# Patient Record
Sex: Female | Born: 1945 | Race: White | Hispanic: No | Marital: Married | Smoking: Never smoker
Health system: Southern US, Community
[De-identification: ages and names within clinical notes are randomized; demographics above are authoritative.]

## PROBLEM LIST (undated history)

## (undated) ENCOUNTER — Emergency Department (HOSPITAL_BASED_OUTPATIENT_CLINIC_OR_DEPARTMENT_OTHER): Source: Home / Self Care

## (undated) DIAGNOSIS — T7840XA Allergy, unspecified, initial encounter: Secondary | ICD-10-CM

## (undated) DIAGNOSIS — G4700 Insomnia, unspecified: Secondary | ICD-10-CM

## (undated) DIAGNOSIS — Z8719 Personal history of other diseases of the digestive system: Secondary | ICD-10-CM

## (undated) DIAGNOSIS — I10 Essential (primary) hypertension: Secondary | ICD-10-CM

## (undated) DIAGNOSIS — J45909 Unspecified asthma, uncomplicated: Secondary | ICD-10-CM

## (undated) DIAGNOSIS — E039 Hypothyroidism, unspecified: Secondary | ICD-10-CM

## (undated) DIAGNOSIS — I499 Cardiac arrhythmia, unspecified: Secondary | ICD-10-CM

## (undated) DIAGNOSIS — K589 Irritable bowel syndrome without diarrhea: Secondary | ICD-10-CM

## (undated) DIAGNOSIS — Z9889 Other specified postprocedural states: Secondary | ICD-10-CM

## (undated) DIAGNOSIS — M436 Torticollis: Secondary | ICD-10-CM

## (undated) DIAGNOSIS — E785 Hyperlipidemia, unspecified: Secondary | ICD-10-CM

## (undated) DIAGNOSIS — K219 Gastro-esophageal reflux disease without esophagitis: Secondary | ICD-10-CM

## (undated) DIAGNOSIS — D649 Anemia, unspecified: Secondary | ICD-10-CM

## (undated) HISTORY — DX: Gastro-esophageal reflux disease without esophagitis: K21.9

## (undated) HISTORY — PX: CLEFT PALATE REPAIR: SUR1165

## (undated) HISTORY — DX: Unspecified asthma, uncomplicated: J45.909

## (undated) HISTORY — DX: Hypothyroidism, unspecified: E03.9

## (undated) HISTORY — DX: Cardiac arrhythmia, unspecified: I49.9

## (undated) HISTORY — DX: Insomnia, unspecified: G47.00

## (undated) HISTORY — DX: Essential (primary) hypertension: I10

## (undated) HISTORY — DX: Anemia, unspecified: D64.9

## (undated) HISTORY — DX: Other specified postprocedural states: Z98.890

## (undated) HISTORY — DX: Personal history of other diseases of the digestive system: Z87.19

## (undated) HISTORY — DX: Allergy, unspecified, initial encounter: T78.40XA

## (undated) HISTORY — PX: ABDOMINAL HYSTERECTOMY: SHX81

## (undated) HISTORY — DX: Hyperlipidemia, unspecified: E78.5

## (undated) HISTORY — PX: COLONOSCOPY: SHX174

## (undated) HISTORY — PX: CATARACT EXTRACTION: SUR2

## (undated) HISTORY — DX: Torticollis: M43.6

## (undated) HISTORY — DX: Irritable bowel syndrome, unspecified: K58.9

---

## 1983-07-20 HISTORY — PX: TUBAL LIGATION: SHX77

## 1997-12-30 ENCOUNTER — Ambulatory Visit (HOSPITAL_COMMUNITY): Admission: RE | Admit: 1997-12-30 | Discharge: 1997-12-30 | Payer: Self-pay | Admitting: Pulmonary Disease

## 1999-02-09 ENCOUNTER — Encounter: Payer: Self-pay | Admitting: Pulmonary Disease

## 1999-02-09 ENCOUNTER — Ambulatory Visit (HOSPITAL_COMMUNITY): Admission: RE | Admit: 1999-02-09 | Discharge: 1999-02-09 | Payer: Self-pay | Admitting: Pulmonary Disease

## 2000-02-15 ENCOUNTER — Encounter: Payer: Self-pay | Admitting: Pulmonary Disease

## 2000-02-15 ENCOUNTER — Ambulatory Visit (HOSPITAL_COMMUNITY): Admission: RE | Admit: 2000-02-15 | Discharge: 2000-02-15 | Payer: Self-pay | Admitting: Pulmonary Disease

## 2001-02-20 ENCOUNTER — Ambulatory Visit (HOSPITAL_COMMUNITY): Admission: RE | Admit: 2001-02-20 | Discharge: 2001-02-20 | Payer: Self-pay | Admitting: Internal Medicine

## 2001-02-20 ENCOUNTER — Encounter: Payer: Self-pay | Admitting: Internal Medicine

## 2003-02-25 ENCOUNTER — Encounter: Payer: Self-pay | Admitting: Gastroenterology

## 2003-02-25 ENCOUNTER — Ambulatory Visit (HOSPITAL_COMMUNITY): Admission: RE | Admit: 2003-02-25 | Discharge: 2003-02-25 | Payer: Self-pay | Admitting: Gastroenterology

## 2004-05-20 ENCOUNTER — Ambulatory Visit: Payer: Self-pay | Admitting: Gastroenterology

## 2004-07-19 DIAGNOSIS — M436 Torticollis: Secondary | ICD-10-CM

## 2004-07-19 HISTORY — DX: Torticollis: M43.6

## 2004-07-30 ENCOUNTER — Ambulatory Visit: Payer: Self-pay | Admitting: Gastroenterology

## 2004-08-12 ENCOUNTER — Ambulatory Visit: Payer: Self-pay | Admitting: Gastroenterology

## 2004-08-13 ENCOUNTER — Ambulatory Visit (HOSPITAL_COMMUNITY): Admission: RE | Admit: 2004-08-13 | Discharge: 2004-08-13 | Payer: Self-pay | Admitting: Internal Medicine

## 2005-04-21 ENCOUNTER — Ambulatory Visit: Payer: Self-pay | Admitting: Internal Medicine

## 2005-04-29 ENCOUNTER — Ambulatory Visit: Payer: Self-pay | Admitting: Internal Medicine

## 2005-05-14 ENCOUNTER — Ambulatory Visit: Payer: Self-pay | Admitting: Family Medicine

## 2005-07-15 ENCOUNTER — Ambulatory Visit: Payer: Self-pay | Admitting: Internal Medicine

## 2005-08-25 ENCOUNTER — Ambulatory Visit: Payer: Self-pay | Admitting: Internal Medicine

## 2005-09-21 ENCOUNTER — Ambulatory Visit: Payer: Self-pay | Admitting: Gastroenterology

## 2005-10-29 ENCOUNTER — Ambulatory Visit: Payer: Self-pay | Admitting: Gastroenterology

## 2005-11-22 ENCOUNTER — Ambulatory Visit: Payer: Self-pay | Admitting: Internal Medicine

## 2006-05-17 ENCOUNTER — Ambulatory Visit: Payer: Self-pay | Admitting: Internal Medicine

## 2006-05-17 LAB — CONVERTED CEMR LAB
ALT: 18 units/L (ref 0–40)
AST: 21 units/L (ref 0–37)
Albumin: 3.8 g/dL (ref 3.5–5.2)
Alkaline Phosphatase: 96 units/L (ref 39–117)
BUN: 18 mg/dL (ref 6–23)
Bacteria, U Microscopic: NEGATIVE /hpf
Basophils Absolute: 0.1 10*3/uL (ref 0.0–0.1)
Basophils Relative: 0.8 % (ref 0.0–1.0)
Bilirubin Urine: NEGATIVE
CO2: 29 meq/L (ref 19–32)
Calcium: 9.5 mg/dL (ref 8.4–10.5)
Chloride: 104 meq/L (ref 96–112)
Chol/HDL Ratio, serum: 3.5
Cholesterol: 180 mg/dL (ref 0–200)
Creatinine, Ser: 0.8 mg/dL (ref 0.4–1.2)
Crystals: NEGATIVE
Eosinophil percent: 4.3 % (ref 0.0–5.0)
GFR calc non Af Amer: 78 mL/min
Glomerular Filtration Rate, Af Am: 94 mL/min/{1.73_m2}
Glucose, Bld: 86 mg/dL (ref 70–99)
HCT: 39 % (ref 36.0–46.0)
HDL: 50.9 mg/dL (ref >39.0)
Hemoglobin, Urine: NEGATIVE
Hemoglobin: 13.2 g/dL (ref 12.0–15.0)
Ketones, ur: NEGATIVE mg/dL
LDL Cholesterol: 115 mg/dL — ABNORMAL HIGH (ref 0–99)
Lymphocytes Relative: 29.9 % (ref 12.0–46.0)
MCHC: 33.8 g/dL (ref 30.0–36.0)
MCV: 91.6 fL (ref 78.0–100.0)
Monocytes Absolute: 0.8 10*3/uL — ABNORMAL HIGH (ref 0.2–0.7)
Monocytes Relative: 9.2 % (ref 3.0–11.0)
Mucus, UA: NEGATIVE
Neutro Abs: 4.7 10*3/uL (ref 1.4–7.7)
Neutrophils Relative %: 55.8 % (ref 43.0–77.0)
Nitrite: NEGATIVE
Platelets: 355 10*3/uL (ref 150–400)
Potassium: 4.3 meq/L (ref 3.5–5.1)
RBC / HPF: NONE SEEN
RBC: 4.26 M/uL (ref 3.87–5.11)
RDW: 12.9 % (ref 11.5–14.6)
Sodium: 140 meq/L (ref 135–145)
Specific Gravity, Urine: 1.015 (ref 1.000–1.03)
TSH: 3.3 microintl units/mL (ref 0.35–5.50)
Total Bilirubin: 0.6 mg/dL (ref 0.3–1.2)
Total Protein, Urine: NEGATIVE mg/dL
Total Protein: 6.9 g/dL (ref 6.0–8.3)
Triglyceride fasting, serum: 72 mg/dL (ref 0–149)
Urine Glucose: NEGATIVE mg/dL
Urobilinogen, UA: 0.2 (ref 0.0–1.0)
VLDL: 14 mg/dL (ref 0–40)
WBC: 8.5 10*3/uL (ref 4.5–10.5)
pH: 6.5 (ref 5.0–8.0)

## 2006-05-25 ENCOUNTER — Ambulatory Visit: Payer: Self-pay | Admitting: Internal Medicine

## 2006-10-25 ENCOUNTER — Ambulatory Visit: Payer: Self-pay | Admitting: Gastroenterology

## 2007-07-17 ENCOUNTER — Encounter: Payer: Self-pay | Admitting: Internal Medicine

## 2007-07-17 ENCOUNTER — Ambulatory Visit: Payer: Self-pay | Admitting: Internal Medicine

## 2007-07-20 DIAGNOSIS — J45909 Unspecified asthma, uncomplicated: Secondary | ICD-10-CM

## 2007-07-20 HISTORY — DX: Unspecified asthma, uncomplicated: J45.909

## 2007-08-01 ENCOUNTER — Encounter: Payer: Self-pay | Admitting: Internal Medicine

## 2007-08-18 ENCOUNTER — Encounter: Payer: Self-pay | Admitting: Internal Medicine

## 2008-02-08 ENCOUNTER — Telehealth: Payer: Self-pay | Admitting: Internal Medicine

## 2008-03-08 ENCOUNTER — Ambulatory Visit: Payer: Self-pay | Admitting: Internal Medicine

## 2008-03-08 LAB — CONVERTED CEMR LAB
ALT: 14 units/L (ref 0–35)
AST: 22 units/L (ref 0–37)
Albumin: 3.7 g/dL (ref 3.5–5.2)
Alkaline Phosphatase: 94 units/L (ref 39–117)
BUN: 19 mg/dL (ref 6–23)
Basophils Absolute: 0.1 10*3/uL (ref 0.0–0.1)
Basophils Relative: 1.4 % (ref 0.0–3.0)
Bilirubin Urine: NEGATIVE
Bilirubin, Direct: 0.1 mg/dL (ref 0.0–0.3)
CO2: 28 meq/L (ref 19–32)
Calcium: 9.1 mg/dL (ref 8.4–10.5)
Chloride: 106 meq/L (ref 96–112)
Cholesterol: 178 mg/dL (ref 0–200)
Creatinine, Ser: 0.7 mg/dL (ref 0.4–1.2)
Eosinophils Absolute: 0.2 10*3/uL (ref 0.0–0.7)
Eosinophils Relative: 2.3 % (ref 0.0–5.0)
GFR calc Af Amer: 109 mL/min
GFR calc non Af Amer: 90 mL/min
Glucose, Bld: 92 mg/dL (ref 70–99)
HCT: 37.8 % (ref 36.0–46.0)
HDL: 47.1 mg/dL (ref >39.0)
Hemoglobin, Urine: NEGATIVE
Hemoglobin: 12.9 g/dL (ref 12.0–15.0)
Ketones, ur: NEGATIVE mg/dL
LDL Cholesterol: 116 mg/dL — ABNORMAL HIGH (ref 0–99)
Leukocytes, UA: NEGATIVE
Lymphocytes Relative: 34.7 % (ref 12.0–46.0)
MCHC: 34.2 g/dL (ref 30.0–36.0)
MCV: 92.4 fL (ref 78.0–100.0)
Monocytes Absolute: 0.7 10*3/uL (ref 0.1–1.0)
Monocytes Relative: 9.2 % (ref 3.0–12.0)
Neutro Abs: 3.8 10*3/uL (ref 1.4–7.7)
Neutrophils Relative %: 52.4 % (ref 43.0–77.0)
Nitrite: NEGATIVE
Platelets: 310 10*3/uL (ref 150–400)
Potassium: 3.9 meq/L (ref 3.5–5.1)
RBC: 4.1 M/uL (ref 3.87–5.11)
RDW: 12.5 % (ref 11.5–14.6)
Sodium: 141 meq/L (ref 135–145)
Specific Gravity, Urine: 1.015 (ref 1.000–1.03)
TSH: 0.92 microintl units/mL (ref 0.35–5.50)
Total Bilirubin: 0.7 mg/dL (ref 0.3–1.2)
Total CHOL/HDL Ratio: 3.8
Total Protein, Urine: NEGATIVE mg/dL
Total Protein: 6.6 g/dL (ref 6.0–8.3)
Triglycerides: 73 mg/dL (ref 0–149)
Urine Glucose: NEGATIVE mg/dL
Urobilinogen, UA: 0.2 (ref 0.0–1.0)
VLDL: 15 mg/dL (ref 0–40)
WBC: 7.4 10*3/uL (ref 4.5–10.5)
pH: 6 (ref 5.0–8.0)

## 2008-03-15 ENCOUNTER — Ambulatory Visit: Payer: Self-pay | Admitting: Internal Medicine

## 2008-03-15 DIAGNOSIS — E039 Hypothyroidism, unspecified: Secondary | ICD-10-CM | POA: Insufficient documentation

## 2008-03-15 DIAGNOSIS — J45909 Unspecified asthma, uncomplicated: Secondary | ICD-10-CM | POA: Insufficient documentation

## 2008-03-15 DIAGNOSIS — J309 Allergic rhinitis, unspecified: Secondary | ICD-10-CM | POA: Insufficient documentation

## 2008-03-15 DIAGNOSIS — G4700 Insomnia, unspecified: Secondary | ICD-10-CM | POA: Insufficient documentation

## 2008-03-15 DIAGNOSIS — E785 Hyperlipidemia, unspecified: Secondary | ICD-10-CM | POA: Insufficient documentation

## 2008-03-15 DIAGNOSIS — K219 Gastro-esophageal reflux disease without esophagitis: Secondary | ICD-10-CM | POA: Insufficient documentation

## 2008-04-02 ENCOUNTER — Telehealth: Payer: Self-pay | Admitting: Internal Medicine

## 2008-05-31 ENCOUNTER — Emergency Department (HOSPITAL_COMMUNITY): Admission: EM | Admit: 2008-05-31 | Discharge: 2008-05-31 | Payer: Self-pay | Admitting: Emergency Medicine

## 2008-07-17 ENCOUNTER — Encounter: Payer: Self-pay | Admitting: Internal Medicine

## 2008-09-13 ENCOUNTER — Ambulatory Visit: Payer: Self-pay | Admitting: Internal Medicine

## 2008-09-13 LAB — CONVERTED CEMR LAB
ALT: 16 units/L (ref 0–35)
AST: 23 units/L (ref 0–37)
Cholesterol: 243 mg/dL (ref 0–200)
Direct LDL: 158.6 mg/dL
HDL: 54.2 mg/dL (ref >39.0)
TSH: 3.31 microintl units/mL (ref 0.35–5.50)
Total CHOL/HDL Ratio: 4.5
Triglycerides: 108 mg/dL (ref 0–149)
VLDL: 22 mg/dL (ref 0–40)

## 2009-01-07 ENCOUNTER — Encounter: Payer: Self-pay | Admitting: Internal Medicine

## 2009-03-17 ENCOUNTER — Ambulatory Visit: Payer: Self-pay | Admitting: Internal Medicine

## 2009-03-17 LAB — CONVERTED CEMR LAB
ALT: 17 units/L (ref 0–35)
AST: 23 units/L (ref 0–37)
Albumin: 4.2 g/dL (ref 3.5–5.2)
Alkaline Phosphatase: 112 units/L (ref 39–117)
BUN: 24 mg/dL — ABNORMAL HIGH (ref 6–23)
Basophils Absolute: 0 10*3/uL (ref 0.0–0.1)
Basophils Relative: 0.1 % (ref 0.0–3.0)
Bilirubin Urine: NEGATIVE
Bilirubin, Direct: 0 mg/dL (ref 0.0–0.3)
CO2: 32 meq/L (ref 19–32)
Calcium: 9.6 mg/dL (ref 8.4–10.5)
Chloride: 103 meq/L (ref 96–112)
Cholesterol: 218 mg/dL — ABNORMAL HIGH (ref 0–200)
Creatinine, Ser: 0.8 mg/dL (ref 0.4–1.2)
Direct LDL: 137.5 mg/dL
Eosinophils Absolute: 0.1 10*3/uL (ref 0.0–0.7)
Eosinophils Relative: 2 % (ref 0.0–5.0)
GFR calc non Af Amer: 76.83 mL/min (ref >60)
Glucose, Bld: 89 mg/dL (ref 70–99)
HCT: 40.5 % (ref 36.0–46.0)
HDL: 49.3 mg/dL (ref >39.00)
Hemoglobin, Urine: NEGATIVE
Hemoglobin: 13.6 g/dL (ref 12.0–15.0)
Ketones, ur: NEGATIVE mg/dL
Leukocytes, UA: NEGATIVE
Lymphocytes Relative: 29.6 % (ref 12.0–46.0)
Lymphs Abs: 2 10*3/uL (ref 0.7–4.0)
MCHC: 33.5 g/dL (ref 30.0–36.0)
MCV: 92.8 fL (ref 78.0–100.0)
Monocytes Absolute: 0.5 10*3/uL (ref 0.1–1.0)
Monocytes Relative: 7.7 % (ref 3.0–12.0)
Neutro Abs: 4.3 10*3/uL (ref 1.4–7.7)
Neutrophils Relative %: 60.6 % (ref 43.0–77.0)
Nitrite: NEGATIVE
Platelets: 266 10*3/uL (ref 150.0–400.0)
Potassium: 4.9 meq/L (ref 3.5–5.1)
RBC: 4.37 M/uL (ref 3.87–5.11)
RDW: 12.3 % (ref 11.5–14.6)
Sodium: 140 meq/L (ref 135–145)
Specific Gravity, Urine: <=1.005 (ref 1.000–1.030)
TSH: 2.97 microintl units/mL (ref 0.35–5.50)
Total Bilirubin: 0.9 mg/dL (ref 0.3–1.2)
Total CHOL/HDL Ratio: 4
Total Protein, Urine: NEGATIVE mg/dL
Total Protein: 8.2 g/dL (ref 6.0–8.3)
Triglycerides: 91 mg/dL (ref 0.0–149.0)
Urine Glucose: NEGATIVE mg/dL
Urobilinogen, UA: 0.2 (ref 0.0–1.0)
VLDL: 18.2 mg/dL (ref 0.0–40.0)
WBC: 6.9 10*3/uL (ref 4.5–10.5)
pH: 6.5 (ref 5.0–8.0)

## 2009-03-18 ENCOUNTER — Ambulatory Visit: Payer: Self-pay | Admitting: Internal Medicine

## 2009-03-26 ENCOUNTER — Telehealth: Payer: Self-pay | Admitting: Internal Medicine

## 2009-04-30 ENCOUNTER — Emergency Department (HOSPITAL_COMMUNITY): Admission: EM | Admit: 2009-04-30 | Discharge: 2009-04-30 | Payer: Self-pay | Admitting: Family Medicine

## 2009-09-01 ENCOUNTER — Telehealth: Payer: Self-pay | Admitting: Internal Medicine

## 2009-09-15 ENCOUNTER — Ambulatory Visit: Payer: Self-pay | Admitting: Internal Medicine

## 2009-10-20 ENCOUNTER — Ambulatory Visit: Payer: Self-pay | Admitting: Internal Medicine

## 2009-10-20 DIAGNOSIS — M2550 Pain in unspecified joint: Secondary | ICD-10-CM | POA: Insufficient documentation

## 2009-10-20 DIAGNOSIS — J069 Acute upper respiratory infection, unspecified: Secondary | ICD-10-CM | POA: Insufficient documentation

## 2009-10-20 DIAGNOSIS — R531 Weakness: Secondary | ICD-10-CM | POA: Insufficient documentation

## 2009-10-23 LAB — CONVERTED CEMR LAB
ALT: 25 units/L (ref 0–35)
AST: 25 units/L (ref 0–37)
Albumin: 3.8 g/dL (ref 3.5–5.2)
Alkaline Phosphatase: 115 units/L (ref 39–117)
BUN: 18 mg/dL (ref 6–23)
Basophils Absolute: 0 10*3/uL (ref 0.0–0.1)
Basophils Relative: 0.5 % (ref 0.0–3.0)
Bilirubin Urine: NEGATIVE
Bilirubin, Direct: 0 mg/dL (ref 0.0–0.3)
CO2: 29 meq/L (ref 19–32)
Calcium: 9 mg/dL (ref 8.4–10.5)
Chloride: 103 meq/L (ref 96–112)
Creatinine, Ser: 0.8 mg/dL (ref 0.4–1.2)
Eosinophils Absolute: 0.2 10*3/uL (ref 0.0–0.7)
Eosinophils Relative: 1.7 % (ref 0.0–5.0)
GFR calc non Af Amer: 76.68 mL/min (ref >60)
Glucose, Bld: 97 mg/dL (ref 70–99)
HCT: 35.3 % — ABNORMAL LOW (ref 36.0–46.0)
Hemoglobin: 12.2 g/dL (ref 12.0–15.0)
Ketones, ur: NEGATIVE mg/dL
Lymphocytes Relative: 21.2 % (ref 12.0–46.0)
Lymphs Abs: 2 10*3/uL (ref 0.7–4.0)
MCHC: 34.7 g/dL (ref 30.0–36.0)
MCV: 91.1 fL (ref 78.0–100.0)
Monocytes Absolute: 1 10*3/uL (ref 0.1–1.0)
Monocytes Relative: 10.7 % (ref 3.0–12.0)
Neutro Abs: 6.3 10*3/uL (ref 1.4–7.7)
Neutrophils Relative %: 65.9 % (ref 43.0–77.0)
Nitrite: NEGATIVE
Platelets: 272 10*3/uL (ref 150.0–400.0)
Potassium: 4.4 meq/L (ref 3.5–5.1)
RBC: 3.87 M/uL (ref 3.87–5.11)
RDW: 13 % (ref 11.5–14.6)
Sed Rate: 45 mm/hr — ABNORMAL HIGH (ref 0–22)
Sodium: 141 meq/L (ref 135–145)
Specific Gravity, Urine: >=1.03 (ref 1.000–1.030)
TSH: 1.81 microintl units/mL (ref 0.35–5.50)
Total Bilirubin: 0.4 mg/dL (ref 0.3–1.2)
Total CK: 68 units/L (ref 7–177)
Total Protein, Urine: NEGATIVE mg/dL
Total Protein: 7.2 g/dL (ref 6.0–8.3)
Urine Glucose: NEGATIVE mg/dL
Urobilinogen, UA: 1 (ref 0.0–1.0)
Vitamin B-12: 837 pg/mL (ref 211–911)
WBC: 9.5 10*3/uL (ref 4.5–10.5)
pH: 5.5 (ref 5.0–8.0)

## 2010-01-07 ENCOUNTER — Encounter: Payer: Self-pay | Admitting: Internal Medicine

## 2010-03-17 ENCOUNTER — Ambulatory Visit: Payer: Self-pay | Admitting: Internal Medicine

## 2010-03-17 ENCOUNTER — Ambulatory Visit: Payer: Self-pay | Admitting: Family Medicine

## 2010-03-24 ENCOUNTER — Encounter: Payer: Self-pay | Admitting: Internal Medicine

## 2010-03-24 ENCOUNTER — Ambulatory Visit: Payer: Self-pay | Admitting: Internal Medicine

## 2010-03-24 DIAGNOSIS — M25559 Pain in unspecified hip: Secondary | ICD-10-CM | POA: Insufficient documentation

## 2010-03-30 ENCOUNTER — Telehealth: Payer: Self-pay | Admitting: Internal Medicine

## 2010-03-31 ENCOUNTER — Encounter: Payer: Self-pay | Admitting: Gastroenterology

## 2010-04-03 ENCOUNTER — Telehealth: Payer: Self-pay | Admitting: Gastroenterology

## 2010-08-16 LAB — CONVERTED CEMR LAB
AST: 21 units/L (ref 0–37)
Albumin: 3.9 g/dL (ref 3.5–5.2)
Alkaline Phosphatase: 105 units/L (ref 39–117)
Basophils Absolute: 0.1 10*3/uL (ref 0.0–0.1)
Calcium: 9.4 mg/dL (ref 8.4–10.5)
Eosinophils Relative: 2.7 % (ref 0.0–5.0)
GFR calc non Af Amer: 85.12 mL/min (ref >60)
Glucose, Bld: 80 mg/dL (ref 70–99)
HDL: 50.1 mg/dL (ref >39.00)
Hemoglobin: 12.4 g/dL (ref 12.0–15.0)
Ketones, ur: NEGATIVE mg/dL
LDL Cholesterol: 127 mg/dL — ABNORMAL HIGH (ref 0–99)
Lymphocytes Relative: 30.1 % (ref 12.0–46.0)
Monocytes Relative: 9.3 % (ref 3.0–12.0)
Neutro Abs: 4.5 10*3/uL (ref 1.4–7.7)
RBC: 3.91 M/uL (ref 3.87–5.11)
RDW: 14 % (ref 11.5–14.6)
Sodium: 141 meq/L (ref 135–145)
Specific Gravity, Urine: 1.015 (ref 1.000–1.030)
Total Bilirubin: 0.5 mg/dL (ref 0.3–1.2)
Total CHOL/HDL Ratio: 4
Urine Glucose: NEGATIVE mg/dL
VLDL: 16 mg/dL (ref 0.0–40.0)
WBC: 7.9 10*3/uL (ref 4.5–10.5)
pH: 7 (ref 5.0–8.0)

## 2010-08-20 NOTE — Miscellaneous (Signed)
Summary: BONE DENSITY  Clinical Lists Changes  Orders: Added new Test order of T-Bone Densitometry (77080) - Signed Added new Test order of T-Lumbar Vertebral Assessment (77082) - Signed 

## 2010-08-20 NOTE — Letter (Signed)
Summary: Pre Visit Letter Revised  [facility]  [address] [place], [place] [address]   Phone: [phone]  Fax: [phone]        03/31/2010 MRN: [id number] Cindy Simpson [address], [place]  [address]             Procedure Date:  Oct 31 at 9:30am   Welcome to the Gastroenterology Division at [facility].    You are scheduled to see a nurse for your pre-procedure visit on May 01, 2010 at 10:30am on the 3rd floor at [facility], [address]  We ask that you try to arrive at our office 15 minutes prior to your appointment time to allow for check-in.  Please take a minute to review the attached form.  If you answer "Yes" to one or more of the questions on the first page, we ask that you call the person listed at your earliest opportunity.  If you answer "No" to all of the questions, please complete the rest of the form and bring it to your appointment.    Your nurse visit will consist of discussing your medical and surgical history, your immediate family medical history, and your medications.   If you are unable to list all of your medications on the form, please bring the medication bottles to your appointment and we will list them.  We will need to be aware of both prescribed and over the counter drugs.  We will need to know exact dosage information as well.    Please be prepared to read and sign documents such as consent forms, a financial agreement, and acknowledgement forms.  If necessary, and with your consent, a friend or relative is welcome to sit-in on the nurse visit with you.  Please bring your insurance card so that we may make a copy of it.  If your insurance requires a referral to see a specialist, please bring your referral form from your primary care physician.  No co-pay is required for this nurse visit.     If you cannot keep your appointment, please call [phone] to cancel or reschedule prior to your appointment  date.  This allows [place] the opportunity to schedule an appointment for another patient in need of care.    Thank you for choosing [facility] for your medical needs.  We appreciate the opportunity to care for you.  Please visit [place] at our website  to learn more about our practice.  Sincerely, The Gastroenterology Division

## 2010-08-20 NOTE — Therapy (Signed)
Summary: [facility]   [facility]   Imported By: Sherian Rein 01/26/2010 11:51:30  _____________________________________________________________________  External Attachment:    Type:   Image     Comment:   External Document

## 2010-08-20 NOTE — Assessment & Plan Note (Signed)
Summary: CONGESTION/HEAD  STC   Vital Signs:  Patient profile:   65 year old female Height:      63 inches Weight:      161.75 pounds BMI:     28.76 O2 Sat:      96 % on Room air Temp:     97.2 degrees F oral Pulse rate:   78 / minute BP sitting:   104 / 60  (left arm) Cuff size:   large  Vitals Entered By: Lucious Groves (October 20, 2009 2:47 PM)  O2 Flow:  Room air CC: C/O congestion/head cold x 4 day swith sore throat./kb Is Patient Diabetic? No Pain Assessment Patient in pain? no      Comments Patient denies mucous production and states that OTC [facility] cold med has not helped./kb   CC:  C/O congestion/head cold x 4 day swith sore throat./kb.  History of Present Illness: The patient presents with complaints of sore throat, fever,Muscle aches are present.  The mucus is colored. C/o fatigue, aches, R shoulder pain x 3-4 d   Current Medications (verified): 1)  Lovastatin 40 Mg  Tabs (Lovastatin) .... Once Daily 2)  Cartia Xt 240 Mg  Cp24 (Diltiazem Hcl Coated Beads) .... Once Daily 3)  Unithroid 75 Mcg  Tabs (Levothyroxine Sodium) .... Once Daily 4)  Vitamin D3 1000 Unit  Tabs (Cholecalciferol) .Marland Kitchen.. 1 By Mouth Daily 5)  Alprazolam 0.25 Mg Tabs (Alprazolam) .Marland Kitchen.. 1 By Mouth At [facility] Prn 6)  Aspirin 325 Mg Tabs (Aspirin) .... Once Daily 7)  Calcium 600 600 Mg Tabs (Calcium Carbonate) .... Once Daily 8)  Omnaris 50 Mcg/act Susp (Ciclesonide) .Marland Kitchen.. 1 Inh Once Daily Each Nostr 9)  Omeprazole 40 Mg Cpdr (Omeprazole) .Marland Kitchen.. 1 By Mouth Bid  Allergies (verified): 1)  ! Reglan (Metoclopramide Hcl)  Past History:  Past Medical History: Last updated: 03/15/2008 GERD Hyperlipidemia Hypothyroidism Allergic rhinitis IBS Asthmatic bronchitis 2009 Insomnia  Social History: Last updated: 10/20/2009 Married Never Smoked Retired  Social History: Married Never Smoked Retired  Review of Systems       The patient complains of dyspnea on exertion.  The patient denies fever, chest  pain, and syncope.         Fatigue  Physical Exam  General:  Well-developed,well-nourished,in no acute distress; alert,appropriate and cooperative throughout examination Ears:  External ear exam shows no significant lesions or deformities.  Otoscopic examination reveals clear canals, tympanic membranes are intact bilaterally without bulging, retraction, inflammation or discharge. Hearing is grossly normal bilaterally. Mouth:  Erythematous throat mucosa and intranasal erythema.  Neck:  No deformities, masses, or tenderness noted. Lungs:  Normal respiratory effort, chest expands symmetrically. Lungs are clear to auscultation, no crackles or wheezes. Heart:  Normal rate and regular rhythm. S1 and S2 normal without gallop, murmur, click, rub or other extra sounds. Abdomen:  Bowel sounds positive,abdomen soft and non-tender without masses, organomegaly or hernias noted. Msk:  R subacrom area is tender Neurologic:  No cranial nerve deficits noted. Station and gait are normal. Plantar reflexes are down-going bilaterally. DTRs are symmetrical throughout. Sensory, motor and coordinative functions appear intact. Skin:  Intact without suspicious lesions or rashes Psych:  Cognition and judgment appear intact. Alert and cooperative with normal attention span and concentration. No apparent delusions, illusions, hallucinations   Impression & Recommendations:  Problem # 1:  SHOULDER PAIN (ICD-719.41) R - subacr bursitis Assessment New Will inject if not better Her updated medication list for this problem includes:  Aspirin 325 Mg Tabs (Aspirin) ..... Once daily  Problem # 2:  ARTHRALGIA (ICD-719.40) Assessment: New See "Patient Instructions".  Orders: TLB-B12, Serum-Total ONLY (51884-Z66) TLB-BMP (Basic Metabolic Panel-BMET) (80048-METABOL) TLB-Hepatic/Liver Function Pnl (80076-HEPATIC) TLB-TSH (Thyroid Stimulating Hormone) (84443-TSH) TLB-Udip ONLY (81003-UDIP) T-CK Total  (06301-60109) TLB-CBC Platelet - w/Differential (85025-CBCD) TLB-Sedimentation Rate (ESR) (85652-ESR)  Problem # 3:  FATIGUE (ICD-780.79) Assessment: Deteriorated  Orders: TLB-B12, Serum-Total ONLY (32355-D32) TLB-BMP (Basic Metabolic Panel-BMET) (80048-METABOL) TLB-Hepatic/Liver Function Pnl (80076-HEPATIC) TLB-TSH (Thyroid Stimulating Hormone) (84443-TSH) TLB-Udip ONLY (81003-UDIP) T-CK Total (20254-27062) TLB-CBC Platelet - w/Differential (85025-CBCD) TLB-Sedimentation Rate (ESR) (85652-ESR)  Problem # 4:  UPPER RESPIRATORY INFECTION, ACUTE (ICD-465.9) Assessment: New  Her updated medication list for this problem includes:    Aspirin 325 Mg Tabs (Aspirin) ..... Once daily    Loratadine 10 Mg Tabs (Loratadine) .Marland Kitchen... 1 by mouth once daily as needed allergies  Complete Medication List: 1)  Lovastatin 40 Mg Tabs (Lovastatin) .... Once daily 2)  Cartia Xt 240 Mg Cp24 (Diltiazem hcl coated beads) .... Once daily 3)  Unithroid 75 Mcg Tabs (Levothyroxine sodium) .... Once daily 4)  Vitamin D3 1000 Unit Tabs (Cholecalciferol) .Marland Kitchen.. 1 by mouth daily 5)  Alprazolam 0.25 Mg Tabs (Alprazolam) .Marland Kitchen.. 1 by mouth at hs prn 6)  Aspirin 325 Mg Tabs (Aspirin) .... Once daily 7)  Calcium 600 600 Mg Tabs (Calcium carbonate) .... Once daily 8)  Omnaris 50 Mcg/act Susp (Ciclesonide) .Marland Kitchen.. 1 inh once daily each nostr 9)  Omeprazole 40 Mg Cpdr (Omeprazole) .Marland Kitchen.. 1 by mouth bid 10)  Loratadine 10 Mg Tabs (Loratadine) .Marland Kitchen.. 1 by mouth once daily as needed allergies 11)  Zithromax Z-pak 250 Mg Tabs (Azithromycin) .... As dirrected  Patient Instructions: 1)  Hold Lovastatin x 1 -2 mo   - it can make you ache 2)  Please schedule a follow-up appointment in 2 months. Prescriptions: ZITHROMAX Z-PAK 250 MG TABS (AZITHROMYCIN) as dirrected  #1 x 0   Entered and Authorized by:   Tresa Garter MD   Signed by:   Tresa Garter MD on 10/20/2009   Method used:   [facility]        [phone][facility] (retail)       [address]       [place], [place]  [address]       Ph: [phone] or [phone]       Fax: [phone]   RxID:   [id number] LORATADINE 10 MG TABS (LORATADINE) 1 by mouth once daily as needed allergies  #30 x 6   Entered and Authorized by:   Tresa Garter MD   Signed by:   Tresa Garter MD on 10/20/2009   Method used:   [facility] to        [phone][facility] (retail)       [address]       [place], [place]  [address]       Ph: [phone] or [phone]       Fax: [phone]   RxID:   [id number]   Appended Document: Orders Update    Clinical Lists Changes  Orders: Added new Test order of TLB-CK Total Only(Creatine Kinase/CPK) (82550-CK) - Signed

## 2010-08-20 NOTE — Assessment & Plan Note (Signed)
Summary: PER PT TETNUS -D/T--AVP--STC  Nurse Visit   Allergies: 1)  ! Reglan (Metoclopramide Hcl)  Immunizations Administered:  Tetanus Vaccine:    Vaccine Type: Tdap    Site: left deltoid    Mfr: GlaxoSmithKline    Dose: 0.5 ml    Route: IM    Given by: Lamar Sprinkles, CMA    Exp. Date: 10/11/2011    Lot #: [id number]    VIS given: 06/06/07 version given September 15, 2009.  Orders Added: 1)  Tdap => 80yrs IM [90715] 2)  Admin 1st Vaccine [65784]

## 2010-08-20 NOTE — Progress Notes (Signed)
Summary: Colonoscopy  Phone Note Outgoing Call   Call placed by: Lanier Prude, [facility]),  March 30, 2010 11:41 AM Summary of Call: pt advised last colon was 02/2001 and repeat was due 2007. I informed her to sched appt with Dr. Jarold Motto or we could do it for her. She states she will contact Dr. Norval Gable office. Initial call taken by: Lanier Prude, [facility]),  March 30, 2010 11:43 AM

## 2010-08-20 NOTE — Progress Notes (Signed)
Summary: COL ?'s  Phone Note Call from Patient Call back at [facility] Phone [phone]   Caller: Patient Call For: Dr. Jarold Motto Reason for Call: Talk to Nurse Summary of Call: pt has questions regarding the questionaire for previsit... asked to speak to [facility] Initial call taken by: Vallarie Mare,  April 03, 2010 2:03 PM  Follow-up for Phone Call        patient states that she called bc she checked Yes to the question of have you had a colonoscopy she has had one with Dr. Jarold Motto but i do not see a recall in the IDX so I have requested her chart and I will call her when we have an answer to if she is due for a recall.  Follow-up by: Harlow Mares CMA [facility]),  April 03, 2010 2:31 PM  Additional Follow-up for Phone Call Additional follow up Details #1::        i reviewed the chart and per Dr. Maris Berger recall sheet the patient is not due for a colonoscopy, I have notified the patient and cxed her procedure and previsit.  Additional Follow-up by: Harlow Mares CMA [facility]),  April 03, 2010 4:35 PM

## 2010-08-20 NOTE — Progress Notes (Signed)
Summary: Alprazolam  Phone Note Refill Request Message from:  Fax from Pharmacy on September 01, 2009 1:39 PM  Refills Requested: Medication #1:  ALPRAZOLAM 0.25 MG TABS 1 by mouth at hs prn   Notes[facility] [phone] Next Appointment Scheduled: 09-15-2009 Initial call taken by: Lucious Groves,  September 01, 2009 1:39 PM  Follow-up for Phone Call        OK x6 Follow-up by: Tresa Garter MD,  September 01, 2009 3:45 PM  Additional Follow-up for Phone Call Additional follow up Details #1::        rx faxed to mail order company Additional Follow-up by: Rock Nephew CMA,  September 02, 2009 2:04 PM    Prescriptions: ALPRAZOLAM 0.25 MG TABS (ALPRAZOLAM) 1 by mouth at hs prn  #90 x 1   Entered by:   Lamar Sprinkles, CMA   Authorized by:   Tresa Garter MD   Signed by:   Lamar Sprinkles, CMA on 09/01/2009   Method used:   Print then Give to Patient   RxID:   [id number]

## 2010-08-20 NOTE — Assessment & Plan Note (Signed)
Summary: CPX-LB   Vital Signs:  Patient profile:   65 year old female Height:      63 inches Weight:      158 pounds BMI:     28.09 O2 Sat:      95 % on Room air Temp:     98.6 degrees F oral Pulse rate:   73 / minute Pulse rhythm:   regular Resp:     16 per minute BP sitting:   118 / 70  (left arm) Cuff size:   regular  Vitals Entered By: Lanier Prude, CMA(AAMA) (March 24, 2010 8:32 AM)  O2 Flow:  Room air CC: CPX Is Patient Diabetic? No   CC:  CPX.  History of Present Illness: The patient presents for a preventive health examination  C/o L hip pain after a fall on Sun - tripped  Current Medications (verified): 1)  Lovastatin 40 Mg  Tabs (Lovastatin) .... Once Daily 2)  Cartia Xt 240 Mg  Cp24 (Diltiazem Hcl Coated Beads) .... Once Daily 3)  Unithroid 75 Mcg  Tabs (Levothyroxine Sodium) .... Once Daily 4)  Vitamin D3 1000 Unit  Tabs (Cholecalciferol) .Marland Kitchen.. 1 By Mouth Daily 5)  Alprazolam 0.25 Mg Tabs (Alprazolam) .Marland Kitchen.. 1 By Mouth At [facility] Prn 6)  Aspirin 325 Mg Tabs (Aspirin) .... Once Daily 7)  Calcium 600 600 Mg Tabs (Calcium Carbonate) .... Once Daily 8)  Omnaris 50 Mcg/act Susp (Ciclesonide) .Marland Kitchen.. 1 Inh Once Daily Each Nostr 9)  Omeprazole 40 Mg Cpdr (Omeprazole) .Marland Kitchen.. 1 By Mouth Bid 10)  Loratadine 10 Mg Tabs (Loratadine) .Marland Kitchen.. 1 By Mouth Once Daily As Needed Allergies  Allergies (verified): 1)  ! Reglan (Metoclopramide Hcl)  Past History:  Past Surgical History: Last updated: 03/18/2009 Hysterectomy complete  Family History: Last updated: 03/15/2008 Family History Hypertension  Social History: Last updated: 10/20/2009 Married Never Smoked Retired  Review of Systems  The patient denies anorexia, fever, weight loss, weight gain, vision loss, decreased hearing, hoarseness, chest pain, syncope, dyspnea on exertion, peripheral edema, prolonged cough, headaches, hemoptysis, abdominal pain, melena, hematochezia, severe indigestion/heartburn, hematuria,  incontinence, genital sores, muscle weakness, suspicious skin lesions, transient blindness, difficulty walking, depression, unusual weight change, abnormal bleeding, enlarged lymph nodes, angioedema, and breast masses.    Physical Exam  General:  Well-developed,well-nourished,in no acute distress; alert,appropriate and cooperative throughout examination Head:  Normocephalic and atraumatic without obvious abnormalities. No apparent alopecia or balding. Eyes:  No corneal or conjunctival inflammation noted. EOMI. Perrla. Ears:  External ear exam shows no significant lesions or deformities.  Otoscopic examination reveals clear canals, tympanic membranes are intact bilaterally without bulging, retraction, inflammation or discharge. Hearing is grossly normal bilaterally. Nose:  swollen Mouth:  Erythematous throat mucosa and intranasal erythema.  Neck:  No deformities, masses, or tenderness noted. Lungs:  Normal respiratory effort, chest expands symmetrically. Lungs are clear to auscultation, no crackles or wheezes. Heart:  Normal rate and regular rhythm. S1 and S2 normal without gallop, murmur, click, rub or other extra sounds. Abdomen:  Bowel sounds positive,abdomen soft and non-tender without masses, organomegaly or hernias noted. Msk:  R subacrom area is tender Pulses:  R and L carotid,radial,femoral,dorsalis pedis and posterior tibial pulses are full and equal bilaterally Extremities:  No clubbing, cyanosis, edema, or deformity noted with normal full range of motion of all joints.   Neurologic:  No cranial nerve deficits noted. Station and gait are normal. Plantar reflexes are down-going bilaterally. DTRs are symmetrical throughout. Sensory, motor and coordinative functions appear  intact. Skin:  Intact without suspicious lesions or rashes Cervical Nodes:  No lymphadenopathy noted Inguinal Nodes:  No significant adenopathy Psych:  Cognition and judgment appear intact. Alert and cooperative with  normal attention span and concentration. No apparent delusions, illusions, hallucinations   Impression & Recommendations:  Problem # 1:  WELL ADULT EXAM (ICD-V70.0) Assessment New The labs were reviewed with the patient.  She gets mammo and breast exam q 12 months at [facility]. Health and age related issues were discussed. Available screening tests and vaccinations were discussed as well. Healthy life style including good diet and exercise was discussed.  She had a BDS 2 wks ago Orders: EKG w/ Interpretation (93000) nl  Problem # 2:  HIP PAIN (ICD-719.45) L - contusion Assessment: New  Tylenol as needed -  Call if you are not better in a reasonable amount of time or if worse.!   Problem # 3:  HYPOTHYROIDISM (ICD-244.9) Assessment: Unchanged  Her updated medication list for this problem includes:    Unithroid 75 Mcg Tabs (Levothyroxine sodium) ..... Once daily  Problem # 4:  HYPERLIPIDEMIA (ICD-272.4) Assessment: Unchanged  Her updated medication list for this problem includes:    Lovastatin 40 Mg Tabs (Lovastatin) ..... Once daily  Complete Medication List: 1)  Lovastatin 40 Mg Tabs (Lovastatin) .... Once daily 2)  Cartia Xt 240 Mg Cp24 (Diltiazem hcl coated beads) .... Once daily 3)  Unithroid 75 Mcg Tabs (Levothyroxine sodium) .... Once daily 4)  Vitamin D3 1000 Unit Tabs (Cholecalciferol) .Marland Kitchen.. 1 by mouth daily 5)  Alprazolam 0.25 Mg Tabs (Alprazolam) .Marland Kitchen.. 1 by mouth at hs prn 6)  Aspirin 325 Mg Tabs (Aspirin) .... Once daily 7)  Calcium 600 600 Mg Tabs (Calcium carbonate) .... Once daily 8)  Omnaris 50 Mcg/act Susp (Ciclesonide) .Marland Kitchen.. 1 inh once daily each nostr 9)  Omeprazole 40 Mg Cpdr (Omeprazole) .Marland Kitchen.. 1 by mouth bid 10)  Loratadine 10 Mg Tabs (Loratadine) .Marland Kitchen.. 1 by mouth once daily as needed allergies  Other Orders: Admin 1st Vaccine (14782) Flu Vaccine 68yrs + (95621)  Patient Instructions: 1)  Please schedule a follow-up appointment in 6 months. 2)  BMP prior to  visit, ICD-9: 3)  Lipid Panel prior to visit, ICD-9: 4)  TSH prior to visit, ICD-9:272.0 244.9  Flu Vaccine Consent Questions     Do you have a history of severe allergic reactions to this vaccine? no    Any prior history of allergic reactions to egg and/or gelatin? no    Do you have a sensitivity to the preservative Thimersol? no    Do you have a past history of Guillan-Barre Syndrome? no    Do you currently have an acute febrile illness? no    Have you ever had a severe reaction to latex? no    Vaccine information given and explained to patient? yes    Are you currently pregnant? no    Lot Number:[id number]   Exp Date:01/16/2011   Site Given  Left Deltoid IM  Lanier Prude, [facility])  March 24, 2010 9:32 AM   Appended Document: CPX-LB    Clinical Lists Changes  Observations: Added new observation of COLONNXTDUE: 03/2006 (03/30/2010 11:39) Added new observation of COLONOSCOPY: Diverticulosis (03/10/2001 11:41)       Preventive Care Screening  Colonoscopy:    Date:  03/10/2001    Next Due:  03/2006    Results:  Diverticulosis

## 2010-09-16 ENCOUNTER — Other Ambulatory Visit: Payer: Self-pay

## 2010-09-16 ENCOUNTER — Encounter: Payer: Self-pay | Admitting: Internal Medicine

## 2010-09-16 LAB — HM MAMMOGRAPHY: HM Mammogram: NORMAL

## 2010-09-23 ENCOUNTER — Encounter: Payer: Self-pay | Admitting: Internal Medicine

## 2010-09-23 ENCOUNTER — Ambulatory Visit: Payer: Self-pay | Admitting: Internal Medicine

## 2010-09-29 ENCOUNTER — Ambulatory Visit: Payer: Medicare Other | Admitting: Internal Medicine

## 2010-09-29 NOTE — Miscellaneous (Signed)
Summary: mammogram 2012  Clinical Lists Changes  Observations: Added new observation of MAMMOGRAM: normal (09/16/2010 9:52)      Preventive Care Screening  Mammogram:    Date:  09/16/2010    Results:  normal

## 2010-09-30 ENCOUNTER — Telehealth: Payer: Self-pay | Admitting: Internal Medicine

## 2010-10-06 NOTE — Progress Notes (Signed)
  Phone Note Refill Request Message from:  Fax from Pharmacy on September 30, 2010 4:23 PM  Refills Requested: Medication #1:  CARTIA XT 240 MG  CP24 once daily Initial call taken by: Ami Bullins CMA,  September 30, 2010 4:23 PM    Prescriptions: CARTIA XT 240 MG  CP24 (DILTIAZEM HCL COATED BEADS) once daily  #90 x 3   Entered by:   Ami Bullins CMA   Authorized by:   Tresa Garter MD   Signed by:   Bill Salinas CMA on 09/30/2010   Method used:   Faxed to ...       [facility]order)             , [place]         Ph:        Fax: [phone]   RxID:   [id number]

## 2010-10-09 ENCOUNTER — Telehealth: Payer: Self-pay | Admitting: *Deleted

## 2010-10-09 NOTE — Telephone Encounter (Signed)
Pt informed rx was faxed on 09-30-10 per [facility] EMR.

## 2010-10-09 NOTE — Telephone Encounter (Signed)
Pt left vm, wants to know if cartia was sent into pharm, per records rx was sent to [facility] on 09/30/10. Need to call pt to inform

## 2010-10-12 ENCOUNTER — Telehealth: Payer: Self-pay | Admitting: *Deleted

## 2010-10-12 NOTE — Telephone Encounter (Signed)
Patient requesting a call back regarding prescriptions she needs.

## 2010-10-13 MED ORDER — DILTIAZEM HCL ER COATED BEADS 240 MG PO CP24: 240.0000 mg | ORAL_CAPSULE | Freq: Every day | ORAL | Status: DC

## 2010-10-13 MED ORDER — CHOLECALCIFEROL 25 MCG (1000 UT) PO TABS: 1000.0000 [IU] | ORAL_TABLET | Freq: Every day | ORAL | Status: DC

## 2010-10-13 MED ORDER — LEVOTHYROXINE SODIUM 75 MCG PO TABS: 75.0000 ug | ORAL_TABLET | Freq: Every day | ORAL | Status: DC

## 2010-10-13 MED ORDER — OMEPRAZOLE 40 MG PO CPDR: 40.0000 mg | DELAYED_RELEASE_CAPSULE | Freq: Two times a day (BID) | ORAL | Status: DC

## 2010-10-13 MED ORDER — LOVASTATIN 40 MG PO TABS: 40.0000 mg | ORAL_TABLET | Freq: Every day | ORAL | Status: DC

## 2010-10-13 NOTE — Telephone Encounter (Signed)
Returned call to patient and [facility] scripts [facility] as requested

## 2010-10-16 ENCOUNTER — Encounter: Payer: Self-pay | Admitting: Internal Medicine

## 2010-10-16 ENCOUNTER — Other Ambulatory Visit (INDEPENDENT_AMBULATORY_CARE_PROVIDER_SITE_OTHER): Payer: Medicare Other

## 2010-10-16 ENCOUNTER — Ambulatory Visit (INDEPENDENT_AMBULATORY_CARE_PROVIDER_SITE_OTHER): Payer: Medicare Other | Admitting: Internal Medicine

## 2010-10-16 ENCOUNTER — Other Ambulatory Visit (INDEPENDENT_AMBULATORY_CARE_PROVIDER_SITE_OTHER): Payer: Medicare Other | Admitting: Internal Medicine

## 2010-10-16 DIAGNOSIS — Z0000 Encounter for general adult medical examination without abnormal findings: Secondary | ICD-10-CM

## 2010-10-16 DIAGNOSIS — E039 Hypothyroidism, unspecified: Secondary | ICD-10-CM

## 2010-10-16 DIAGNOSIS — M545 Low back pain, unspecified: Secondary | ICD-10-CM

## 2010-10-16 DIAGNOSIS — I10 Essential (primary) hypertension: Secondary | ICD-10-CM

## 2010-10-16 DIAGNOSIS — E785 Hyperlipidemia, unspecified: Secondary | ICD-10-CM

## 2010-10-16 DIAGNOSIS — J45909 Unspecified asthma, uncomplicated: Secondary | ICD-10-CM

## 2010-10-16 DIAGNOSIS — K219 Gastro-esophageal reflux disease without esophagitis: Secondary | ICD-10-CM

## 2010-10-16 DIAGNOSIS — G4700 Insomnia, unspecified: Secondary | ICD-10-CM

## 2010-10-16 LAB — URINALYSIS, ROUTINE W REFLEX MICROSCOPIC
Nitrite: NEGATIVE
Total Protein, Urine: NEGATIVE
pH: 5.5 (ref 5.0–8.0)

## 2010-10-16 LAB — BASIC METABOLIC PANEL
Chloride: 104 mEq/L (ref 96–112)
Potassium: 4.1 mEq/L (ref 3.5–5.1)

## 2010-10-16 MED ORDER — MELOXICAM 15 MG PO TABS: 15.0000 mg | ORAL_TABLET | Freq: Every day | ORAL | Status: DC | PRN

## 2010-10-16 NOTE — Assessment & Plan Note (Signed)
MSK strain after cutting grass. We will use NSAIDs. Stretching exerises given.

## 2010-10-16 NOTE — Progress Notes (Signed)
Subjective:    Patient ID: Cindy Simpson, female    DOB: [birth year], 65 y.o.   MRN: [id number]  HPI  The patient presents for a follow-up of  chronic hypertension, low thyroid, chronic dyslipidemia, GERD controlled with medicines C/o LBP after mowing her lawn x 1 wk    Review of Systems  Constitutional: Negative for appetite change.  HENT: Negative for mouth sores and neck stiffness.   Eyes: Negative for pain.  Respiratory: Negative for cough and wheezing.   Cardiovascular: Negative for chest pain.  Gastrointestinal: Negative for abdominal distention.  Genitourinary: Negative for dysuria and decreased urine volume.  Musculoskeletal: Positive for back pain. Negative for myalgias and arthralgias.  Skin: Negative for pallor.  Neurological: Negative for numbness.       Objective:   Physical Exam  Constitutional: She is oriented to person, place, and time. She appears well-developed. No distress.  HENT:  Head: Normocephalic.  Eyes: Pupils are equal, round, and reactive to light.  Neck: Normal range of motion.  Cardiovascular: Normal heart sounds.   Pulmonary/Chest: No respiratory distress. She exhibits no tenderness.  Abdominal: She exhibits no distension. There is no tenderness.  Musculoskeletal: She exhibits tenderness (LS is tender with ROM). She exhibits no edema.  Neurological: She is oriented to person, place, and time. She displays normal reflexes. She exhibits normal muscle tone.  Skin: No rash noted.  Psychiatric: Her behavior is normal. Judgment normal.          Assessment & Plan:  Low back pain MSK strain after cutting grass. We will use NSAIDs. Stretching exerises given.  ASTHMA Doing well  HYPOTHYROIDISM Cont Rx. Check TSH  HYPERLIPIDEMIA Cont Rx  Hypertension Cont Rx. Check labs

## 2010-10-16 NOTE — Assessment & Plan Note (Signed)
Doing well 

## 2010-10-16 NOTE — Assessment & Plan Note (Signed)
Cont Rx. Check TSH

## 2010-10-16 NOTE — Assessment & Plan Note (Signed)
Cont Rx 

## 2010-10-16 NOTE — Assessment & Plan Note (Signed)
Cont Rx Check labs 

## 2010-10-19 ENCOUNTER — Telehealth: Payer: Self-pay | Admitting: Internal Medicine

## 2010-10-19 LAB — TSH: TSH: 1.72 u[IU]/mL (ref 0.35–5.50)

## 2010-10-19 MED ORDER — CIPROFLOXACIN HCL 250 MG PO TABS: 250.0000 mg | ORAL_TABLET | Freq: Two times a day (BID) | ORAL | Status: DC

## 2010-10-19 NOTE — Telephone Encounter (Signed)
Cindy Simpson , please, inform the patient: UTI - take antibiotic  Please, keep  next office visit appointment.   Thank you !

## 2010-10-20 MED ORDER — CIPROFLOXACIN HCL 250 MG PO TABS: 250.0000 mg | ORAL_TABLET | Freq: Two times a day (BID) | ORAL | Status: DC

## 2010-10-20 NOTE — Telephone Encounter (Signed)
Left mess to call office back.  [facility]

## 2010-10-21 MED ORDER — CIPROFLOXACIN HCL 250 MG PO TABS: 250.0000 mg | ORAL_TABLET | Freq: Two times a day (BID) | ORAL | Status: DC

## 2010-10-21 NOTE — Telephone Encounter (Signed)
Patient informed. 

## 2010-10-22 LAB — POCT URINALYSIS DIP (DEVICE)
Protein, ur: NEGATIVE mg/dL
Specific Gravity, Urine: 1.015 (ref 1.005–1.030)
Urobilinogen, UA: 0.2 mg/dL (ref 0.0–1.0)

## 2010-10-26 ENCOUNTER — Telehealth: Payer: Self-pay | Admitting: *Deleted

## 2010-10-26 NOTE — Telephone Encounter (Signed)
Pharm called - there is an interaction between diltiazem and lovatatin. FDA recommends max dose of 10mg  of lovastatin. Please advise, pharm can not fill w/o Md response.

## 2010-10-27 MED ORDER — LOVASTATIN 10 MG PO TABS: 10.0000 mg | ORAL_TABLET | Freq: Every day | ORAL | Status: DC

## 2010-10-27 NOTE — Telephone Encounter (Signed)
Ok Lovastatin 10 mg - pls change Thx

## 2010-10-27 NOTE — Telephone Encounter (Signed)
Electronic rx was sent in and rejected. Called into pharm

## 2010-12-04 NOTE — Assessment & Plan Note (Signed)
[facility]                             PRIMARY CARE OFFICE NOTE   RETHEL, SEBEK                      MRN:          [id number]  DATE:05/25/2006                            DOB:          [birth year]    The patient is a 65 year old female who presents for wellness examination.   ALLERGIES:  None.   Past medical history, family history, social history as per April 23, 2004  note.   CURRENT MEDICATIONS:  Are reviewed.   REVIEW OF SYSTEMS:  She continues to work full-time. Enjoys playing golf.  The rest is negative.   PHYSICAL EXAMINATION:  Blood pressure: 114/69. Pulse: 73. Temperature: 98.1.  Weight: 258 pounds.  HEENT: With moist mucosa. Neck is supple. No thyromegaly or bruit.  LUNGS:  Clear. No wheezing or rales.  HEART: S1, S2. No murmur. No gallop.  ABDOMEN: Soft and nontender. No organomegaly or mass felt.  EXTREMITIES: Lower extremities without edema.  She is alert and cooperative. Denies being depressed.  SKIN: Clear.   LABORATORY DATA:  Labs on May 17, 2006: CBC normal; CMET normal;  cholesterol 180, LDL 115. TSH normal. Urinalysis normal. EKG today with  normal sinus rhythm.   ASSESSMENT/PLAN:  Normal wellness examination. Age/health related issues  discussed. Healthy lifestyle discussed. Regular gynecology care with her  gynecologist. Obtain chest x-ray today. She has had a flu shot. Colonoscopy  will do in 2007 or 2008, by Dr. Jarold Motto.  Mammogram yearly. I will see her  back in 12 months. Zostavax information provided.    ______________________________  [name], MD    AVP/MedQ  DD: 05/26/2006  DT: 05/26/2006  Job #: [id number]   cc:   Vania Rea. Jarold Motto, MD, Caleen Essex, FAGA  Ilda Mori, M.D.

## 2011-04-16 ENCOUNTER — Ambulatory Visit (INDEPENDENT_AMBULATORY_CARE_PROVIDER_SITE_OTHER): Payer: Medicare Other | Admitting: Internal Medicine

## 2011-04-16 ENCOUNTER — Encounter: Payer: Self-pay | Admitting: Internal Medicine

## 2011-04-16 ENCOUNTER — Other Ambulatory Visit (INDEPENDENT_AMBULATORY_CARE_PROVIDER_SITE_OTHER): Payer: Medicare Other

## 2011-04-16 VITALS — BP 120/80 | HR 76 | Temp 98.0°F | Resp 16 | Wt 163.0 lb

## 2011-04-16 DIAGNOSIS — M545 Low back pain, unspecified: Secondary | ICD-10-CM

## 2011-04-16 DIAGNOSIS — I10 Essential (primary) hypertension: Secondary | ICD-10-CM

## 2011-04-16 DIAGNOSIS — Z23 Encounter for immunization: Secondary | ICD-10-CM

## 2011-04-16 DIAGNOSIS — E785 Hyperlipidemia, unspecified: Secondary | ICD-10-CM

## 2011-04-16 DIAGNOSIS — E039 Hypothyroidism, unspecified: Secondary | ICD-10-CM

## 2011-04-16 LAB — COMPREHENSIVE METABOLIC PANEL
Albumin: 4.1 g/dL (ref 3.5–5.2)
Calcium: 9.4 mg/dL (ref 8.4–10.5)
Creatinine, Ser: 0.8 mg/dL (ref 0.4–1.2)
GFR: 75.24 mL/min (ref >60.00)
Glucose, Bld: 90 mg/dL (ref 70–99)
Sodium: 140 mEq/L (ref 135–145)

## 2011-04-16 LAB — TSH: TSH: 3.32 u[IU]/mL (ref 0.35–5.50)

## 2011-04-16 NOTE — Assessment & Plan Note (Signed)
Continue with current prescription therapy as reflected on the Med list.  

## 2011-04-16 NOTE — Progress Notes (Signed)
Subjective:    Patient ID: Cindy Simpson, female    DOB: 07/07/1946, 65 y.o.   MRN: [id number]  HPI  The patient presents for a follow-up of  chronic hypertension, chronic dyslipidemia, hypothyroidism controlled with medicines   Review of Systems  Constitutional: Negative for chills, activity change, appetite change, fatigue and unexpected weight change.  HENT: Negative for congestion, mouth sores and sinus pressure.   Eyes: Negative for visual disturbance.  Respiratory: Negative for cough and chest tightness.   Gastrointestinal: Negative for nausea and abdominal pain.  Genitourinary: Negative for frequency, difficulty urinating and vaginal pain.  Musculoskeletal: Negative for back pain and gait problem.  Skin: Negative for pallor and rash.  Neurological: Negative for dizziness, tremors, weakness, numbness and headaches.  Psychiatric/Behavioral: Negative for confusion and sleep disturbance. The patient is not nervous/anxious.        Objective:   Physical Exam  Constitutional: She appears well-developed. No distress.       Obese   HENT:  Head: Normocephalic.  Right Ear: External ear normal.  Left Ear: External ear normal.  Nose: Nose normal.  Mouth/Throat: Oropharynx is clear and moist.  Eyes: Conjunctivae are normal. Pupils are equal, round, and reactive to light. Right eye exhibits no discharge. Left eye exhibits no discharge.  Neck: Normal range of motion. Neck supple. No JVD present. No tracheal deviation present. No thyromegaly present.  Cardiovascular: Normal rate, regular rhythm and normal heart sounds.   Pulmonary/Chest: No stridor. No respiratory distress. She has no wheezes.  Abdominal: Soft. Bowel sounds are normal. She exhibits no distension and no mass. There is no tenderness. There is no rebound and no guarding.  Musculoskeletal: She exhibits no edema and no tenderness.  Lymphadenopathy:    She has no cervical adenopathy.  Neurological: She displays normal  reflexes. No cranial nerve deficit. She exhibits normal muscle tone. Coordination normal.  Skin: No rash noted. No erythema.  Psychiatric: She has a normal mood and affect. Her behavior is normal. Judgment and thought content normal.          Assessment & Plan:

## 2011-04-16 NOTE — Assessment & Plan Note (Signed)
resolved 

## 2011-04-20 LAB — POCT URINALYSIS DIP (DEVICE)
Glucose, UA: NEGATIVE mg/dL
Nitrite: NEGATIVE
Urobilinogen, UA: 1 mg/dL (ref 0.0–1.0)
pH: 7.5 (ref 5.0–8.0)

## 2011-10-13 ENCOUNTER — Ambulatory Visit (INDEPENDENT_AMBULATORY_CARE_PROVIDER_SITE_OTHER): Payer: Medicare Other | Admitting: Internal Medicine

## 2011-10-13 ENCOUNTER — Encounter: Payer: Self-pay | Admitting: Internal Medicine

## 2011-10-13 ENCOUNTER — Other Ambulatory Visit (INDEPENDENT_AMBULATORY_CARE_PROVIDER_SITE_OTHER): Payer: Medicare Other

## 2011-10-13 VITALS — BP 130/90 | HR 80 | Temp 97.7°F | Resp 16 | Wt 165.0 lb

## 2011-10-13 DIAGNOSIS — K219 Gastro-esophageal reflux disease without esophagitis: Secondary | ICD-10-CM

## 2011-10-13 DIAGNOSIS — I10 Essential (primary) hypertension: Secondary | ICD-10-CM

## 2011-10-13 DIAGNOSIS — G4700 Insomnia, unspecified: Secondary | ICD-10-CM

## 2011-10-13 DIAGNOSIS — E785 Hyperlipidemia, unspecified: Secondary | ICD-10-CM

## 2011-10-13 DIAGNOSIS — J45909 Unspecified asthma, uncomplicated: Secondary | ICD-10-CM

## 2011-10-13 DIAGNOSIS — E039 Hypothyroidism, unspecified: Secondary | ICD-10-CM

## 2011-10-13 LAB — BASIC METABOLIC PANEL
GFR: 87.47 mL/min (ref >60.00)
Potassium: 4.4 mEq/L (ref 3.5–5.1)
Sodium: 136 mEq/L (ref 135–145)

## 2011-10-13 LAB — LIPID PANEL
Cholesterol: 199 mg/dL (ref 0–200)
HDL: 59.7 mg/dL (ref >39.00)
VLDL: 14.8 mg/dL (ref 0.0–40.0)

## 2011-10-13 LAB — HEPATIC FUNCTION PANEL
ALT: 19 U/L (ref 0–35)
Total Bilirubin: 0.3 mg/dL (ref 0.3–1.2)
Total Protein: 7.7 g/dL (ref 6.0–8.3)

## 2011-10-13 MED ORDER — DILTIAZEM HCL ER COATED BEADS 240 MG PO CP24: 240.0000 mg | ORAL_CAPSULE | Freq: Every day | ORAL | Status: DC

## 2011-10-13 MED ORDER — LEVOTHYROXINE SODIUM 75 MCG PO TABS: 75.0000 ug | ORAL_TABLET | Freq: Every day | ORAL | Status: DC

## 2011-10-13 MED ORDER — LOVASTATIN 10 MG PO TABS: 10.0000 mg | ORAL_TABLET | Freq: Every day | ORAL | Status: DC

## 2011-10-13 MED ORDER — OMEPRAZOLE 40 MG PO CPDR: 40.0000 mg | DELAYED_RELEASE_CAPSULE | Freq: Two times a day (BID) | ORAL | Status: DC

## 2011-10-13 NOTE — Assessment & Plan Note (Signed)
Continue with current prescription therapy as reflected on the Med list.  

## 2011-10-13 NOTE — Assessment & Plan Note (Signed)
Chronic  Using OTC meds prn 

## 2011-10-13 NOTE — Progress Notes (Signed)
Patient ID: Cindy Simpson, female   DOB: [birth year], 66 y.o.   MRN: [id number]  Subjective:    Patient ID: Cindy Simpson, female    DOB: 1946-05-16, 66 y.o.   MRN: [id number]  HPI  The patient presents for a follow-up of  chronic hypertension, chronic dyslipidemia, hypothyroidism controlled with medicines  Wt Readings from Last 3 Encounters:  10/13/11 165 lb (74.844 kg)  04/16/11 163 lb (73.936 kg)  10/16/10 160 lb (72.576 kg)   BP Readings from Last 3 Encounters:  10/13/11 130/90  04/16/11 120/80  10/16/10 128/90      Review of Systems  Constitutional: Negative for chills, activity change, appetite change, fatigue and unexpected weight change.  HENT: Negative for congestion, mouth sores and sinus pressure.   Eyes: Negative for visual disturbance.  Respiratory: Negative for cough and chest tightness.   Gastrointestinal: Negative for nausea and abdominal pain.  Genitourinary: Negative for frequency, difficulty urinating and vaginal pain.  Musculoskeletal: Negative for back pain and gait problem.  Skin: Negative for pallor and rash.  Neurological: Negative for dizziness, tremors, weakness, numbness and headaches.  Psychiatric/Behavioral: Negative for confusion and sleep disturbance. The patient is not nervous/anxious.        Objective:   Physical Exam  Constitutional: She appears well-developed. No distress.       Obese   HENT:  Head: Normocephalic.  Right Ear: External ear normal.  Left Ear: External ear normal.  Nose: Nose normal.  Mouth/Throat: Oropharynx is clear and moist.  Eyes: Conjunctivae are normal. Pupils are equal, round, and reactive to light. Right eye exhibits no discharge. Left eye exhibits no discharge.  Neck: Normal range of motion. Neck supple. No JVD present. No tracheal deviation present. No thyromegaly present.  Cardiovascular: Normal rate, regular rhythm and normal heart sounds.   Pulmonary/Chest: No stridor. No respiratory distress. She has  no wheezes.  Abdominal: Soft. Bowel sounds are normal. She exhibits no distension and no mass. There is no tenderness. There is no rebound and no guarding.  Musculoskeletal: She exhibits no edema and no tenderness.  Lymphadenopathy:    She has no cervical adenopathy.  Neurological: She displays normal reflexes. No cranial nerve deficit. She exhibits normal muscle tone. Coordination normal.  Skin: No rash noted. No erythema.  Psychiatric: She has a normal mood and affect. Her behavior is normal. Judgment and thought content normal.   Lab Results  Component Value Date   WBC 7.9 03/17/2010   HGB 12.4 03/17/2010   HCT 36.1 03/17/2010   PLT 293.0 03/17/2010   GLUCOSE 90 04/16/2011   CHOL 193 03/17/2010   TRIG 80.0 03/17/2010   HDL 50.10 03/17/2010   LDLDIRECT 137.5 03/17/2009   LDLCALC 127* 03/17/2010   ALT 17 04/16/2011   AST 22 04/16/2011   NA 140 04/16/2011   K 4.2 04/16/2011   CL 104 04/16/2011   CREATININE 0.8 04/16/2011   BUN 16 04/16/2011   CO2 28 04/16/2011   TSH 3.32 04/16/2011          Assessment & Plan:

## 2011-10-13 NOTE — Assessment & Plan Note (Signed)
Symbicort sample given

## 2012-04-17 ENCOUNTER — Ambulatory Visit (INDEPENDENT_AMBULATORY_CARE_PROVIDER_SITE_OTHER): Payer: Medicare Other | Admitting: Internal Medicine

## 2012-04-17 ENCOUNTER — Encounter: Payer: Self-pay | Admitting: Internal Medicine

## 2012-04-17 VITALS — BP 120/80 | HR 80 | Temp 97.0°F | Resp 16 | Wt 158.8 lb

## 2012-04-17 DIAGNOSIS — M25511 Pain in right shoulder: Secondary | ICD-10-CM | POA: Insufficient documentation

## 2012-04-17 DIAGNOSIS — I10 Essential (primary) hypertension: Secondary | ICD-10-CM

## 2012-04-17 DIAGNOSIS — E039 Hypothyroidism, unspecified: Secondary | ICD-10-CM

## 2012-04-17 DIAGNOSIS — Z0000 Encounter for general adult medical examination without abnormal findings: Secondary | ICD-10-CM | POA: Insufficient documentation

## 2012-04-17 DIAGNOSIS — E785 Hyperlipidemia, unspecified: Secondary | ICD-10-CM

## 2012-04-17 DIAGNOSIS — Z136 Encounter for screening for cardiovascular disorders: Secondary | ICD-10-CM

## 2012-04-17 DIAGNOSIS — J45909 Unspecified asthma, uncomplicated: Secondary | ICD-10-CM

## 2012-04-17 DIAGNOSIS — G4700 Insomnia, unspecified: Secondary | ICD-10-CM

## 2012-04-17 DIAGNOSIS — R5381 Other malaise: Secondary | ICD-10-CM

## 2012-04-17 DIAGNOSIS — K219 Gastro-esophageal reflux disease without esophagitis: Secondary | ICD-10-CM

## 2012-04-17 DIAGNOSIS — M25519 Pain in unspecified shoulder: Secondary | ICD-10-CM

## 2012-04-17 MED ORDER — IBUPROFEN 600 MG PO TABS: ORAL_TABLET | ORAL | Status: DC

## 2012-04-17 NOTE — Assessment & Plan Note (Signed)
Continue with current prescription therapy as reflected on the Med list.  

## 2012-04-17 NOTE — Assessment & Plan Note (Signed)
Chronic She is using cinnamon caps 

## 2012-04-17 NOTE — Assessment & Plan Note (Signed)
Declined sleep test    Declined sleep test

## 2012-04-17 NOTE — Progress Notes (Signed)
Subjective:    Patient ID: Cindy Simpson, female    DOB: 10-Dec-1945, 66 y.o.   MRN: [id number]  HPI The patient is here for a wellness exam. The patient has been doing well overall without major physical or psychological issues going on lately, except for R shoulder pain - months... The patient presents for a follow-up of  chronic hypertension, chronic dyslipidemia, hypothyroidism controlled with medicines  Wt Readings from Last 3 Encounters:  04/17/12 158 lb 12 oz (72.009 kg)  10/13/11 165 lb (74.844 kg)  04/16/11 163 lb (73.936 kg)   BP Readings from Last 3 Encounters:  04/17/12 120/80  10/13/11 130/90  04/16/11 120/80      Review of Systems  Constitutional: Negative for chills, activity change, appetite change, fatigue and unexpected weight change.  HENT: Negative for congestion, mouth sores and sinus pressure.   Eyes: Negative for visual disturbance.  Respiratory: Negative for cough and chest tightness.   Gastrointestinal: Negative for nausea and abdominal pain.  Genitourinary: Negative for frequency, difficulty urinating and vaginal pain.  Musculoskeletal: Negative for back pain and gait problem.  Skin: Negative for pallor and rash.  Neurological: Negative for dizziness, tremors, weakness, numbness and headaches.  Psychiatric/Behavioral: Negative for confusion and disturbed wake/sleep cycle. The patient is not nervous/anxious.        Objective:   Physical Exam  Constitutional: She appears well-developed. No distress.       Obese   HENT:  Head: Normocephalic.  Right Ear: External ear normal.  Left Ear: External ear normal.  Nose: Nose normal.  Mouth/Throat: Oropharynx is clear and moist.  Eyes: Conjunctivae normal are normal. Pupils are equal, round, and reactive to light. Right eye exhibits no discharge. Left eye exhibits no discharge.  Neck: Normal range of motion. Neck supple. No JVD present. No tracheal deviation present. No thyromegaly present.    Cardiovascular: Normal rate, regular rhythm and normal heart sounds.   Pulmonary/Chest: No stridor. No respiratory distress. She has no wheezes.  Abdominal: Soft. Bowel sounds are normal. She exhibits no distension and no mass. There is no tenderness. There is no rebound and no guarding.  Musculoskeletal: She exhibits no edema. Tenderness: R subacr space is very tender.  Lymphadenopathy:    She has no cervical adenopathy.  Neurological: She displays normal reflexes. No cranial nerve deficit. She exhibits normal muscle tone. Coordination normal.  Skin: No rash noted. No erythema.  Psychiatric: She has a normal mood and affect. Her behavior is normal. Judgment and thought content normal.   Lab Results  Component Value Date   WBC 7.9 03/17/2010   HGB 12.4 03/17/2010   HCT 36.1 03/17/2010   PLT 293.0 03/17/2010   GLUCOSE 94 10/13/2011   CHOL 199 10/13/2011   TRIG 74.0 10/13/2011   HDL 59.70 10/13/2011   LDLDIRECT 137.5 03/17/2009   LDLCALC 125* 10/13/2011   ALT 19 10/13/2011   AST 20 10/13/2011   NA 136 10/13/2011   K 4.4 10/13/2011   CL 101 10/13/2011   CREATININE 0.7 10/13/2011   BUN 17 10/13/2011   CO2 27 10/13/2011   TSH 3.32 04/16/2011      Procedure :Joint Injection, R  shoulder   Indication:  Subacromial bursitis with refractory  chronic pain.   Risks including unsuccessful procedure , bleeding, infection, bruising, skin atrophy and others were explained to the patient in detail as well as the benefits. Informed consent was obtained and signed.   Tthe patient was placed in a comfortable position. Lateral  approach was used. Skin was prepped with Betadine and alcohol  and anesthetized with 2 cc of 2% lidocaine and epinephrine, using a 25-gauge 1-1/2 inch needle. Then, a 5 cc syringe with a 2 inch long 24-gauge needle was used for a joint injection.. The needle was advanced  Into the subacromial space.The bursa was injected with 3 mL of 2% lidocaine and 40 mg of Depo-Medrol .  Band-Aid was  applied.   Tolerated well. Complications: None. Good pain relief following the procedure.   Postprocedure instructions :    A Band-Aid should be left on for 12 hours. Injection therapy is not a cure itself. It is used in conjunction with other modalities. You can use nonsteroidal anti-inflammatories like ibuprofen , hot and cold compresses. Rest is recommended in the next 24 hours. You need to report immediately  if fever, chills or any signs of infection develop.       Assessment & Plan:

## 2012-04-17 NOTE — Patient Instructions (Signed)
Postprocedure instructions :    A Band-Aid should be left on for 12 hours. Injection therapy is not a cure itself. It is used in conjunction with other modalities. You can use nonsteroidal anti-inflammatories like ibuprofen , hot and cold compresses. Rest is recommended in the next 24 hours. You need to report immediately  if fever, chills or any signs of infection develop. 

## 2012-04-17 NOTE — Assessment & Plan Note (Signed)
Chronic  Using OTC meds prn

## 2012-04-17 NOTE — Assessment & Plan Note (Signed)

## 2012-04-17 NOTE — Assessment & Plan Note (Signed)
9/13 MSK (she is L handed) - rot cuff tendonitis and subacromial bursitis Option to treat discussed She asked me to inject PT offered

## 2012-06-21 ENCOUNTER — Emergency Department (HOSPITAL_COMMUNITY): Payer: Medicare Other

## 2012-06-21 ENCOUNTER — Encounter (HOSPITAL_COMMUNITY): Payer: Self-pay | Admitting: Emergency Medicine

## 2012-06-21 ENCOUNTER — Observation Stay (HOSPITAL_COMMUNITY)
Admission: EM | Admit: 2012-06-21 | Discharge: 2012-06-22 | Disposition: A | Discharge status: Home / Self Care | Payer: Medicare Other | Attending: Internal Medicine | Admitting: Internal Medicine

## 2012-06-21 DIAGNOSIS — R059 Cough, unspecified: Secondary | ICD-10-CM | POA: Insufficient documentation

## 2012-06-21 DIAGNOSIS — E876 Hypokalemia: Secondary | ICD-10-CM | POA: Insufficient documentation

## 2012-06-21 DIAGNOSIS — A088 Other specified intestinal infections: Principal | ICD-10-CM | POA: Insufficient documentation

## 2012-06-21 DIAGNOSIS — R112 Nausea with vomiting, unspecified: Secondary | ICD-10-CM | POA: Insufficient documentation

## 2012-06-21 DIAGNOSIS — K449 Diaphragmatic hernia without obstruction or gangrene: Secondary | ICD-10-CM | POA: Insufficient documentation

## 2012-06-21 DIAGNOSIS — D72829 Elevated white blood cell count, unspecified: Secondary | ICD-10-CM | POA: Insufficient documentation

## 2012-06-21 DIAGNOSIS — R1115 Cyclical vomiting syndrome unrelated to migraine: Secondary | ICD-10-CM

## 2012-06-21 DIAGNOSIS — E039 Hypothyroidism, unspecified: Secondary | ICD-10-CM | POA: Diagnosis present

## 2012-06-21 DIAGNOSIS — E785 Hyperlipidemia, unspecified: Secondary | ICD-10-CM | POA: Diagnosis present

## 2012-06-21 DIAGNOSIS — I10 Essential (primary) hypertension: Secondary | ICD-10-CM

## 2012-06-21 DIAGNOSIS — Z79899 Other long term (current) drug therapy: Secondary | ICD-10-CM | POA: Insufficient documentation

## 2012-06-21 DIAGNOSIS — K219 Gastro-esophageal reflux disease without esophagitis: Secondary | ICD-10-CM

## 2012-06-21 DIAGNOSIS — R05 Cough: Secondary | ICD-10-CM | POA: Insufficient documentation

## 2012-06-21 DIAGNOSIS — J069 Acute upper respiratory infection, unspecified: Secondary | ICD-10-CM

## 2012-06-21 DIAGNOSIS — A084 Viral intestinal infection, unspecified: Secondary | ICD-10-CM | POA: Diagnosis present

## 2012-06-21 LAB — CBC WITH DIFFERENTIAL/PLATELET
Basophils Absolute: 0 10*3/uL (ref 0.0–0.1)
Basophils Relative: 0 % (ref 0–1)
Eosinophils Relative: 0 % (ref 0–5)
Lymphocytes Relative: 3 % — ABNORMAL LOW (ref 12–46)
MCV: 91.2 fL (ref 78.0–100.0)
Neutro Abs: 14 10*3/uL — ABNORMAL HIGH (ref 1.7–7.7)
Platelets: 310 10*3/uL (ref 150–400)
RDW: 13.7 % (ref 11.5–15.5)
WBC: 15.1 10*3/uL — ABNORMAL HIGH (ref 4.0–10.5)

## 2012-06-21 LAB — INFLUENZA PANEL BY PCR (TYPE A & B)
H1N1 flu by pcr: NOT DETECTED
Influenza A By PCR: NEGATIVE
Influenza B By PCR: NEGATIVE

## 2012-06-21 LAB — URINALYSIS, ROUTINE W REFLEX MICROSCOPIC
Glucose, UA: 100 mg/dL — AB
Specific Gravity, Urine: 1.017 (ref 1.005–1.030)
pH: 6.5 (ref 5.0–8.0)

## 2012-06-21 LAB — URINE MICROSCOPIC-ADD ON

## 2012-06-21 LAB — COMPREHENSIVE METABOLIC PANEL
ALT: 18 U/L (ref 0–35)
AST: 22 U/L (ref 0–37)
Albumin: 3.7 g/dL (ref 3.5–5.2)
CO2: 23 mEq/L (ref 19–32)
Calcium: 9 mg/dL (ref 8.4–10.5)
GFR calc non Af Amer: >90 mL/min (ref >90)
Sodium: 139 mEq/L (ref 135–145)

## 2012-06-21 LAB — TROPONIN I
Troponin I: <0.3 ng/mL (ref <0.30)
Troponin I: <0.3 ng/mL (ref <0.30)

## 2012-06-21 MED ORDER — POTASSIUM CHLORIDE 10 MEQ/100ML IV SOLN
10.0000 meq | Freq: Once | INTRAVENOUS | Status: AC
  Administered 2012-06-21: 10 meq via INTRAVENOUS
  Filled 2012-06-21: qty 100

## 2012-06-21 MED ORDER — ACETAMINOPHEN 650 MG RE SUPP: 650.0000 mg | Freq: Four times a day (QID) | RECTAL | Status: DC | PRN

## 2012-06-21 MED ORDER — FENTANYL CITRATE 0.05 MG/ML IJ SOLN
50.0000 ug | Freq: Once | INTRAMUSCULAR | Status: AC
  Administered 2012-06-21: 50 ug via INTRAVENOUS
  Filled 2012-06-21: qty 2

## 2012-06-21 MED ORDER — ONDANSETRON HCL 4 MG/2ML IJ SOLN
4.0000 mg | Freq: Once | INTRAMUSCULAR | Status: AC
  Administered 2012-06-21: 4 mg via INTRAVENOUS
  Filled 2012-06-21: qty 2

## 2012-06-21 MED ORDER — ONDANSETRON HCL 4 MG/2ML IJ SOLN: 4.0000 mg | Freq: Four times a day (QID) | INTRAMUSCULAR | Status: DC | PRN

## 2012-06-21 MED ORDER — PANTOPRAZOLE SODIUM 40 MG PO TBEC
80.0000 mg | DELAYED_RELEASE_TABLET | Freq: Every day | ORAL | Status: DC
  Administered 2012-06-21 – 2012-06-22 (×2): 80 mg via ORAL
  Filled 2012-06-21 (×2): qty 2

## 2012-06-21 MED ORDER — SODIUM CHLORIDE 0.9 % IV SOLN
Freq: Once | INTRAVENOUS | Status: AC
  Administered 2012-06-21: 04:00:00 via INTRAVENOUS

## 2012-06-21 MED ORDER — ONDANSETRON HCL 4 MG PO TABS: 4.0000 mg | ORAL_TABLET | Freq: Four times a day (QID) | ORAL | Status: DC | PRN

## 2012-06-21 MED ORDER — ENOXAPARIN SODIUM 40 MG/0.4ML ~~LOC~~ SOLN
40.0000 mg | SUBCUTANEOUS | Status: DC
  Administered 2012-06-21 – 2012-06-22 (×2): 40 mg via SUBCUTANEOUS
  Filled 2012-06-21 (×2): qty 0.4

## 2012-06-21 MED ORDER — LEVOTHYROXINE SODIUM 75 MCG PO TABS
75.0000 ug | ORAL_TABLET | Freq: Every day | ORAL | Status: DC
  Administered 2012-06-22: 75 ug via ORAL
  Filled 2012-06-21 (×2): qty 1

## 2012-06-21 MED ORDER — HYDROCODONE-ACETAMINOPHEN 5-325 MG PO TABS: 1.0000 | ORAL_TABLET | ORAL | Status: DC | PRN

## 2012-06-21 MED ORDER — ACETAMINOPHEN 325 MG PO TABS
650.0000 mg | ORAL_TABLET | Freq: Four times a day (QID) | ORAL | Status: DC | PRN
  Administered 2012-06-21 – 2012-06-22 (×2): 650 mg via ORAL
  Filled 2012-06-21 (×2): qty 2

## 2012-06-21 MED ORDER — POTASSIUM CHLORIDE IN NACL 20-0.9 MEQ/L-% IV SOLN
INTRAVENOUS | Status: DC
  Administered 2012-06-21 – 2012-06-22 (×3): via INTRAVENOUS
  Filled 2012-06-21 (×4): qty 1000

## 2012-06-21 MED ORDER — ASPIRIN 325 MG PO TABS
325.0000 mg | ORAL_TABLET | Freq: Every day | ORAL | Status: DC
  Administered 2012-06-21 – 2012-06-22 (×2): 325 mg via ORAL
  Filled 2012-06-21 (×2): qty 1

## 2012-06-21 MED ORDER — SODIUM CHLORIDE 0.9 % IV SOLN: INTRAVENOUS | Status: DC

## 2012-06-21 NOTE — ED Notes (Signed)
Pt given ginger ale--- pt taking sips and tolerating well; however, she states she still feels a little nauseous.

## 2012-06-21 NOTE — H&P (Addendum)
[facility] History and Physical  Cindy Simpson WJX:[id number] DOB: 07-03-1946 DOA: 06/21/2012   PCP: [name], MD   Chief Complaint: intractible vomiting  HPI:  66 year old female history of hypertension, hypothyroidism, and gastroesophageal reflux disease, presents with intractable vomiting that started around 8 PM last night. The patient states that she had about 10-12 episodes of vomiting. She denies any fevers, chills, chest pain, shortness of breath. There is no hematemesis, diarrhea, hematochezia, or melena. She does have some mild abdominal pain, but it has improved since her stay here in the emergency department. She states that her daughter-in-law has developed a similar illness within the last 24 hours. In addition, the husband who is also present in the room feels nauseous and states that he  may be developing a similar illness. The patient spent time with her daughter-in-law on Monday and ate at a [employer]. In addition, the patient and her husband ate at [facility] yesterday. There is no other exotic foods or undercooked foods. There's been no travels. The patient has not had any fevers. There are no rashes or synovitis. The patient denies any dysuria, hematuria, or new medications. However, the patient states that she takes ibuprofen 600 mg twice a day for the past month. She uses this for pain in her right rotator cuff. She has had a hysterectomy. In emergency department, the patient is starting to feel better, but she still feels nauseous. Acute abdominal series was nonacute. Patient did have some dizziness but denies any presyncope or syncope. No visual loss, dysarthria, or focal extremity weakness. Assessment/Plan: Intractable vomiting -Likely due to viral illness/gastroenteritis -Patient has no fevers, but family has had similar illness whom she has had contact with -Gradually improving with conservative care -Continue IV fluids, antiemetics -Conservative care  for now. If no improvement, will obtain CT abdomen pelvis -Avoid NSAIDs -Hepatic panel and lipase was negative. -Check orthostatic vitals -Clear liquid diet for now, advance as tolerated -low suspicion, but will check influenza Leukocytosis -Likely reactive due to the patient's vomiting -Patient has no fever or systemic toxicity at this time -Recheck in the morning Hypokalemia -Likely due to the patient's vomiting -Will add potassium to the patient's maintenance fluids -Check EKG Hypertension -I will hold the patient's Cardizem for now given her vomiting and dehydration -She has remained normotensive in emergency department -Certainly, this can be restarted if the patient is able to tolerate by mouth medicines and becomes hypertensive Hypothyroidism -Continue oral Synthroid if patient is able to tolerate        Past Medical History  Diagnosis Date  . GERD (gastroesophageal reflux disease)   . Hyperlipidemia   . Hypothyroidism   . Allergic rhinitis   . IBS (irritable bowel syndrome)   . Asthmatic bronchitis 2009  . Insomnia    Past Surgical History  Procedure Date  . Abdominal hysterectomy    Social History:  reports that she has never smoked. She does not have any smokeless tobacco history on file. She reports that she does not drink alcohol or use illicit drugs.   Family History  Problem Relation Age of Onset  . Hypertension Other   . Hypertension Mother      Allergies  Allergen Reactions  . Metoclopramide Hcl       Prior to Admission medications   Medication Sig Start Date End Date Taking? Authorizing Provider  aspirin 325 MG tablet Take 325 mg by mouth daily.     Yes Historical Provider, MD  Cholecalciferol 1000 UNITS tablet  Take 2,000 Units by mouth daily. 10/13/10  Yes [name], MD  CINNAMON PO Take by mouth daily.   Yes Historical Provider, MD  diltiazem (CARDIZEM CD) 240 MG 24 hr capsule Take 1 capsule (240 mg total) by mouth daily.  10/13/11  Yes [name], MD  fish oil-omega-3 fatty acids 1000 MG capsule Take 1 g by mouth daily.   Yes Historical Provider, MD  ibuprofen (ADVIL,MOTRIN) 600 MG tablet Bid pc prn pain 04/17/12  Yes [name], MD  levothyroxine (SYNTHROID, LEVOTHROID) 75 MCG tablet Take 1 tablet (75 mcg total) by mouth daily. 10/13/11  Yes [name], MD  lovastatin (MEVACOR) 10 MG tablet Take 1 tablet (10 mg total) by mouth daily. REF # [id number] 10/13/11 10/12/12 Yes [name], MD  Multiple Vitamin (MULTI-VITAMIN PO) Take 1 tablet by mouth daily.   Yes Historical Provider, MD  omeprazole (PRILOSEC) 40 MG capsule Take 1 capsule (40 mg total) by mouth 2 (two) times daily. 10/13/11  Yes Tresa Garter, MD    Review of Systems:  Constitutional:  No weight loss, night sweats, Fevers, chills, fatigue.  Head&Eyes: No headache.  No vision loss.  No eye pain or scotoma ENT:  No Difficulty swallowing,Tooth/dental problems,Sore throat,   Cardio-vascular:  No chest pain,swelling in lower extremities,  dizziness, palpitations  GI:  No  abdominal pain, nausea, vomiting, diarrhea, loss of appetite, hematochezia, melena, heartburn, indigestion, Resp:  No shortness of breath with exertion or at rest. No cough. No coughing up of blood  Skin:  no rash or lesions.  GU:  no dysuria, change in color of urine, no urgency or frequency. No flank pain.  Musculoskeletal:  No joint pain or swelling. No decreased range of motion. No back pain.  Psych:  No change in mood or affect. No depression or anxiety. Neurologic: No headache, no dysesthesia, no focal weakness, no vision loss. No syncope  Physical Exam: Filed Vitals:   06/21/12 0322  BP: 126/70  Pulse: 97  Temp: 98.3 F (36.8 C)  TempSrc: Oral  Resp: 20  SpO2: 98%   General:  A&O x 3, NAD, nontoxic, pleasant/cooperative Head/Eye: No conjunctival hemorrhage, no icterus, [place]/AT, No nystagmus ENT:  No icterus,  No thrush, no  pharyngeal exudate Neck:  No masses, no lymphadenpathy, no bruits CV:  RRR, no rub, no gallop, no S3 Lung:  CTAB, good air movement, no wheeze, no rhonchi Abdomen: soft/NT, +BS, nondistended, no peritoneal signs Ext: No cyanosis, No rashes, No petechiae, No lymphangitis, No edema   Labs on Admission:  Basic Metabolic Panel:  Lab 06/21/12 0102  NA 139  K 3.4*  CL 102  CO2 23  GLUCOSE 143*  BUN 21  CREATININE 0.63  CALCIUM 9.0  MG --  PHOS --   Liver Function Tests:  Lab 06/21/12 0355  AST 22  ALT 18  ALKPHOS 110  BILITOT 0.3  PROT 7.0  ALBUMIN 3.7    Lab 06/21/12 0355  LIPASE 32  AMYLASE --   No results found for this basename: AMMONIA:5 in the last 168 hours CBC:  Lab 06/21/12 0355  WBC 15.1*  NEUTROABS 14.0*  HGB 12.3  HCT 36.2  MCV 91.2  PLT 310   Cardiac Enzymes:  Lab 06/21/12 0355  CKTOTAL --  CKMB --  CKMBINDEX --  TROPONINI <0.30   BNP: No components found with this basename: POCBNP:5 CBG: No results found for this basename: GLUCAP:5 in the last 168 hours  Radiological Exams on Admission: Dg Abd Acute W/chest  06/21/2012  *RADIOLOGY REPORT*  Clinical Data: Cough, nausea and vomiting.  ACUTE ABDOMEN SERIES (ABDOMEN 2 VIEW & CHEST 1 VIEW)  Comparison: None.  Findings: The lungs are well-aerated and clear.  There is no evidence of focal opacification, pleural effusion or pneumothorax. The cardiomediastinal silhouette is borderline normal in size.  An apparent large hiatal hernia is seen.  The visualized bowel gas pattern is unremarkable.  Scattered stool and air are seen within the colon; there is no evidence of small bowel dilatation to suggest obstruction.  No free intra-abdominal air is identified on the provided upright view.  No acute osseous abnormalities are seen; the sacroiliac joints are unremarkable in appearance.  IMPRESSION:  1.  Unremarkable bowel gas pattern; no free intra-abdominal air seen. 2.  No acute cardiopulmonary process  identified. 3.  Large hiatal hernia noted.   Original Report Authenticated By: Tonia Ghent, M.D.     EKG: Independently reviewed. Not performed in ED    Time spent:60 minutes Code Status:   Full Family Communication:   Husband at bedside   Kassadee Carawan, DO  [facility] Pager [phone]  If 7PM-7AM, please contact night-coverage [email or web address] Password [email or web address] 06/21/2012, 7:55 AM

## 2012-06-21 NOTE — ED Notes (Signed)
Pt unable to catch urine sample , will try again

## 2012-06-21 NOTE — ED Notes (Signed)
Pt presents with c/o vomiting  Pt states it started about 8pm  Pt states she has mild abd pain but has a lot of gas and is belching a lot

## 2012-06-21 NOTE — ED Notes (Signed)
Report to Selena Batten, RN-transfer to 3rd floor

## 2012-06-21 NOTE — ED Provider Notes (Signed)
History     CSN: [id number]  Arrival date & time 06/21/12  0308   First MD Initiated Contact with Patient 06/21/12 0405      Chief Complaint  Patient presents with  . Emesis    (Consider location/radiation/quality/duration/timing/severity/associated sxs/prior treatment) HPI History provided by patient and her husband bedside. Since 8 PM last night having persistent nausea and vomiting. No bloody or bilious emesis. Has had some mild intermittent abdominal cramping but denies any significant pain. Normal bowel movement today. No diarrhea. No blood in stools. No fevers or chills. No back pain. No known sick contacts. No known bad food exposures. Has history of IBS but no history of similar symptoms. No new medications. Past Medical History  Diagnosis Date  . GERD (gastroesophageal reflux disease)   . Hyperlipidemia   . Hypothyroidism   . Allergic rhinitis   . IBS (irritable bowel syndrome)   . Asthmatic bronchitis 2009  . Insomnia     Past Surgical History  Procedure Date  . Abdominal hysterectomy     Family History  Problem Relation Age of Onset  . Hypertension Other   . Hypertension Mother     History  Substance Use Topics  . Smoking status: Never Smoker   . Smokeless tobacco: Not on file  . Alcohol Use: No    OB History    Grav Para Term Preterm Abortions TAB SAB Ect Mult Living                  Review of Systems  Constitutional: Negative for fever and chills.  HENT: Negative for neck pain and neck stiffness.   Eyes: Negative for pain.  Respiratory: Negative for shortness of breath.   Cardiovascular: Negative for chest pain.  Gastrointestinal: Positive for nausea and vomiting. Negative for abdominal distention.  Genitourinary: Negative for dysuria.  Musculoskeletal: Negative for back pain.  Skin: Negative for rash.  Neurological: Negative for headaches.  All other systems reviewed and are negative.    Allergies  Metoclopramide hcl  Home  Medications   Current Outpatient Rx  Name  Route  Sig  Dispense  Refill  . ASPIRIN 325 MG PO TABS   Oral   Take 325 mg by mouth daily.           . CHOLECALCIFEROL 1000 UNITS PO TABS   Oral   Take 2,000 Units by mouth daily.         Marland Kitchen CINNAMON PO   Oral   Take by mouth daily.         Marland Kitchen DILTIAZEM HCL ER COATED BEADS 240 MG PO CP24   Oral   Take 1 capsule (240 mg total) by mouth daily.   90 capsule   3   . OMEGA-3 FATTY ACIDS 1000 MG PO CAPS   Oral   Take 1 g by mouth daily.         . IBUPROFEN 600 MG PO TABS      Bid pc prn pain   60 tablet   2   . LEVOTHYROXINE SODIUM 75 MCG PO TABS   Oral   Take 1 tablet (75 mcg total) by mouth daily.   90 tablet   3   . LOVASTATIN 10 MG PO TABS   Oral   Take 1 tablet (10 mg total) by mouth daily. REF # [id number]   90 tablet   3   . MULTI-VITAMIN PO   Oral   Take 1 tablet by mouth daily.         Marland Kitchen  OMEPRAZOLE 40 MG PO CPDR   Oral   Take 1 capsule (40 mg total) by mouth 2 (two) times daily.   180 capsule   3     BP 126/70  Pulse 97  Temp 98.3 F (36.8 C) (Oral)  Resp 20  SpO2 98%  Physical Exam  Constitutional: She is oriented to person, place, and time. She appears well-developed and well-nourished.  HENT:  Head: Normocephalic and atraumatic.       Mildly dry mucous membranes  Eyes: Conjunctivae normal and EOM are normal. Pupils are equal, round, and reactive to light.  Neck: Trachea normal. Neck supple. No thyromegaly present.  Cardiovascular: Normal rate, regular rhythm, S1 normal, S2 normal and normal pulses.     No systolic murmur is present   No diastolic murmur is present  Pulses:      Radial pulses are 2+ on the right side, and 2+ on the left side.  Pulmonary/Chest: Effort normal and breath sounds normal. She has no wheezes. She has no rhonchi. She has no rales. She exhibits no tenderness.  Abdominal: Soft. Normal appearance and bowel sounds are normal. She exhibits no distension. There is  no tenderness. There is no rebound, no guarding, no CVA tenderness and negative Murphy's sign.       No acute abdomen  Musculoskeletal: She exhibits no edema and no tenderness.  Neurological: She is alert and oriented to person, place, and time. She has normal strength. No cranial nerve deficit or sensory deficit. GCS eye subscore is 4. GCS verbal subscore is 5. GCS motor subscore is 6.  Skin: Skin is warm and dry. No rash noted. She is not diaphoretic.  Psychiatric: Her speech is normal.       Cooperative and appropriate    ED Course  Procedures (including critical care time)  Labs Reviewed  CBC WITH DIFFERENTIAL - Abnormal; Notable for the following:    WBC 15.1 (*)     Neutrophils Relative 93 (*)     Neutro Abs 14.0 (*)     Lymphocytes Relative 3 (*)     Lymphs Abs 0.4 (*)     All other components within normal limits  COMPREHENSIVE METABOLIC PANEL - Abnormal; Notable for the following:    Potassium 3.4 (*)     Glucose, Bld 143 (*)     All other components within normal limits  LIPASE, BLOOD  TROPONIN I  URINALYSIS, ROUTINE W REFLEX MICROSCOPIC   Dg Abd Acute W/chest  06/21/2012  *RADIOLOGY REPORT*  Clinical Data: Cough, nausea and vomiting.  ACUTE ABDOMEN SERIES (ABDOMEN 2 VIEW & CHEST 1 VIEW)  Comparison: None.  Findings: The lungs are well-aerated and clear.  There is no evidence of focal opacification, pleural effusion or pneumothorax. The cardiomediastinal silhouette is borderline normal in size.  An apparent large hiatal hernia is seen.  The visualized bowel gas pattern is unremarkable.  Scattered stool and air are seen within the colon; there is no evidence of small bowel dilatation to suggest obstruction.  No free intra-abdominal air is identified on the provided upright view.  No acute osseous abnormalities are seen; the sacroiliac joints are unremarkable in appearance.  IMPRESSION:  1.  Unremarkable bowel gas pattern; no free intra-abdominal air seen. 2.  No acute  cardiopulmonary process identified. 3.  Large hiatal hernia noted.   Original Report Authenticated By: Tonia Ghent, M.D.     IV fluids. IV Zofran.  iv potassium for mild hypokalemia.  0630 By mouth fluid trial  failed. Patient remains symptomatic. Minimal urine output, able to provide urine sample with UA pending. Continued IV fluids and medicine consult for admit.  7:30 AM d/w [facility] hospitalist on call, will eval for admit.  MDM   Persistent vomiting. Evaluated with imaging and labs as above. Treated with IV fluids IV Zofran. For persistent symptoms medicine consult.        Sunnie Nielsen, MD 06/21/12 [phone]

## 2012-06-22 DIAGNOSIS — D72829 Elevated white blood cell count, unspecified: Secondary | ICD-10-CM | POA: Diagnosis present

## 2012-06-22 DIAGNOSIS — E876 Hypokalemia: Secondary | ICD-10-CM | POA: Diagnosis present

## 2012-06-22 DIAGNOSIS — A084 Viral intestinal infection, unspecified: Secondary | ICD-10-CM | POA: Diagnosis present

## 2012-06-22 DIAGNOSIS — R1115 Cyclical vomiting syndrome unrelated to migraine: Secondary | ICD-10-CM

## 2012-06-22 DIAGNOSIS — R112 Nausea with vomiting, unspecified: Secondary | ICD-10-CM | POA: Diagnosis present

## 2012-06-22 LAB — URINE CULTURE

## 2012-06-22 LAB — COMPREHENSIVE METABOLIC PANEL
AST: 24 U/L (ref 0–37)
Albumin: 2.9 g/dL — ABNORMAL LOW (ref 3.5–5.2)
BUN: 6 mg/dL (ref 6–23)
Calcium: 8.4 mg/dL (ref 8.4–10.5)
Creatinine, Ser: 0.7 mg/dL (ref 0.50–1.10)

## 2012-06-22 LAB — CBC
Hemoglobin: 10.9 g/dL — ABNORMAL LOW (ref 12.0–15.0)
MCH: 30.8 pg (ref 26.0–34.0)
MCV: 92.9 fL (ref 78.0–100.0)
Platelets: 270 10*3/uL (ref 150–400)
RBC: 3.54 MIL/uL — ABNORMAL LOW (ref 3.87–5.11)
WBC: 8.6 10*3/uL (ref 4.0–10.5)

## 2012-06-22 MED ORDER — ONDANSETRON 4 MG PO TBDP: 4.0000 mg | ORAL_TABLET | Freq: Three times a day (TID) | ORAL | Status: DC | PRN

## 2012-06-22 NOTE — Discharge Summary (Signed)
Physician Discharge Summary  Cindy Simpson WJX:[id number] DOB: 07-18-1946 DOA: 06/21/2012  PCP: Cindy Primes, MD  Admit date: 06/21/2012 Discharge date: 06/22/2012  Recommendations for Outpatient Follow-up:  1. No specific recommendations.  Discharge Diagnoses:   Principal Problem:  *Viral gastroenteritis with intractable nausea and vomiting  Active Problems:   HYPOTHYROIDISM   HYPERLIPIDEMIA   GERD   Hypertension   Hypokalemia   Leukocytosis   Discharge Condition: Improved.  Diet recommendation: Low-sodium, heart healthy.  History of present illness:  Cindy Simpson is a 66 year old female with a past medical history of hypertension, hypothyroidism, gastroesophageal reflux disease and hyperlipidemia who was admitted on 06/21/2012 with intractable nausea and vomiting. She also had electrolyte disturbances including hypokalemia.  Hospital Course by problem:  Principal Problem:  *Viral gastroenteritis with intractable nausea and vomiting  Patient was admitted and given supportive care including anti-emetics and IV fluids.  She had no further vomiting overnight and her diet was advanced to regular, which she was able to tolerate prior to discharge. Active Problems:  HYPOTHYROIDISM  Maintained on usual home dose of Synthroid.  HYPERLIPIDEMIA  Resume statin at discharge.  GERD  Continue proton pump inhibitor therapy.  Hypertension  Resume usual home antihypertensives.  Hypokalemia  Corrected.  Leukocytosis  White blood cell count trending down.   Procedures:  None.  Consultations:  None.  Discharge Exam: Filed Vitals:   06/22/12 1318  BP: 122/68  Pulse: 66  Temp: 97.9 F (36.6 C)  Resp: 18   Filed Vitals:   06/21/12 1340 06/21/12 2140 06/22/12 0535 06/22/12 1318  BP: 128/58 122/60 121/62 122/68  Pulse: 74 88 78 66  Temp:  98.8 F (37.1 C) 98.4 F (36.9 C) 97.9 F (36.6 C)  TempSrc:  Oral Oral Oral  Resp:  18 18 18   Height:       Weight:      SpO2:  93% 97% 96%    Gen:  NAD Cardiovascular:  RRR, No M/R/G Respiratory: Lungs CTAB Gastrointestinal: Abdomen soft, NT/ND with normal active bowel sounds. Extremities: No C/E/C   Discharge Instructions      Discharge Orders    Future Appointments: Provider: Department: Dept Phone: Center:   07/17/2012 9:00 AM Cindy Garter, MD [facility] [phone] [facility]       Medication List     As of 06/22/2012  2:37 PM    ASK your doctor about these medications         aspirin 325 MG tablet   Take 325 mg by mouth daily.      Cholecalciferol 1000 UNITS tablet   Take 2,000 Units by mouth daily.      CINNAMON PO   Take by mouth daily.      diltiazem 240 MG 24 hr capsule   Commonly known as: CARDIZEM CD   Take 1 capsule (240 mg total) by mouth daily.      fish oil-omega-3 fatty acids 1000 MG capsule   Take 1 g by mouth daily.      ibuprofen 600 MG tablet   Commonly known as: ADVIL,MOTRIN   Bid pc prn pain      levothyroxine 75 MCG tablet   Commonly known as: SYNTHROID, LEVOTHROID   Take 1 tablet (75 mcg total) by mouth daily.      lovastatin 10 MG tablet   Commonly known as: MEVACOR   Take 1 tablet (10 mg total) by mouth daily. REF # [id number]      MULTI-VITAMIN  PO   Take 1 tablet by mouth daily.      omeprazole 40 MG capsule   Commonly known as: PRILOSEC   Take 1 capsule (40 mg total) by mouth 2 (two) times daily.            The results of significant diagnostics from this hospitalization (including imaging, microbiology, ancillary and laboratory) are listed below for reference.    Significant Diagnostic Studies: Dg Abd Acute W/chest  06/21/2012  *RADIOLOGY REPORT*  Clinical Data: Cough, nausea and vomiting.  ACUTE ABDOMEN SERIES (ABDOMEN 2 VIEW & CHEST 1 VIEW)  Comparison: None.  Findings: The lungs are well-aerated and clear.  There is no evidence of focal opacification, pleural effusion or pneumothorax.  The cardiomediastinal silhouette is borderline normal in size.  An apparent large hiatal hernia is seen.  The visualized bowel gas pattern is unremarkable.  Scattered stool and air are seen within the colon; there is no evidence of small bowel dilatation to suggest obstruction.  No free intra-abdominal air is identified on the provided upright view.  No acute osseous abnormalities are seen; the sacroiliac joints are unremarkable in appearance.  IMPRESSION:  1.  Unremarkable bowel gas pattern; no free intra-abdominal air seen. 2.  No acute cardiopulmonary process identified. 3.  Large hiatal hernia noted.   Original Report Authenticated By: Tonia Ghent, M.D.     Microbiology: Recent Results (from the past 240 hour(s))  URINE CULTURE     Status: Normal   Collection Time   06/21/12  6:28 AM      Component Value Range Status Comment   Specimen Description URINE, RANDOM   Final    Special Requests NONE   Final    Culture  Setup Time 06/21/2012 08:41   Final    Colony Count 75,000 COLONIES/ML   Final    Culture     Final    Value: Multiple bacterial morphotypes present, none predominant. Suggest appropriate recollection if clinically indicated.   Report Status 06/22/2012 FINAL   Final      Labs: Basic Metabolic Panel:  Lab 06/22/12 1324 06/21/12 0355  NA 140 139  K 4.1 3.4*  CL 108 102  CO2 27 23  GLUCOSE 100* 143*  BUN 6 21  CREATININE 0.70 0.63  CALCIUM 8.4 9.0  MG -- --  PHOS -- --   Liver Function Tests:  Lab 06/22/12 0403 06/21/12 0355  AST 24 22  ALT 16 18  ALKPHOS 91 110  BILITOT 0.2* 0.3  PROT 5.6* 7.0  ALBUMIN 2.9* 3.7    Lab 06/21/12 0355  LIPASE 32  AMYLASE --   CBC:  Lab 06/22/12 0403 06/21/12 0355  WBC 8.6 15.1*  NEUTROABS -- 14.0*  HGB 10.9* 12.3  HCT 32.9* 36.2  MCV 92.9 91.2  PLT 270 310   Cardiac Enzymes:  Lab 06/21/12 1553 06/21/12 1037 06/21/12 0355  CKTOTAL -- -- --  CKMB -- -- --  CKMBINDEX -- -- --  TROPONINI <0.30 <0.30 <0.30     Time coordinating discharge: 25 minutes.  Signed:  RAMA,CHRISTINA  Pager [phone] [facility] 06/22/2012, 2:37 PM

## 2012-07-17 ENCOUNTER — Ambulatory Visit (INDEPENDENT_AMBULATORY_CARE_PROVIDER_SITE_OTHER): Payer: Medicare Other | Admitting: Internal Medicine

## 2012-07-17 ENCOUNTER — Encounter: Payer: Self-pay | Admitting: Internal Medicine

## 2012-07-17 VITALS — BP 110/70 | HR 72 | Temp 98.1°F | Resp 16 | Wt 156.0 lb

## 2012-07-17 DIAGNOSIS — M25519 Pain in unspecified shoulder: Secondary | ICD-10-CM

## 2012-07-17 DIAGNOSIS — M25511 Pain in right shoulder: Secondary | ICD-10-CM

## 2012-07-17 NOTE — Assessment & Plan Note (Signed)
Continue with current prescription therapy as reflected on the Med list. [facility]/PT consults and another injection were offered and declined

## 2012-07-17 NOTE — Progress Notes (Signed)
Subjective:    Patient ID: Cindy Simpson, female    DOB: 01/03/1946, 66 y.o.   MRN: [id number]  HPI C/o R shoulder pain - months... It is better The patient presents for a follow-up of  chronic hypertension, chronic dyslipidemia, hypothyroidism controlled with medicines  Wt Readings from Last 3 Encounters:  07/17/12 156 lb (70.761 kg)  06/21/12 161 lb 14.4 oz (73.437 kg)  04/17/12 158 lb 12 oz (72.009 kg)   BP Readings from Last 3 Encounters:  07/17/12 110/70  06/22/12 122/68  04/17/12 120/80      Review of Systems  Constitutional: Negative for chills, activity change, appetite change, fatigue and unexpected weight change.  HENT: Negative for congestion, mouth sores and sinus pressure.   Eyes: Negative for visual disturbance.  Respiratory: Negative for cough and chest tightness.   Gastrointestinal: Negative for nausea and abdominal pain.  Genitourinary: Negative for frequency, difficulty urinating and vaginal pain.  Musculoskeletal: Negative for back pain and gait problem.  Skin: Negative for pallor and rash.  Neurological: Negative for dizziness, tremors, weakness, numbness and headaches.  Psychiatric/Behavioral: Negative for confusion and sleep disturbance. The patient is not nervous/anxious.        Objective:   Physical Exam  Constitutional: She appears well-developed. No distress.       Obese   HENT:  Head: Normocephalic.  Right Ear: External ear normal.  Left Ear: External ear normal.  Nose: Nose normal.  Mouth/Throat: Oropharynx is clear and moist.  Eyes: Conjunctivae normal are normal. Pupils are equal, round, and reactive to light. Right eye exhibits no discharge. Left eye exhibits no discharge.  Neck: Normal range of motion. Neck supple. No JVD present. No tracheal deviation present. No thyromegaly present.  Cardiovascular: Normal rate, regular rhythm and normal heart sounds.   Pulmonary/Chest: No stridor. No respiratory distress. She has no wheezes.    Abdominal: Soft. Bowel sounds are normal. She exhibits no distension and no mass. There is no tenderness. There is no rebound and no guarding.  Musculoskeletal: She exhibits no edema. Tenderness: R subacr space is tender.  Lymphadenopathy:    She has no cervical adenopathy.  Neurological: She displays normal reflexes. No cranial nerve deficit. She exhibits normal muscle tone. Coordination normal.  Skin: No rash noted. No erythema.  Psychiatric: She has a normal mood and affect. Her behavior is normal. Judgment and thought content normal.   Lab Results  Component Value Date   WBC 8.6 06/22/2012   HGB 10.9* 06/22/2012   HCT 32.9* 06/22/2012   PLT 270 06/22/2012   GLUCOSE 100* 06/22/2012   CHOL 199 10/13/2011   TRIG 74.0 10/13/2011   HDL 59.70 10/13/2011   LDLDIRECT 137.5 03/17/2009   LDLCALC 125* 10/13/2011   ALT 16 06/22/2012   AST 24 06/22/2012   NA 140 06/22/2012   K 4.1 06/22/2012   CL 108 06/22/2012   CREATININE 0.70 06/22/2012   BUN 6 06/22/2012   CO2 27 06/22/2012   TSH 3.32 04/16/2011           Assessment & Plan:

## 2012-07-19 HISTORY — PX: CATARACT EXTRACTION: SUR2

## 2012-08-07 ENCOUNTER — Other Ambulatory Visit: Payer: Self-pay | Admitting: Internal Medicine

## 2012-10-03 ENCOUNTER — Other Ambulatory Visit: Payer: Self-pay | Admitting: Internal Medicine

## 2012-10-04 ENCOUNTER — Telehealth: Payer: Self-pay | Admitting: *Deleted

## 2012-10-04 NOTE — Telephone Encounter (Signed)
Pt is requesting alternative mediation to Diltiazem since price is increasing.

## 2012-10-05 NOTE — Telephone Encounter (Signed)
What are  alternatives that are less $$ per her pharmacy? Thx

## 2012-10-06 NOTE — Telephone Encounter (Signed)
Alternatives are Verapamil HCL and Diltiazem HCL.

## 2012-10-08 NOTE — Telephone Encounter (Signed)
Ok Verapamil ER Thx

## 2012-10-09 MED ORDER — VERAPAMIL HCL 240 MG (CO) PO TB24: 240.0000 mg | ORAL_TABLET | Freq: Every day | ORAL | Status: DC

## 2012-10-09 NOTE — Telephone Encounter (Signed)
Pt informed of new rx for Verapamil, rx sent to [facility].

## 2012-10-10 ENCOUNTER — Encounter: Payer: Self-pay | Admitting: Internal Medicine

## 2012-10-16 ENCOUNTER — Ambulatory Visit (INDEPENDENT_AMBULATORY_CARE_PROVIDER_SITE_OTHER): Payer: Medicare Other | Admitting: Internal Medicine

## 2012-10-16 ENCOUNTER — Encounter: Payer: Self-pay | Admitting: Internal Medicine

## 2012-10-16 VITALS — BP 130/80 | HR 76 | Temp 98.4°F | Resp 16 | Wt 160.0 lb

## 2012-10-16 DIAGNOSIS — K130 Diseases of lips: Secondary | ICD-10-CM | POA: Insufficient documentation

## 2012-10-16 MED ORDER — METHYLPREDNISOLONE ACETATE 80 MG/ML IJ SUSP
80.0000 mg | Freq: Once | INTRAMUSCULAR | Status: AC
  Administered 2012-10-16: 80 mg via INTRAMUSCULAR

## 2012-10-16 MED ORDER — TRIAMCINOLONE ACETONIDE 0.5 % EX OINT: TOPICAL_OINTMENT | Freq: Two times a day (BID) | CUTANEOUS | Status: DC

## 2012-10-16 NOTE — Assessment & Plan Note (Signed)
Triamc oint bid-tid Depomedrol 80 mg im Stop using mouthwash.  See what foods can irritate the lips

## 2012-10-16 NOTE — Progress Notes (Signed)
Subjective:    Patient ID: Cindy Simpson, female    DOB: 1946/03/21, 67 y.o.   MRN: [id number]  HPI  C/o lip swelling and cracking x 1.5 months... She went to UC and was given Bactroban and Claritin. No new foods. She has a new mouthwash.  The patient presents for a follow-up of  chronic hypertension, chronic dyslipidemia, hypothyroidism controlled with medicines  Wt Readings from Last 3 Encounters:  10/16/12 160 lb (72.576 kg)  07/17/12 156 lb (70.761 kg)  06/21/12 161 lb 14.4 oz (73.437 kg)   BP Readings from Last 3 Encounters:  10/16/12 130/80  07/17/12 110/70  06/22/12 122/68      Review of Systems  Constitutional: Negative for chills, activity change, appetite change, fatigue and unexpected weight change.  HENT: Negative for congestion, mouth sores and sinus pressure.   Eyes: Negative for visual disturbance.  Respiratory: Negative for cough and chest tightness.   Gastrointestinal: Negative for nausea and abdominal pain.  Genitourinary: Negative for frequency, difficulty urinating and vaginal pain.  Musculoskeletal: Negative for back pain and gait problem.  Skin: Negative for pallor and rash.  Neurological: Negative for dizziness, tremors, weakness, numbness and headaches.  Psychiatric/Behavioral: Negative for confusion and sleep disturbance. The patient is not nervous/anxious.        Objective:   Physical Exam  Constitutional: She appears well-developed. No distress.  Obese   HENT:  Head: Normocephalic.  Right Ear: External ear normal.  Left Ear: External ear normal.  Nose: Nose normal.  Mouth/Throat: Oropharynx is clear and moist.  Eyes: Conjunctivae are normal. Pupils are equal, round, and reactive to light. Right eye exhibits no discharge. Left eye exhibits no discharge.  Neck: Normal range of motion. Neck supple. No JVD present. No tracheal deviation present. No thyromegaly present.  Cardiovascular: Normal rate, regular rhythm and normal heart sounds.    Pulmonary/Chest: No stridor. No respiratory distress. She has no wheezes.  Abdominal: Soft. Bowel sounds are normal. She exhibits no distension and no mass. There is no tenderness. There is no rebound and no guarding.  Musculoskeletal: She exhibits no edema. Tenderness: R subacr space is very tender.  Lymphadenopathy:    She has no cervical adenopathy.  Neurological: She displays normal reflexes. No cranial nerve deficit. She exhibits normal muscle tone. Coordination normal.  Skin: No rash noted. No erythema.  Psychiatric: She has a normal mood and affect. Her behavior is normal. Judgment and thought content normal.  B lips are erythematous and swollen  Lab Results  Component Value Date   WBC 8.6 06/22/2012   HGB 10.9* 06/22/2012   HCT 32.9* 06/22/2012   PLT 270 06/22/2012   GLUCOSE 100* 06/22/2012   CHOL 199 10/13/2011   TRIG 74.0 10/13/2011   HDL 59.70 10/13/2011   LDLDIRECT 137.5 03/17/2009   LDLCALC 125* 10/13/2011   ALT 16 06/22/2012   AST 24 06/22/2012   NA 140 06/22/2012   K 4.1 06/22/2012   CL 108 06/22/2012   CREATININE 0.70 06/22/2012   BUN 6 06/22/2012   CO2 27 06/22/2012   TSH 3.32 04/16/2011        Assessment & Plan:

## 2012-10-16 NOTE — Patient Instructions (Signed)
Stop using mouthwash.  See what foods can irritate the lips

## 2012-10-17 ENCOUNTER — Telehealth: Payer: Self-pay | Admitting: Internal Medicine

## 2012-10-17 ENCOUNTER — Encounter: Payer: Self-pay | Admitting: Internal Medicine

## 2012-10-17 MED ORDER — VERAPAMIL HCL 240 MG (CO) PO TB24: 240.0000 mg | ORAL_TABLET | Freq: Every day | ORAL | Status: DC

## 2012-10-17 NOTE — Telephone Encounter (Signed)
Patient calls because was contacted by Blake Divine [phone]).  On 10/04/12 there is a telephone encounter that notes patients request for a cheaper medication than Diltiazem.   It was ok'ed to be changed to Verapamil,240 mg with new prescription sent to [facility].  They are calling the patient to request the office call and clarify which one is to be dispensed and which is to be discontinued.    Also the patient has only 5 more pills from her Diltiazem-she knows it will take at least 1-2 weeks to get her mail order medicine.  She is requesting samples of the new medications or for a short term prescription to cover the lag time be called in to [facility] on [address] at [phone].  PLEASE FOLLOW UP WITH PATIENT REGARDING THE SAMPLES OR SHORT TERM PRESCRIPTION AND TO CONTACT [facility] TO CLARIFY WHICH MEDICATION NEEDS TO BE FILLED.

## 2012-10-17 NOTE — Telephone Encounter (Signed)
Called [facility] and spoke to a pharmacist advised him to fill Verapamil. He states he will d/c Diltiazem.  14 day supply sent to [facility] [address]. Left detailed mess informing pt.

## 2012-11-10 ENCOUNTER — Encounter: Payer: Self-pay | Admitting: Internal Medicine

## 2012-11-10 ENCOUNTER — Ambulatory Visit (INDEPENDENT_AMBULATORY_CARE_PROVIDER_SITE_OTHER): Payer: Medicare Other | Admitting: Internal Medicine

## 2012-11-10 VITALS — BP 128/80 | HR 80 | Temp 98.0°F | Resp 16 | Wt 161.0 lb

## 2012-11-10 DIAGNOSIS — M2550 Pain in unspecified joint: Secondary | ICD-10-CM

## 2012-11-10 DIAGNOSIS — E785 Hyperlipidemia, unspecified: Secondary | ICD-10-CM

## 2012-11-10 DIAGNOSIS — K130 Diseases of lips: Secondary | ICD-10-CM

## 2012-11-10 DIAGNOSIS — E039 Hypothyroidism, unspecified: Secondary | ICD-10-CM

## 2012-11-10 DIAGNOSIS — K219 Gastro-esophageal reflux disease without esophagitis: Secondary | ICD-10-CM

## 2012-11-10 DIAGNOSIS — J309 Allergic rhinitis, unspecified: Secondary | ICD-10-CM

## 2012-11-10 MED ORDER — TRIAMCINOLONE ACETONIDE 0.1 % MT PSTE: PASTE | Freq: Three times a day (TID) | OROMUCOSAL | Status: DC

## 2012-11-10 NOTE — Assessment & Plan Note (Signed)
Continue with current prescription therapy as reflected on the Med list.  

## 2012-11-10 NOTE — Assessment & Plan Note (Signed)
Hold cinnamon

## 2012-11-10 NOTE — Assessment & Plan Note (Addendum)
4/14 external resolved, internal stayed D/c MVI, cinnamon, Ibuprofen Triamc in dental paste tid  Consider ENT consult

## 2012-11-10 NOTE — Assessment & Plan Note (Signed)
Better  

## 2012-11-10 NOTE — Progress Notes (Signed)
   Subjective:    HPI  C/o lip swelling and cracking x months - much better...   The patient presents for a follow-up of  chronic hypertension, chronic dyslipidemia, hypothyroidism controlled with medicines  Wt Readings from Last 3 Encounters:  11/10/12 161 lb (73.029 kg)  10/16/12 160 lb (72.576 kg)  07/17/12 156 lb (70.761 kg)   BP Readings from Last 3 Encounters:  11/10/12 128/80  10/16/12 130/80  07/17/12 110/70      Review of Systems  Constitutional: Negative for chills, activity change, appetite change, fatigue and unexpected weight change.  HENT: Negative for congestion, mouth sores and sinus pressure.   Eyes: Negative for visual disturbance.  Respiratory: Negative for cough and chest tightness.   Gastrointestinal: Negative for nausea and abdominal pain.  Genitourinary: Negative for frequency, difficulty urinating and vaginal pain.  Musculoskeletal: Negative for back pain and gait problem.  Skin: Negative for pallor and rash.  Neurological: Negative for dizziness, tremors, weakness, numbness and headaches.  Psychiatric/Behavioral: Negative for confusion and sleep disturbance. The patient is not nervous/anxious.        Objective:   Physical Exam  Constitutional: She appears well-developed. No distress.  Obese   HENT:  Head: Normocephalic.  Right Ear: External ear normal.  Left Ear: External ear normal.  Nose: Nose normal.  Mouth/Throat: Oropharynx is clear and moist.  Eyes: Conjunctivae are normal. Pupils are equal, round, and reactive to light. Right eye exhibits no discharge. Left eye exhibits no discharge.  Neck: Normal range of motion. Neck supple. No JVD present. No tracheal deviation present. No thyromegaly present.  Cardiovascular: Normal rate, regular rhythm and normal heart sounds.   Pulmonary/Chest: No stridor. No respiratory distress. She has no wheezes.  Abdominal: Soft. Bowel sounds are normal. She exhibits no distension and no mass. There is no  tenderness. There is no rebound and no guarding.  Musculoskeletal: She exhibits no edema. Tenderness: R subacr space is very tender.  Lymphadenopathy:    She has no cervical adenopathy.  Neurological: She displays normal reflexes. No cranial nerve deficit. She exhibits normal muscle tone. Coordination normal.  Skin: No rash noted. No erythema.  Psychiatric: She has a normal mood and affect. Her behavior is normal. Judgment and thought content normal.  B lips are not erythematous and not swollen - nl Inner lips mucosa w/a 6 mm rim of erosion  Lab Results  Component Value Date   WBC 8.6 06/22/2012   HGB 10.9* 06/22/2012   HCT 32.9* 06/22/2012   PLT 270 06/22/2012   GLUCOSE 100* 06/22/2012   CHOL 199 10/13/2011   TRIG 74.0 10/13/2011   HDL 59.70 10/13/2011   LDLDIRECT 137.5 03/17/2009   LDLCALC 125* 10/13/2011   ALT 16 06/22/2012   AST 24 06/22/2012   NA 140 06/22/2012   K 4.1 06/22/2012   CL 108 06/22/2012   CREATININE 0.70 06/22/2012   BUN 6 06/22/2012   CO2 27 06/22/2012   TSH 3.32 04/16/2011        Assessment & Plan:

## 2012-11-14 ENCOUNTER — Ambulatory Visit: Payer: Medicare Other | Admitting: Internal Medicine

## 2013-02-09 ENCOUNTER — Ambulatory Visit (INDEPENDENT_AMBULATORY_CARE_PROVIDER_SITE_OTHER): Payer: Medicare Other | Admitting: Internal Medicine

## 2013-02-09 ENCOUNTER — Encounter: Payer: Self-pay | Admitting: Internal Medicine

## 2013-02-09 VITALS — BP 120/80 | HR 76 | Temp 98.0°F | Resp 16 | Wt 160.0 lb

## 2013-02-09 DIAGNOSIS — Z0000 Encounter for general adult medical examination without abnormal findings: Secondary | ICD-10-CM

## 2013-02-09 DIAGNOSIS — E785 Hyperlipidemia, unspecified: Secondary | ICD-10-CM

## 2013-02-09 DIAGNOSIS — E876 Hypokalemia: Secondary | ICD-10-CM

## 2013-02-09 DIAGNOSIS — I10 Essential (primary) hypertension: Secondary | ICD-10-CM

## 2013-02-09 NOTE — Assessment & Plan Note (Signed)
Continue with current prescription therapy as reflected on the Med list.  

## 2013-02-09 NOTE — Progress Notes (Signed)
   Subjective:    HPI   The patient presents for a follow-up of  chronic hypertension, chronic dyslipidemia, hypothyroidism controlled with medicines  Wt Readings from Last 3 Encounters:  02/09/13 160 lb (72.576 kg)  11/10/12 161 lb (73.029 kg)  10/16/12 160 lb (72.576 kg)   BP Readings from Last 3 Encounters:  02/09/13 120/80  11/10/12 128/80  10/16/12 130/80      Review of Systems  Constitutional: Negative for chills, activity change, appetite change, fatigue and unexpected weight change.  HENT: Negative for congestion, mouth sores and sinus pressure.   Eyes: Negative for visual disturbance.  Respiratory: Negative for cough and chest tightness.   Gastrointestinal: Negative for nausea and abdominal pain.  Genitourinary: Negative for frequency, difficulty urinating and vaginal pain.  Musculoskeletal: Negative for back pain and gait problem.  Skin: Negative for pallor and rash.  Neurological: Negative for dizziness, tremors, weakness, numbness and headaches.  Psychiatric/Behavioral: Negative for confusion and sleep disturbance. The patient is not nervous/anxious.        Objective:   Physical Exam  Constitutional: She appears well-developed. No distress.  Obese   HENT:  Head: Normocephalic.  Right Ear: External ear normal.  Left Ear: External ear normal.  Nose: Nose normal.  Mouth/Throat: Oropharynx is clear and moist.  Eyes: Conjunctivae are normal. Pupils are equal, round, and reactive to light. Right eye exhibits no discharge. Left eye exhibits no discharge.  Neck: Normal range of motion. Neck supple. No JVD present. No tracheal deviation present. No thyromegaly present.  Cardiovascular: Normal rate, regular rhythm and normal heart sounds.   Pulmonary/Chest: No stridor. No respiratory distress. She has no wheezes.  Abdominal: Soft. Bowel sounds are normal. She exhibits no distension and no mass. There is no tenderness. There is no rebound and no guarding.   Musculoskeletal: She exhibits no edema.  Lymphadenopathy:    She has no cervical adenopathy.  Neurological: She displays normal reflexes. No cranial nerve deficit. She exhibits normal muscle tone. Coordination normal.  Skin: No rash noted. No erythema.  Psychiatric: She has a normal mood and affect. Her behavior is normal. Judgment and thought content normal.  B lips are nl   Lab Results  Component Value Date   WBC 8.6 06/22/2012   HGB 10.9* 06/22/2012   HCT 32.9* 06/22/2012   PLT 270 06/22/2012   GLUCOSE 100* 06/22/2012   CHOL 199 10/13/2011   TRIG 74.0 10/13/2011   HDL 59.70 10/13/2011   LDLDIRECT 137.5 03/17/2009   LDLCALC 125* 10/13/2011   ALT 16 06/22/2012   AST 24 06/22/2012   NA 140 06/22/2012   K 4.1 06/22/2012   CL 108 06/22/2012   CREATININE 0.70 06/22/2012   BUN 6 06/22/2012   CO2 27 06/22/2012   TSH 3.32 04/16/2011        Assessment & Plan:

## 2013-02-09 NOTE — Assessment & Plan Note (Signed)
labs

## 2013-02-09 NOTE — Assessment & Plan Note (Signed)
Chronic She is using cinnamon caps

## 2013-04-24 ENCOUNTER — Telehealth: Payer: Self-pay | Admitting: Internal Medicine

## 2013-04-24 NOTE — Telephone Encounter (Signed)
Recd records from [facility] Forwarding 3 pages to Dr.[name]  °

## 2013-04-30 ENCOUNTER — Other Ambulatory Visit: Payer: Self-pay

## 2013-04-30 MED ORDER — LEVOTHYROXINE SODIUM 75 MCG PO TABS: ORAL_TABLET | ORAL | Status: DC

## 2013-07-09 ENCOUNTER — Other Ambulatory Visit: Payer: Self-pay | Admitting: Internal Medicine

## 2013-08-14 ENCOUNTER — Encounter: Payer: Self-pay | Admitting: Internal Medicine

## 2013-08-14 ENCOUNTER — Ambulatory Visit (INDEPENDENT_AMBULATORY_CARE_PROVIDER_SITE_OTHER): Payer: Medicare Other | Admitting: Internal Medicine

## 2013-08-14 VITALS — BP 120/78 | HR 76 | Temp 97.8°F | Resp 16 | Wt 162.0 lb

## 2013-08-14 DIAGNOSIS — M25511 Pain in right shoulder: Secondary | ICD-10-CM

## 2013-08-14 DIAGNOSIS — R42 Dizziness and giddiness: Secondary | ICD-10-CM

## 2013-08-14 DIAGNOSIS — E039 Hypothyroidism, unspecified: Secondary | ICD-10-CM

## 2013-08-14 DIAGNOSIS — J45909 Unspecified asthma, uncomplicated: Secondary | ICD-10-CM

## 2013-08-14 DIAGNOSIS — M25519 Pain in unspecified shoulder: Secondary | ICD-10-CM

## 2013-08-14 DIAGNOSIS — Z23 Encounter for immunization: Secondary | ICD-10-CM

## 2013-08-14 DIAGNOSIS — I10 Essential (primary) hypertension: Secondary | ICD-10-CM

## 2013-08-14 MED ORDER — MECLIZINE HCL 12.5 MG PO TABS: 12.5000 mg | ORAL_TABLET | Freq: Three times a day (TID) | ORAL | Status: DC | PRN

## 2013-08-14 NOTE — Assessment & Plan Note (Signed)
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 Start Laruth Bouchard - Daroff exercise several times a day as dirrected.

## 2013-08-14 NOTE — Assessment & Plan Note (Signed)
Doing well 

## 2013-08-14 NOTE — Patient Instructions (Signed)
Benign Positional Vertigo symptoms Start Meclizine. Start Brandt - Daroff exercise several times a day as dirrected.  

## 2013-08-14 NOTE — Progress Notes (Signed)
   Subjective:    HPI   The patient presents for a follow-up of  chronic hypertension, chronic dyslipidemia, hypothyroidism controlled with medicines C/o R shoulder pain  Wt Readings from Last 3 Encounters:  08/14/13 162 lb (73.483 kg)  02/09/13 160 lb (72.576 kg)  11/10/12 161 lb (73.029 kg)   BP Readings from Last 3 Encounters:  08/14/13 120/78  02/09/13 120/80  11/10/12 128/80      Review of Systems  Constitutional: Negative for chills, activity change, appetite change, fatigue and unexpected weight change.  HENT: Negative for congestion, mouth sores and sinus pressure.   Eyes: Negative for visual disturbance.  Respiratory: Negative for cough and chest tightness.   Gastrointestinal: Negative for nausea and abdominal pain.  Genitourinary: Negative for frequency, difficulty urinating and vaginal pain.  Musculoskeletal: Negative for back pain and gait problem.  Skin: Negative for pallor and rash.  Neurological: Negative for dizziness, tremors, weakness, numbness and headaches.  Psychiatric/Behavioral: Negative for confusion and sleep disturbance. The patient is not nervous/anxious.        Objective:   Physical Exam  Constitutional: She appears well-developed. No distress.  Obese   HENT:  Head: Normocephalic.  Right Ear: External ear normal.  Left Ear: External ear normal.  Nose: Nose normal.  Mouth/Throat: Oropharynx is clear and moist.  Eyes: Conjunctivae are normal. Pupils are equal, round, and reactive to light. Right eye exhibits no discharge. Left eye exhibits no discharge.  Neck: Normal range of motion. Neck supple. No JVD present. No tracheal deviation present. No thyromegaly present.  Cardiovascular: Normal rate, regular rhythm and normal heart sounds.   Pulmonary/Chest: No stridor. No respiratory distress. She has no wheezes.  Abdominal: Soft. Bowel sounds are normal. She exhibits no distension and no mass. There is no tenderness. There is no rebound and  no guarding.  Musculoskeletal: She exhibits no edema.  Lymphadenopathy:    She has no cervical adenopathy.  Neurological: She displays normal reflexes. No cranial nerve deficit. She exhibits normal muscle tone. Coordination normal.  Skin: No rash noted. No erythema.  Psychiatric: She has a normal mood and affect. Her behavior is normal. Judgment and thought content normal.  B lips are nl R shoulder is sensitive   Lab Results  Component Value Date   WBC 8.6 06/22/2012   HGB 10.9* 06/22/2012   HCT 32.9* 06/22/2012   PLT 270 06/22/2012   GLUCOSE 100* 06/22/2012   CHOL 199 10/13/2011   TRIG 74.0 10/13/2011   HDL 59.70 10/13/2011   LDLDIRECT 137.5 03/17/2009   LDLCALC 125* 10/13/2011   ALT 16 06/22/2012   AST 24 06/22/2012   NA 140 06/22/2012   K 4.1 06/22/2012   CL 108 06/22/2012   CREATININE 0.70 06/22/2012   BUN 6 06/22/2012   CO2 27 06/22/2012   TSH 3.32 04/16/2011        Assessment & Plan:

## 2013-08-14 NOTE — Assessment & Plan Note (Signed)
Continue with current prescription therapy as reflected on the Med list.  

## 2013-08-14 NOTE — Assessment & Plan Note (Signed)
We can inject again

## 2013-10-06 ENCOUNTER — Other Ambulatory Visit: Payer: Self-pay | Admitting: Internal Medicine

## 2013-10-09 ENCOUNTER — Encounter: Payer: Self-pay | Admitting: Internal Medicine

## 2014-01-07 ENCOUNTER — Other Ambulatory Visit: Payer: Self-pay | Admitting: Internal Medicine

## 2014-02-11 ENCOUNTER — Ambulatory Visit: Payer: Medicare Other | Admitting: Internal Medicine

## 2014-02-18 ENCOUNTER — Other Ambulatory Visit (INDEPENDENT_AMBULATORY_CARE_PROVIDER_SITE_OTHER): Payer: Medicare Other

## 2014-02-18 ENCOUNTER — Encounter: Payer: Self-pay | Admitting: Internal Medicine

## 2014-02-18 ENCOUNTER — Other Ambulatory Visit: Payer: Self-pay | Admitting: Internal Medicine

## 2014-02-18 ENCOUNTER — Ambulatory Visit (INDEPENDENT_AMBULATORY_CARE_PROVIDER_SITE_OTHER): Payer: Medicare Other | Admitting: Internal Medicine

## 2014-02-18 VITALS — BP 92/68 | HR 72 | Temp 98.1°F | Resp 16 | Wt 159.0 lb

## 2014-02-18 DIAGNOSIS — E039 Hypothyroidism, unspecified: Secondary | ICD-10-CM

## 2014-02-18 DIAGNOSIS — Z0000 Encounter for general adult medical examination without abnormal findings: Secondary | ICD-10-CM

## 2014-02-18 DIAGNOSIS — E785 Hyperlipidemia, unspecified: Secondary | ICD-10-CM

## 2014-02-18 DIAGNOSIS — L84 Corns and callosities: Secondary | ICD-10-CM | POA: Insufficient documentation

## 2014-02-18 DIAGNOSIS — B351 Tinea unguium: Secondary | ICD-10-CM | POA: Insufficient documentation

## 2014-02-18 DIAGNOSIS — I10 Essential (primary) hypertension: Secondary | ICD-10-CM

## 2014-02-18 DIAGNOSIS — J309 Allergic rhinitis, unspecified: Secondary | ICD-10-CM

## 2014-02-18 LAB — CBC WITH DIFFERENTIAL/PLATELET
BASOS ABS: 0 10*3/uL (ref 0.0–0.1)
Basophils Relative: 0.5 % (ref 0.0–3.0)
EOS ABS: 0.2 10*3/uL (ref 0.0–0.7)
Eosinophils Relative: 3 % (ref 0.0–5.0)
HCT: 39.7 % (ref 36.0–46.0)
HEMOGLOBIN: 13.1 g/dL (ref 12.0–15.0)
LYMPHS ABS: 2.2 10*3/uL (ref 0.7–4.0)
LYMPHS PCT: 28.9 % (ref 12.0–46.0)
MCHC: 33 g/dL (ref 30.0–36.0)
MCV: 91.4 fl (ref 78.0–100.0)
Monocytes Absolute: 0.6 10*3/uL (ref 0.1–1.0)
Monocytes Relative: 8.1 % (ref 3.0–12.0)
NEUTROS ABS: 4.6 10*3/uL (ref 1.4–7.7)
Neutrophils Relative %: 59.5 % (ref 43.0–77.0)
PLATELETS: 337 10*3/uL (ref 150.0–400.0)
RBC: 4.35 Mil/uL (ref 3.87–5.11)
RDW: 13.8 % (ref 11.5–15.5)
WBC: 7.7 10*3/uL (ref 4.0–10.5)

## 2014-02-18 LAB — BASIC METABOLIC PANEL
BUN: 21 mg/dL (ref 6–23)
CALCIUM: 9.4 mg/dL (ref 8.4–10.5)
CO2: 23 meq/L (ref 19–32)
Chloride: 99 mEq/L (ref 96–112)
Creatinine, Ser: 0.8 mg/dL (ref 0.4–1.2)
GFR: 73.55 mL/min (ref >60.00)
GLUCOSE: 84 mg/dL (ref 70–99)
Potassium: 4.3 mEq/L (ref 3.5–5.1)
SODIUM: 135 meq/L (ref 135–145)

## 2014-02-18 LAB — URINALYSIS, ROUTINE W REFLEX MICROSCOPIC
BILIRUBIN URINE: NEGATIVE
Hgb urine dipstick: NEGATIVE
KETONES UR: NEGATIVE
Nitrite: NEGATIVE
PH: 6.5 (ref 5.0–8.0)
RBC / HPF: NONE SEEN (ref 0–?)
Specific Gravity, Urine: 1.01 (ref 1.000–1.030)
TOTAL PROTEIN, URINE-UPE24: NEGATIVE
Urine Glucose: NEGATIVE
Urobilinogen, UA: 0.2 (ref 0.0–1.0)

## 2014-02-18 LAB — HEPATIC FUNCTION PANEL
ALBUMIN: 4.2 g/dL (ref 3.5–5.2)
ALK PHOS: 107 U/L (ref 39–117)
ALT: 17 U/L (ref 0–35)
AST: 24 U/L (ref 0–37)
BILIRUBIN TOTAL: 0.6 mg/dL (ref 0.2–1.2)
Bilirubin, Direct: 0.1 mg/dL (ref 0.0–0.3)
Total Protein: 7.7 g/dL (ref 6.0–8.3)

## 2014-02-18 LAB — LIPID PANEL
CHOLESTEROL: 219 mg/dL — AB (ref 0–200)
HDL: 58.5 mg/dL (ref >39.00)
LDL Cholesterol: 139 mg/dL — ABNORMAL HIGH (ref 0–99)
NonHDL: 160.5
Total CHOL/HDL Ratio: 4
Triglycerides: 110 mg/dL (ref 0.0–149.0)
VLDL: 22 mg/dL (ref 0.0–40.0)

## 2014-02-18 LAB — TSH: TSH: 4.28 u[IU]/mL (ref 0.35–4.50)

## 2014-02-18 MED ORDER — CICLOPIROX 8 % EX SOLN: Freq: Every day | CUTANEOUS | Status: DC

## 2014-02-18 NOTE — Assessment & Plan Note (Addendum)
The patient is here for annual Medicare wellness examination and management of other chronic and acute problems.   The risk factors are reflected in the social history.  The roster of all physicians providing medical care to patient - is listed in the Snapshot section of the chart.  Activities of daily living:  The patient is 100% inedpendent in all ADLs: dressing, toileting, feeding as well as independent mobility  Home safety : The patient has smoke detectors in the home. They wear seatbelts. There is no violence in the home.   There is no risks for hepatitis, STDs or HIV. There is no   history of blood transfusion. They have no travel history to infectious disease endemic areas of the world.  The patient has  seen their dentist in the last 12 month. They have  seen their eye doctor in the last year. They deny  Any major hearing difficulty and have not had audiologic testing in the last year.  They do not  have excessive sun exposure. Discussed the need for sun protection: hats, long sleeves and use of sunscreen if there is significant sun exposure.   Diet: the importance of a healthy diet is discussed. They do have a reasonably healthy  diet.  The patient has a fairly regular exercise program of a mixed nature: walking, yard work, etc.The benefits of regular aerobic exercise were discussed.  Depression screen: there are no signs or vegative symptoms of depression- irritability, change in appetite, anhedonia, sadness/tearfullness.  Cognitive assessment: the patient manages all their financial and personal affairs and is actively engaged. They could relate day,date,year and events; recalled 3/3 objects at 3 minutes  The following portions of the patient's history were reviewed and updated as appropriate: allergies, current medications, past family history, past medical history,  past surgical history, past social history  and problem list.  Vision, hearing, body mass index were assessed and  reviewed.   During the course of the visit the patient was educated and counseled about appropriate screening and preventive services including : fall prevention , diabetes screening, nutrition counseling, colorectal cancer screening, and recommended immunizations. Zostavax is suggested.

## 2014-02-18 NOTE — Assessment & Plan Note (Signed)
Continue with current prescription therapy as reflected on the Med list.  

## 2014-02-18 NOTE — Patient Instructions (Signed)
Preventive Care for Adults A healthy lifestyle and preventive care can promote health and wellness. Preventive health guidelines for women include the following key practices.  A routine yearly physical is a good way to check with your health care provider about your health and preventive screening. It is a chance to share any concerns and updates on your health and to receive a thorough exam.  Visit your dentist for a routine exam and preventive care every 6 months. Brush your teeth twice a day and floss once a day. Good oral hygiene prevents tooth decay and gum disease.  The frequency of eye exams is based on your age, health, family medical history, use of contact lenses, and other factors. Follow your health care provider's recommendations for frequency of eye exams.  Eat a healthy diet. Foods like vegetables, fruits, whole grains, low-fat dairy products, and lean protein foods contain the nutrients you need without too many calories. Decrease your intake of foods high in solid fats, added sugars, and salt. Eat the right amount of calories for you.Get information about a proper diet from your health care provider, if necessary.  Regular physical exercise is one of the most important things you can do for your health. Most adults should get at least 150 minutes of moderate-intensity exercise (any activity that increases your heart rate and causes you to sweat) each week. In addition, most adults need muscle-strengthening exercises on 2 or more days a week.  Maintain a healthy weight. The body mass index (BMI) is a screening tool to identify possible weight problems. It provides an estimate of body fat based on height and weight. Your health care provider can find your BMI and can help you achieve or maintain a healthy weight.For adults 20 years and older:  A BMI below 18.5 is considered underweight.  A BMI of 18.5 to 24.9 is normal.  A BMI of 25 to 29.9 is considered overweight.  A BMI of  30 and above is considered obese.  Maintain normal blood lipids and cholesterol levels by exercising and minimizing your intake of saturated fat. Eat a balanced diet with plenty of fruit and vegetables. Blood tests for lipids and cholesterol should begin at age 76 and be repeated every 5 years. If your lipid or cholesterol levels are high, you are over 50, or you are at high risk for heart disease, you may need your cholesterol levels checked more frequently.Ongoing high lipid and cholesterol levels should be treated with medicines if diet and exercise are not working.  If you smoke, find out from your health care provider how to quit. If you do not use tobacco, do not start.  Lung cancer screening is recommended for adults aged 22-80 years who are at high risk for developing lung cancer because of a history of smoking. A yearly low-dose CT scan of the lungs is recommended for people who have at least a 30-pack-year history of smoking and are a current smoker or have quit within the past 15 years. A pack year of smoking is smoking an average of 1 pack of cigarettes a day for 1 year (for example: 1 pack a day for 30 years or 2 packs a day for 15 years). Yearly screening should continue until the smoker has stopped smoking for at least 15 years. Yearly screening should be stopped for people who develop a health problem that would prevent them from having lung cancer treatment.  If you are pregnant, do not drink alcohol. If you are breastfeeding,  be very cautious about drinking alcohol. If you are not pregnant and choose to drink alcohol, do not have more than 1 drink per day. One drink is considered to be 12 ounces (355 mL) of beer, 5 ounces (148 mL) of wine, or 1.5 ounces (44 mL) of liquor.  Avoid use of street drugs. Do not share needles with anyone. Ask for help if you need support or instructions about stopping the use of drugs.  High blood pressure causes heart disease and increases the risk of  stroke. Your blood pressure should be checked at least every 1 to 2 years. Ongoing high blood pressure should be treated with medicines if weight loss and exercise do not work.  If you are 3-86 years old, ask your health care provider if you should take aspirin to prevent strokes.  Diabetes screening involves taking a blood sample to check your fasting blood sugar level. This should be done once every 3 years, after age 67, if you are within normal weight and without risk factors for diabetes. Testing should be considered at a younger age or be carried out more frequently if you are overweight and have at least 1 risk factor for diabetes.  Breast cancer screening is essential preventive care for women. You should practice "breast self-awareness." This means understanding the normal appearance and feel of your breasts and may include breast self-examination. Any changes detected, no matter how small, should be reported to a health care provider. Women in their 8s and 30s should have a clinical breast exam (CBE) by a health care provider as part of a regular health exam every 1 to 3 years. After age 70, women should have a CBE every year. Starting at age 25, women should consider having a mammogram (breast X-ray test) every year. Women who have a family history of breast cancer should talk to their health care provider about genetic screening. Women at a high risk of breast cancer should talk to their health care providers about having an MRI and a mammogram every year.  Breast cancer gene (BRCA)-related cancer risk assessment is recommended for women who have family members with BRCA-related cancers. BRCA-related cancers include breast, ovarian, tubal, and peritoneal cancers. Having family members with these cancers may be associated with an increased risk for harmful changes (mutations) in the breast cancer genes BRCA1 and BRCA2. Results of the assessment will determine the need for genetic counseling and  BRCA1 and BRCA2 testing.  Routine pelvic exams to screen for cancer are no longer recommended for nonpregnant women who are considered low risk for cancer of the pelvic organs (ovaries, uterus, and vagina) and who do not have symptoms. Ask your health care provider if a screening pelvic exam is right for you.  If you have had past treatment for cervical cancer or a condition that could lead to cancer, you need Pap tests and screening for cancer for at least 20 years after your treatment. If Pap tests have been discontinued, your risk factors (such as having a new sexual partner) need to be reassessed to determine if screening should be resumed. Some women have medical problems that increase the chance of getting cervical cancer. In these cases, your health care provider may recommend more frequent screening and Pap tests.  The HPV test is an additional test that may be used for cervical cancer screening. The HPV test looks for the virus that can cause the cell changes on the cervix. The cells collected during the Pap test can be  tested for HPV. The HPV test could be used to screen women aged 30 years and older, and should be used in women of any age who have unclear Pap test results. After the age of 30, women should have HPV testing at the same frequency as a Pap test.  Colorectal cancer can be detected and often prevented. Most routine colorectal cancer screening begins at the age of 50 years and continues through age 75 years. However, your health care provider may recommend screening at an earlier age if you have risk factors for colon cancer. On a yearly basis, your health care provider may provide home test kits to check for hidden blood in the stool. Use of a small camera at the end of a tube, to directly examine the colon (sigmoidoscopy or colonoscopy), can detect the earliest forms of colorectal cancer. Talk to your health care provider about this at age 50, when routine screening begins. Direct  exam of the colon should be repeated every 5-10 years through age 75 years, unless early forms of pre-cancerous polyps or small growths are found.  People who are at an increased risk for hepatitis B should be screened for this virus. You are considered at high risk for hepatitis B if:  You were born in a country where hepatitis B occurs often. Talk with your health care provider about which countries are considered high risk.  Your parents were born in a high-risk country and you have not received a shot to protect against hepatitis B (hepatitis B vaccine).  You have HIV or AIDS.  You use needles to inject street drugs.  You live with, or have sex with, someone who has hepatitis B.  You get hemodialysis treatment.  You take certain medicines for conditions like cancer, organ transplantation, and autoimmune conditions.  Hepatitis C blood testing is recommended for all people born from 1945 through 1965 and any individual with known risks for hepatitis C.  Practice safe sex. Use condoms and avoid high-risk sexual practices to reduce the spread of sexually transmitted infections (STIs). STIs include gonorrhea, chlamydia, syphilis, trichomonas, herpes, HPV, and human immunodeficiency virus (HIV). Herpes, HIV, and HPV are viral illnesses that have no cure. They can result in disability, cancer, and death.  You should be screened for sexually transmitted illnesses (STIs) including gonorrhea and chlamydia if:  You are sexually active and are younger than 24 years.  You are older than 24 years and your health care provider tells you that you are at risk for this type of infection.  Your sexual activity has changed since you were last screened and you are at an increased risk for chlamydia or gonorrhea. Ask your health care provider if you are at risk.  If you are at risk of being infected with HIV, it is recommended that you take a prescription medicine daily to prevent HIV infection. This is  called preexposure prophylaxis (PrEP). You are considered at risk if:  You are a heterosexual woman, are sexually active, and are at increased risk for HIV infection.  You take drugs by injection.  You are sexually active with a partner who has HIV.  Talk with your health care provider about whether you are at high risk of being infected with HIV. If you choose to begin PrEP, you should first be tested for HIV. You should then be tested every 3 months for as long as you are taking PrEP.  Osteoporosis is a disease in which the bones lose minerals and strength   with aging. This can result in serious bone fractures or breaks. The risk of osteoporosis can be identified using a bone density scan. Women ages 65 years and over and women at risk for fractures or osteoporosis should discuss screening with their health care providers. Ask your health care provider whether you should take a calcium supplement or vitamin D to reduce the rate of osteoporosis.  Menopause can be associated with physical symptoms and risks. Hormone replacement therapy is available to decrease symptoms and risks. You should talk to your health care provider about whether hormone replacement therapy is right for you.  Use sunscreen. Apply sunscreen liberally and repeatedly throughout the day. You should seek shade when your shadow is shorter than you. Protect yourself by wearing long sleeves, pants, a wide-brimmed hat, and sunglasses year round, whenever you are outdoors.  Once a month, do a whole body skin exam, using a mirror to look at the skin on your back. Tell your health care provider of new moles, moles that have irregular borders, moles that are larger than a pencil eraser, or moles that have changed in shape or color.  Stay current with required vaccines (immunizations).  Influenza vaccine. All adults should be immunized every year.  Tetanus, diphtheria, and acellular pertussis (Td, Tdap) vaccine. Pregnant women should  receive 1 dose of Tdap vaccine during each pregnancy. The dose should be obtained regardless of the length of time since the last dose. Immunization is preferred during the 27th-36th week of gestation. An adult who has not previously received Tdap or who does not know her vaccine status should receive 1 dose of Tdap. This initial dose should be followed by tetanus and diphtheria toxoids (Td) booster doses every 10 years. Adults with an unknown or incomplete history of completing a 3-dose immunization series with Td-containing vaccines should begin or complete a primary immunization series including a Tdap dose. Adults should receive a Td booster every 10 years.  Varicella vaccine. An adult without evidence of immunity to varicella should receive 2 doses or a second dose if she has previously received 1 dose. Pregnant females who do not have evidence of immunity should receive the first dose after pregnancy. This first dose should be obtained before leaving the health care facility. The second dose should be obtained 4-8 weeks after the first dose.  Human papillomavirus (HPV) vaccine. Females aged 13-26 years who have not received the vaccine previously should obtain the 3-dose series. The vaccine is not recommended for use in pregnant females. However, pregnancy testing is not needed before receiving a dose. If a female is found to be pregnant after receiving a dose, no treatment is needed. In that case, the remaining doses should be delayed until after the pregnancy. Immunization is recommended for any person with an immunocompromised condition through the age of 26 years if she did not get any or all doses earlier. During the 3-dose series, the second dose should be obtained 4-8 weeks after the first dose. The third dose should be obtained 24 weeks after the first dose and 16 weeks after the second dose.  Zoster vaccine. One dose is recommended for adults aged 60 years or older unless certain conditions are  present.  Measles, mumps, and rubella (MMR) vaccine. Adults born before 1957 generally are considered immune to measles and mumps. Adults born in 1957 or later should have 1 or more doses of MMR vaccine unless there is a contraindication to the vaccine or there is laboratory evidence of immunity to   each of the three diseases. A routine second dose of MMR vaccine should be obtained at least 28 days after the first dose for students attending postsecondary schools, health care workers, or international travelers. People who received inactivated measles vaccine or an unknown type of measles vaccine during 1963-1967 should receive 2 doses of MMR vaccine. People who received inactivated mumps vaccine or an unknown type of mumps vaccine before 1979 and are at high risk for mumps infection should consider immunization with 2 doses of MMR vaccine. For females of childbearing age, rubella immunity should be determined. If there is no evidence of immunity, females who are not pregnant should be vaccinated. If there is no evidence of immunity, females who are pregnant should delay immunization until after pregnancy. Unvaccinated health care workers born before 1957 who lack laboratory evidence of measles, mumps, or rubella immunity or laboratory confirmation of disease should consider measles and mumps immunization with 2 doses of MMR vaccine or rubella immunization with 1 dose of MMR vaccine.  Pneumococcal 13-valent conjugate (PCV13) vaccine. When indicated, a person who is uncertain of her immunization history and has no record of immunization should receive the PCV13 vaccine. An adult aged 19 years or older who has certain medical conditions and has not been previously immunized should receive 1 dose of PCV13 vaccine. This PCV13 should be followed with a dose of pneumococcal polysaccharide (PPSV23) vaccine. The PPSV23 vaccine dose should be obtained at least 8 weeks after the dose of PCV13 vaccine. An adult aged 19  years or older who has certain medical conditions and previously received 1 or more doses of PPSV23 vaccine should receive 1 dose of PCV13. The PCV13 vaccine dose should be obtained 1 or more years after the last PPSV23 vaccine dose.  Pneumococcal polysaccharide (PPSV23) vaccine. When PCV13 is also indicated, PCV13 should be obtained first. All adults aged 65 years and older should be immunized. An adult younger than age 65 years who has certain medical conditions should be immunized. Any person who resides in a nursing home or long-term care facility should be immunized. An adult smoker should be immunized. People with an immunocompromised condition and certain other conditions should receive both PCV13 and PPSV23 vaccines. People with human immunodeficiency virus (HIV) infection should be immunized as soon as possible after diagnosis. Immunization during chemotherapy or radiation therapy should be avoided. Routine use of PPSV23 vaccine is not recommended for American Indians, [place] Natives, or people younger than 65 years unless there are medical conditions that require PPSV23 vaccine. When indicated, people who have unknown immunization and have no record of immunization should receive PPSV23 vaccine. One-time revaccination 5 years after the first dose of PPSV23 is recommended for people aged 19-64 years who have chronic kidney failure, nephrotic syndrome, asplenia, or immunocompromised conditions. People who received 1-2 doses of PPSV23 before age 65 years should receive another dose of PPSV23 vaccine at age 65 years or later if at least 5 years have passed since the previous dose. Doses of PPSV23 are not needed for people immunized with PPSV23 at or after age 65 years.  Meningococcal vaccine. Adults with asplenia or persistent complement component deficiencies should receive 2 doses of quadrivalent meningococcal conjugate (MenACWY-D) vaccine. The doses should be obtained at least 2 months apart.  Microbiologists working with certain meningococcal bacteria, military recruits, people at risk during an outbreak, and people who travel to or live in countries with a high rate of meningitis should be immunized. A first-year college student up through age   21 years who is living in a residence hall should receive a dose if she did not receive a dose on or after her 16th birthday. Adults who have certain high-risk conditions should receive one or more doses of vaccine.  Hepatitis A vaccine. Adults who wish to be protected from this disease, have certain high-risk conditions, work with hepatitis A-infected animals, work in hepatitis A research labs, or travel to or work in countries with a high rate of hepatitis A should be immunized. Adults who were previously unvaccinated and who anticipate close contact with an international adoptee during the first 60 days after arrival in the [place] from a country with a high rate of hepatitis A should be immunized.  Hepatitis B vaccine. Adults who wish to be protected from this disease, have certain high-risk conditions, may be exposed to blood or other infectious body fluids, are household contacts or sex partners of hepatitis B positive people, are clients or workers in certain care facilities, or travel to or work in countries with a high rate of hepatitis B should be immunized.  Haemophilus influenzae type b (Hib) vaccine. A previously unvaccinated person with asplenia or sickle cell disease or having a scheduled splenectomy should receive 1 dose of Hib vaccine. Regardless of previous immunization, a recipient of a hematopoietic stem cell transplant should receive a 3-dose series 6-12 months after her successful transplant. Hib vaccine is not recommended for adults with HIV infection. Preventive Services / Frequency Ages 64 to 68 years  Blood pressure check.** / Every 1 to 2 years.  Lipid and cholesterol check.** / Every 5 years beginning at age  22.  Clinical breast exam.** / Every 3 years for women in their 88s and 53s.  BRCA-related cancer risk assessment.** / For women who have family members with a BRCA-related cancer (breast, ovarian, tubal, or peritoneal cancers).  Pap test.** / Every 2 years from ages 90 through 51. Every 3 years starting at age 21 through age 56 or 3 with a history of 3 consecutive normal Pap tests.  HPV screening.** / Every 3 years from ages 24 through ages 1 to 46 with a history of 3 consecutive normal Pap tests.  Hepatitis C blood test.** / For any individual with known risks for hepatitis C.  Skin self-exam. / Monthly.  Influenza vaccine. / Every year.  Tetanus, diphtheria, and acellular pertussis (Tdap, Td) vaccine.** / Consult your health care provider. Pregnant women should receive 1 dose of Tdap vaccine during each pregnancy. 1 dose of Td every 10 years.  Varicella vaccine.** / Consult your health care provider. Pregnant females who do not have evidence of immunity should receive the first dose after pregnancy.  HPV vaccine. / 3 doses over 6 months, if 72 and younger. The vaccine is not recommended for use in pregnant females. However, pregnancy testing is not needed before receiving a dose.  Measles, mumps, rubella (MMR) vaccine.** / You need at least 1 dose of MMR if you were born in 1957 or later. You may also need a 2nd dose. For females of childbearing age, rubella immunity should be determined. If there is no evidence of immunity, females who are not pregnant should be vaccinated. If there is no evidence of immunity, females who are pregnant should delay immunization until after pregnancy.  Pneumococcal 13-valent conjugate (PCV13) vaccine.** / Consult your health care provider.  Pneumococcal polysaccharide (PPSV23) vaccine.** / 1 to 2 doses if you smoke cigarettes or if you have certain conditions.  Meningococcal vaccine.** /  1 dose if you are age 19 to 21 years and a first-year college  student living in a residence hall, or have one of several medical conditions, you need to get vaccinated against meningococcal disease. You may also need additional booster doses.  Hepatitis A vaccine.** / Consult your health care provider.  Hepatitis B vaccine.** / Consult your health care provider.  Haemophilus influenzae type b (Hib) vaccine.** / Consult your health care provider. Ages 40 to 64 years  Blood pressure check.** / Every 1 to 2 years.  Lipid and cholesterol check.** / Every 5 years beginning at age 20 years.  Lung cancer screening. / Every year if you are aged 55-80 years and have a 30-pack-year history of smoking and currently smoke or have quit within the past 15 years. Yearly screening is stopped once you have quit smoking for at least 15 years or develop a health problem that would prevent you from having lung cancer treatment.  Clinical breast exam.** / Every year after age 40 years.  BRCA-related cancer risk assessment.** / For women who have family members with a BRCA-related cancer (breast, ovarian, tubal, or peritoneal cancers).  Mammogram.** / Every year beginning at age 40 years and continuing for as long as you are in good health. Consult with your health care provider.  Pap test.** / Every 3 years starting at age 30 years through age 65 or 70 years with a history of 3 consecutive normal Pap tests.  HPV screening.** / Every 3 years from ages 30 years through ages 65 to 70 years with a history of 3 consecutive normal Pap tests.  Fecal occult blood test (FOBT) of stool. / Every year beginning at age 50 years and continuing until age 75 years. You may not need to do this test if you get a colonoscopy every 10 years.  Flexible sigmoidoscopy or colonoscopy.** / Every 5 years for a flexible sigmoidoscopy or every 10 years for a colonoscopy beginning at age 50 years and continuing until age 75 years.  Hepatitis C blood test.** / For all people born from 1945 through  1965 and any individual with known risks for hepatitis C.  Skin self-exam. / Monthly.  Influenza vaccine. / Every year.  Tetanus, diphtheria, and acellular pertussis (Tdap/Td) vaccine.** / Consult your health care provider. Pregnant women should receive 1 dose of Tdap vaccine during each pregnancy. 1 dose of Td every 10 years.  Varicella vaccine.** / Consult your health care provider. Pregnant females who do not have evidence of immunity should receive the first dose after pregnancy.  Zoster vaccine.** / 1 dose for adults aged 60 years or older.  Measles, mumps, rubella (MMR) vaccine.** / You need at least 1 dose of MMR if you were born in 1957 or later. You may also need a 2nd dose. For females of childbearing age, rubella immunity should be determined. If there is no evidence of immunity, females who are not pregnant should be vaccinated. If there is no evidence of immunity, females who are pregnant should delay immunization until after pregnancy.  Pneumococcal 13-valent conjugate (PCV13) vaccine.** / Consult your health care provider.  Pneumococcal polysaccharide (PPSV23) vaccine.** / 1 to 2 doses if you smoke cigarettes or if you have certain conditions.  Meningococcal vaccine.** / Consult your health care provider.  Hepatitis A vaccine.** / Consult your health care provider.  Hepatitis B vaccine.** / Consult your health care provider.  Haemophilus influenzae type b (Hib) vaccine.** / Consult your health care provider. Ages 65   years and over  Blood pressure check.** / Every 1 to 2 years.  Lipid and cholesterol check.** / Every 5 years beginning at age 22 years.  Lung cancer screening. / Every year if you are aged 73-80 years and have a 30-pack-year history of smoking and currently smoke or have quit within the past 15 years. Yearly screening is stopped once you have quit smoking for at least 15 years or develop a health problem that would prevent you from having lung cancer  treatment.  Clinical breast exam.** / Every year after age 4 years.  BRCA-related cancer risk assessment.** / For women who have family members with a BRCA-related cancer (breast, ovarian, tubal, or peritoneal cancers).  Mammogram.** / Every year beginning at age 40 years and continuing for as long as you are in good health. Consult with your health care provider.  Pap test.** / Every 3 years starting at age 9 years through age 34 or 91 years with 3 consecutive normal Pap tests. Testing can be stopped between 65 and 70 years with 3 consecutive normal Pap tests and no abnormal Pap or HPV tests in the past 10 years.  HPV screening.** / Every 3 years from ages 57 years through ages 64 or 45 years with a history of 3 consecutive normal Pap tests. Testing can be stopped between 65 and 70 years with 3 consecutive normal Pap tests and no abnormal Pap or HPV tests in the past 10 years.  Fecal occult blood test (FOBT) of stool. / Every year beginning at age 15 years and continuing until age 17 years. You may not need to do this test if you get a colonoscopy every 10 years.  Flexible sigmoidoscopy or colonoscopy.** / Every 5 years for a flexible sigmoidoscopy or every 10 years for a colonoscopy beginning at age 86 years and continuing until age 71 years.  Hepatitis C blood test.** / For all people born from [birth year] through 1966 and any individual with known risks for hepatitis C.  Osteoporosis screening.** / A one-time screening for women ages 83 years and over and women at risk for fractures or osteoporosis.  Skin self-exam. / Monthly.  Influenza vaccine. / Every year.  Tetanus, diphtheria, and acellular pertussis (Tdap/Td) vaccine.** / 1 dose of Td every 10 years.  Varicella vaccine.** / Consult your health care provider.  Zoster vaccine.** / 1 dose for adults aged 61 years or older.  Pneumococcal 13-valent conjugate (PCV13) vaccine.** / Consult your health care provider.  Pneumococcal  polysaccharide (PPSV23) vaccine.** / 1 dose for all adults aged 28 years and older.  Meningococcal vaccine.** / Consult your health care provider.  Hepatitis A vaccine.** / Consult your health care provider.  Hepatitis B vaccine.** / Consult your health care provider.  Haemophilus influenzae type b (Hib) vaccine.** / Consult your health care provider. ** Family history and personal history of risk and conditions may change your health care provider's recommendations. Document Released: 08/31/2001 Document Revised: 11/19/2013 Document Reviewed: 11/30/2010 [facility] Patient Information 2015 [place], [place]. This information is not intended to replace advice given to you by your health care provider. Make sure you discuss any questions you have with your health care provider.

## 2014-02-18 NOTE — Progress Notes (Signed)
   Subjective:    HPI  The patient is here for a wellness exam. The patient has been doing well overall without major physical or psychological issues going on lately.  The patient presents for a follow-up of  chronic hypertension, chronic dyslipidemia, hypothyroidism controlled with medicines  Wt Readings from Last 3 Encounters:  02/18/14 159 lb (72.122 kg)  08/14/13 162 lb (73.483 kg)  02/09/13 160 lb (72.576 kg)   BP Readings from Last 3 Encounters:  02/18/14 92/68  08/14/13 120/78  02/09/13 120/80      Review of Systems  Constitutional: Negative for chills, activity change, appetite change, fatigue and unexpected weight change.  HENT: Negative for congestion, mouth sores and sinus pressure.   Eyes: Negative for visual disturbance.  Respiratory: Negative for cough and chest tightness.   Gastrointestinal: Negative for nausea and abdominal pain.  Genitourinary: Negative for frequency, difficulty urinating and vaginal pain.  Musculoskeletal: Negative for back pain and gait problem.  Skin: Negative for pallor and rash.  Neurological: Negative for dizziness, tremors, weakness, numbness and headaches.  Psychiatric/Behavioral: Negative for confusion and sleep disturbance. The patient is not nervous/anxious.        Objective:   Physical Exam  Constitutional: She appears well-developed. No distress.  Obese   HENT:  Head: Normocephalic.  Right Ear: External ear normal.  Left Ear: External ear normal.  Nose: Nose normal.  Mouth/Throat: Oropharynx is clear and moist.  Eyes: Conjunctivae are normal. Pupils are equal, round, and reactive to light. Right eye exhibits no discharge. Left eye exhibits no discharge.  Neck: Normal range of motion. Neck supple. No JVD present. No tracheal deviation present. No thyromegaly present.  Cardiovascular: Normal rate, regular rhythm and normal heart sounds.   Pulmonary/Chest: No stridor. No respiratory distress. She has no wheezes.   Abdominal: Soft. Bowel sounds are normal. She exhibits no distension and no mass. There is no tenderness. There is no rebound and no guarding.  Musculoskeletal: She exhibits no edema.  Lymphadenopathy:    She has no cervical adenopathy.  Neurological: She displays normal reflexes. No cranial nerve deficit. She exhibits normal muscle tone. Coordination normal.  Skin: No rash noted. No erythema.  Psychiatric: She has a normal mood and affect. Her behavior is normal. Judgment and thought content normal.  toenails w/onycho B calluses    Lab Results  Component Value Date   WBC 8.6 06/22/2012   HGB 10.9* 06/22/2012   HCT 32.9* 06/22/2012   PLT 270 06/22/2012   GLUCOSE 100* 06/22/2012   CHOL 199 10/13/2011   TRIG 74.0 10/13/2011   HDL 59.70 10/13/2011   LDLDIRECT 137.5 03/17/2009   LDLCALC 125* 10/13/2011   ALT 16 06/22/2012   AST 24 06/22/2012   NA 140 06/22/2012   K 4.1 06/22/2012   CL 108 06/22/2012   CREATININE 0.70 06/22/2012   BUN 6 06/22/2012   CO2 27 06/22/2012   TSH 3.32 04/16/2011        Assessment & Plan:

## 2014-02-18 NOTE — Assessment & Plan Note (Signed)
Offered removal/trim

## 2014-02-18 NOTE — Progress Notes (Signed)
Pre visit review using our clinic review tool, if applicable. No additional management support is needed unless otherwise documented below in the visit note. 

## 2014-02-18 NOTE — Assessment & Plan Note (Signed)
8/15 toenails

## 2014-03-29 ENCOUNTER — Other Ambulatory Visit: Payer: Self-pay | Admitting: Internal Medicine

## 2014-05-20 ENCOUNTER — Ambulatory Visit (INDEPENDENT_AMBULATORY_CARE_PROVIDER_SITE_OTHER): Payer: Medicare Other | Admitting: Emergency Medicine

## 2014-05-20 ENCOUNTER — Ambulatory Visit (INDEPENDENT_AMBULATORY_CARE_PROVIDER_SITE_OTHER): Payer: Medicare Other

## 2014-05-20 VITALS — BP 138/84 | HR 78 | Temp 98.1°F | Resp 16 | Ht 62.0 in | Wt 163.0 lb

## 2014-05-20 DIAGNOSIS — M79605 Pain in left leg: Secondary | ICD-10-CM

## 2014-05-20 DIAGNOSIS — M79602 Pain in left arm: Secondary | ICD-10-CM

## 2014-05-20 DIAGNOSIS — T148XXA Other injury of unspecified body region, initial encounter: Secondary | ICD-10-CM

## 2014-05-20 DIAGNOSIS — S81812A Laceration without foreign body, left lower leg, initial encounter: Secondary | ICD-10-CM

## 2014-05-20 DIAGNOSIS — T148 Other injury of unspecified body region: Secondary | ICD-10-CM

## 2014-05-20 NOTE — Progress Notes (Signed)
[facility] [address] [place] [address] [phone]  Date:  05/20/2014   Name:  Cindy Simpson   DOB:  12-14-1945   MRN:  [id number]  PCP:  [name], MD    Chief Complaint: Leg Injury   History of Present Illness:  Cindy Simpson is a 68 y.o. very pleasant female patient who presents with the following:  A month ago fell against a block in the yard.  Has pain and swelling in mid anterolateral lower leg.  Has avulsion that has scabbed Current on tetanus.   Says she tripped and fell, denies syncope or vertigo.   Concerned as pain is persistent Denies other complaint or health concern today.   Patient Active Problem List   Diagnosis Date Noted  . Onychomycosis 02/18/2014  . Callus of foot 02/18/2014  . Dizziness 08/14/2013  . Cheilitis 10/16/2012  . Hypokalemia 06/22/2012  . Leukocytosis 06/22/2012  . Viral gastroenteritis with intractable nausea and vomiting 06/22/2012  . Well adult exam 04/17/2012  . Right shoulder pain 04/17/2012  . Hypertension 10/16/2010  . HIP PAIN 03/24/2010  . UPPER RESPIRATORY INFECTION, ACUTE 10/20/2009  . ARTHRALGIA 10/20/2009  . FATIGUE 10/20/2009  . HYPOTHYROIDISM 03/15/2008  . HYPERLIPIDEMIA 03/15/2008  . INSOMNIA, PERSISTENT 03/15/2008  . ALLERGIC RHINITIS 03/15/2008  . ASTHMA 03/15/2008  . GERD 03/15/2008    Past Medical History  Diagnosis Date  . GERD (gastroesophageal reflux disease)   . Hyperlipidemia   . Hypothyroidism   . Allergic rhinitis   . IBS (irritable bowel syndrome)   . Asthmatic bronchitis 2009  . Insomnia     Past Surgical History  Procedure Laterality Date  . Abdominal hysterectomy      History  Substance Use Topics  . Smoking status: Never Smoker   . Smokeless tobacco: Never Used  . Alcohol Use: No    Family History  Problem Relation Age of Onset  . Hypertension Other   . Hypertension Mother     Allergies  Allergen Reactions  . Metoclopramide Hcl      Medication list has been reviewed and updated.  Current Outpatient Prescriptions on File Prior to Visit  Medication Sig Dispense Refill  . aspirin 325 MG tablet Take 325 mg by mouth daily.      . Cholecalciferol 1000 UNITS tablet Take 2,000 Units by mouth daily.    . ciclopirox (PENLAC) 8 % solution Apply topically at bedtime. Apply over nail and surrounding skin. Apply daily over previous coat. After seven (7) days, may remove with alcohol and continue cycle. 6.6 mL 0  . fish oil-omega-3 fatty acids 1000 MG capsule Take 1 g by mouth daily.    Marland Kitchen levothyroxine (SYNTHROID, LEVOTHROID) 75 MCG tablet Take 1 tablet by mouth  daily 90 tablet 3  . loratadine (CLARITIN) 10 MG tablet Take 10 mg by mouth daily.    Marland Kitchen lovastatin (MEVACOR) 10 MG tablet Take 1 tablet by mouth  daily 90 tablet 3  . meclizine (ANTIVERT) 12.5 MG tablet Take 1 tablet (12.5 mg total) by mouth 3 (three) times daily as needed for dizziness. 60 tablet 1  . omeprazole (PRILOSEC) 40 MG capsule Take 1 capsule by mouth    twice a day 180 capsule 3  . ondansetron (ZOFRAN ODT) 4 MG disintegrating tablet Take 1 tablet (4 mg total) by mouth every 8 (eight) hours as needed for nausea. 20 tablet 0  . triamcinolone (KENALOG) 0.1 % paste Place onto teeth 3 (three)  times daily. Apply to inner lips tid 5 g 1  . triamcinolone ointment (KENALOG) 0.5 % Apply topically 2 (two) times daily. 15 g 1  . verapamil (CALAN-SR) 240 MG CR tablet Take 1 tablet by mouth at  bedtime 90 tablet 3   No current facility-administered medications on file prior to visit.    Review of Systems:  As per HPI, otherwise negative.    Physical Examination: Filed Vitals:   05/20/14 1336  BP: 138/84  Pulse: 78  Temp: 98.1 F (36.7 C)  Resp: 16   Filed Vitals:   05/20/14 1336  Height: 5\' 2"  (1.575 m)  Weight: 163 lb (73.936 kg)   Body mass index is 29.81 kg/(m^2). Ideal Body Weight: Weight in (lb) to have BMI = 25: 136.4   GEN: WDWN, NAD, Non-toxic,  Alert & Oriented x 3 HEENT: Atraumatic, Normocephalic.  Ears and Nose: No external deformity. EXTR: No clubbing/cyanosis/edema NEURO: Normal gait.  PSYCH: Normally interactive. Conversant. Not depressed or anxious appearing.  Calm demeanor.  LEFT lower leg:  Healing avulsion with swelling particularly superiorly.  No cellulitis.  Quite tender locally  Assessment and Plan: Contusion leg Laceration leg Local heat OTC NSAID   Signed,  Phillips Odor, MD   [facility] reading (PRIMARY) by  Dr. Dareen Piano.  negative.

## 2014-05-20 NOTE — Patient Instructions (Signed)
Contusion °A contusion is a deep bruise. Contusions are the result of an injury that caused bleeding under the skin. The contusion may turn blue, purple, or yellow. Minor injuries will give you a painless contusion, but more severe contusions may stay painful and swollen for a few weeks.  °CAUSES  °A contusion is usually caused by a blow, trauma, or direct force to an area of the body. °SYMPTOMS  °· Swelling and redness of the injured area. °· Bruising of the injured area. °· Tenderness and soreness of the injured area. °· Pain. °DIAGNOSIS  °The diagnosis can be made by taking a history and physical exam. An X-ray, CT scan, or MRI may be needed to determine if there were any associated injuries, such as fractures. °TREATMENT  °Specific treatment will depend on what area of the body was injured. In general, the best treatment for a contusion is resting, icing, elevating, and applying cold compresses to the injured area. Over-the-counter medicines may also be recommended for pain control. Ask your caregiver what the best treatment is for your contusion. °HOME CARE INSTRUCTIONS  °· Put ice on the injured area. °¨ Put ice in a plastic bag. °¨ Place a towel between your skin and the bag. °¨ Leave the ice on for 15-20 minutes, 3-4 times a day, or as directed by your health care provider. °· Only take over-the-counter or prescription medicines for pain, discomfort, or fever as directed by your caregiver. Your caregiver may recommend avoiding anti-inflammatory medicines (aspirin, ibuprofen, and naproxen) for 48 hours because these medicines may increase bruising. °· Rest the injured area. °· If possible, elevate the injured area to reduce swelling. °SEEK IMMEDIATE MEDICAL CARE IF:  °· You have increased bruising or swelling. °· You have pain that is getting worse. °· Your swelling or pain is not relieved with medicines. °MAKE SURE YOU:  °· Understand these instructions. °· Will watch your condition. °· Will get help right  away if you are not doing well or get worse. °Document Released: 04/14/2005 Document Revised: 07/10/2013 Document Reviewed: 05/10/2011 °[employer]® Patient Information ©2015 [employer]. This information is not intended to replace advice given to you by your health care provider. Make sure you discuss any questions you have with your health care provider. ° °

## 2014-07-24 ENCOUNTER — Telehealth (HOSPITAL_COMMUNITY): Payer: Self-pay | Admitting: *Deleted

## 2014-07-24 NOTE — Telephone Encounter (Signed)
called to follow-up if pt had colon cancer screening done yet but nobody was picking up the phone on both telephone nos. provided.

## 2014-08-21 ENCOUNTER — Encounter: Payer: Self-pay | Admitting: Internal Medicine

## 2014-08-21 ENCOUNTER — Ambulatory Visit (INDEPENDENT_AMBULATORY_CARE_PROVIDER_SITE_OTHER): Payer: Medicare Other | Admitting: Internal Medicine

## 2014-08-21 ENCOUNTER — Ambulatory Visit (INDEPENDENT_AMBULATORY_CARE_PROVIDER_SITE_OTHER)
Admission: RE | Admit: 2014-08-21 | Discharge: 2014-08-21 | Disposition: A | Discharge status: Home / Self Care | Payer: Medicare Other | Source: Ambulatory Visit | Attending: Internal Medicine | Admitting: Internal Medicine

## 2014-08-21 VITALS — BP 124/74 | HR 80 | Temp 98.5°F | Wt 160.0 lb

## 2014-08-21 DIAGNOSIS — R05 Cough: Secondary | ICD-10-CM | POA: Diagnosis not present

## 2014-08-21 DIAGNOSIS — J069 Acute upper respiratory infection, unspecified: Secondary | ICD-10-CM | POA: Diagnosis not present

## 2014-08-21 DIAGNOSIS — R509 Fever, unspecified: Secondary | ICD-10-CM | POA: Diagnosis not present

## 2014-08-21 MED ORDER — PROMETHAZINE-CODEINE 6.25-10 MG/5ML PO SYRP: 5.0000 mL | ORAL_SOLUTION | ORAL | Status: DC | PRN

## 2014-08-21 MED ORDER — AZITHROMYCIN 250 MG PO TABS: ORAL_TABLET | ORAL | Status: DC

## 2014-08-21 NOTE — Assessment & Plan Note (Signed)
CXR Zpac Prom-cod Syr prn

## 2014-08-21 NOTE — Progress Notes (Signed)
   Subjective:    HPI   C/o URI sx's since last Friday  Wt Readings from Last 3 Encounters:  08/21/14 160 lb (72.576 kg)  05/20/14 163 lb (73.936 kg)  02/18/14 159 lb (72.122 kg)   BP Readings from Last 3 Encounters:  08/21/14 124/74  05/20/14 138/84  02/18/14 92/68      Review of Systems  Constitutional: Negative for chills, activity change, appetite change, fatigue and unexpected weight change.  HENT: Negative for congestion, mouth sores and sinus pressure.   Eyes: Negative for visual disturbance.  Respiratory: Negative for cough and chest tightness.   Gastrointestinal: Negative for nausea and abdominal pain.  Genitourinary: Negative for frequency, difficulty urinating and vaginal pain.  Musculoskeletal: Negative for back pain and gait problem.  Skin: Negative for pallor and rash.  Neurological: Negative for dizziness, tremors, weakness, numbness and headaches.  Psychiatric/Behavioral: Negative for confusion and sleep disturbance. The patient is not nervous/anxious.        Objective:   Physical Exam  Constitutional: She appears well-developed. No distress.  Obese   HENT:  Head: Normocephalic.  Right Ear: External ear normal.  Left Ear: External ear normal.  Nose: Nose normal.  Mouth/Throat: Oropharynx is clear and moist.  Eyes: Conjunctivae are normal. Pupils are equal, round, and reactive to light. Right eye exhibits no discharge. Left eye exhibits no discharge.  Neck: Normal range of motion. Neck supple. No JVD present. No tracheal deviation present. No thyromegaly present.  Cardiovascular: Normal rate, regular rhythm and normal heart sounds.   Pulmonary/Chest: No stridor. No respiratory distress. She has no wheezes.  Abdominal: Soft. Bowel sounds are normal. She exhibits no distension and no mass. There is no tenderness. There is no rebound and no guarding.  Musculoskeletal: She exhibits no edema.  Lymphadenopathy:    She has no cervical adenopathy.   Neurological: She displays normal reflexes. No cranial nerve deficit. She exhibits normal muscle tone. Coordination normal.  Skin: No rash noted. No erythema.  Psychiatric: She has a normal mood and affect. Her behavior is normal. Judgment and thought content normal.  Hoarse  Coarse BS B Eryth throat   Lab Results  Component Value Date   WBC 7.7 02/18/2014   HGB 13.1 02/18/2014   HCT 39.7 02/18/2014   PLT 337.0 02/18/2014   GLUCOSE 84 02/18/2014   CHOL 219* 02/18/2014   TRIG 110.0 02/18/2014   HDL 58.50 02/18/2014   LDLDIRECT 137.5 03/17/2009   LDLCALC 139* 02/18/2014   ALT 17 02/18/2014   AST 24 02/18/2014   NA 135 02/18/2014   K 4.3 02/18/2014   CL 99 02/18/2014   CREATININE 0.8 02/18/2014   BUN 21 02/18/2014   CO2 23 02/18/2014   TSH 4.28 02/18/2014        Assessment & Plan:

## 2014-08-21 NOTE — Progress Notes (Signed)
Pre visit review using our clinic review tool, if applicable. No additional management support is needed unless otherwise documented below in the visit note. 

## 2014-08-30 ENCOUNTER — Ambulatory Visit (INDEPENDENT_AMBULATORY_CARE_PROVIDER_SITE_OTHER): Payer: Medicare Other | Admitting: Internal Medicine

## 2014-08-30 ENCOUNTER — Encounter: Payer: Self-pay | Admitting: Internal Medicine

## 2014-08-30 VITALS — BP 120/100 | HR 85 | Temp 98.2°F | Ht 62.0 in | Wt 161.8 lb

## 2014-08-30 DIAGNOSIS — J069 Acute upper respiratory infection, unspecified: Secondary | ICD-10-CM | POA: Diagnosis not present

## 2014-08-30 DIAGNOSIS — J40 Bronchitis, not specified as acute or chronic: Secondary | ICD-10-CM

## 2014-08-30 DIAGNOSIS — J209 Acute bronchitis, unspecified: Secondary | ICD-10-CM

## 2014-08-30 MED ORDER — AMOXICILLIN-POT CLAVULANATE 875-125 MG PO TABS: 1.0000 | ORAL_TABLET | Freq: Two times a day (BID) | ORAL | Status: DC

## 2014-08-30 MED ORDER — PREDNISONE 20 MG PO TABS: 20.0000 mg | ORAL_TABLET | Freq: Two times a day (BID) | ORAL | Status: DC

## 2014-08-30 NOTE — Patient Instructions (Signed)
Symbicort two inhalations every 12 hours; gargle and spit after use  Plain Mucinex (NOT D) for thick secretions ;force NON dairy fluids .   Nasal cleansing in the shower as discussed with lather of mild shampoo.After 10 seconds wash off lather while  exhaling through nostrils. Make sure that all residual soap is removed to prevent irritation.  Flonase OR Nasacort AQ 1 spray in each nostril twice a day as needed. Use the "crossover" technique into opposite nostril spraying toward opposite ear @ 45 degree angle, not straight up into nostril.  Plain Allegra (NOT D )  160 daily , Loratidine 10 mg , OR Zyrtec 10 mg @ bedtime  as needed for itchy eyes & sneezing.

## 2014-08-30 NOTE — Progress Notes (Signed)
Pre visit review using our clinic review tool, if applicable. No additional management support is needed unless otherwise documented below in the visit note. 

## 2014-08-30 NOTE — Progress Notes (Signed)
Subjective:    Patient ID: Cindy Simpson, female    DOB: [birth year], 69 y.o.   MRN: [id number]  HPI She describes a cough since December 26 or 27th, 2015. CXR was negative 08/21/14; Zpack Rxed that day was ineffective. She has paroxysms of cough lasting several minutes. She states that the secretions get to her throat but she cannot expectorate them. This is the context of significant head congestion and postnasal drainage. Intermittently she's had some green nasal discharge as well.  She had a temperature up to 101.5 last week associated with chills.  She describes intrinsive symptoms of sneezing and wheezing.  She has the Symbicort sample which she's not been using. Previously did she did find this of benefit.  She has been using Afrin up to twice a day.   She's never smoked. She states there is some question of her having asthma (Dx is listed in Problem List).  Additionally she has some reflux despite taking PPI twice a day.  Review of Systems  She denies frontal headache or facial pain. She has no otic pain or otic discharge.    Objective:   Physical Exam  Positive or pertinent findings include: She exhibits hyponasal speech pattern. There is mild erythema of the right tympanic membrane. She is sniffling constantly during the interview. Some nasal edema & erythema present She has an upper dental plate. She has a loud S4. She has diffuse low-grade musical rhonchi in all lung fields.  General appearance:Adequately nourished; no acute distress or increased work of breathing is present.  No  lymphadenopathy about the head, neck, or axilla noted.  Eyes: No conjunctival inflammation or lid edema is present. There is no scleral icterus. Ears:  External ear exam shows no significant lesions or deformities.  Otoscopic examination reveals  no bulging, retraction, or discharge. Nose:  External nasal examination shows no deformity or inflammation. Oral exam: There is no  oropharyngeal erythema or exudate noted.  Neck:  No deformities, thyromegaly, masses, or tenderness noted.   Supple with full range of motion without pain.  Heart:  Normal rate and regular rhythm. S1 and S2 normal without gallop, murmur, click, rub or other extra sounds.  Extremities:  No cyanosis, edema, or clubbing  noted  Skin: Warm & dry w/o jaundice or tenting.      Assessment & Plan:  #1 asthmatic bronchitis  #2 upper respiratory tract infection  #3 elevated diastolic BP; ? Not on decongestants  Plan: See orders recommendations

## 2014-10-29 ENCOUNTER — Other Ambulatory Visit: Payer: Self-pay

## 2014-10-29 ENCOUNTER — Ambulatory Visit (INDEPENDENT_AMBULATORY_CARE_PROVIDER_SITE_OTHER): Payer: Medicare Other

## 2014-10-29 VITALS — BP 124/80 | HR 84 | Ht 63.0 in | Wt 158.0 lb

## 2014-10-29 DIAGNOSIS — M8580 Other specified disorders of bone density and structure, unspecified site: Secondary | ICD-10-CM

## 2014-10-29 DIAGNOSIS — Z1231 Encounter for screening mammogram for malignant neoplasm of breast: Secondary | ICD-10-CM | POA: Diagnosis not present

## 2014-10-29 DIAGNOSIS — Z0000 Encounter for general adult medical examination without abnormal findings: Secondary | ICD-10-CM

## 2014-10-29 NOTE — Patient Instructions (Addendum)
Cindy Simpson , Thank you for taking time to come for your Medicare Wellness Visit. I appreciate your ongoing commitment to your health goals. Please review the following plan we discussed and let me know if I can assist you in the future.   Summary:  Well visit instructions printed Given info regarding shingles vaccination and will think about it  Vision apt: TBS this year   Hearing apt: deferred; had one post retirement; hearing well  Dental: had one in Feb 2016; every 6 months   No cognitives issues identified;  MMSE :30   No longer has GI doctor; reviewed screening for colonoscopy and given information   BMI reviewed with add'l recommendations for BMI >30; loves to cook [place] food, but will increase fruits and vegetables and exercise by playing golf  Fall risk reduction reviewed; discussed balancing exercises; and silver sneakers; does have treadmill in home;  Bone density overdue and ordered before July 2016 OTC meds reviewed Care Providers reviewed; [facility] only   These are the goals we discussed: Goals    . Exercise 3x per week (30 min per time)     Likes to work in the yard; Is very busy; Is not sedentary; does not sit around; Keeps moving    . Weight < 200 lb (90.719 kg)     Weight: goal is to not gain; Likes pasta; eats fruit as desert; Stays mentally happy; low stress; Enjoys to play golf and will attempt to start playing        This is a list of the screening recommended for you and due dates:  Health Maintenance  Topic Date Due  . Shingles Vaccine  07/08/2005  . DEXA scan (bone density measurement)  07/08/2010  . Colon Cancer Screening  03/11/2011  . Flu Shot  02/17/2015  . Mammogram  09/27/2015  . Tetanus Vaccine  09/16/2019  . Pneumonia vaccines  Completed    Shingles Shingles (herpes zoster) is an infection that is caused by the same virus that causes chickenpox (varicella). The infection causes a painful skin rash and fluid-filled  blisters, which eventually break open, crust over, and heal. It may occur in any area of the body, but it usually affects only one side of the body or face. The pain of shingles usually lasts about 1 month. However, some people with shingles may develop long-term (chronic) pain in the affected area of the body. Shingles often occurs many years after the person had chickenpox. It is more common:  In people older than 50 years.  In people with weakened immune systems, such as those with HIV, AIDS, or cancer.  In people taking medicines that weaken the immune system, such as transplant medicines.  In people under great stress. CAUSES  Shingles is caused by the varicella zoster virus (VZV), which also causes chickenpox. After a person is infected with the virus, it can remain in the person's body for years in an inactive state (dormant). To cause shingles, the virus reactivates and breaks out as an infection in a nerve root. The virus can be spread from person to person (contagious) through contact with open blisters of the shingles rash. It will only spread to people who have not had chickenpox. When these people are exposed to the virus, they may develop chickenpox. They will not develop shingles. Once the blisters scab over, the person is no longer contagious and cannot spread the virus to others. SIGNS AND SYMPTOMS  Shingles shows up in stages. The initial symptoms  may be pain, itching, and tingling in an area of the skin. This pain is usually described as burning, stabbing, or throbbing.In a few days or weeks, a painful red rash will appear in the area where the pain, itching, and tingling were felt. The rash is usually on one side of the body in a band or belt-like pattern. Then, the rash usually turns into fluid-filled blisters. They will scab over and dry up in approximately 2-3 weeks. Flu-like symptoms may also occur with the initial symptoms, the rash, or the blisters. These may  include:  Fever.  Chills.  Headache.  Upset stomach. DIAGNOSIS  Your health care provider will perform a skin exam to diagnose shingles. Skin scrapings or fluid samples may also be taken from the blisters. This sample will be examined under a microscope or sent to a lab for further testing. TREATMENT  There is no specific cure for shingles. Your health care provider will likely prescribe medicines to help you manage the pain, recover faster, and avoid long-term problems. This may include antiviral drugs, anti-inflammatory drugs, and pain medicines. HOME CARE INSTRUCTIONS   Take a cool bath or apply cool compresses to the area of the rash or blisters as directed. This may help with the pain and itching.   Take medicines only as directed by your health care provider.   Rest as directed by your health care provider.  Keep your rash and blisters clean with mild soap and cool water or as directed by your health care provider.  Do not pick your blisters or scratch your rash. Apply an anti-itch cream or numbing creams to the affected area as directed by your health care provider.  Keep your shingles rash covered with a loose bandage (dressing).  Avoid skin contact with:  Babies.   Pregnant women.   Children with eczema.   Elderly people with transplants.   People with chronic illnesses, such as leukemia or AIDS.   Wear loose-fitting clothing to help ease the pain of material rubbing against the rash.  Keep all follow-up visits as directed by your health care provider.If the area involved is on your face, you may receive a referral for a specialist, such as an eye doctor (ophthalmologist) or an ear, nose, and throat (ENT) doctor. Keeping all follow-up visits will help you avoid eye problems, chronic pain, or disability.  SEEK IMMEDIATE MEDICAL CARE IF:   You have facial pain, pain around the eye area, or loss of feeling on one side of your face.  You have ear pain or  ringing in your ear.  You have loss of taste.  Your pain is not relieved with prescribed medicines.   Your redness or swelling spreads.   You have more pain and swelling.  Your condition is worsening or has changed.   You have a fever. MAKE SURE YOU:  Understand these instructions.  Will watch your condition.  Will get help right away if you are not doing well or get worse. Document Released: 07/05/2005 Document Revised: 11/19/2013 Document Reviewed: 02/17/2012 [employer] Patient Information 2015 [employer]. This information is not intended to replace advice given to you by your health care provider. Make sure you discuss any questions you have with your health care provider.  Fat and Cholesterol Control Diet Your diet has an affect on your fat and cholesterol levels in your blood and organs. Too much fat and cholesterol in your blood can affect your:  Heart.  Blood vessels (arteries, veins).  Gallbladder.    Liver.  Pancreas. CONTROL FAT AND CHOLESTEROL WITH DIET Certain foods raise cholesterol and others lower it. It is important to replace bad fats with other types of fat.  Do not eat:  Fatty meats, such as hot dogs and salami.  Stick margarine and some tub margarines that have "partially hydrogenated oils" in them.  Baked goods, such as cookies and crackers that have "partially hydrogenated oils" in them.  Saturated tropical oils, such as coconut and palm oil. Eat the following foods:  Round or loin cuts of red meat.  Chicken (without skin).  Fish.  Veal.  Ground turkey breast.  Shellfish.  Fruit, such as apples.  Vegetables, such as broccoli, potatoes, and carrots.  Beans, peas, and lentils (legumes).  Grains, such as barley, rice, couscous, and bulgar wheat.  Pasta (without cream sauces). Look for foods that are nonfat, low in fat, and low in cholesterol.  FIND FOODS THAT ARE LOWER IN FAT AND CHOLESTEROL  Find foods with soluble fiber  and plant sterols (phytosterol). You should eat 2 grams a day of these foods. These foods include:  Fruits.  Vegetables.  Whole grains.  Dried beans and peas.  Nuts and seeds.  Read package labels. Look for low-saturated fats, trans fat free, low-fat foods.  Choose cheese that have only 2 to 3 grams of saturated fat per ounce.  Use heart-healthy tub margarine that is free of trans fat or partially hydrogenated oil.  Avoid buying baked goods that have partially hydrogenated oils in them. Instead, buy baked goods made with whole grains (whole-wheat or whole oat flour). Avoid baked goods labeled with "flour" or "enriched flour."  Buy non-creamy canned soups with reduced salt and no added fats. PREPARING YOUR FOOD  Broil, bake, steam, or roast foods. Do not fry food.  Use non-stick cooking sprays.  Use lemon or herbs to flavor food instead of using butter or stick margarine.  Use nonfat yogurt, salsa, or low-fat dressings for salads. LOW-SATURATED FAT / LOW-FAT FOOD SUBSTITUTES  Meats / Saturated Fat (g)  Avoid: Steak, marbled (3 oz/85 g) / 11 g.  Choose: Steak, lean (3 oz/85 g) / 4 g.  Avoid: Hamburger (3 oz/85 g) / 7 g.  Choose: Hamburger, lean (3 oz/85 g) / 5 g.  Avoid: Ham (3 oz/85 g) / 6 g.  Choose: Ham, lean cut (3 oz/85 g) / 2.4 g.  Avoid: Chicken, with skin, dark meat (3 oz/85 g) / 4 g.  Choose: Chicken, skin removed, dark meat (3 oz/85 g) / 2 g.  Avoid: Chicken, with skin, light meat (3 oz/85 g) / 2.5 g.  Choose: Chicken, skin removed, light meat (3 oz/85 g) / 1 g. Dairy / Saturated Fat (g)  Avoid: Whole milk (1 cup) / 5 g.  Choose: Low-fat milk, 2% (1 cup) / 3 g.  Choose: Low-fat milk, 1% (1 cup) / 1.5 g.  Choose: Skim milk (1 cup) / 0.3 g.  Avoid: Hard cheese (1 oz/28 g) / 6 g.  Choose: Skim milk cheese (1 oz/28 g) / 2 to 3 g.  Avoid: Cottage cheese, 4% fat (1 cup) / 6.5 g.  Choose: Low-fat cottage cheese, 1% fat (1 cup) / 1.5 g.  Avoid:  Ice cream (1 cup) / 9 g.  Choose: Sherbet (1 cup) / 2.5 g.  Choose: Nonfat frozen yogurt (1 cup) / 0.3 g.  Choose: Frozen fruit bar / trace.  Avoid: Whipped cream (1 tbs) / 3.5 g.  Choose: Nondairy whipped topping (1 tbs) /   1 g. Condiments / Saturated Fat (g)  Avoid: Mayonnaise (1 tbs) / 2 g.  Choose: Low-fat mayonnaise (1 tbs) / 1 g.  Avoid: Butter (1 tbs) / 7 g.  Choose: Extra light margarine (1 tbs) / 1 g.  Avoid: Coconut oil (1 tbs) / 11.8 g.  Choose: Olive oil (1 tbs) / 1.8 g.  Choose: Corn oil (1 tbs) / 1.7 g.  Choose: Safflower oil (1 tbs) / 1.2 g.  Choose: Sunflower oil (1 tbs) / 1.4 g.  Choose: Soybean oil (1 tbs) / 2.4 g .  Choose: Canola oil (1 tbs) / 1 g. Document Released: 01/04/2012 Document Revised: 03/07/2013 Document Reviewed: 10/04/2013 [facility] Patient Information 2015 [place], [place]. This information is not intended to replace advice given to you by your health care provider. Make sure you discuss any questions you have with your health care provider.  Fall Prevention and Home Safety Falls cause injuries and can affect all age groups. It is possible to prevent falls.  HOW TO PREVENT FALLS  Wear shoes with rubber soles that do not have an opening for your toes.  Keep the inside and outside of your house well lit.  Use night lights throughout your home.  Remove clutter from floors.  Clean up floor spills.  Remove throw rugs or fasten them to the floor with carpet tape.  Do not place electrical cords across pathways.  Put grab bars by your tub, shower, and toilet. Do not use towel bars as grab bars.  Put handrails on both sides of the stairway. Fix loose handrails.  Do not climb on stools or stepladders, if possible.  Do not wax your floors.  Repair uneven or unsafe sidewalks, walkways, or stairs.  Keep items you use a lot within reach.  Be aware of pets.  Keep emergency numbers next to the telephone.  Put smoke detectors in  your home and near bedrooms. Ask your doctor what other things you can do to prevent falls. Document Released: 05/01/2009 Document Revised: 01/04/2012 Document Reviewed: 10/05/2011 [facility] Patient Information 2015 [place], [place]. This information is not intended to replace advice given to you by your health care provider. Make sure you discuss any questions you have with your health care provider.  Health Maintenance Adopting a healthy lifestyle and getting preventive care can go a long way to promote health and wellness. Talk with your health care provider about what schedule of regular examinations is right for you. This is a good chance for you to check in with your provider about disease prevention and staying healthy. In between checkups, there are plenty of things you can do on your own. Experts have done a lot of research about which lifestyle changes and preventive measures are most likely to keep you healthy. Ask your health care provider for more information. WEIGHT AND DIET  Eat a healthy diet  Be sure to include plenty of vegetables, fruits, low-fat dairy products, and lean protein.  Do not eat a lot of foods high in solid fats, added sugars, or salt.  Get regular exercise. This is one of the most important things you can do for your health.  Most adults should exercise for at least 150 minutes each week. The exercise should increase your heart rate and make you sweat (moderate-intensity exercise).  Most adults should also do strengthening exercises at least twice a week. This is in addition to the moderate-intensity exercise.  Maintain a healthy weight  Body mass index (BMI) is a measurement that  can be used to identify possible weight problems. It estimates body fat based on height and weight. Your health care provider can help determine your BMI and help you achieve or maintain a healthy weight.  For females 64 years of age and older:   A BMI below 18.5 is considered  underweight.  A BMI of 18.5 to 24.9 is normal.  A BMI of 25 to 29.9 is considered overweight.  A BMI of 30 and above is considered obese.  Watch levels of cholesterol and blood lipids  You should start having your blood tested for lipids and cholesterol at 70 years of age, then have this test every 5 years.  You may need to have your cholesterol levels checked more often if:  Your lipid or cholesterol levels are high.  You are older than 70 years of age.  You are at high risk for heart disease.  CANCER SCREENING   Lung Cancer  Lung cancer screening is recommended for adults 19-8 years old who are at high risk for lung cancer because of a history of smoking.  A yearly low-dose CT scan of the lungs is recommended for people who:  Currently smoke.  Have quit within the past 15 years.  Have at least a 30-pack-year history of smoking. A pack year is smoking an average of one pack of cigarettes a day for 1 year.  Yearly screening should continue until it has been 15 years since you quit.  Yearly screening should stop if you develop a health problem that would prevent you from having lung cancer treatment.  Breast Cancer  Practice breast self-awareness. This means understanding how your breasts normally appear and feel.  It also means doing regular breast self-exams. Let your health care provider know about any changes, no matter how small.  If you are in your 20s or 30s, you should have a clinical breast exam (CBE) by a health care provider every 1-3 years as part of a regular health exam.  If you are 66 or older, have a CBE every year. Also consider having a breast X-ray (mammogram) every year.  If you have a family history of breast cancer, talk to your health care provider about genetic screening.  If you are at high risk for breast cancer, talk to your health care provider about having an MRI and a mammogram every year.  Breast cancer gene (BRCA) assessment is  recommended for women who have family members with BRCA-related cancers. BRCA-related cancers include:  Breast.  Ovarian.  Tubal.  Peritoneal cancers.  Results of the assessment will determine the need for genetic counseling and BRCA1 and BRCA2 testing. Cervical Cancer Routine pelvic examinations to screen for cervical cancer are no longer recommended for nonpregnant women who are considered low risk for cancer of the pelvic organs (ovaries, uterus, and vagina) and who do not have symptoms. A pelvic examination may be necessary if you have symptoms including those associated with pelvic infections. Ask your health care provider if a screening pelvic exam is right for you.   The Pap test is the screening test for cervical cancer for women who are considered at risk.  If you had a hysterectomy for a problem that was not cancer or a condition that could lead to cancer, then you no longer need Pap tests.  If you are older than 65 years, and you have had normal Pap tests for the past 10 years, you no longer need to have Pap tests.  If you have  had past treatment for cervical cancer or a condition that could lead to cancer, you need Pap tests and screening for cancer for at least 20 years after your treatment.  If you no longer get a Pap test, assess your risk factors if they change (such as having a new sexual partner). This can affect whether you should start being screened again.  Some women have medical problems that increase their chance of getting cervical cancer. If this is the case for you, your health care provider may recommend more frequent screening and Pap tests.  The human papillomavirus (HPV) test is another test that may be used for cervical cancer screening. The HPV test looks for the virus that can cause cell changes in the cervix. The cells collected during the Pap test can be tested for HPV.  The HPV test can be used to screen women 64 years of age and older. Getting tested  for HPV can extend the interval between normal Pap tests from three to five years.  An HPV test also should be used to screen women of any age who have unclear Pap test results.  After 70 years of age, women should have HPV testing as often as Pap tests.  Colorectal Cancer  This type of cancer can be detected and often prevented.  Routine colorectal cancer screening usually begins at 70 years of age and continues through 70 years of age.  Your health care provider may recommend screening at an earlier age if you have risk factors for colon cancer.  Your health care provider may also recommend using home test kits to check for hidden blood in the stool.  A small camera at the end of a tube can be used to examine your colon directly (sigmoidoscopy or colonoscopy). This is done to check for the earliest forms of colorectal cancer.  Routine screening usually begins at age 14.  Direct examination of the colon should be repeated every 5-10 years through 70 years of age. However, you may need to be screened more often if early forms of precancerous polyps or small growths are found. Skin Cancer  Check your skin from head to toe regularly.  Tell your health care provider about any new moles or changes in moles, especially if there is a change in a mole's shape or color.  Also tell your health care provider if you have a mole that is larger than the size of a pencil eraser.  Always use sunscreen. Apply sunscreen liberally and repeatedly throughout the day.  Protect yourself by wearing long sleeves, pants, a wide-brimmed hat, and sunglasses whenever you are outside. HEART DISEASE, DIABETES, AND HIGH BLOOD PRESSURE   Have your blood pressure checked at least every 1-2 years. High blood pressure causes heart disease and increases the risk of stroke.  If you are between 29 years and 23 years old, ask your health care provider if you should take aspirin to prevent strokes.  Have regular  diabetes screenings. This involves taking a blood sample to check your fasting blood sugar level.  If you are at a normal weight and have a low risk for diabetes, have this test once every three years after 69 years of age.  If you are overweight and have a high risk for diabetes, consider being tested at a younger age or more often. PREVENTING INFECTION  Hepatitis B  If you have a higher risk for hepatitis B, you should be screened for this virus. You are considered at high risk  for hepatitis B if:  You were born in a country where hepatitis B is common. Ask your health care provider which countries are considered high risk.  Your parents were born in a high-risk country, and you have not been immunized against hepatitis B (hepatitis B vaccine).  You have HIV or AIDS.  You use needles to inject street drugs.  You live with someone who has hepatitis B.  You have had sex with someone who has hepatitis B.  You get hemodialysis treatment.  You take certain medicines for conditions, including cancer, organ transplantation, and autoimmune conditions. Hepatitis C  Blood testing is recommended for:  Everyone born from 1945 through 1965.  Anyone with known risk factors for hepatitis C. Sexually transmitted infections (STIs)  You should be screened for sexually transmitted infections (STIs) including gonorrhea and chlamydia if:  You are sexually active and are younger than 70 years of age.  You are older than 70 years of age and your health care provider tells you that you are at risk for this type of infection.  Your sexual activity has changed since you were last screened and you are at an increased risk for chlamydia or gonorrhea. Ask your health care provider if you are at risk.  If you do not have HIV, but are at risk, it may be recommended that you take a prescription medicine daily to prevent HIV infection. This is called pre-exposure prophylaxis (PrEP). You are considered at  risk if:  You are sexually active and do not regularly use condoms or know the HIV status of your partner(s).  You take drugs by injection.  You are sexually active with a partner who has HIV. Talk with your health care provider about whether you are at high risk of being infected with HIV. If you choose to begin PrEP, you should first be tested for HIV. You should then be tested every 3 months for as long as you are taking PrEP.  PREGNANCY   If you are premenopausal and you may become pregnant, ask your health care provider about preconception counseling.  If you may become pregnant, take 400 to 800 micrograms (mcg) of folic acid every day.  If you want to prevent pregnancy, talk to your health care provider about birth control (contraception). OSTEOPOROSIS AND MENOPAUSE   Osteoporosis is a disease in which the bones lose minerals and strength with aging. This can result in serious bone fractures. Your risk for osteoporosis can be identified using a bone density scan.  If you are 65 years of age or older, or if you are at risk for osteoporosis and fractures, ask your health care provider if you should be screened.  Ask your health care provider whether you should take a calcium or vitamin D supplement to lower your risk for osteoporosis.  Menopause may have certain physical symptoms and risks.  Hormone replacement therapy may reduce some of these symptoms and risks. Talk to your health care provider about whether hormone replacement therapy is right for you.  HOME CARE INSTRUCTIONS   Schedule regular health, dental, and eye exams.  Stay current with your immunizations.   Do not use any tobacco products including cigarettes, chewing tobacco, or electronic cigarettes.  If you are pregnant, do not drink alcohol.  If you are breastfeeding, limit how much and how often you drink alcohol.  Limit alcohol intake to no more than 1 drink per day for nonpregnant women. One drink equals  12 ounces of beer, 5 ounces   of wine, or 1 ounces of hard liquor.  Do not use street drugs.  Do not share needles.  Ask your health care provider for help if you need support or information about quitting drugs.  Tell your health care provider if you often feel depressed.  Tell your health care provider if you have ever been abused or do not feel safe at home. Document Released: 01/18/2011 Document Revised: 11/19/2013 Document Reviewed: 06/06/2013 [facility] Patient Information 2015 [place], [place]. This information is not intended to replace advice given to you by your health care provider. Make sure you discuss any questions you have with your health care provider.

## 2014-10-29 NOTE — Progress Notes (Signed)
Subjective:   Cindy Simpson is a 69 y.o. female who presents for Medicare Annual (Subsequent) preventive examination.       Objective:     Vitals: BP 124/80 mmHg  Pulse 84  Ht 5\' 3"  (1.6 m)  Wt 158 lb (71.668 kg)  BMI 28.00 kg/m2  SpO2 96%  Tobacco History  Smoking status  . Never Smoker   Smokeless tobacco  . Never Used     Counseling given: No   Past Medical History  Diagnosis Date  . GERD (gastroesophageal reflux disease)   . Hyperlipidemia   . Hypothyroidism   . Allergic rhinitis   . IBS (irritable bowel syndrome)   . Insomnia   . Asthmatic bronchitis 2009    allergies; post nasal drip but not allergies  . Torticollis 2006    Estimate date; rec'd botox and resolved  . Irregular heart beat Not sure; long time ago    Periodic irregular heart rate; take verapamil.   Past Surgical History  Procedure Laterality Date  . Abdominal hysterectomy    . Cataract extraction Right 2014    approx 2 years ago  . Tubal ligation Bilateral 1985   Family History  Problem Relation Age of Onset  . Hypertension Other   . Hypertension Mother   . Heart disease Father   . Stroke Father    History  Sexual Activity  . Sexual Activity: No    Outpatient Encounter Prescriptions as of 10/29/2014  Medication Sig  . aspirin 325 MG tablet Take 325 mg by mouth daily.    . calcium-vitamin D (OSCAL WITH D) 500-200 MG-UNIT per tablet Take 1 tablet by mouth daily with breakfast.  . Cholecalciferol 1000 UNITS tablet Take 2,000 Units by mouth daily.  . fish oil-omega-3 fatty acids 1000 MG capsule Take 1 g by mouth daily.  Marland Kitchen levothyroxine (SYNTHROID, LEVOTHROID) 75 MCG tablet Take 1 tablet by mouth  daily  . loratadine (CLARITIN) 10 MG tablet Take 10 mg by mouth daily.  Marland Kitchen lovastatin (MEVACOR) 10 MG tablet Take 1 tablet by mouth  daily  . omeprazole (PRILOSEC) 40 MG capsule Take 1 capsule by mouth    twice a day  . verapamil (CALAN-SR) 240 MG CR tablet Take 1 tablet by mouth at   bedtime  . [DISCONTINUED] amoxicillin-clavulanate (AUGMENTIN) 875-125 MG per tablet Take 1 tablet by mouth every 12 (twelve) hours. Take with meal  . [DISCONTINUED] predniSONE (DELTASONE) 20 MG tablet Take 1 tablet (20 mg total) by mouth 2 (two) times daily.    Activities of Daily Living In your present state of health, do you have any difficulty performing the following activities: 10/29/2014 08/30/2014  Hearing? N Y  Vision? N N  Difficulty concentrating or making decisions? N N  Walking or climbing stairs? N N  Dressing or bathing? N N  Doing errands, shopping? N N    Patient Care Team: Cassandria Anger, MD as PCP - General    Assessment:   Exercise Activities and Dietary recommendations    Goals    . Exercise 3x per week (30 min per time)     Likes to work in the yard; Is very busy; Is not sedentary; does not sit around; Keeps moving    . Weight < 200 lb (90.719 kg)     Weight: goal is to not gain; Likes pasta; eats fruit as desert; Stays mentally happy; low stress; Enjoys to play golf and will attempt to start playing  Fall Risk Fall Risk  10/29/2014 08/30/2014  Falls in the past year? Yes Yes  Number falls in past yr: 1 1  Injury with Fall? No Yes  Risk for fall due to : Other (Comment) -  Risk for fall due to (comments): Educated on balance; silver sneakers; treadmill in the bedroom -   Depression Screen PHQ 2/9 Scores 10/29/2014 08/30/2014  PHQ - 2 Score 0 1     Cognitive Testing No flowsheet data found.  Immunization History  Administered Date(s) Administered  . Influenza Split 04/03/2012  . Influenza Whole 03/24/2010, 04/19/2011  . Influenza-Unspecified 03/19/2013  . Pneumococcal Conjugate-13 08/14/2013  . Pneumococcal Polysaccharide-23 04/16/2011  . Td 09/15/2009   Screening Tests Health Maintenance  Topic Date Due  . ZOSTAVAX  07/08/2005  . DEXA SCAN  07/08/2010  . COLONOSCOPY  03/11/2011  . INFLUENZA VACCINE  02/17/2015  . MAMMOGRAM   09/27/2015  . TETANUS/TDAP  09/16/2019  . PNA vac Low Risk Adult  Completed      Plan:  To have Dexa scan and patient will schedule  During the course of the visit the patient was educated and counseled about the following appropriate screening and preventive services:   Vaccines to include Pneumoccal, Influenza, Hepatitis B, Td, Zostavax, HCV/ will think about shingles  Electrocardiogram  Cardiovascular Disease  Colorectal cancer screening  Bone density screening  Diabetes screening  Glaucoma screening  Mammography/PAP  Nutrition counseling   Patient Instructions (the written plan) was given to the patient.   Wynetta Fines, RN  10/29/2014

## 2014-12-10 ENCOUNTER — Other Ambulatory Visit: Payer: Self-pay | Admitting: Internal Medicine

## 2014-12-20 ENCOUNTER — Ambulatory Visit (INDEPENDENT_AMBULATORY_CARE_PROVIDER_SITE_OTHER)
Admission: RE | Admit: 2014-12-20 | Discharge: 2014-12-20 | Disposition: A | Discharge status: Home / Self Care | Payer: Medicare Other | Source: Ambulatory Visit | Attending: Internal Medicine | Admitting: Internal Medicine

## 2014-12-20 DIAGNOSIS — M8580 Other specified disorders of bone density and structure, unspecified site: Secondary | ICD-10-CM | POA: Diagnosis not present

## 2014-12-23 ENCOUNTER — Telehealth: Payer: Self-pay | Admitting: Internal Medicine

## 2014-12-23 MED ORDER — LOVASTATIN 10 MG PO TABS: ORAL_TABLET | ORAL | Status: DC

## 2014-12-23 MED ORDER — OMEPRAZOLE 40 MG PO CPDR
DELAYED_RELEASE_CAPSULE | ORAL | Status: DC
Start: 2014-12-23 — End: 2015-12-01

## 2014-12-23 NOTE — Telephone Encounter (Signed)
Patient is requesting her scripts to be changed to get refilled at the same time on prilosec and lovastatin through [facility].

## 2014-12-23 NOTE — Telephone Encounter (Signed)
Sent scripts to [facility]...Cindy Simpson

## 2015-01-02 ENCOUNTER — Encounter: Payer: Self-pay | Admitting: Internal Medicine

## 2015-01-08 DIAGNOSIS — H5212 Myopia, left eye: Secondary | ICD-10-CM | POA: Diagnosis not present

## 2015-01-08 DIAGNOSIS — Z961 Presence of intraocular lens: Secondary | ICD-10-CM | POA: Diagnosis not present

## 2015-01-08 DIAGNOSIS — H2512 Age-related nuclear cataract, left eye: Secondary | ICD-10-CM | POA: Diagnosis not present

## 2015-02-28 ENCOUNTER — Other Ambulatory Visit (INDEPENDENT_AMBULATORY_CARE_PROVIDER_SITE_OTHER): Payer: Medicare Other

## 2015-02-28 ENCOUNTER — Encounter: Payer: Self-pay | Admitting: Internal Medicine

## 2015-02-28 ENCOUNTER — Ambulatory Visit (INDEPENDENT_AMBULATORY_CARE_PROVIDER_SITE_OTHER): Payer: Medicare Other | Admitting: Internal Medicine

## 2015-02-28 VITALS — BP 112/82 | HR 81 | Ht 63.0 in | Wt 156.0 lb

## 2015-02-28 DIAGNOSIS — J4520 Mild intermittent asthma, uncomplicated: Secondary | ICD-10-CM | POA: Diagnosis not present

## 2015-02-28 DIAGNOSIS — R011 Cardiac murmur, unspecified: Secondary | ICD-10-CM | POA: Insufficient documentation

## 2015-02-28 DIAGNOSIS — E034 Atrophy of thyroid (acquired): Secondary | ICD-10-CM | POA: Diagnosis not present

## 2015-02-28 DIAGNOSIS — E038 Other specified hypothyroidism: Secondary | ICD-10-CM

## 2015-02-28 DIAGNOSIS — I10 Essential (primary) hypertension: Secondary | ICD-10-CM | POA: Diagnosis not present

## 2015-02-28 DIAGNOSIS — Z0000 Encounter for general adult medical examination without abnormal findings: Secondary | ICD-10-CM

## 2015-02-28 DIAGNOSIS — L309 Dermatitis, unspecified: Secondary | ICD-10-CM

## 2015-02-28 LAB — URINALYSIS, ROUTINE W REFLEX MICROSCOPIC
Bilirubin Urine: NEGATIVE
Ketones, ur: NEGATIVE
Nitrite: NEGATIVE
PH: 6.5 (ref 5.0–8.0)
SPECIFIC GRAVITY, URINE: 1.02 (ref 1.000–1.030)
TOTAL PROTEIN, URINE-UPE24: NEGATIVE
URINE GLUCOSE: NEGATIVE
Urobilinogen, UA: 0.2 (ref 0.0–1.0)

## 2015-02-28 LAB — BASIC METABOLIC PANEL
BUN: 16 mg/dL (ref 6–23)
CALCIUM: 9.4 mg/dL (ref 8.4–10.5)
CO2: 29 mEq/L (ref 19–32)
Chloride: 103 mEq/L (ref 96–112)
Creatinine, Ser: 0.87 mg/dL (ref 0.40–1.20)
GFR: 68.49 mL/min (ref >60.00)
GLUCOSE: 91 mg/dL (ref 70–99)
Potassium: 4.2 mEq/L (ref 3.5–5.1)
SODIUM: 139 meq/L (ref 135–145)

## 2015-02-28 LAB — CBC WITH DIFFERENTIAL/PLATELET
BASOS ABS: 0.1 10*3/uL (ref 0.0–0.1)
Basophils Relative: 1.2 % (ref 0.0–3.0)
Eosinophils Absolute: 0.8 10*3/uL — ABNORMAL HIGH (ref 0.0–0.7)
Eosinophils Relative: 10.1 % — ABNORMAL HIGH (ref 0.0–5.0)
HCT: 38.8 % (ref 36.0–46.0)
HEMOGLOBIN: 13 g/dL (ref 12.0–15.0)
Lymphocytes Relative: 30 % (ref 12.0–46.0)
Lymphs Abs: 2.3 10*3/uL (ref 0.7–4.0)
MCHC: 33.4 g/dL (ref 30.0–36.0)
MCV: 91.4 fl (ref 78.0–100.0)
MONO ABS: 0.7 10*3/uL (ref 0.1–1.0)
Monocytes Relative: 8.5 % (ref 3.0–12.0)
NEUTROS PCT: 50.2 % (ref 43.0–77.0)
Neutro Abs: 3.9 10*3/uL (ref 1.4–7.7)
PLATELETS: 329 10*3/uL (ref 150.0–400.0)
RBC: 4.24 Mil/uL (ref 3.87–5.11)
RDW: 13.8 % (ref 11.5–15.5)
WBC: 7.8 10*3/uL (ref 4.0–10.5)

## 2015-02-28 LAB — HEPATIC FUNCTION PANEL
ALBUMIN: 4.2 g/dL (ref 3.5–5.2)
ALK PHOS: 102 U/L (ref 39–117)
ALT: 12 U/L (ref 0–35)
AST: 17 U/L (ref 0–37)
Bilirubin, Direct: 0.1 mg/dL (ref 0.0–0.3)
Total Bilirubin: 0.5 mg/dL (ref 0.2–1.2)
Total Protein: 7.1 g/dL (ref 6.0–8.3)

## 2015-02-28 LAB — LIPID PANEL
CHOLESTEROL: 207 mg/dL — AB (ref 0–200)
HDL: 53.2 mg/dL (ref >39.00)
LDL Cholesterol: 134 mg/dL — ABNORMAL HIGH (ref 0–99)
NonHDL: 153.97
Total CHOL/HDL Ratio: 4
Triglycerides: 99 mg/dL (ref 0.0–149.0)
VLDL: 19.8 mg/dL (ref 0.0–40.0)

## 2015-02-28 LAB — TSH: TSH: 3.49 u[IU]/mL (ref 0.35–4.50)

## 2015-02-28 MED ORDER — TRIAMCINOLONE ACETONIDE 0.5 % EX OINT: 1.0000 "application " | TOPICAL_OINTMENT | Freq: Two times a day (BID) | CUTANEOUS | Status: DC

## 2015-02-28 NOTE — Assessment & Plan Note (Signed)
Chronic On Levothroid - 

## 2015-02-28 NOTE — Assessment & Plan Note (Signed)
Better now. Not using MDIs

## 2015-02-28 NOTE — Assessment & Plan Note (Signed)
Chronic On Verapamil 

## 2015-02-28 NOTE — Progress Notes (Signed)
Pre visit review using our clinic review tool, if applicable. No additional management support is needed unless otherwise documented below in the visit note. 

## 2015-02-28 NOTE — Assessment & Plan Note (Signed)
Kenalog oint Gluten free trial (no wheat products) for 4-6 weeks. OK to use gluten-free bread and gluten-free pasta.

## 2015-02-28 NOTE — Patient Instructions (Addendum)
Gluten free trial (no wheat products) for 4-6 weeks. OK to use gluten-free bread and gluten-free pasta.   Preventive Care for Adults A healthy lifestyle and preventive care can promote health and wellness. Preventive health guidelines for women include the following key practices.  A routine yearly physical is a good way to check with your health care provider about your health and preventive screening. It is a chance to share any concerns and updates on your health and to receive a thorough exam.  Visit your dentist for a routine exam and preventive care every 6 months. Brush your teeth twice a day and floss once a day. Good oral hygiene prevents tooth decay and gum disease.  The frequency of eye exams is based on your age, health, family medical history, use of contact lenses, and other factors. Follow your health care provider's recommendations for frequency of eye exams.  Eat a healthy diet. Foods like vegetables, fruits, whole grains, low-fat dairy products, and lean protein foods contain the nutrients you need without too many calories. Decrease your intake of foods high in solid fats, added sugars, and salt. Eat the right amount of calories for you.Get information about a proper diet from your health care provider, if necessary.  Regular physical exercise is one of the most important things you can do for your health. Most adults should get at least 150 minutes of moderate-intensity exercise (any activity that increases your heart rate and causes you to sweat) each week. In addition, most adults need muscle-strengthening exercises on 2 or more days a week.  Maintain a healthy weight. The body mass index (BMI) is a screening tool to identify possible weight problems. It provides an estimate of body fat based on height and weight. Your health care provider can find your BMI and can help you achieve or maintain a healthy weight.For adults 20 years and older:  A BMI below 18.5 is  considered underweight.  A BMI of 18.5 to 24.9 is normal.  A BMI of 25 to 29.9 is considered overweight.  A BMI of 30 and above is considered obese.  Maintain normal blood lipids and cholesterol levels by exercising and minimizing your intake of saturated fat. Eat a balanced diet with plenty of fruit and vegetables. Blood tests for lipids and cholesterol should begin at age 57 and be repeated every 5 years. If your lipid or cholesterol levels are high, you are over 50, or you are at high risk for heart disease, you may need your cholesterol levels checked more frequently.Ongoing high lipid and cholesterol levels should be treated with medicines if diet and exercise are not working.  If you smoke, find out from your health care provider how to quit. If you do not use tobacco, do not start.  Lung cancer screening is recommended for adults aged 36-80 years who are at high risk for developing lung cancer because of a history of smoking. A yearly low-dose CT scan of the lungs is recommended for people who have at least a 30-pack-year history of smoking and are a current smoker or have quit within the past 15 years. A pack year of smoking is smoking an average of 1 pack of cigarettes a day for 1 year (for example: 1 pack a day for 30 years or 2 packs a day for 15 years). Yearly screening should continue until the smoker has stopped smoking for at least 15 years. Yearly screening should be stopped for people who develop a health problem that would  prevent them from having lung cancer treatment.  If you are pregnant, do not drink alcohol. If you are breastfeeding, be very cautious about drinking alcohol. If you are not pregnant and choose to drink alcohol, do not have more than 1 drink per day. One drink is considered to be 12 ounces (355 mL) of beer, 5 ounces (148 mL) of wine, or 1.5 ounces (44 mL) of liquor.  Avoid use of street drugs. Do not share needles with anyone. Ask for help if you need support or  instructions about stopping the use of drugs.  High blood pressure causes heart disease and increases the risk of stroke. Your blood pressure should be checked at least every 1 to 2 years. Ongoing high blood pressure should be treated with medicines if weight loss and exercise do not work.  If you are 68-64 years old, ask your health care provider if you should take aspirin to prevent strokes.  Diabetes screening involves taking a blood sample to check your fasting blood sugar level. This should be done once every 3 years, after age 22, if you are within normal weight and without risk factors for diabetes. Testing should be considered at a younger age or be carried out more frequently if you are overweight and have at least 1 risk factor for diabetes.  Breast cancer screening is essential preventive care for women. You should practice "breast self-awareness." This means understanding the normal appearance and feel of your breasts and may include breast self-examination. Any changes detected, no matter how small, should be reported to a health care provider. Women in their 17s and 30s should have a clinical breast exam (CBE) by a health care provider as part of a regular health exam every 1 to 3 years. After age 37, women should have a CBE every year. Starting at age 38, women should consider having a mammogram (breast X-ray test) every year. Women who have a family history of breast cancer should talk to their health care provider about genetic screening. Women at a high risk of breast cancer should talk to their health care providers about having an MRI and a mammogram every year.  Breast cancer gene (BRCA)-related cancer risk assessment is recommended for women who have family members with BRCA-related cancers. BRCA-related cancers include breast, ovarian, tubal, and peritoneal cancers. Having family members with these cancers may be associated with an increased risk for harmful changes (mutations) in  the breast cancer genes BRCA1 and BRCA2. Results of the assessment will determine the need for genetic counseling and BRCA1 and BRCA2 testing.  Routine pelvic exams to screen for cancer are no longer recommended for nonpregnant women who are considered low risk for cancer of the pelvic organs (ovaries, uterus, and vagina) and who do not have symptoms. Ask your health care provider if a screening pelvic exam is right for you.  If you have had past treatment for cervical cancer or a condition that could lead to cancer, you need Pap tests and screening for cancer for at least 20 years after your treatment. If Pap tests have been discontinued, your risk factors (such as having a new sexual partner) need to be reassessed to determine if screening should be resumed. Some women have medical problems that increase the chance of getting cervical cancer. In these cases, your health care provider may recommend more frequent screening and Pap tests.  The HPV test is an additional test that may be used for cervical cancer screening. The HPV test looks for  the virus that can cause the cell changes on the cervix. The cells collected during the Pap test can be tested for HPV. The HPV test could be used to screen women aged 47 years and older, and should be used in women of any age who have unclear Pap test results. After the age of 74, women should have HPV testing at the same frequency as a Pap test.  Colorectal cancer can be detected and often prevented. Most routine colorectal cancer screening begins at the age of 65 years and continues through age 9 years. However, your health care provider may recommend screening at an earlier age if you have risk factors for colon cancer. On a yearly basis, your health care provider may provide home test kits to check for hidden blood in the stool. Use of a small camera at the end of a tube, to directly examine the colon (sigmoidoscopy or colonoscopy), can detect the earliest forms  of colorectal cancer. Talk to your health care provider about this at age 40, when routine screening begins. Direct exam of the colon should be repeated every 5-10 years through age 54 years, unless early forms of pre-cancerous polyps or small growths are found.  People who are at an increased risk for hepatitis B should be screened for this virus. You are considered at high risk for hepatitis B if:  You were born in a country where hepatitis B occurs often. Talk with your health care provider about which countries are considered high risk.  Your parents were born in a high-risk country and you have not received a shot to protect against hepatitis B (hepatitis B vaccine).  You have HIV or AIDS.  You use needles to inject street drugs.  You live with, or have sex with, someone who has hepatitis B.  You get hemodialysis treatment.  You take certain medicines for conditions like cancer, organ transplantation, and autoimmune conditions.  Hepatitis C blood testing is recommended for all people born from [birth year] through 1965 and any individual with known risks for hepatitis C.  Practice safe sex. Use condoms and avoid high-risk sexual practices to reduce the spread of sexually transmitted infections (STIs). STIs include gonorrhea, chlamydia, syphilis, trichomonas, herpes, HPV, and human immunodeficiency virus (HIV). Herpes, HIV, and HPV are viral illnesses that have no cure. They can result in disability, cancer, and death.  You should be screened for sexually transmitted illnesses (STIs) including gonorrhea and chlamydia if:  You are sexually active and are younger than 24 years.  You are older than 24 years and your health care provider tells you that you are at risk for this type of infection.  Your sexual activity has changed since you were last screened and you are at an increased risk for chlamydia or gonorrhea. Ask your health care provider if you are at risk.  If you are at risk of  being infected with HIV, it is recommended that you take a prescription medicine daily to prevent HIV infection. This is called preexposure prophylaxis (PrEP). You are considered at risk if:  You are a heterosexual woman, are sexually active, and are at increased risk for HIV infection.  You take drugs by injection.  You are sexually active with a partner who has HIV.  Talk with your health care provider about whether you are at high risk of being infected with HIV. If you choose to begin PrEP, you should first be tested for HIV. You should then be tested every 3 months for  as long as you are taking PrEP.  Osteoporosis is a disease in which the bones lose minerals and strength with aging. This can result in serious bone fractures or breaks. The risk of osteoporosis can be identified using a bone density scan. Women ages 65 years and over and women at risk for fractures or osteoporosis should discuss screening with their health care providers. Ask your health care provider whether you should take a calcium supplement or vitamin D to reduce the rate of osteoporosis.  Menopause can be associated with physical symptoms and risks. Hormone replacement therapy is available to decrease symptoms and risks. You should talk to your health care provider about whether hormone replacement therapy is right for you.  Use sunscreen. Apply sunscreen liberally and repeatedly throughout the day. You should seek shade when your shadow is shorter than you. Protect yourself by wearing long sleeves, pants, a wide-brimmed hat, and sunglasses year round, whenever you are outdoors.  Once a month, do a whole body skin exam, using a mirror to look at the skin on your back. Tell your health care provider of new moles, moles that have irregular borders, moles that are larger than a pencil eraser, or moles that have changed in shape or color.  Stay current with required vaccines (immunizations).  Influenza vaccine. All adults  should be immunized every year.  Tetanus, diphtheria, and acellular pertussis (Td, Tdap) vaccine. Pregnant women should receive 1 dose of Tdap vaccine during each pregnancy. The dose should be obtained regardless of the length of time since the last dose. Immunization is preferred during the 27th-36th week of gestation. An adult who has not previously received Tdap or who does not know her vaccine status should receive 1 dose of Tdap. This initial dose should be followed by tetanus and diphtheria toxoids (Td) booster doses every 10 years. Adults with an unknown or incomplete history of completing a 3-dose immunization series with Td-containing vaccines should begin or complete a primary immunization series including a Tdap dose. Adults should receive a Td booster every 10 years.  Varicella vaccine. An adult without evidence of immunity to varicella should receive 2 doses or a second dose if she has previously received 1 dose. Pregnant females who do not have evidence of immunity should receive the first dose after pregnancy. This first dose should be obtained before leaving the health care facility. The second dose should be obtained 4-8 weeks after the first dose.  Human papillomavirus (HPV) vaccine. Females aged 13-26 years who have not received the vaccine previously should obtain the 3-dose series. The vaccine is not recommended for use in pregnant females. However, pregnancy testing is not needed before receiving a dose. If a female is found to be pregnant after receiving a dose, no treatment is needed. In that case, the remaining doses should be delayed until after the pregnancy. Immunization is recommended for any person with an immunocompromised condition through the age of 5 years if she did not get any or all doses earlier. During the 3-dose series, the second dose should be obtained 4-8 weeks after the first dose. The third dose should be obtained 24 weeks after the first dose and 16 weeks after  the second dose.  Zoster vaccine. One dose is recommended for adults aged 48 years or older unless certain conditions are present.  Measles, mumps, and rubella (MMR) vaccine. Adults born before [birth year] generally are considered immune to measles and mumps. Adults born in [birth year] or later should have 1 or more  doses of MMR vaccine unless there is a contraindication to the vaccine or there is laboratory evidence of immunity to each of the three diseases. A routine second dose of MMR vaccine should be obtained at least 28 days after the first dose for students attending postsecondary schools, health care workers, or international travelers. People who received inactivated measles vaccine or an unknown type of measles vaccine during 1963-1967 should receive 2 doses of MMR vaccine. People who received inactivated mumps vaccine or an unknown type of mumps vaccine before 1979 and are at high risk for mumps infection should consider immunization with 2 doses of MMR vaccine. For females of childbearing age, rubella immunity should be determined. If there is no evidence of immunity, females who are not pregnant should be vaccinated. If there is no evidence of immunity, females who are pregnant should delay immunization until after pregnancy. Unvaccinated health care workers born before [birth year] who lack laboratory evidence of measles, mumps, or rubella immunity or laboratory confirmation of disease should consider measles and mumps immunization with 2 doses of MMR vaccine or rubella immunization with 1 dose of MMR vaccine.  Pneumococcal 13-valent conjugate (PCV13) vaccine. When indicated, a person who is uncertain of her immunization history and has no record of immunization should receive the PCV13 vaccine. An adult aged 55 years or older who has certain medical conditions and has not been previously immunized should receive 1 dose of PCV13 vaccine. This PCV13 should be followed with a dose of pneumococcal polysaccharide (PPSV23)  vaccine. The PPSV23 vaccine dose should be obtained at least 8 weeks after the dose of PCV13 vaccine. An adult aged 47 years or older who has certain medical conditions and previously received 1 or more doses of PPSV23 vaccine should receive 1 dose of PCV13. The PCV13 vaccine dose should be obtained 1 or more years after the last PPSV23 vaccine dose.  Pneumococcal polysaccharide (PPSV23) vaccine. When PCV13 is also indicated, PCV13 should be obtained first. All adults aged 7 years and older should be immunized. An adult younger than age 26 years who has certain medical conditions should be immunized. Any person who resides in a nursing home or long-term care facility should be immunized. An adult smoker should be immunized. People with an immunocompromised condition and certain other conditions should receive both PCV13 and PPSV23 vaccines. People with human immunodeficiency virus (HIV) infection should be immunized as soon as possible after diagnosis. Immunization during chemotherapy or radiation therapy should be avoided. Routine use of PPSV23 vaccine is not recommended for American Indians, [place] Natives, or people younger than 65 years unless there are medical conditions that require PPSV23 vaccine. When indicated, people who have unknown immunization and have no record of immunization should receive PPSV23 vaccine. One-time revaccination 5 years after the first dose of PPSV23 is recommended for people aged 19-64 years who have chronic kidney failure, nephrotic syndrome, asplenia, or immunocompromised conditions. People who received 1-2 doses of PPSV23 before age 21 years should receive another dose of PPSV23 vaccine at age 36 years or later if at least 5 years have passed since the previous dose. Doses of PPSV23 are not needed for people immunized with PPSV23 at or after age 9 years.  Meningococcal vaccine. Adults with asplenia or persistent complement component deficiencies should receive 2 doses of  quadrivalent meningococcal conjugate (MenACWY-D) vaccine. The doses should be obtained at least 2 months apart. Microbiologists working with certain meningococcal bacteria, [place] recruits, people at risk during an outbreak, and people who travel to  or live in countries with a high rate of meningitis should be immunized. A first-year college student up through age 67 years who is living in a residence hall should receive a dose if she did not receive a dose on or after her 16th birthday. Adults who have certain high-risk conditions should receive one or more doses of vaccine.  Hepatitis A vaccine. Adults who wish to be protected from this disease, have certain high-risk conditions, work with hepatitis A-infected animals, work in hepatitis A research labs, or travel to or work in countries with a high rate of hepatitis A should be immunized. Adults who were previously unvaccinated and who anticipate close contact with an international adoptee during the first 60 days after arrival in the [place] from a country with a high rate of hepatitis A should be immunized.  Hepatitis B vaccine. Adults who wish to be protected from this disease, have certain high-risk conditions, may be exposed to blood or other infectious body fluids, are household contacts or sex partners of hepatitis B positive people, are clients or workers in certain care facilities, or travel to or work in countries with a high rate of hepatitis B should be immunized.  Haemophilus influenzae type b (Hib) vaccine. A previously unvaccinated person with asplenia or sickle cell disease or having a scheduled splenectomy should receive 1 dose of Hib vaccine. Regardless of previous immunization, a recipient of a hematopoietic stem cell transplant should receive a 3-dose series 6-12 months after her successful transplant. Hib vaccine is not recommended for adults with HIV infection. Preventive Services / Frequency Ages 21 to 93 years  Blood  pressure check.** / Every 1 to 2 years.  Lipid and cholesterol check.** / Every 5 years beginning at age 72.  Clinical breast exam.** / Every 3 years for women in their 13s and 83s.  BRCA-related cancer risk assessment.** / For women who have family members with a BRCA-related cancer (breast, ovarian, tubal, or peritoneal cancers).  Pap test.** / Every 2 years from ages 42 through 64. Every 3 years starting at age 61 through age 96 or 50 with a history of 3 consecutive normal Pap tests.  HPV screening.** / Every 3 years from ages 28 through ages 9 to 70 with a history of 3 consecutive normal Pap tests.  Hepatitis C blood test.** / For any individual with known risks for hepatitis C.  Skin self-exam. / Monthly.  Influenza vaccine. / Every year.  Tetanus, diphtheria, and acellular pertussis (Tdap, Td) vaccine.** / Consult your health care provider. Pregnant women should receive 1 dose of Tdap vaccine during each pregnancy. 1 dose of Td every 10 years.  Varicella vaccine.** / Consult your health care provider. Pregnant females who do not have evidence of immunity should receive the first dose after pregnancy.  HPV vaccine. / 3 doses over 6 months, if 10 and younger. The vaccine is not recommended for use in pregnant females. However, pregnancy testing is not needed before receiving a dose.  Measles, mumps, rubella (MMR) vaccine.** / You need at least 1 dose of MMR if you were born in 1957 or later. You may also need a 2nd dose. For females of childbearing age, rubella immunity should be determined. If there is no evidence of immunity, females who are not pregnant should be vaccinated. If there is no evidence of immunity, females who are pregnant should delay immunization until after pregnancy.  Pneumococcal 13-valent conjugate (PCV13) vaccine.** / Consult your health care provider.  Pneumococcal polysaccharide (  PPSV23) vaccine.** / 1 to 2 doses if you smoke cigarettes or if you have certain  conditions.  Meningococcal vaccine.** / 1 dose if you are age 27 to 19 years and a Market researcher living in a residence hall, or have one of several medical conditions, you need to get vaccinated against meningococcal disease. You may also need additional booster doses.  Hepatitis A vaccine.** / Consult your health care provider.  Hepatitis B vaccine.** / Consult your health care provider.  Haemophilus influenzae type b (Hib) vaccine.** / Consult your health care provider. Ages 75 to 79 years  Blood pressure check.** / Every 1 to 2 years.  Lipid and cholesterol check.** / Every 5 years beginning at age 29 years.  Lung cancer screening. / Every year if you are aged 36-80 years and have a 30-pack-year history of smoking and currently smoke or have quit within the past 15 years. Yearly screening is stopped once you have quit smoking for at least 15 years or develop a health problem that would prevent you from having lung cancer treatment.  Clinical breast exam.** / Every year after age 55 years.  BRCA-related cancer risk assessment.** / For women who have family members with a BRCA-related cancer (breast, ovarian, tubal, or peritoneal cancers).  Mammogram.** / Every year beginning at age 26 years and continuing for as long as you are in good health. Consult with your health care provider.  Pap test.** / Every 3 years starting at age 5 years through age 69 or 40 years with a history of 3 consecutive normal Pap tests.  HPV screening.** / Every 3 years from ages 33 years through ages 66 to 61 years with a history of 3 consecutive normal Pap tests.  Fecal occult blood test (FOBT) of stool. / Every year beginning at age 34 years and continuing until age 59 years. You may not need to do this test if you get a colonoscopy every 10 years.  Flexible sigmoidoscopy or colonoscopy.** / Every 5 years for a flexible sigmoidoscopy or every 10 years for a colonoscopy beginning at age 50 years  and continuing until age 24 years.  Hepatitis C blood test.** / For all people born from [birth year] through 1966 and any individual with known risks for hepatitis C.  Skin self-exam. / Monthly.  Influenza vaccine. / Every year.  Tetanus, diphtheria, and acellular pertussis (Tdap/Td) vaccine.** / Consult your health care provider. Pregnant women should receive 1 dose of Tdap vaccine during each pregnancy. 1 dose of Td every 10 years.  Varicella vaccine.** / Consult your health care provider. Pregnant females who do not have evidence of immunity should receive the first dose after pregnancy.  Zoster vaccine.** / 1 dose for adults aged 39 years or older.  Measles, mumps, rubella (MMR) vaccine.** / You need at least 1 dose of MMR if you were born in 1957 or later. You may also need a 2nd dose. For females of childbearing age, rubella immunity should be determined. If there is no evidence of immunity, females who are not pregnant should be vaccinated. If there is no evidence of immunity, females who are pregnant should delay immunization until after pregnancy.  Pneumococcal 13-valent conjugate (PCV13) vaccine.** / Consult your health care provider.  Pneumococcal polysaccharide (PPSV23) vaccine.** / 1 to 2 doses if you smoke cigarettes or if you have certain conditions.  Meningococcal vaccine.** / Consult your health care provider.  Hepatitis A vaccine.** / Consult your health care provider.  Hepatitis B vaccine.** /  Consult your health care provider.  Haemophilus influenzae type b (Hib) vaccine.** / Consult your health care provider. Ages 91 years and over  Blood pressure check.** / Every 1 to 2 years.  Lipid and cholesterol check.** / Every 5 years beginning at age 15 years.  Lung cancer screening. / Every year if you are aged 82-80 years and have a 30-pack-year history of smoking and currently smoke or have quit within the past 15 years. Yearly screening is stopped once you have quit smoking  for at least 15 years or develop a health problem that would prevent you from having lung cancer treatment.  Clinical breast exam.** / Every year after age 36 years.  BRCA-related cancer risk assessment.** / For women who have family members with a BRCA-related cancer (breast, ovarian, tubal, or peritoneal cancers).  Mammogram.** / Every year beginning at age 69 years and continuing for as long as you are in good health. Consult with your health care provider.  Pap test.** / Every 3 years starting at age 58 years through age 66 or 38 years with 3 consecutive normal Pap tests. Testing can be stopped between 65 and 70 years with 3 consecutive normal Pap tests and no abnormal Pap or HPV tests in the past 10 years.  HPV screening.** / Every 3 years from ages 68 years through ages 64 or 32 years with a history of 3 consecutive normal Pap tests. Testing can be stopped between 65 and 70 years with 3 consecutive normal Pap tests and no abnormal Pap or HPV tests in the past 10 years.  Fecal occult blood test (FOBT) of stool. / Every year beginning at age 48 years and continuing until age 63 years. You may not need to do this test if you get a colonoscopy every 10 years.  Flexible sigmoidoscopy or colonoscopy.** / Every 5 years for a flexible sigmoidoscopy or every 10 years for a colonoscopy beginning at age 34 years and continuing until age 94 years.  Hepatitis C blood test.** / For all people born from [birth year] through 1966 and any individual with known risks for hepatitis C.  Osteoporosis screening.** / A one-time screening for women ages 10 years and over and women at risk for fractures or osteoporosis.  Skin self-exam. / Monthly.  Influenza vaccine. / Every year.  Tetanus, diphtheria, and acellular pertussis (Tdap/Td) vaccine.** / 1 dose of Td every 10 years.  Varicella vaccine.** / Consult your health care provider.  Zoster vaccine.** / 1 dose for adults aged 31 years or older.  Pneumococcal  13-valent conjugate (PCV13) vaccine.** / Consult your health care provider.  Pneumococcal polysaccharide (PPSV23) vaccine.** / 1 dose for all adults aged 34 years and older.  Meningococcal vaccine.** / Consult your health care provider.  Hepatitis A vaccine.** / Consult your health care provider.  Hepatitis B vaccine.** / Consult your health care provider.  Haemophilus influenzae type b (Hib) vaccine.** / Consult your health care provider. ** Family history and personal history of risk and conditions may change your health care provider's recommendations. Document Released: 08/31/2001 Document Revised: 11/19/2013 Document Reviewed: 11/30/2010 [facility] Patient Information 2015 [place], [place]. This information is not intended to replace advice given to you by your health care provider. Make sure you discuss any questions you have with your health care provider.

## 2015-02-28 NOTE — Assessment & Plan Note (Addendum)
Here for medicare wellness/physical  Diet: heart healthy  Physical activity: not sedentary  Depression/mood screen: negative  Hearing: intact to whispered voice  Visual acuity: grossly normal, performs annual eye exam  ADLs: capable  Fall risk: none  Home safety: good  Cognitive evaluation: intact to orientation, naming, recall and repetition  EOL planning: adv directives, full code/ I agree  I have personally reviewed and have noted  1. The patient's medical, surgical and social history  2. Their use of alcohol, tobacco or illicit drugs  3. Their current medications and supplements  4. The patient's functional ability including ADL's, fall risks, home safety risks and hearing or visual impairment.  5. Diet and physical activities  6. Evidence for depression or mood disorders 7. The roster of all physicians providing medical care to patient - is listed in the Snapshot section of the chart and reviewed today.    Today patient counseled on age appropriate routine health concerns for screening and prevention, each reviewed and up to date or declined. Immunizations reviewed and up to date or declined. Labs ordered and reviewed. Risk factors for depression reviewed and negative. Hearing function and visual acuity are intact. ADLs screened and addressed as needed. Functional ability and level of safety reviewed and appropriate. Education, counseling and referrals performed based on assessed risks today. Patient provided with a copy of personalized plan for preventive services.    Cologuard

## 2015-02-28 NOTE — Assessment & Plan Note (Signed)
ECHO

## 2015-02-28 NOTE — Progress Notes (Signed)
Subjective:  Patient ID: Cindy Simpson, female    DOB: 05/21/1946  Age: 69 y.o. MRN: [id number]  CC: No chief complaint on file.   HPI MISTEY FIMBRES presents for a well exam. F/u hypothyroidism, dyslipidemia and HTN. C/o rash on hands x 6 mo  Outpatient Prescriptions Prior to Visit  Medication Sig Dispense Refill  . aspirin 325 MG tablet Take 325 mg by mouth daily.      . calcium-vitamin D (OSCAL WITH D) 500-200 MG-UNIT per tablet Take 1 tablet by mouth daily with breakfast.    . Cholecalciferol 1000 UNITS tablet Take 2,000 Units by mouth daily.    . fish oil-omega-3 fatty acids 1000 MG capsule Take 1 g by mouth daily.    Marland Kitchen levothyroxine (SYNTHROID, LEVOTHROID) 75 MCG tablet Take 1 tablet by mouth  daily 90 tablet 0  . loratadine (CLARITIN) 10 MG tablet Take 10 mg by mouth daily.    Marland Kitchen lovastatin (MEVACOR) 10 MG tablet Take 1 tablet by mouth  daily 90 tablet 3  . omeprazole (PRILOSEC) 40 MG capsule Take 1 capsule by mouth  twice daily 180 capsule 3  . verapamil (CALAN-SR) 240 MG CR tablet Take 1 tablet by mouth at  bedtime 90 tablet 0   No facility-administered medications prior to visit.    ROS Review of Systems  Constitutional: Negative for chills, diaphoresis, activity change, appetite change, fatigue and unexpected weight change.  HENT: Negative for congestion, mouth sores and sinus pressure.   Eyes: Negative for visual disturbance.  Respiratory: Negative for cough and chest tightness.   Cardiovascular: Negative for chest pain and palpitations.  Gastrointestinal: Negative for nausea, vomiting, abdominal pain and abdominal distention.  Genitourinary: Negative for frequency, difficulty urinating and vaginal pain.  Musculoskeletal: Negative for back pain and gait problem.  Skin: Negative for pallor and rash.  Neurological: Negative for dizziness, tremors, weakness, numbness and headaches.  Psychiatric/Behavioral: Negative for suicidal ideas, confusion and sleep disturbance.     Objective:  BP 112/82 mmHg  Pulse 81  Ht 5\' 3"  (1.6 m)  Wt 156 lb (70.761 kg)  BMI 27.64 kg/m2  SpO2 95%  BP Readings from Last 3 Encounters:  02/28/15 112/82  10/29/14 124/80  08/30/14 120/100    Wt Readings from Last 3 Encounters:  02/28/15 156 lb (70.761 kg)  10/29/14 158 lb (71.668 kg)  08/30/14 161 lb 12 oz (73.369 kg)    Physical Exam  Constitutional: She appears well-developed. No distress.  Obese  HENT:  Head: Normocephalic.  Right Ear: External ear normal.  Left Ear: External ear normal.  Nose: Nose normal.  Mouth/Throat: Oropharynx is clear and moist.  Eyes: Conjunctivae are normal. Pupils are equal, round, and reactive to light. Right eye exhibits no discharge. Left eye exhibits no discharge.  Neck: Normal range of motion. Neck supple. No JVD present. No tracheal deviation present. No thyromegaly present.  Cardiovascular: Normal rate and regular rhythm.   Murmur (2/6) heard. Pulmonary/Chest: No stridor. No respiratory distress. She has no wheezes.  Abdominal: Soft. Bowel sounds are normal. She exhibits no distension and no mass. There is no tenderness. There is no rebound and no guarding.  Musculoskeletal: She exhibits no edema or tenderness.  Lymphadenopathy:    She has no cervical adenopathy.  Neurological: She displays normal reflexes. No cranial nerve deficit. She exhibits normal muscle tone. Coordination normal.  Skin: No rash noted. No erythema.  Psychiatric: She has a normal mood and affect. Her behavior is normal. Judgment and  thought content normal.  eczema patches on palms b  Lab Results  Component Value Date   WBC 7.7 02/18/2014   HGB 13.1 02/18/2014   HCT 39.7 02/18/2014   PLT 337.0 02/18/2014   GLUCOSE 84 02/18/2014   CHOL 219* 02/18/2014   TRIG 110.0 02/18/2014   HDL 58.50 02/18/2014   LDLDIRECT 137.5 03/17/2009   LDLCALC 139* 02/18/2014   ALT 17 02/18/2014   AST 24 02/18/2014   NA 135 02/18/2014   K 4.3 02/18/2014   CL 99  02/18/2014   CREATININE 0.8 02/18/2014   BUN 21 02/18/2014   CO2 23 02/18/2014   TSH 4.28 02/18/2014    Dg Bone Density  12/23/2014   Date of study: 12/20/14 Exam: DUAL X-RAY ABSORPTIOMETRY (DXA) FOR BONE MINERAL DENSITY (BMD) Instrument: [employer] Therapist, art Provider: PCP Indication: follow up for low BMD Comparison: none (please note that it is not possible to compare data from  different instruments) Clinical data: Pt is a postmenopausal 69 y.o. female with no  previous h/o  fracture. On calcium and vitamin D.  Results:  Lumbar spine (L1-L4) Femoral neck (FN) 33% distal radius  T-score -1.6 RFN:-1.0 LFN:-1.2 n/a  Change in BMD from previous DXA test (%) n/a n/a n/a  (*) statistically significant  Assessment: the BMD is low according to the [facility] classification for  osteoporosis (see below). Fracture risk: moderate FRAX score: 10 year major osteoporotic risk: 9.0%. 10 year hip fracture  risk: 1.0%. These are under the thresholds for treatment of 20% and 3%,  respectively. Comments: the technical quality of the study is good. Evaluation for secondary causes should be considered if clinically  indicated.  Recommend optimizing calcium (1200 mg/day) and vitamin D (800 IU/day)  intake.  Followup: Repeat BMD is appropriate after 2 years or after 1-2 years if  starting treatment.  WHO criteria for diagnosis of osteoporosis in postmenopausal women and in  men 85 y/o or older:  - normal: T-score -1.0 to + 1.0 - osteopenia/low bone density: T-score between -2.5 and -1.0 - osteoporosis: T-score below -2.5 - severe osteoporosis: T-score below -2.5 with history of fragility  fracture Note: although not part of the WHO classification, the presence of a  fragility fracture, regardless of the T-score, should be considered  diagnostic of osteoporosis, provided other causes for the fracture have  been excluded.  Treatment: The National Osteoporosis Foundation recommends that treatment  be considered in postmenopausal  women and men age 57 or older with: 1. Hip or vertebral (clinical or morphometric) fracture 2. T-score of - 2.5 or lower at the spine or hip 3. 10-year fracture probability by FRAX of at least 20% for a major  osteoporotic fracture and 3% for a hip fracture  Roxy Manns MD       Assessment & Plan:   There are no diagnoses linked to this encounter. I am having Ms. Haberle maintain her aspirin, fish oil-omega-3 fatty acids, Cholecalciferol, loratadine, calcium-vitamin D, levothyroxine, verapamil, lovastatin, and omeprazole.  No orders of the defined types were placed in this encounter.     Follow-up: No Follow-up on file.  [name], MD

## 2015-03-04 DIAGNOSIS — Z961 Presence of intraocular lens: Secondary | ICD-10-CM | POA: Diagnosis not present

## 2015-03-04 DIAGNOSIS — H2512 Age-related nuclear cataract, left eye: Secondary | ICD-10-CM | POA: Diagnosis not present

## 2015-03-04 DIAGNOSIS — H02839 Dermatochalasis of unspecified eye, unspecified eyelid: Secondary | ICD-10-CM | POA: Diagnosis not present

## 2015-03-07 ENCOUNTER — Other Ambulatory Visit: Payer: Self-pay

## 2015-03-07 ENCOUNTER — Ambulatory Visit (HOSPITAL_COMMUNITY): Payer: Medicare Other | Attending: Cardiology

## 2015-03-07 DIAGNOSIS — R011 Cardiac murmur, unspecified: Secondary | ICD-10-CM | POA: Diagnosis not present

## 2015-03-07 DIAGNOSIS — I340 Nonrheumatic mitral (valve) insufficiency: Secondary | ICD-10-CM | POA: Diagnosis not present

## 2015-03-07 DIAGNOSIS — I059 Rheumatic mitral valve disease, unspecified: Secondary | ICD-10-CM | POA: Insufficient documentation

## 2015-03-07 DIAGNOSIS — I10 Essential (primary) hypertension: Secondary | ICD-10-CM | POA: Diagnosis not present

## 2015-03-07 DIAGNOSIS — I351 Nonrheumatic aortic (valve) insufficiency: Secondary | ICD-10-CM | POA: Insufficient documentation

## 2015-03-09 DIAGNOSIS — Z1212 Encounter for screening for malignant neoplasm of rectum: Secondary | ICD-10-CM | POA: Diagnosis not present

## 2015-03-09 DIAGNOSIS — Z1211 Encounter for screening for malignant neoplasm of colon: Secondary | ICD-10-CM | POA: Diagnosis not present

## 2015-03-09 LAB — COLOGUARD: Cologuard: NEGATIVE

## 2015-03-11 ENCOUNTER — Other Ambulatory Visit: Payer: Self-pay | Admitting: Internal Medicine

## 2015-03-21 DIAGNOSIS — H25812 Combined forms of age-related cataract, left eye: Secondary | ICD-10-CM | POA: Diagnosis not present

## 2015-03-21 DIAGNOSIS — H2512 Age-related nuclear cataract, left eye: Secondary | ICD-10-CM | POA: Diagnosis not present

## 2015-06-10 ENCOUNTER — Ambulatory Visit (INDEPENDENT_AMBULATORY_CARE_PROVIDER_SITE_OTHER): Payer: Medicare Other

## 2015-06-10 ENCOUNTER — Ambulatory Visit (INDEPENDENT_AMBULATORY_CARE_PROVIDER_SITE_OTHER): Payer: Medicare Other | Admitting: Family Medicine

## 2015-06-10 VITALS — BP 106/80 | HR 78 | Temp 97.8°F | Resp 16 | Ht 63.0 in | Wt 160.2 lb

## 2015-06-10 DIAGNOSIS — M7061 Trochanteric bursitis, right hip: Secondary | ICD-10-CM

## 2015-06-10 DIAGNOSIS — M25551 Pain in right hip: Secondary | ICD-10-CM | POA: Diagnosis not present

## 2015-06-10 NOTE — Patient Instructions (Signed)
Hip Bursitis Bursitis is a swelling and soreness (inflammation) of a fluid-filled sac (bursa). This sac overlies and protects the joints.  CAUSES   Injury.  Overuse of the muscles surrounding the joint.  Arthritis.  Gout.  Infection.  Cold weather.  Inadequate warm-up and conditioning prior to activities. The cause may not be known.  SYMPTOMS   Mild to severe irritation.  Tenderness and swelling over the outside of the hip.  Pain with motion of the hip.  If the bursa becomes infected, a fever may be present. Redness, tenderness, and warmth will develop over the hip. Symptoms usually lessen in 3 to 4 weeks with treatment, but can come back. TREATMENT If conservative treatment does not work, your caregiver may advise draining the bursa and injecting cortisone into the area. This may speed up the healing process. This may also be used as an initial treatment of choice. HOME CARE INSTRUCTIONS   Apply ice to the affected area for 15-20 minutes every 3 to 4 hours while awake for the first 2 days. Put the ice in a plastic bag and place a towel between the bag of ice and your skin.  Rest the painful joint as much as possible, but continue to put the joint through a normal range of motion at least 4 times per day. When the pain lessens, begin normal, slow movements and usual activities to help prevent stiffness of the hip.  Only take over-the-counter or prescription medicines for pain, discomfort, or fever as directed by your caregiver.  Use crutches to limit weight bearing on the hip joint, if advised.  Elevate your painful hip to reduce swelling. Use pillows for propping and cushioning your legs and hips.  Gentle massage may provide comfort and decrease swelling. SEEK IMMEDIATE MEDICAL CARE IF:   Your pain increases even during treatment, or you are not improving.  You have a fever.  You have heat and inflammation over the involved bursa.  You have any other questions or  concerns. MAKE SURE YOU:   Understand these instructions.  Will watch your condition.  Will get help right away if you are not doing well or get worse.   This information is not intended to replace advice given to you by your health care provider. Make sure you discuss any questions you have with your health care provider.   Document Released: 12/25/2001 Document Revised: 09/27/2011 Document Reviewed: 02/04/2015 Elsevier Interactive Patient Education 2016 [employer]  

## 2015-06-10 NOTE — Progress Notes (Signed)
This chart was scribed for Cindy Sidle, MD by Watt Climes Rifaie medical scribe at [facility].The patient was seen in exam room 12 and the patient's care was started at 10:54 AM.  Patient ID: Cindy Simpson MRN: [id number], DOB: 1946-05-03, 69 y.o. Date of Encounter: 06/10/2015  Primary Physician: [name], MD  Chief Complaint:  Chief Complaint  Patient presents with  . Hip Pain    right hip on going for x 2 weeks, shooting down leg    HPI:  Cindy Simpson is a 68 y.o. female who presents to [facility] complaining of a constant right hip pain, onset 2 weeks ago.  Pt reports that the pain is mainly localized in the hip area, however it is radiating down to the knee area of her leg. She notes that walking and putting pressure on the area exacerbate the pain. She indicates that that area is still painful when she is in a supine position or laying on her right side.  She notes that the pain is less severe today. Pt reports a fall on her left side 2 months ago, however she denies any recent traumatic injuries to the area.    Past Medical History  Diagnosis Date  . GERD (gastroesophageal reflux disease)   . Hyperlipidemia   . Hypothyroidism   . Allergic rhinitis   . IBS (irritable bowel syndrome)   . Insomnia   . Asthmatic bronchitis 2009    allergies; post nasal drip but not allergies  . Torticollis 2006    Estimate date; rec'd botox and resolved  . Irregular heart beat Not sure; long time ago    Periodic irregular heart rate; take verapamil.     Home Meds: Prior to Admission medications   Medication Sig Start Date End Date Taking? Authorizing Provider  aspirin 325 MG tablet Take 325 mg by mouth daily.     Yes Historical Provider, MD  Cholecalciferol 1000 UNITS tablet Take 2,000 Units by mouth daily. 10/13/10  Yes Aleksei [name] V, MD  fish oil-omega-3 fatty acids 1000 MG capsule Take 1 g by mouth daily.   Yes Historical  Provider, MD  levothyroxine (SYNTHROID, LEVOTHROID) 75 MCG tablet Take 1 tablet by mouth  daily 03/11/15  Yes Aleksei [name] V, MD  loratadine (CLARITIN) 10 MG tablet Take 10 mg by mouth daily.   Yes Historical Provider, MD  lovastatin (MEVACOR) 10 MG tablet Take 1 tablet by mouth  daily 12/23/14  Yes Aleksei [name] V, MD  omeprazole (PRILOSEC) 40 MG capsule Take 1 capsule by mouth  twice daily 12/23/14  Yes Aleksei [name] V, MD  triamcinolone ointment (KENALOG) 0.5 % Apply 1 application topically 2 (two) times daily. 02/28/15  Yes Aleksei [name] V, MD  verapamil (CALAN-SR) 240 MG CR tablet Take 1 tablet by mouth at  bedtime 03/11/15  Yes Aleksei [name] V, MD  calcium-vitamin D (OSCAL WITH D) 500-200 MG-UNIT per tablet Take 1 tablet by mouth daily with breakfast.    Historical Provider, MD    Allergies:  Allergies  Allergen Reactions  . Metoclopramide Hcl     Social History   Social History  . Marital Status: Married    Spouse Name: N/A  . Number of Children: N/A  . Years of Education: N/A   Occupational History  . Retired    Social History Main Topics  . Smoking status: Never Smoker   . Smokeless tobacco: Never Used  . Alcohol Use: 0.6 -  1.2 oz/week    1-2 Shots of liquor per week     Comment: Perhaps every couple of weeks; not very frequently  . Drug Use: No  . Sexual Activity: No   Other Topics Concern  . Not on file   Social History Narrative     Review of Systems: Constitutional: negative for chills, fever, night sweats, weight changes, or fatigue  HEENT: negative for vision changes, hearing loss, congestion, rhinorrhea, ST, epistaxis, or sinus pressure Cardiovascular: negative for chest pain or palpitations Respiratory: negative for hemoptysis, wheezing, shortness of breath, or cough Abdominal: negative for abdominal pain, nausea, vomiting, diarrhea, or constipation Dermatological: negative for rash Neurologic: negative for headache, dizziness, or  syncope. Msk: positive for arthralgia, myalgia.  All other systems reviewed and are otherwise negative with the exception to those above and in the HPI.  Physical Exam: Blood pressure 106/80, pulse 78, temperature 97.8 F (36.6 C), temperature source Oral, resp. rate 16, height 5\' 3"  (1.6 m), weight 160 lb 3.2 oz (72.666 kg), SpO2 97 %., Body mass index is 28.39 kg/(m^2). General: Well developed, well nourished, in no acute distress. Head: Normocephalic, atraumatic, eyes without discharge, sclera non-icteric, nares are without discharge. Bilateral auditory canals clear, TM's are without perforation, pearly grey and translucent with reflective cone of light bilaterally. Oral cavity moist, posterior pharynx without exudate, erythema, peritonsillar abscess, or post nasal drip.  Neck: Supple. No thyromegaly. Full ROM. No lymphadenopathy. Lungs: Clear bilaterally to auscultation without wheezes, rales, or rhonchi. Breathing is unlabored. Heart: RRR with S1 S2. No murmurs, rubs, or gallops appreciated. Abdomen: Soft, non-tender, non-distended with normoactive bowel sounds. No hepatomegaly. No rebound/guarding. No obvious abdominal masses. Msk:  Strength and tone normal for age. External rotation is uncomfortable. Very tender over the right posterior trochanteric eminence Extremities/Skin: Warm and dry. No clubbing or cyanosis. No edema. No rashes or suspicious lesions. Neuro: Alert and oriented X 3. Moves all extremities spontaneously. Gait is normal. CNII-XII grossly in tact. Psych:  Responds to questions appropriately with a normal affect.    [facility] (PRIMARY) x-ray report read by Dr. Elvina Sidle, MD: Hip- mild arthritic changes of the right hip.   After sterile prep, right trochanter was identified and point of maximum pain palpated. At this point, 1 mL of Depo-Medrol (80 mg per mL) sees and 1 mL of Marcaine 0.5% were injected. There were no complications ASSESSMENT AND PLAN:  69 y.o. year old  female with right trochanteric bursitis   By signing my name below, I, [name], attest that this documentation has been prepared under the direction and in the presence of Cindy Sidle, MD.  Broadus John, Medical Scribe. 06/10/2015.  10:59 AM. This chart was scribed in my presence and reviewed by me personally.    ICD-9-CM ICD-10-CM   1. Right hip pain 719.45 M25.551 DG HIP UNILAT W OR W/O PELVIS 2-3 VIEWS RIGHT  2. Trochanteric bursitis of right hip 726.5 M70.61      Signed, Cindy Sidle, MD  Signed, Cindy Sidle, MD 06/10/2015 10:54 AM

## 2015-09-02 ENCOUNTER — Ambulatory Visit (INDEPENDENT_AMBULATORY_CARE_PROVIDER_SITE_OTHER): Payer: Medicare Other | Admitting: Internal Medicine

## 2015-09-02 ENCOUNTER — Encounter: Payer: Self-pay | Admitting: Internal Medicine

## 2015-09-02 VITALS — BP 110/80 | HR 69 | Wt 160.0 lb

## 2015-09-02 DIAGNOSIS — M25511 Pain in right shoulder: Secondary | ICD-10-CM | POA: Diagnosis not present

## 2015-09-02 NOTE — Progress Notes (Signed)
Subjective:  Patient ID: Cindy Simpson, female    DOB: Apr 03, 1946  Age: 70 y.o. MRN: [id number]  CC: No chief complaint on file.   HPI [facility] presents for Rt torn rotator cuff C/o R thumb non-healing place  Outpatient Prescriptions Prior to Visit  Medication Sig Dispense Refill  . aspirin 325 MG tablet Take 325 mg by mouth daily.      . calcium-vitamin D (OSCAL WITH D) 500-200 MG-UNIT per tablet Take 1 tablet by mouth daily with breakfast.    . Cholecalciferol 1000 UNITS tablet Take 2,000 Units by mouth daily.    . fish oil-omega-3 fatty acids 1000 MG capsule Take 1 g by mouth daily.    Marland Kitchen levothyroxine (SYNTHROID, LEVOTHROID) 75 MCG tablet Take 1 tablet by mouth  daily 90 tablet 3  . loratadine (CLARITIN) 10 MG tablet Take 10 mg by mouth daily.    Marland Kitchen lovastatin (MEVACOR) 10 MG tablet Take 1 tablet by mouth  daily 90 tablet 3  . omeprazole (PRILOSEC) 40 MG capsule Take 1 capsule by mouth  twice daily 180 capsule 3  . triamcinolone ointment (KENALOG) 0.5 % Apply 1 application topically 2 (two) times daily. 30 g 3  . verapamil (CALAN-SR) 240 MG CR tablet Take 1 tablet by mouth at  bedtime 90 tablet 3   No facility-administered medications prior to visit.    ROS Review of Systems  Constitutional: Negative for chills, activity change, appetite change, fatigue and unexpected weight change.  HENT: Negative for congestion, mouth sores and sinus pressure.   Eyes: Negative for visual disturbance.  Respiratory: Negative for cough and chest tightness.   Gastrointestinal: Negative for nausea and abdominal pain.  Genitourinary: Negative for frequency, difficulty urinating and vaginal pain.  Musculoskeletal: Positive for arthralgias. Negative for back pain and gait problem.  Skin: Negative for pallor and rash.  Neurological: Negative for dizziness, tremors, weakness, numbness and headaches.  Psychiatric/Behavioral: Negative for confusion and sleep disturbance.    Objective:  BP  110/80 mmHg  Pulse 69  Wt 160 lb (72.576 kg)  SpO2 95%  BP Readings from Last 3 Encounters:  09/02/15 110/80  06/10/15 106/80  02/28/15 112/82    Wt Readings from Last 3 Encounters:  09/02/15 160 lb (72.576 kg)  06/10/15 160 lb 3.2 oz (72.666 kg)  02/28/15 156 lb (70.761 kg)    Physical Exam  Constitutional: She appears well-developed. No distress.  HENT:  Head: Normocephalic.  Right Ear: External ear normal.  Left Ear: External ear normal.  Nose: Nose normal.  Mouth/Throat: Oropharynx is clear and moist.  Eyes: Conjunctivae are normal. Pupils are equal, round, and reactive to light. Right eye exhibits no discharge. Left eye exhibits no discharge.  Neck: Normal range of motion. Neck supple. No JVD present. No tracheal deviation present. No thyromegaly present.  Cardiovascular: Normal rate, regular rhythm and normal heart sounds.   Pulmonary/Chest: No stridor. No respiratory distress. She has no wheezes.  Abdominal: Soft. Bowel sounds are normal. She exhibits no distension and no mass. There is no tenderness. There is no rebound and no guarding.  Musculoskeletal: She exhibits tenderness. She exhibits no edema.  Lymphadenopathy:    She has no cervical adenopathy.  Neurological: She displays normal reflexes. No cranial nerve deficit. She exhibits normal muscle tone. Coordination normal.  Skin: No rash noted. No erythema.  Psychiatric: She has a normal mood and affect. Her behavior is normal. Judgment and thought content normal.   L shoulder w/muscle atrophy -  painful R med thumb w/a 2 mm hyperkeratosis  Lab Results  Component Value Date   WBC 7.8 02/28/2015   HGB 13.0 02/28/2015   HCT 38.8 02/28/2015   PLT 329.0 02/28/2015   GLUCOSE 91 02/28/2015   CHOL 207* 02/28/2015   TRIG 99.0 02/28/2015   HDL 53.20 02/28/2015   LDLDIRECT 137.5 03/17/2009   LDLCALC 134* 02/28/2015   ALT 12 02/28/2015   AST 17 02/28/2015   NA 139 02/28/2015   K 4.2 02/28/2015   CL 103  02/28/2015   CREATININE 0.87 02/28/2015   BUN 16 02/28/2015   CO2 29 02/28/2015   TSH 3.49 02/28/2015    Dg Bone Density  12/23/2014  Date of study: 12/20/14 Exam: DUAL X-RAY ABSORPTIOMETRY (DXA) FOR BONE MINERAL DENSITY (BMD) Instrument: [employer] Therapist, art Provider: PCP Indication: follow up for low BMD Comparison: none (please note that it is not possible to compare data from different instruments) Clinical data: Pt is a postmenopausal 70 y.o. female with no  previous h/o fracture. On calcium and vitamin D. Results:  Lumbar spine (L1-L4) Femoral neck (FN) 33% distal radius T-score -1.6 RFN:-1.0 LFN:-1.2 n/a Change in BMD from previous DXA test (%) n/a n/a n/a (*) statistically significant Assessment: the BMD is low according to the [facility] classification for osteoporosis (see below). Fracture risk: moderate FRAX score: 10 year major osteoporotic risk: 9.0%. 10 year hip fracture risk: 1.0%. These are under the thresholds for treatment of 20% and 3%, respectively. Comments: the technical quality of the study is good. Evaluation for secondary causes should be considered if clinically indicated. Recommend optimizing calcium (1200 mg/day) and vitamin D (800 IU/day) intake. Followup: Repeat BMD is appropriate after 2 years or after 1-2 years if starting treatment. WHO criteria for diagnosis of osteoporosis in postmenopausal women and in men 42 y/o or older: - normal: T-score -1.0 to + 1.0 - osteopenia/low bone density: T-score between -2.5 and -1.0 - osteoporosis: T-score below -2.5 - severe osteoporosis: T-score below -2.5 with history of fragility fracture Note: although not part of the WHO classification, the presence of a fragility fracture, regardless of the T-score, should be considered diagnostic of osteoporosis, provided other causes for the fracture have been excluded. Treatment: The National Osteoporosis Foundation recommends that treatment be considered in postmenopausal women and men age 60 or  older with: 1. Hip or vertebral (clinical or morphometric) fracture 2. T-score of - 2.5 or lower at the spine or hip 3. 10-year fracture probability by FRAX of at least 20% for a major osteoporotic fracture and 3% for a hip fracture Roxy Manns MD    Assessment & Plan:   There are no diagnoses linked to this encounter. I am having Ms. Haxton maintain her aspirin, fish oil-omega-3 fatty acids, Cholecalciferol, loratadine, calcium-vitamin D, lovastatin, omeprazole, triamcinolone ointment, levothyroxine, and verapamil.  No orders of the defined types were placed in this encounter.     Follow-up: No Follow-up on file.  [name], MD

## 2015-09-02 NOTE — Assessment & Plan Note (Signed)
2/17 - muscle atrophy around the joint - new Ortho ref

## 2015-09-02 NOTE — Progress Notes (Signed)
Pre visit review using our clinic review tool, if applicable. No additional management support is needed unless otherwise documented below in the visit note. 

## 2015-09-03 ENCOUNTER — Telehealth: Payer: Self-pay | Admitting: *Deleted

## 2015-09-03 NOTE — Telephone Encounter (Signed)
I called [employer] at [phone] and spoke to a representative regarding pt's specimen she submitted to them back in 02/2015. The results were negative and a copy is being faxed to [place] at [phone]. I called pt and informed her of these negative results. Will send copy to be scanned in patient's chart once reviewed by PCP.

## 2015-09-13 ENCOUNTER — Inpatient Hospital Stay (HOSPITAL_COMMUNITY)
Admission: EM | Admit: 2015-09-13 | Discharge: 2015-09-14 | DRG: 281 | Disposition: A | Discharge status: Home / Self Care | Payer: Medicare Other | Attending: Cardiology | Admitting: Cardiology

## 2015-09-13 ENCOUNTER — Encounter (HOSPITAL_COMMUNITY): Payer: Self-pay | Admitting: Emergency Medicine

## 2015-09-13 ENCOUNTER — Encounter (HOSPITAL_COMMUNITY)
Admission: EM | Disposition: A | Discharge status: Home / Self Care | Payer: Self-pay | Source: Home / Self Care | Attending: Cardiology

## 2015-09-13 ENCOUNTER — Emergency Department (HOSPITAL_COMMUNITY): Payer: Medicare Other

## 2015-09-13 DIAGNOSIS — Z823 Family history of stroke: Secondary | ICD-10-CM

## 2015-09-13 DIAGNOSIS — Z888 Allergy status to other drugs, medicaments and biological substances status: Secondary | ICD-10-CM | POA: Diagnosis not present

## 2015-09-13 DIAGNOSIS — Z91048 Other nonmedicinal substance allergy status: Secondary | ICD-10-CM

## 2015-09-13 DIAGNOSIS — I480 Paroxysmal atrial fibrillation: Secondary | ICD-10-CM | POA: Diagnosis not present

## 2015-09-13 DIAGNOSIS — K219 Gastro-esophageal reflux disease without esophagitis: Secondary | ICD-10-CM | POA: Diagnosis present

## 2015-09-13 DIAGNOSIS — Z8249 Family history of ischemic heart disease and other diseases of the circulatory system: Secondary | ICD-10-CM

## 2015-09-13 DIAGNOSIS — E039 Hypothyroidism, unspecified: Secondary | ICD-10-CM | POA: Diagnosis present

## 2015-09-13 DIAGNOSIS — E785 Hyperlipidemia, unspecified: Secondary | ICD-10-CM | POA: Diagnosis present

## 2015-09-13 DIAGNOSIS — R0902 Hypoxemia: Secondary | ICD-10-CM | POA: Diagnosis not present

## 2015-09-13 DIAGNOSIS — I119 Hypertensive heart disease without heart failure: Secondary | ICD-10-CM | POA: Diagnosis present

## 2015-09-13 DIAGNOSIS — I10 Essential (primary) hypertension: Secondary | ICD-10-CM | POA: Diagnosis not present

## 2015-09-13 DIAGNOSIS — N390 Urinary tract infection, site not specified: Secondary | ICD-10-CM | POA: Diagnosis not present

## 2015-09-13 DIAGNOSIS — R0602 Shortness of breath: Secondary | ICD-10-CM | POA: Diagnosis not present

## 2015-09-13 DIAGNOSIS — K589 Irritable bowel syndrome without diarrhea: Secondary | ICD-10-CM | POA: Diagnosis not present

## 2015-09-13 DIAGNOSIS — I5181 Takotsubo syndrome: Secondary | ICD-10-CM | POA: Diagnosis not present

## 2015-09-13 DIAGNOSIS — I214 Non-ST elevation (NSTEMI) myocardial infarction: Secondary | ICD-10-CM

## 2015-09-13 DIAGNOSIS — R579 Shock, unspecified: Secondary | ICD-10-CM | POA: Diagnosis not present

## 2015-09-13 HISTORY — PX: CARDIAC CATHETERIZATION: SHX172

## 2015-09-13 LAB — TROPONIN I
TROPONIN I: 2.04 ng/mL — AB (ref <0.031)
Troponin I: 1.74 ng/mL (ref <0.031)
Troponin I: 1.43 ng/mL (ref <0.031)

## 2015-09-13 LAB — BASIC METABOLIC PANEL
Anion gap: 10 (ref 5–15)
BUN: 23 mg/dL — AB (ref 6–20)
CALCIUM: 8.7 mg/dL — AB (ref 8.9–10.3)
CO2: 23 mmol/L (ref 22–32)
CREATININE: 0.75 mg/dL (ref 0.44–1.00)
Chloride: 102 mmol/L (ref 101–111)
GFR calc non Af Amer: >60 mL/min (ref >60)
GLUCOSE: 103 mg/dL — AB (ref 65–99)
Potassium: 3.9 mmol/L (ref 3.5–5.1)
Sodium: 135 mmol/L (ref 135–145)

## 2015-09-13 LAB — CBC WITH DIFFERENTIAL/PLATELET
Basophils Absolute: 0 10*3/uL (ref 0.0–0.1)
Basophils Relative: 0 %
Eosinophils Absolute: 0 10*3/uL (ref 0.0–0.7)
Eosinophils Relative: 0 %
HEMATOCRIT: 37.4 % (ref 36.0–46.0)
HEMOGLOBIN: 11.9 g/dL — AB (ref 12.0–15.0)
LYMPHS ABS: 1.3 10*3/uL (ref 0.7–4.0)
Lymphocytes Relative: 18 %
MCH: 29.5 pg (ref 26.0–34.0)
MCHC: 31.8 g/dL (ref 30.0–36.0)
MCV: 92.8 fL (ref 78.0–100.0)
MONO ABS: 0.9 10*3/uL (ref 0.1–1.0)
MONOS PCT: 13 %
NEUTROS ABS: 4.9 10*3/uL (ref 1.7–7.7)
NEUTROS PCT: 69 %
Platelets: 251 10*3/uL (ref 150–400)
RBC: 4.03 MIL/uL (ref 3.87–5.11)
RDW: 13.1 % (ref 11.5–15.5)
WBC: 7.2 10*3/uL (ref 4.0–10.5)

## 2015-09-13 LAB — CBC
HEMATOCRIT: 36.9 % (ref 36.0–46.0)
Hemoglobin: 11.9 g/dL — ABNORMAL LOW (ref 12.0–15.0)
MCH: 30.6 pg (ref 26.0–34.0)
MCHC: 32.2 g/dL (ref 30.0–36.0)
MCV: 94.9 fL (ref 78.0–100.0)
Platelets: 265 10*3/uL (ref 150–400)
RBC: 3.89 MIL/uL (ref 3.87–5.11)
RDW: 13.3 % (ref 11.5–15.5)
WBC: 8 10*3/uL (ref 4.0–10.5)

## 2015-09-13 LAB — URINE MICROSCOPIC-ADD ON
RBC / HPF: NONE SEEN RBC/hpf (ref 0–5)
SQUAMOUS EPITHELIAL / LPF: NONE SEEN

## 2015-09-13 LAB — COMPREHENSIVE METABOLIC PANEL
ALBUMIN: 3.4 g/dL — AB (ref 3.5–5.0)
ALT: 20 U/L (ref 14–54)
AST: 32 U/L (ref 15–41)
Alkaline Phosphatase: 108 U/L (ref 38–126)
Anion gap: 9 (ref 5–15)
BILIRUBIN TOTAL: 0.4 mg/dL (ref 0.3–1.2)
BUN: 16 mg/dL (ref 6–20)
CO2: 24 mmol/L (ref 22–32)
CREATININE: 0.61 mg/dL (ref 0.44–1.00)
Calcium: 8.8 mg/dL — ABNORMAL LOW (ref 8.9–10.3)
Chloride: 106 mmol/L (ref 101–111)
GFR calc Af Amer: >60 mL/min (ref >60)
GFR calc non Af Amer: >60 mL/min (ref >60)
GLUCOSE: 100 mg/dL — AB (ref 65–99)
POTASSIUM: 4 mmol/L (ref 3.5–5.1)
Sodium: 139 mmol/L (ref 135–145)
TOTAL PROTEIN: 6.1 g/dL — AB (ref 6.5–8.1)

## 2015-09-13 LAB — I-STAT TROPONIN, ED: Troponin i, poc: 1.2 ng/mL (ref 0.00–0.08)

## 2015-09-13 LAB — URINALYSIS, ROUTINE W REFLEX MICROSCOPIC
Bilirubin Urine: NEGATIVE
GLUCOSE, UA: NEGATIVE mg/dL
Ketones, ur: NEGATIVE mg/dL
LEUKOCYTES UA: NEGATIVE
Nitrite: POSITIVE — AB
PH: 5 (ref 5.0–8.0)
Protein, ur: NEGATIVE mg/dL
SPECIFIC GRAVITY, URINE: 1.025 (ref 1.005–1.030)

## 2015-09-13 LAB — PROTIME-INR
INR: 1.2 (ref 0.00–1.49)
Prothrombin Time: 14.9 seconds (ref 11.6–15.2)

## 2015-09-13 LAB — MAGNESIUM: MAGNESIUM: 2 mg/dL (ref 1.7–2.4)

## 2015-09-13 LAB — APTT: aPTT: 31 seconds (ref 24–37)

## 2015-09-13 SURGERY — LEFT HEART CATH AND CORONARY ANGIOGRAPHY
Anesthesia: LOCAL

## 2015-09-13 MED ORDER — IOHEXOL 350 MG/ML SOLN
INTRAVENOUS | Status: DC | PRN
  Administered 2015-09-13: 65 mL via INTRA_ARTERIAL

## 2015-09-13 MED ORDER — LIDOCAINE HCL (PF) 1 % IJ SOLN
INTRAMUSCULAR | Status: AC
  Filled 2015-09-13: qty 30

## 2015-09-13 MED ORDER — VITAMIN D 1000 UNITS PO TABS
2000.0000 [IU] | ORAL_TABLET | Freq: Every day | ORAL | Status: DC
  Administered 2015-09-13 – 2015-09-14 (×2): 2000 [IU] via ORAL
  Filled 2015-09-13 (×2): qty 2

## 2015-09-13 MED ORDER — NITROGLYCERIN 0.4 MG SL SUBL: 0.4000 mg | SUBLINGUAL_TABLET | SUBLINGUAL | Status: DC | PRN

## 2015-09-13 MED ORDER — OMEGA-3-ACID ETHYL ESTERS 1 G PO CAPS
1.0000 g | ORAL_CAPSULE | Freq: Every day | ORAL | Status: DC
  Administered 2015-09-13 – 2015-09-14 (×2): 1 g via ORAL
  Filled 2015-09-13 (×2): qty 1

## 2015-09-13 MED ORDER — CLOPIDOGREL BISULFATE 300 MG PO TABS
300.0000 mg | ORAL_TABLET | Freq: Once | ORAL | Status: AC
  Administered 2015-09-13: 300 mg via ORAL

## 2015-09-13 MED ORDER — ONDANSETRON HCL 4 MG/2ML IJ SOLN
4.0000 mg | Freq: Four times a day (QID) | INTRAMUSCULAR | Status: DC | PRN
  Filled 2015-09-13: qty 2

## 2015-09-13 MED ORDER — SODIUM CHLORIDE 0.9 % WEIGHT BASED INFUSION: 3.0000 mL/kg/h | INTRAVENOUS | Status: AC

## 2015-09-13 MED ORDER — SODIUM CHLORIDE 0.9 % IV SOLN: 250.0000 mL | INTRAVENOUS | Status: DC | PRN

## 2015-09-13 MED ORDER — ACETAMINOPHEN 325 MG PO TABS: 650.0000 mg | ORAL_TABLET | ORAL | Status: DC | PRN

## 2015-09-13 MED ORDER — ASPIRIN 81 MG PO CHEW
324.0000 mg | CHEWABLE_TABLET | Freq: Once | ORAL | Status: AC
  Administered 2015-09-13: 324 mg via ORAL
  Filled 2015-09-13: qty 4

## 2015-09-13 MED ORDER — SODIUM CHLORIDE 0.9% FLUSH: 3.0000 mL | INTRAVENOUS | Status: DC | PRN

## 2015-09-13 MED ORDER — FENTANYL CITRATE (PF) 100 MCG/2ML IJ SOLN
INTRAMUSCULAR | Status: DC | PRN
  Administered 2015-09-13: 25 ug via INTRAVENOUS

## 2015-09-13 MED ORDER — HEPARIN (PORCINE) IN NACL 2-0.9 UNIT/ML-% IJ SOLN
INTRAMUSCULAR | Status: AC
  Filled 2015-09-13: qty 1500

## 2015-09-13 MED ORDER — SODIUM CHLORIDE 0.9 % IV SOLN: INTRAVENOUS | Status: DC

## 2015-09-13 MED ORDER — LISINOPRIL 5 MG PO TABS
5.0000 mg | ORAL_TABLET | Freq: Every day | ORAL | Status: DC
  Administered 2015-09-13 – 2015-09-14 (×2): 5 mg via ORAL
  Filled 2015-09-13 (×2): qty 1

## 2015-09-13 MED ORDER — FAMOTIDINE IN NACL 20-0.9 MG/50ML-% IV SOLN
20.0000 mg | Freq: Once | INTRAVENOUS | Status: AC
  Administered 2015-09-13: 20 mg via INTRAVENOUS

## 2015-09-13 MED ORDER — ASPIRIN EC 81 MG PO TBEC
81.0000 mg | DELAYED_RELEASE_TABLET | Freq: Every day | ORAL | Status: DC
  Administered 2015-09-14: 81 mg via ORAL
  Filled 2015-09-13: qty 1

## 2015-09-13 MED ORDER — MIDAZOLAM HCL 2 MG/2ML IJ SOLN
INTRAMUSCULAR | Status: DC | PRN
  Administered 2015-09-13: 1 mg via INTRAVENOUS

## 2015-09-13 MED ORDER — HEPARIN SODIUM (PORCINE) 5000 UNIT/ML IJ SOLN: 4000.0000 [IU] | Freq: Once | INTRAMUSCULAR | Status: DC

## 2015-09-13 MED ORDER — HEPARIN (PORCINE) IN NACL 100-0.45 UNIT/ML-% IJ SOLN
700.0000 [IU]/h | INTRAMUSCULAR | Status: DC
  Administered 2015-09-13: 700 [IU]/h via INTRAVENOUS
  Filled 2015-09-13 (×2): qty 250

## 2015-09-13 MED ORDER — CIPROFLOXACIN HCL 500 MG PO TABS
500.0000 mg | ORAL_TABLET | Freq: Two times a day (BID) | ORAL | Status: DC
  Administered 2015-09-13 – 2015-09-14 (×2): 500 mg via ORAL
  Filled 2015-09-13 (×2): qty 1

## 2015-09-13 MED ORDER — MIDAZOLAM HCL 2 MG/2ML IJ SOLN
INTRAMUSCULAR | Status: AC
  Filled 2015-09-13: qty 2

## 2015-09-13 MED ORDER — HEPARIN (PORCINE) IN NACL 2-0.9 UNIT/ML-% IJ SOLN
INTRAMUSCULAR | Status: DC | PRN
  Administered 2015-09-13: 10:00:00

## 2015-09-13 MED ORDER — GUAIFENESIN-DM 100-10 MG/5ML PO SYRP: 5.0000 mL | ORAL_SOLUTION | Freq: Four times a day (QID) | ORAL | Status: DC | PRN

## 2015-09-13 MED ORDER — LIDOCAINE HCL (PF) 1 % IJ SOLN
INTRAMUSCULAR | Status: DC | PRN
  Administered 2015-09-13: 15 mL

## 2015-09-13 MED ORDER — ASPIRIN 81 MG PO CHEW: 324.0000 mg | CHEWABLE_TABLET | ORAL | Status: AC

## 2015-09-13 MED ORDER — SODIUM CHLORIDE 0.9% FLUSH
3.0000 mL | Freq: Two times a day (BID) | INTRAVENOUS | Status: DC
  Administered 2015-09-13: 3 mL via INTRAVENOUS

## 2015-09-13 MED ORDER — SODIUM CHLORIDE 0.9% FLUSH: 3.0000 mL | Freq: Two times a day (BID) | INTRAVENOUS | Status: DC

## 2015-09-13 MED ORDER — NITROGLYCERIN IN D5W 200-5 MCG/ML-% IV SOLN
10.0000 ug/min | Freq: Once | INTRAVENOUS | Status: AC
  Administered 2015-09-13: 10 ug/min via INTRAVENOUS
  Filled 2015-09-13: qty 250

## 2015-09-13 MED ORDER — ONDANSETRON HCL 4 MG/2ML IJ SOLN: 4.0000 mg | Freq: Four times a day (QID) | INTRAMUSCULAR | Status: DC | PRN

## 2015-09-13 MED ORDER — FENTANYL CITRATE (PF) 100 MCG/2ML IJ SOLN
INTRAMUSCULAR | Status: AC
  Filled 2015-09-13: qty 2

## 2015-09-13 MED ORDER — ATORVASTATIN CALCIUM 40 MG PO TABS
40.0000 mg | ORAL_TABLET | Freq: Every day | ORAL | Status: DC
  Administered 2015-09-13: 40 mg via ORAL
  Filled 2015-09-13: qty 1

## 2015-09-13 MED ORDER — ASPIRIN 300 MG RE SUPP: 300.0000 mg | RECTAL | Status: AC

## 2015-09-13 MED ORDER — SODIUM CHLORIDE 0.9 % IV SOLN
INTRAVENOUS | Status: DC | PRN
  Administered 2015-09-13: 73 mL/h via INTRAVENOUS
  Administered 2015-09-13: 250 mL

## 2015-09-13 MED ORDER — METOPROLOL TARTRATE 12.5 MG HALF TABLET
12.5000 mg | ORAL_TABLET | Freq: Two times a day (BID) | ORAL | Status: DC
  Administered 2015-09-13 – 2015-09-14 (×3): 12.5 mg via ORAL
  Filled 2015-09-13 (×3): qty 1

## 2015-09-13 MED ORDER — PANTOPRAZOLE SODIUM 40 MG PO TBEC
40.0000 mg | DELAYED_RELEASE_TABLET | Freq: Every day | ORAL | Status: DC
  Administered 2015-09-13 – 2015-09-14 (×2): 40 mg via ORAL
  Filled 2015-09-13 (×2): qty 1

## 2015-09-13 MED ORDER — SODIUM CHLORIDE 0.9 % WEIGHT BASED INFUSION: 1.0000 mL/kg/h | INTRAVENOUS | Status: DC

## 2015-09-13 MED ORDER — HEPARIN SODIUM (PORCINE) 5000 UNIT/ML IJ SOLN
4000.0000 [IU] | Freq: Once | INTRAMUSCULAR | Status: AC
  Administered 2015-09-13: 4000 [IU] via INTRAVENOUS
  Filled 2015-09-13: qty 0.8

## 2015-09-13 SURGICAL SUPPLY — 9 items
CATH INFINITI 5FR MULTPACK ANG (CATHETERS) ×2 IMPLANT
ELECT DEFIB PAD ADLT CADENCE (PAD) ×2 IMPLANT
KIT HEART LEFT (KITS) ×2 IMPLANT
PACK CARDIAC CATHETERIZATION (CUSTOM PROCEDURE TRAY) ×2 IMPLANT
SHEATH PINNACLE 6F 10CM (SHEATH) ×2 IMPLANT
SYR MEDRAD MARK V 150ML (SYRINGE) ×2 IMPLANT
TRANSDUCER W/STOPCOCK (MISCELLANEOUS) ×2 IMPLANT
TUBING CIL FLEX 10 FLL-RA (TUBING) ×1 IMPLANT
WIRE EMERALD 3MM-J .035X150CM (WIRE) ×2 IMPLANT

## 2015-09-13 NOTE — ED Notes (Signed)
Per EMS, pt. From home with complaint of SOB, denied chest pain. Pt. Reported to EMS that she woke up with difficulty of breath at 5am today. Received breathing treatment from [employer]. , reported relief of SOB. husband reported that pt. Was taking OTC meds for her colds last night and went to bed ok. Pt. Denied SOB upon arrival to ED,alert and oriented x3.

## 2015-09-13 NOTE — H&P (Signed)
Cindy Simpson is an 70 y.o. female.   Chief Complaint: vague retrosternal chest discomfort associated with shortness of breath and diaphoresis [name] is 70 year old female with past medical history significant forhypertensionquestionable irregular heartbeatpossibly Roxicet atrial fibrillation,hyperlipidemia, hypothyroidism, and GERD,history of IBS, history of asthmatic bronchitis,came to the ER by EMScomplaining offsudden onset of shortness of breath which woke her up around 5 AM received reading treatment with improvement in her symptoms EKG done in the ED showed ormal sinus rhythm with right axis deviation and nonspecificST-T wave changes in high lateral leads and was noted to have minimally elevated troponin Iwhen seen in the ED patient complained of vague chest discomfort associated with shortness of breath and diaphoresisand was noted to be hypotensive with blood pressure in 70s repeat EKG showednormal sinus rhythm with right axis deviation and minimal ST elevation and T wave inversion in high lateral leads Patient received IV fluid bolus  300 mg off Plavix with improvement in her symptoms.  Past Medical History  Diagnosis Date  . GERD (gastroesophageal reflux disease)   . Hyperlipidemia   . Hypothyroidism   . Allergic rhinitis   . IBS (irritable bowel syndrome)   . Insomnia   . Asthmatic bronchitis 2009    allergies; post nasal drip but not allergies  . Torticollis 2006    Estimate date; rec'd botox and resolved  . Irregular heart beat Not sure; long time ago    Periodic irregular heart rate; take verapamil.    Past Surgical History  Procedure Laterality Date  . Abdominal hysterectomy    . Cataract extraction Right 2014    approx 2 years ago  . Tubal ligation Bilateral 1985    Family History  Problem Relation Age of Onset  . Hypertension Other   . Hypertension Mother   . Heart disease Father   . Stroke Father   . Stroke Brother     brain aneurism   Social History:   reports that she has never smoked. She has never used smokeless tobacco. She reports that she drinks about 0.6 - 1.2 oz of alcohol per week. She reports that she does not use illicit drugs.  Allergies:  Allergies  Allergen Reactions  . Metoclopramide Hcl Other (See Comments)    Neck spasms  . Vinegar [Acetic Acid] Swelling    Throat closes trouble breathing with vinegar.     (Not in a hospital admission)  Results for orders placed or performed during the hospital encounter of 09/13/15 (from the past 48 hour(s))  CBC     Status: Abnormal   Collection Time: 09/13/15  6:31 AM  Result Value Ref Range   WBC 8.0 4.0 - 10.5 K/uL   RBC 3.89 3.87 - 5.11 MIL/uL   Hemoglobin 11.9 (L) 12.0 - 15.0 g/dL   HCT 36.9 36.0 - 46.0 %   MCV 94.9 78.0 - 100.0 fL   MCH 30.6 26.0 - 34.0 pg   MCHC 32.2 30.0 - 36.0 g/dL   RDW 13.3 11.5 - 15.5 %   Platelets 265 150 - 400 K/uL  Basic metabolic panel     Status: Abnormal   Collection Time: 09/13/15  6:31 AM  Result Value Ref Range   Sodium 135 135 - 145 mmol/L   Potassium 3.9 3.5 - 5.1 mmol/L   Chloride 102 101 - 111 mmol/L   CO2 23 22 - 32 mmol/L   Glucose, Bld 103 (H) 65 - 99 mg/dL   BUN 23 (H) 6 - 20 mg/dL  Creatinine, Ser 0.75 0.44 - 1.00 mg/dL   Calcium 8.7 (L) 8.9 - 10.3 mg/dL   GFR calc non Af Amer >60 >60 mL/min   GFR calc Af Amer >60 >60 mL/min    Comment: (NOTE) The eGFR has been calculated using the CKD EPI equation. This calculation has not been validated in all clinical situations. eGFR's persistently <60 mL/min signify possible Chronic Kidney Disease.    Anion gap 10 5 - 15  I-Stat Troponin, ED (not at [facility])     Status: Abnormal   Collection Time: 09/13/15  6:36 AM  Result Value Ref Range   Troponin i, poc 1.20 (HH) 0.00 - 0.08 ng/mL   Comment NOTIFIED PHYSICIAN    Comment 3            Comment: Due to the release kinetics of cTnI, a negative result within the first hours of the onset of symptoms does not rule out myocardial  infarction with certainty. If myocardial infarction is still suspected, repeat the test at appropriate intervals.   Protime-INR     Status: None   Collection Time: 09/13/15  7:05 AM  Result Value Ref Range   Prothrombin Time 14.9 11.6 - 15.2 seconds   INR 1.20 0.00 - 1.49  Magnesium     Status: None   Collection Time: 09/13/15  7:05 AM  Result Value Ref Range   Magnesium 2.0 1.7 - 2.4 mg/dL  Troponin I     Status: Abnormal   Collection Time: 09/13/15  7:05 AM  Result Value Ref Range   Troponin I 2.04 (HH) <0.031 ng/mL    Comment:        POSSIBLE MYOCARDIAL ISCHEMIA. SERIAL TESTING RECOMMENDED. CRITICAL RESULT CALLED TO, READ BACK BY AND VERIFIED WITH: L.JOHNSON RN AT 0754 ON 09/13/15 BY S.VANHOORNE   Urinalysis, Routine w reflex microscopic (not at [facility])     Status: Abnormal   Collection Time: 09/13/15  7:48 AM  Result Value Ref Range   Color, Urine YELLOW YELLOW   APPearance CLEAR CLEAR   Specific Gravity, Urine 1.025 1.005 - 1.030   pH 5.0 5.0 - 8.0   Glucose, UA NEGATIVE NEGATIVE mg/dL   Hgb urine dipstick TRACE (A) NEGATIVE   Bilirubin Urine NEGATIVE NEGATIVE   Ketones, ur NEGATIVE NEGATIVE mg/dL   Protein, ur NEGATIVE NEGATIVE mg/dL   Nitrite POSITIVE (A) NEGATIVE   Leukocytes, UA NEGATIVE NEGATIVE  Urine microscopic-add on     Status: Abnormal   Collection Time: 09/13/15  7:48 AM  Result Value Ref Range   Squamous Epithelial / LPF NONE SEEN NONE SEEN   WBC, UA 0-5 0 - 5 WBC/hpf   RBC / HPF NONE SEEN 0 - 5 RBC/hpf   Bacteria, UA MANY (A) NONE SEEN   Dg Chest 2 View  09/13/2015  CLINICAL DATA:  Acute onset of shortness of breath. Initial encounter. EXAM: CHEST  2 VIEW COMPARISON:  Chest radiograph from 08/21/2014 FINDINGS: The lungs are well-aerated and clear. There is no evidence of focal opacification, pleural effusion or pneumothorax. The heart is borderline normal in size. No acute osseous abnormalities are seen. A relatively large hiatal hernia is noted.  IMPRESSION: Relatively large hiatal hernia noted. No acute cardiopulmonary process seen. Electronically Signed   By: Garald Balding M.D.   On: 09/13/2015 06:44    Review of Systems  Constitutional: Positive for diaphoresis. Negative for fever, chills and weight loss.  Eyes: Negative for double vision and photophobia.  Respiratory: Positive for shortness of  breath. Negative for cough, hemoptysis and sputum production.   Cardiovascular: Positive for chest pain.  Gastrointestinal: Positive for nausea.  Genitourinary: Negative for dysuria.  Neurological: Positive for dizziness and weakness. Negative for headaches.    Blood pressure 70/56, pulse 48, temperature 98.1 F (36.7 C), temperature source Oral, resp. rate 17, SpO2 100 %. Physical Exam  Constitutional: She is oriented to person, place, and time.  HENT:  Head: Normocephalic and atraumatic.  Eyes: Conjunctivae are normal. Pupils are equal, round, and reactive to light. Left eye exhibits no discharge.  Neck: Normal range of motion. Neck supple. No JVD present. No tracheal deviation present. No thyromegaly present.  Cardiovascular: Normal rate and regular rhythm.   Murmur (soft systolic murmur  and S4 gallop noted) heard. Respiratory: Effort normal and breath sounds normal.  GI: Soft. Bowel sounds are normal. She exhibits no distension. There is no tenderness.  Musculoskeletal: She exhibits no edema or tenderness.  Neurological: She is alert and oriented to person, place, and time.     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 Fib Hyperlipidemia Hypothyroidism GERD History of IBS Morbid obesity Plan Discussed with patient and her husband regarding emergency left cath possible PTCA stenting its risk and benefits i.e. Death MI stroke need for emergency CABG local vascular complications etc. And consents for PCI  Charolette Forward,  MD 09/13/2015, 9:12 AM

## 2015-09-13 NOTE — ED Notes (Signed)
2.04 critical troponin called from lab. Charlann Boxer RN made aware.

## 2015-09-13 NOTE — ED Provider Notes (Signed)
Patient presented to the ER with shortness of breath. Patient not expressing any chest pain associated with the symptoms. She reports waking up at 5 AM short of breath, received a breathing treatment with improvement.  Face to face Exam: HEENT - PERRLA Lungs - CTAB Heart - RRR, no M/R/G Abd - S/NT/ND Neuro - alert, oriented x3  Plan: Patient has initial point-of-care troponin that is elevated. Will recheck formal troponin and consult cardiology if remains elevated. EKG did not show any evidence of ischemia or infarct. Recheck of the patient reveals that she is no longer short of breath and is not experiencing chest pain.  Orpah Greek, MD 09/13/15 [phone]

## 2015-09-13 NOTE — ED Notes (Signed)
Patient transported to X-ray 

## 2015-09-13 NOTE — Progress Notes (Signed)
ANTICOAGULATION CONSULT NOTE - Initial Consult  Pharmacy Consult for IV Heparin Indication: chest pain/ACS  Allergies  Allergen Reactions  . Metoclopramide Hcl Other (See Comments)    Neck spasms  . Vinegar [Acetic Acid] Swelling    Throat closes trouble breathing with vinegar.    Patient Measurements:   Heparin Dosing Weight: 58 kg  Vital Signs: Temp: 98.1 F (36.7 C) (02/25 0549) Temp Source: Oral (02/25 0549) BP: 126/75 mmHg (02/25 0700) Pulse Rate: 77 (02/25 0700)  Labs:  Recent Labs  09/13/15 0631 09/13/15 0705  HGB 11.9*  --   HCT 36.9  --   PLT 265  --   LABPROT  --  14.9  INR  --  1.20  CREATININE 0.75  --   TROPONINI  --  2.04*    Estimated Creatinine Clearance: 62.5 mL/min (by C-G formula based on Cr of 0.75).   Medical History: Past Medical History  Diagnosis Date  . GERD (gastroesophageal reflux disease)   . Hyperlipidemia   . Hypothyroidism   . Allergic rhinitis   . IBS (irritable bowel syndrome)   . Insomnia   . Asthmatic bronchitis 2009    allergies; post nasal drip but not allergies  . Torticollis 2006    Estimate date; rec'd botox and resolved  . Irregular heart beat Not sure; long time ago    Periodic irregular heart rate; take verapamil.    Medications:   (Not in a hospital admission) Scheduled:  . heparin  4,000 Units Intravenous Once  . nitroGLYCERIN  10 mcg/min Intravenous Once   Infusions:  . heparin      Assessment: 2 yoF c/o SOB with Trop =2.04.  IV Heparin per Rx for ACS. Goal of Therapy:  Heparin level 0.3-0.7 units/ml Monitor platelets by anticoagulation protocol: Yes   Plan:   Add aPtt to coags already drawn  Heparin 4000 unit bolus x1 then drip @ 700 units/hr  Daily CBC/HL  Check 1st HL in 6 hours  Lawana Pai R 09/13/2015,8:28 AM

## 2015-09-13 NOTE — ED Notes (Addendum)
RN called to bedside by Cardiologist. Patient experiencing a sudden onset of shortness of breath, nausea, hypotension and diaphoresis. Nitro stopped per verbal from Cardiologist. Patient will be transport to [facility] per Cardiologist.

## 2015-09-13 NOTE — ED Notes (Signed)
Bed: WA03 Expected date:  Expected time:  Means of arrival:  Comments: EMS 75F [facility]

## 2015-09-13 NOTE — ED Provider Notes (Signed)
CSN: [id number]     Arrival date & time 09/13/15  0534 History   First MD Initiated Contact with Patient 09/13/15 [phone]     Chief Complaint  Patient presents with  . Shortness of Breath   (Consider location/radiation/quality/duration/timing/severity/associated sxs/prior Treatment) HPI 70 y.o. female presents to the Emergency Department today complaining of shortness of breath this morning around 4AM. States that she was propped up in her bed when she felt that she couldn't catch her breath. Called EMS. They gave a breathing treatment which improved symptoms. No hx Asthma, COPD, CHF. States that this has happened in the past, but they never went to the doctor for breathing problems. Has associated cough, rhinorrhea, sinus congestion. No fevers. No CP/ABD pain. No N/V/D. Tried OTC Nyquil with minimal relief. No pain currently. Reports sick contacts. Flu shot UTD. Does not use home O2. No other symptoms noted. No hx MI. No FH. No Risk factors: HTN, HLD, DM, Smoking    Past Medical History  Diagnosis Date  . GERD (gastroesophageal reflux disease)   . Hyperlipidemia   . Hypothyroidism   . Allergic rhinitis   . IBS (irritable bowel syndrome)   . Insomnia   . Asthmatic bronchitis 2009    allergies; post nasal drip but not allergies  . Torticollis 2006    Estimate date; rec'd botox and resolved  . Irregular heart beat Not sure; long time ago    Periodic irregular heart rate; take verapamil.   Past Surgical History  Procedure Laterality Date  . Abdominal hysterectomy    . Cataract extraction Right 2014    approx 2 years ago  . Tubal ligation Bilateral 1985   Family History  Problem Relation Age of Onset  . Hypertension Other   . Hypertension Mother   . Heart disease Father   . Stroke Father   . Stroke Brother     brain aneurism   Social History  Substance Use Topics  . Smoking status: Never Smoker   . Smokeless tobacco: Never Used  . Alcohol Use: 0.6 - 1.2 oz/week    1-2 Shots of  liquor per week     Comment: Perhaps every couple of weeks; not very frequently   OB History    No data available     Review of Systems ROS reviewed and all are negative for acute change except as noted in the HPI.  Allergies  Metoclopramide hcl  Home Medications   Prior to Admission medications   Medication Sig Start Date End Date Taking? Authorizing Provider  aspirin 325 MG tablet Take 325 mg by mouth daily.      Historical Provider, MD  calcium-vitamin D (OSCAL WITH D) 500-200 MG-UNIT per tablet Take 1 tablet by mouth daily with breakfast.    Historical Provider, MD  Cholecalciferol 1000 UNITS tablet Take 2,000 Units by mouth daily. 10/13/10   Aleksei [name] V, MD  fish oil-omega-3 fatty acids 1000 MG capsule Take 1 g by mouth daily.    Historical Provider, MD  levothyroxine (SYNTHROID, LEVOTHROID) 75 MCG tablet Take 1 tablet by mouth  daily 03/11/15   Aleksei [name] V, MD  loratadine (CLARITIN) 10 MG tablet Take 10 mg by mouth daily.    Historical Provider, MD  lovastatin (MEVACOR) 10 MG tablet Take 1 tablet by mouth  daily 12/23/14   Cassandria Anger, MD  omeprazole (PRILOSEC) 40 MG capsule Take 1 capsule by mouth  twice daily 12/23/14   Cassandria Anger, MD  triamcinolone ointment (KENALOG)  0.5 % Apply 1 application topically 2 (two) times daily. 02/28/15   Aleksei [name] V, MD  verapamil (CALAN-SR) 240 MG CR tablet Take 1 tablet by mouth at  bedtime 03/11/15   Aleksei [name] V, MD   BP 123/80 mmHg  Pulse 71  Temp(Src) 98.1 F (36.7 C) (Oral)  Resp 16  SpO2 95%  On 2LNC  Physical Exam  Constitutional: She is oriented to person, place, and time. She appears well-developed and well-nourished.  HENT:  Head: Normocephalic and atraumatic.  Right Ear: Tympanic membrane, external ear and ear canal normal.  Left Ear: Tympanic membrane, external ear and ear canal normal.  Nose: Nose normal.  Mouth/Throat: Uvula is midline, oropharynx is clear and moist and mucous  membranes are normal.  Eyes: EOM are normal. Pupils are equal, round, and reactive to light.  Neck: Normal range of motion. Neck supple. No tracheal deviation present.  Cardiovascular: Normal rate, regular rhythm, S1 normal, S2 normal, normal heart sounds, intact distal pulses and normal pulses.   No murmur heard. Pulmonary/Chest: Effort normal. No accessory muscle usage. No respiratory distress. She has wheezes in the right upper field and the left upper field. She has rhonchi in the right upper field, the right lower field, the left upper field and the left lower field. She exhibits no tenderness.  Abdominal: Soft. Normal appearance and bowel sounds are normal. There is no tenderness.  Musculoskeletal: Normal range of motion.  Neurological: She is alert and oriented to person, place, and time.  Skin: Skin is warm and dry.  Psychiatric: She has a normal mood and affect. Her behavior is normal. Thought content normal.  Nursing note and vitals reviewed.  ED Course  Procedures (including critical care time) Labs Review Labs Reviewed  CBC - Abnormal; Notable for the following:    Hemoglobin 11.9 (*)    All other components within normal limits  BASIC METABOLIC PANEL - Abnormal; Notable for the following:    Glucose, Bld 103 (*)    BUN 23 (*)    Calcium 8.7 (*)    All other components within normal limits  TROPONIN I - Abnormal; Notable for the following:    Troponin I 2.04 (*)    All other components within normal limits  I-STAT TROPOININ, ED - Abnormal; Notable for the following:    Troponin i, poc 1.20 (*)    All other components within normal limits  PROTIME-INR  MAGNESIUM  URINALYSIS, ROUTINE W REFLEX MICROSCOPIC (NOT AT [facility])  APTT   Imaging Review Dg Chest 2 View  09/13/2015  CLINICAL DATA:  Acute onset of shortness of breath. Initial encounter. EXAM: CHEST  2 VIEW COMPARISON:  Chest radiograph from 08/21/2014 FINDINGS: The lungs are well-aerated and clear. There is no  evidence of focal opacification, pleural effusion or pneumothorax. The heart is borderline normal in size. No acute osseous abnormalities are seen. A relatively large hiatal hernia is noted. IMPRESSION: Relatively large hiatal hernia noted. No acute cardiopulmonary process seen. Electronically Signed   By: Garald Balding M.D.   On: 09/13/2015 06:44   I have personally reviewed and evaluated these images and lab results as part of my medical decision-making.   EKG Interpretation   Date/Time:  Saturday September 13 2015 06:44:31 EST Ventricular Rate:  73 PR Interval:  149 QRS Duration: 100 QT Interval:  408 QTC Calculation: 450 R Axis:   111 Text Interpretation:  Sinus rhythm Right axis deviation Low voltage,  precordial leads No significant change since last  tracing Confirmed by  POLLINA  MD, Martin [phone]) on 09/13/2015 6:57:39 AM      MDM  I have reviewed and evaluated the relevant laboratory values. I have reviewed and evaluated the relevant imaging studies. I personally evaluated and interpreted the relevant EKG. I have reviewed the relevant previous healthcare records. I have reviewed EMS Documentation. I obtained HPI from historian. Patient discussed with supervising physician  ED Course:  Assessment: 54y F presents with shortness of breath since this morning around 4AM. Given breathing treatment by EMS with improvement of symptoms. No Hx CHF, Asthma, COPD. On exam, NAD. Nonspetic/nontoxic appearing. VSS. Afebrile. Lungs have rhonchi bilaterally. Scattered wheezing. Heart RRR. Abdomen nontender/soft. Currently on 2L [place] due to hypoxia. Does not use Home O2.     7:13 AM- Elevated istat Trop- 1.2. Due to patient not having CP and speaking with supervising physician, ordered formal Trop I and holding off on Heparinizing until lab returns. EKG shows no sign of infarct. NSR. Will consult with Cardiology with formal Trop returns. Other other labs unremarkable. CXR shows no acute pulmonary  infiltrate. Given 324 ASA.     8:09 AM- Spoke with Dr. Terrence Dupont (Cardiology) due to elevated Trop 2.04. Started on Heparin and Nitro. Will admit to Cardiology for PCI.  Disposition/Plan:  Admit to Cardiology Pt acknowledges and agrees with plan  Supervising Physician Orpah Greek, MD   Final diagnoses:  NSTEMI (non-ST elevated myocardial infarction) [facility])      Shary Decamp, PA-C 09/13/15 [place], MD 09/14/15 [phone]

## 2015-09-13 NOTE — ED Notes (Signed)
Blood pressure improving with and symptoms subsiding at this time.

## 2015-09-14 LAB — LIPID PANEL
Cholesterol: 166 mg/dL (ref 0–200)
HDL: 45 mg/dL (ref >40)
LDL CALC: 102 mg/dL — AB (ref 0–99)
Total CHOL/HDL Ratio: 3.7 RATIO
Triglycerides: 93 mg/dL (ref <150)
VLDL: 19 mg/dL (ref 0–40)

## 2015-09-14 LAB — BASIC METABOLIC PANEL
Anion gap: 9 (ref 5–15)
BUN: 16 mg/dL (ref 6–20)
CHLORIDE: 103 mmol/L (ref 101–111)
CO2: 22 mmol/L (ref 22–32)
CREATININE: 0.68 mg/dL (ref 0.44–1.00)
Calcium: 8.6 mg/dL — ABNORMAL LOW (ref 8.9–10.3)
GFR calc non Af Amer: >60 mL/min (ref >60)
GLUCOSE: 115 mg/dL — AB (ref 65–99)
Potassium: 3.7 mmol/L (ref 3.5–5.1)
Sodium: 134 mmol/L — ABNORMAL LOW (ref 135–145)

## 2015-09-14 LAB — CBC
HEMATOCRIT: 33.6 % — AB (ref 36.0–46.0)
Hemoglobin: 11.1 g/dL — ABNORMAL LOW (ref 12.0–15.0)
MCH: 30.8 pg (ref 26.0–34.0)
MCHC: 33 g/dL (ref 30.0–36.0)
MCV: 93.3 fL (ref 78.0–100.0)
Platelets: 237 10*3/uL (ref 150–400)
RBC: 3.6 MIL/uL — ABNORMAL LOW (ref 3.87–5.11)
RDW: 13.3 % (ref 11.5–15.5)
WBC: 7.5 10*3/uL (ref 4.0–10.5)

## 2015-09-14 LAB — TROPONIN I: Troponin I: 1.64 ng/mL (ref <0.031)

## 2015-09-14 MED ORDER — CIPROFLOXACIN HCL 500 MG PO TABS: 500.0000 mg | ORAL_TABLET | Freq: Two times a day (BID) | ORAL | Status: DC

## 2015-09-14 MED ORDER — NITROGLYCERIN 0.4 MG SL SUBL: 0.4000 mg | SUBLINGUAL_TABLET | SUBLINGUAL | Status: DC | PRN

## 2015-09-14 MED ORDER — ATORVASTATIN CALCIUM 40 MG PO TABS: 40.0000 mg | ORAL_TABLET | Freq: Every day | ORAL | Status: DC

## 2015-09-14 MED ORDER — METOPROLOL TARTRATE 25 MG PO TABS: 12.5000 mg | ORAL_TABLET | Freq: Two times a day (BID) | ORAL | Status: DC

## 2015-09-14 MED ORDER — LISINOPRIL 5 MG PO TABS: 5.0000 mg | ORAL_TABLET | Freq: Every day | ORAL | Status: DC

## 2015-09-14 MED ORDER — ASPIRIN 81 MG PO TBEC: 81.0000 mg | DELAYED_RELEASE_TABLET | Freq: Every day | ORAL | Status: DC

## 2015-09-14 NOTE — Discharge Summary (Signed)
Discharge summary dictated on 09/14/2015 dictation number is [phone]

## 2015-09-14 NOTE — Progress Notes (Signed)
Utilization review completed.  

## 2015-09-14 NOTE — Discharge Instructions (Signed)
Angiogram An angiogram is an X-ray test. It is used to look at your blood vessels. For this test, a dye is put into the blood vessel being checked. The dye shows up on X-rays. It helps your doctor see if there is a blockage or other problem in the blood vessel. BEFORE THE PROCEDURE  Follow your doctor's instructions about limiting what you eat or drink.  Ask your doctor if you may drink enough water to take any needed medicines the morning of the test.  Plan to have someone take you home after the test.  If you go home the same day as the test, plan to have someone stay with you for 24 hours. PROCEDURE   An IV tube will be put into one of your veins.  You will be given a medicine that makes you relax (sedative).  Your skin will be washed and shaved where the thin tube (catheter) will be inserted. This will usually be done in the upper part of your leg (groin). It may also be done in your arm near the elbow or in your wrist.  You will be given a medicine that numbs the area where the tube will be inserted (local anesthetic).  The tube will be inserted into a blood vessel.  Using a type of X-ray (fluoroscopy) to see, your doctor will move the tube into the blood vessel to check it.  Dye will be put in through the tube. X-rays of your blood vessels will then be taken. Different health care providers and hospitals may do this procedure differently. AFTER THE PROCEDURE   If the test is done through the leg, you will be kept in bed lying flat for several hours. You will be told to not bend or cross your legs.   The area where the tube was inserted will be checked often.   The pulse in your feet or wrist will be checked often.   More tests or X-rays may be done.    This information is not intended to replace advice given to you by your health care provider. Make sure you discuss any questions you have with your health care provider.   Document Released: 10/01/2008 Document  Revised: 07/26/2014 Document Reviewed: 12/06/2012 Elsevier Interactive Patient Education 2016 Else[employer]  Acute Coronary Syndrome Acute coronary syndrome (ACS) is a serious problem in which there is suddenly not enough blood and oxygen supplied to the heart. ACS may mean that one or more of the blood vessels in your heart (coronary arteries) may be blocked. ACS can result in chest pain or a heart attack (myocardial infarction or MI). CAUSES This condition is caused by atherosclerosis, which is the buildup of fat and cholesterol (plaque) on the inside of the arteries. Over time, the plaque may narrow or block the artery, and this will lessen blood flow to the heart. Plaque can also become weak and break off within a coronary artery to form a clot and cause a sudden blockage. RISK FACTORS The risks factors of this condition include:  High cholesterol levels.  High blood pressure (hypertension).  Smoking.  Diabetes.  Age.  Family history of chest pain, heart disease, or stroke.  Lack of exercise. SYMPTOMS The most common signs of this condition include:  Chest pain, which can be:  A crushing or squeezing in the chest.  A tightness, pressure, fullness, or heaviness in the chest.  Present for more than a few minutes, or it can stop and recur.  Pain in the  arms, neck, jaw, or back.  Unexplained heartburn or indigestion.  Shortness of breath.  Nausea.  Sudden cold sweats.  Feeling light-headed or dizzy. Sometimes, this condition has no symptoms. DIAGNOSIS ACS may be diagnosed through the following tests:  Electrocardiogram (ECG).  Blood tests.  Coronary angiogram. This is a procedure to look at the coronary arteries to see if there is any blockage. TREATMENT Treatment for ACS may include:  Healthy behavioral changes to reduce or control risk factors.  Medicine.  Coronary stenting.A stent helps to keep an artery open.  Coronary angioplasty. This procedure  widens a narrowed or blocked artery.  Coronary artery bypass surgery. This will allow your blood to pass the blockage (bypass) to reach your heart. HOME CARE INSTRUCTIONS Eating and Drinking  Follow a heart-healthy diet. A dietitian can you help to educate you about healthy food options and changes.  Use healthy cooking methods such as roasting, grilling, broiling, baking, poaching, steaming, or stir-frying. Talk to a dietitian to learn more about healthy cooking methods. Medicines  Take medicines only as directed by your health care provider.  Do not take the following medicines unless your health care provider approves:  Nonsteroidal anti-inflammatory drugs (NSAIDs), such as ibuprofen, naproxen, or celecoxib.  Vitamin supplements that contain vitamin A, vitamin E, or both.  Hormone replacement therapy that contains estrogen with or without progestin.  Stop illegal drug use. Activities  Follow an exercise program that is approved by your health care provider.  Plan rest periods when you are fatigued. Lifestyle  Do not use any tobacco products, including cigarettes, chewing tobacco, or electronic cigarettes. If you need help quitting, ask your health care provider.  If you drink alcohol, and your health care provider approves, limit your alcohol intake to no more than 1 drink per day. One drink equals 12 ounces of beer, 5 ounces of wine, or 1 ounces of hard liquor.  Learn to manage stress.  Maintain a healthy weight. Lose weight as approved by your health care provider. General Instructions  Manage other health conditions, such as hypertension and diabetes, as directed by your health care provider.  Keep all follow-up visits as directed by your health care provider. This is important.  Your health care provider may ask you to monitor your blood pressure. A blood pressure reading consists of a higher number over a lower number, such as 110 over 72, written as 110/72.  Ideally, your blood pressure should be:  Below 140/90 if you have no other medical conditions.  Below 130/80 if you have diabetes or kidney disease. SEEK IMMEDIATE MEDICAL CARE IF:  You have pain in your chest, neck, arm, jaw, stomach, or back that lasts more than a few minutes, is recurring, or is not relieved by taking medicine under your tongue (sublingual nitroglycerin).  You have profuse sweating without cause.  You have unexplained:  Heartburn or indigestion.  Shortness of breath or difficulty breathing.  Nausea or vomiting.  Fatigue.  Feelings of nervousness or anxiety.  Weakness.  Diarrhea.  You have sudden light-headedness or dizziness.  You faint. These symptoms may represent a serious problem that is an emergency. Do not wait to see if the symptoms will go away. Get medical help right away. Call your local emergency services (911 in the [place] Do not drive yourself to the clinic or hospital.   This information is not intended to replace advice given to you by your health care provider. Make sure you discuss any questions you have with your  health care provider.   Document Released: 07/05/2005 Document Revised: 07/26/2014 Document Reviewed: 11/06/2013 Elsevier Interactive Patient Education [employer].

## 2015-09-15 ENCOUNTER — Encounter (HOSPITAL_COMMUNITY): Payer: Self-pay | Admitting: Cardiology

## 2015-09-15 LAB — POCT ACTIVATED CLOTTING TIME: Activated Clotting Time: 183 seconds

## 2015-09-15 NOTE — Discharge Summary (Signed)
NAMELISANNE, COBB               ACCOUNT NO.:  [id number]  MEDICAL RECORD NO.:  [id number]  LOCATION:  3W15C                        FACILITY:  [facility]  PHYSICIAN:  Toddrick Sanna N. Sharyn Lull, M.D. DATE OF BIRTH:  Sep 07, 1945  DATE OF ADMISSION:  09/13/2015 DATE OF DISCHARGE:  09/14/2015                              DISCHARGE SUMMARY   ADMITTING DIAGNOSES: 1. Acute non-Q-wave myocardial infarction. 2. Status post hypotensive shock. 3. Hypertension. 4. Probable history of paroxysmal atrial fibrillation,     questionable/irregular beats in the past. 5. Hyperlipidemia. 6. Hypothyroidism. 7. Gastroesophageal reflux disease. 8. History of irritable bowel syndrome. 9. Morbid obesity.  DISCHARGE DIAGNOSES: 1. Status post probable very small non-Q-wave myocardial     infarction/Takotsubo cardiomyopathy. 2. Status post hypotensive shock. 3. Hypertension. 4. History of irregular heartbeat/questionable paroxysmal atrial     fibrillation in remote past, old records not available. 5. Hyperlipidemia. 6. Hypothyroidism. 7. Gastroesophageal reflux disease. 8. History of irritable bowel syndrome. 9. Morbid obesity. 10.Urinary tract infection.  DISCHARGE HOME MEDICATIONS: 1. Aspirin 81 mg 1 tablet daily. 2. Atorvastatin 40 mg 1 tablet daily. 3. Ciprofloxacin 500 mg twice daily for 7 days. 4. Lisinopril 5 mg daily. 5. Metoprolol tartrate 25 mg half tablet twice daily. 6. Nitrostat sublingual p.r.n. 7. Cholecalciferol 1000 units daily. 8. Cinnamon capsule 500 mg daily. 9. Omega-3 fish oil 1000 mg daily. 10.Claritin 10 mg as needed. 11.Multivitamin with mineral daily as needed. 12.Kenalog ointment, apply locally as before. 13.Levothyroxine 75 mcg daily. 14.Omeprazole 40 mg 1 capsule twice daily.  The patient has been     advised to stop aspirin 325 mg and switch to 81 mg.  The patient     has been advised to stop naproxen sodium and also stop verapamil.  DIET:  Low salt, low  cholesterol.  ACTIVITY:  Increase activity slowly as tolerated.  Post cardiac cath instructions have been given.  CONDITION AT DISCHARGE:  Stable.  BRIEF HISTORY AND HOSPITAL COURSE:  Ms. Senger is a 70 year old female, with past medical history significant for hypertension, questionable irregular heartbeat, possibly paroxysmal atrial fibrillation, although old records not available, hypertension, hyperlipidemia, hypothyroidism, GERD, history of IBS, history of asthmatic bronchitis.  She came to the ER by EMS complaining of sudden onset of shortness of breath, which woke her up around 5 a.m.  The patient received breathing treatment with improvement in her symptoms.  EKG done in the ED showed normal sinus rhythm with right axis deviation, nonspecific ST-T wave changes in high lateral leads and was noted to have minimally elevated troponin I.  When seen in the ED, the patient complained of vague chest discomfort associated with shortness of breath, diaphoresis, and was noted to be hypotensive with blood pressure in 70s.  Repeat EKG showed normal sinus rhythm with right axis deviation, and minimal ST elevation and T-wave inversion in high lateral leads.  The patient received IV fluid bolus 300 mg of Plavix in the ED with improvement in her symptoms and was transferred to [facility] Cath Lab for emergency PCI.  PHYSICAL EXAMINATION:  VITAL SIGNS:  The blood pressure was 70/56, pulse 48. HEENT:  Conjunctivae were pink. NECK:  Supple.  No JVD.  No bruit. LUNGS:  Clear to auscultation. CARDIOVASCULAR:  S1, S2 was normal.  There was soft systolic murmur and S4 gallop. ABDOMEN:  Soft.  Bowel sounds are present.  Nontender. EXTREMITIES:  There is no clubbing, cyanosis, or edema.  LABORATORY DATA:  Sodium was 135, potassium 3.9, BUN 23, creatinine 0.75.  Her hemoglobin was 11.9, hematocrit 36.9, white count of 8. Troponin I first set was 1.20; repeat troponin I was 2.04, 1.74,  1.43, and 1.64.  Today, sodium is 134, potassium 3.7, BUN 16, creatinine 0.68. Hemoglobin is 11.9, hematocrit 33.6, white count of 7.5, cholesterol was 166, triglycerides 93, HDL 45, LDL 102.  Urinalysis showed many bacteria, positive for nitrites.  BRIEF HOSPITAL COURSE:  The patient was emergently brought to the cath lab at [facility] and subsequently underwent left cardiac cath with selective left and right coronary angiography as per procedure and LV graphy as per procedure report.  The patient was noted to have significant anterolateral, apical, and apical inferior wall severe hypokinesia with no significant obstructive coronary disease.  The patient did not have any further episodes of vasovagal hypotension or chest pain during the hospital stay.  Her cardiac enzymes were minimally elevated, which were trending down.  The patient is ambulating in the room without any problem.  Her groin is stable.  There were no significant arrhythmias on the monitor.  The patient will be discharged home on above medications and will be followed up in my office in 1 week.  We will up titrate her ACE inhibitors and beta blockers as blood pressure tolerates.     Eduardo Osier. Sharyn Lull, M.D.     [facility]  D:  09/14/2015  T:  09/14/2015  Job:  [id number]

## 2015-09-17 DIAGNOSIS — M25511 Pain in right shoulder: Secondary | ICD-10-CM | POA: Diagnosis not present

## 2015-09-21 ENCOUNTER — Encounter (HOSPITAL_COMMUNITY): Payer: Self-pay | Admitting: Emergency Medicine

## 2015-09-21 ENCOUNTER — Emergency Department (HOSPITAL_COMMUNITY): Payer: Medicare Other

## 2015-09-21 ENCOUNTER — Emergency Department (HOSPITAL_COMMUNITY)
Admission: EM | Admit: 2015-09-21 | Discharge: 2015-09-22 | Disposition: A | Discharge status: Home / Self Care | Payer: Medicare Other | Attending: Emergency Medicine | Admitting: Emergency Medicine

## 2015-09-21 DIAGNOSIS — R062 Wheezing: Secondary | ICD-10-CM | POA: Diagnosis not present

## 2015-09-21 DIAGNOSIS — R069 Unspecified abnormalities of breathing: Secondary | ICD-10-CM | POA: Diagnosis not present

## 2015-09-21 DIAGNOSIS — Z7982 Long term (current) use of aspirin: Secondary | ICD-10-CM | POA: Insufficient documentation

## 2015-09-21 DIAGNOSIS — E039 Hypothyroidism, unspecified: Secondary | ICD-10-CM | POA: Insufficient documentation

## 2015-09-21 DIAGNOSIS — E785 Hyperlipidemia, unspecified: Secondary | ICD-10-CM | POA: Insufficient documentation

## 2015-09-21 DIAGNOSIS — Z8679 Personal history of other diseases of the circulatory system: Secondary | ICD-10-CM | POA: Insufficient documentation

## 2015-09-21 DIAGNOSIS — J45909 Unspecified asthma, uncomplicated: Secondary | ICD-10-CM | POA: Insufficient documentation

## 2015-09-21 DIAGNOSIS — T18128A Food in esophagus causing other injury, initial encounter: Secondary | ICD-10-CM | POA: Insufficient documentation

## 2015-09-21 DIAGNOSIS — Z8739 Personal history of other diseases of the musculoskeletal system and connective tissue: Secondary | ICD-10-CM | POA: Insufficient documentation

## 2015-09-21 DIAGNOSIS — K222 Esophageal obstruction: Secondary | ICD-10-CM | POA: Diagnosis not present

## 2015-09-21 DIAGNOSIS — Y9389 Activity, other specified: Secondary | ICD-10-CM | POA: Insufficient documentation

## 2015-09-21 DIAGNOSIS — K219 Gastro-esophageal reflux disease without esophagitis: Secondary | ICD-10-CM | POA: Insufficient documentation

## 2015-09-21 DIAGNOSIS — R0602 Shortness of breath: Secondary | ICD-10-CM | POA: Diagnosis not present

## 2015-09-21 DIAGNOSIS — Z792 Long term (current) use of antibiotics: Secondary | ICD-10-CM | POA: Diagnosis not present

## 2015-09-21 DIAGNOSIS — Z7952 Long term (current) use of systemic steroids: Secondary | ICD-10-CM | POA: Insufficient documentation

## 2015-09-21 DIAGNOSIS — W228XXA Striking against or struck by other objects, initial encounter: Secondary | ICD-10-CM | POA: Insufficient documentation

## 2015-09-21 DIAGNOSIS — F419 Anxiety disorder, unspecified: Secondary | ICD-10-CM | POA: Insufficient documentation

## 2015-09-21 DIAGNOSIS — Z8669 Personal history of other diseases of the nervous system and sense organs: Secondary | ICD-10-CM | POA: Insufficient documentation

## 2015-09-21 DIAGNOSIS — Y998 Other external cause status: Secondary | ICD-10-CM | POA: Insufficient documentation

## 2015-09-21 DIAGNOSIS — Y9289 Other specified places as the place of occurrence of the external cause: Secondary | ICD-10-CM | POA: Diagnosis not present

## 2015-09-21 DIAGNOSIS — Z79899 Other long term (current) drug therapy: Secondary | ICD-10-CM | POA: Insufficient documentation

## 2015-09-21 DIAGNOSIS — R1310 Dysphagia, unspecified: Secondary | ICD-10-CM | POA: Diagnosis not present

## 2015-09-21 LAB — CBC
HCT: 33.7 % — ABNORMAL LOW (ref 36.0–46.0)
HEMOGLOBIN: 10.9 g/dL — AB (ref 12.0–15.0)
MCH: 30.1 pg (ref 26.0–34.0)
MCHC: 32.3 g/dL (ref 30.0–36.0)
MCV: 93.1 fL (ref 78.0–100.0)
Platelets: 376 10*3/uL (ref 150–400)
RBC: 3.62 MIL/uL — ABNORMAL LOW (ref 3.87–5.11)
RDW: 13.4 % (ref 11.5–15.5)
WBC: 10.3 10*3/uL (ref 4.0–10.5)

## 2015-09-21 LAB — BASIC METABOLIC PANEL
Anion gap: 14 (ref 5–15)
BUN: 22 mg/dL — ABNORMAL HIGH (ref 6–20)
CALCIUM: 9.4 mg/dL (ref 8.9–10.3)
CHLORIDE: 100 mmol/L — AB (ref 101–111)
CO2: 24 mmol/L (ref 22–32)
CREATININE: 0.91 mg/dL (ref 0.44–1.00)
GFR calc Af Amer: >60 mL/min (ref >60)
GFR calc non Af Amer: >60 mL/min (ref >60)
Glucose, Bld: 122 mg/dL — ABNORMAL HIGH (ref 65–99)
Potassium: 3.9 mmol/L (ref 3.5–5.1)
SODIUM: 138 mmol/L (ref 135–145)

## 2015-09-21 LAB — I-STAT TROPONIN, ED: TROPONIN I, POC: 0.02 ng/mL (ref 0.00–0.08)

## 2015-09-21 MED ORDER — LORAZEPAM 2 MG/ML IJ SOLN
1.0000 mg | Freq: Once | INTRAMUSCULAR | Status: AC
  Administered 2015-09-21: 1 mg via INTRAVENOUS
  Filled 2015-09-21: qty 1

## 2015-09-21 MED ORDER — IOHEXOL 300 MG/ML  SOLN
75.0000 mL | Freq: Once | INTRAMUSCULAR | Status: AC | PRN
  Administered 2015-09-21: 75 mL via INTRAVENOUS

## 2015-09-21 NOTE — Discharge Instructions (Signed)
Follow-up with [facility] gastroenterology. Their contact information has been provided in this discharge summary. Please call tomorrow to arrange this appointment.  Return to the emergency department if symptoms significantly worsen or change.   Esophageal Stricture Esophageal stricture is a condition that causes the esophagus to become narrow. The esophagus is the long tube in your throat that carries food and liquid from your mouth to your stomach. Esophageal stricture can make it difficult, painful, or even impossible to swallow. The condition also makes choking more likely.  CAUSES  Gastroesophageal reflux disease (GERD) is the most common cause of esophageal stricture. In GERD, stomach acid backs up into the esophagus. Over time, this causes scar tissue and leads to narrowing (stricture). Other causes of esophageal stricture include:  Scarring from ingesting a harmful substance.  Damage from medical instruments used in the esophagus.  Radiation therapy.  Cancer. RISK FACTORS You are at greater risk for esophageal stricture if you have GERD or esophageal cancer. SIGNS AND SYMPTOMS  Signs and symptoms of esophageal stricture include:  Difficulty swallowing.  Pain when swallowing.  Heartburn.  Vomiting or spitting up (regurgitating) food or liquids.  Weight loss.  DIAGNOSIS  Your health care provider may suspect esophageal stricture based on your symptoms. A physical exam will also be done. You may need tests to confirm the diagnosis. These can include:  Upper endoscopy. Your health care provider will insert a flexible tube with a tiny camera on it (endoscope) into your esophagus to check for a stricture. A tissue sample may also be taken to be examined under a microscope (biopsy).  Esophageal pH monitoring. This test involves collecting acid in the esophagus with a tube to determine how much stomach acid is entering the esophagus.  Barium swallow test. For this test, you will  drink a barium solution that coats the lining of the esophagus. Then you will have an X-ray taken. The barium solution helps to show if there is stricture. TREATMENT Treatment for esophageal stricture depends on what is causing your condition and how severe it is. Treatment options include:  Esophageal dilatation. In this procedure, a health care provider inserts an endoscope or a tool called a dilator into your esophagus to gently stretch it and make the opening wider.  Stents. In some cases, your health care provider may place a small device (stent) in the esophagus to keep it open.  Acid-blocking medicines. Taking these helps manage GERD symptoms after an esophageal stricture. This can prevent the stricture from returning. HOME CARE INSTRUCTIONS  Do not drink alcohol.  Do not use any tobacco products, including cigarettes, chewing tobacco, or electronic cigarettes. If you need help quitting, ask your health care provider.  Lose weight if you are overweight.  Wear loose, comfortable clothing.  Do not eat for 3 hours before bedtime.  Elevate your head in bed with pillows.  Do not overeat at meals.  Do not eat foods that make reflux worse. These include:  Fatty foods.  Spicy foods.  Soda.  Tomato products.  Chocolate. SEEK MEDICAL CARE IF:  You have problems eating or swallowing.  You regurgitate food and liquid. MAKE SURE YOU:  Understand these instructions.  Will watch your condition.  Will get help right away if you are not doing well or get worse.   This information is not intended to replace advice given to you by your health care provider. Make sure you discuss any questions you have with your health care provider.   Document Released: 03/15/2006  Document Revised: 07/26/2014 Document Reviewed: 11/14/2013 Elsevier Interactive Patient Education [employer].

## 2015-09-21 NOTE — ED Notes (Signed)
After Ativan administration, patient noted to be bradypneic. Pulse oximetry readings fell. Oxygen applied. MD Delo informed.

## 2015-09-21 NOTE — ED Provider Notes (Signed)
CSN: [id number]     Arrival date & time 09/21/15  1950 History   First MD Initiated Contact with Patient 09/21/15 2004     Chief Complaint  Patient presents with  . Shortness of Breath     (Consider location/radiation/quality/duration/timing/severity/associated sxs/prior Treatment) HPI Comments: Patient is a 70 year old female with past medical history of hypertension, asthma, and recent hospitalization for elevated troponin with nonobstructive coronary arteries. She presents today with complaints of difficulty breathing and swallowing. She reports that she was eating ravioli in tomato sauce when she swallowed and began feeling as though something was stuck. She was having difficulty breathing and this persists.  Patient is a 70 y.o. female presenting with shortness of breath. The history is provided by the patient.  Shortness of Breath Severity:  Moderate Onset quality:  Sudden Duration:  1 hour Timing:  Constant Progression:  Unchanged Chronicity:  New Context comment:  While eating Relieved by:  Nothing Worsened by:  Nothing tried Ineffective treatments:  None tried Associated symptoms: no cough, no fever and no hemoptysis     Past Medical History  Diagnosis Date  . GERD (gastroesophageal reflux disease)   . Hyperlipidemia   . Hypothyroidism   . Allergic rhinitis   . IBS (irritable bowel syndrome)   . Insomnia   . Asthmatic bronchitis 2009    allergies; post nasal drip but not allergies  . Torticollis 2006    Estimate date; rec'd botox and resolved  . Irregular heart beat Not sure; long time ago    Periodic irregular heart rate; take verapamil.   Past Surgical History  Procedure Laterality Date  . Abdominal hysterectomy    . Cataract extraction Right 2014    approx 2 years ago  . Tubal ligation Bilateral 1985  . Cardiac catheterization N/A 09/13/2015    Procedure: Left Heart Cath and Coronary Angiography;  Surgeon: Charolette Forward, MD;  Location: [facility];   Service: Cardiovascular;  Laterality: N/A;   Family History  Problem Relation Age of Onset  . Hypertension Other   . Hypertension Mother   . Heart disease Father   . Stroke Father   . Stroke Brother     brain aneurism   Social History  Substance Use Topics  . Smoking status: Never Smoker   . Smokeless tobacco: Never Used  . Alcohol Use: 0.6 - 1.2 oz/week    1-2 Shots of liquor per week     Comment: Perhaps every couple of weeks; not very frequently   OB History    No data available     Review of Systems  Constitutional: Negative for fever.  Respiratory: Positive for shortness of breath. Negative for cough and hemoptysis.   All other systems reviewed and are negative.     Allergies  Metoclopramide hcl and Vinegar  Home Medications   Prior to Admission medications   Medication Sig Start Date End Date Taking? Authorizing Provider  aspirin EC 81 MG EC tablet Take 1 tablet (81 mg total) by mouth daily. 09/14/15   Charolette Forward, MD  atorvastatin (LIPITOR) 40 MG tablet Take 1 tablet (40 mg total) by mouth daily at 6 PM. 09/14/15   Charolette Forward, MD  Cholecalciferol 1000 UNITS tablet Take 2,000 Units by mouth daily. 10/13/10   Aleksei [name] V, MD  Cinnamon 500 MG capsule Take 500 mg by mouth daily.    Historical Provider, MD  ciprofloxacin (CIPRO) 500 MG tablet Take 1 tablet (500 mg total) by mouth 2 (two) times  daily. 09/14/15   Charolette Forward, MD  fish oil-omega-3 fatty acids 1000 MG capsule Take 1 g by mouth daily.    Historical Provider, MD  levothyroxine (SYNTHROID, LEVOTHROID) 75 MCG tablet Take 1 tablet by mouth  daily Patient taking differently: Take 75 MCG by mouth  daily 03/11/15   Aleksei [name] V, MD  lisinopril (PRINIVIL,ZESTRIL) 5 MG tablet Take 1 tablet (5 mg total) by mouth daily. 09/14/15   Charolette Forward, MD  loratadine (CLARITIN) 10 MG tablet Take 10 mg by mouth daily.    Historical Provider, MD  metoprolol tartrate (LOPRESSOR) 25 MG tablet Take 0.5 tablets  (12.5 mg total) by mouth 2 (two) times daily. 09/14/15   Charolette Forward, MD  Multiple Vitamin (MULTIVITAMIN WITH MINERALS) TABS tablet Take 1 tablet by mouth daily.    Historical Provider, MD  nitroGLYCERIN (NITROSTAT) 0.4 MG SL tablet Place 1 tablet (0.4 mg total) under the tongue every 5 (five) minutes x 3 doses as needed for chest pain. 09/14/15   Charolette Forward, MD  omeprazole (PRILOSEC) 40 MG capsule Take 1 capsule by mouth  twice daily Patient taking differently: Take 40 mg by mouth 2 (two) times daily.  12/23/14   Aleksei [name] V, MD  triamcinolone ointment (KENALOG) 0.5 % Apply 1 application topically 2 (two) times daily. 02/28/15   Aleksei [name] V, MD   BP 113/82 mmHg  Pulse 93  Temp(Src) 98.1 F (36.7 C) (Oral)  Resp 17  SpO2 100% Physical Exam  Constitutional: She is oriented to person, place, and time. She appears well-developed and well-nourished. No distress.  She appears anxious.  HENT:  Head: Normocephalic and atraumatic.  Neck: Normal range of motion. Neck supple.  Cardiovascular: Normal rate and regular rhythm.  Exam reveals no gallop and no friction rub.   No murmur heard. Pulmonary/Chest: Effort normal and breath sounds normal. No stridor. No respiratory distress. She has no wheezes. She has no rales.  Abdominal: Soft. Bowel sounds are normal. She exhibits no distension. There is no tenderness.  Musculoskeletal: Normal range of motion. She exhibits no edema.  Lymphadenopathy:    She has no cervical adenopathy.  Neurological: She is alert and oriented to person, place, and time.  Skin: Skin is warm and dry. She is not diaphoretic.  Nursing note and vitals reviewed.   ED Course  Procedures (including critical care time) Labs Review Labs Reviewed  BASIC METABOLIC PANEL  CBC  I-STAT [place], ED  I-STAT TROPOININ, ED    Imaging Review No results found. I have personally reviewed and evaluated these images and lab results as part of my medical  decision-making.   EKG Interpretation   Date/Time:  Sunday September 21 2015 20:11:27 EST Ventricular Rate:  86 PR Interval:  143 QRS Duration: 92 QT Interval:  365 QTC Calculation: 436 R Axis:   141 Text Interpretation:  Sinus rhythm Right axis deviation Confirmed by Odis Turck   MD, Taralyn Ferraiolo ([id number]) on 09/21/2015 8:31:21 PM      MDM   Final diagnoses:  None    Patient is a 70 year old female who presents with complaints of esophageal obstruction which occurred while eating ravioli. She felt as though a piece of this became lodged in her esophagus and could not swallow. She states that this is happened in the past. A CT scan was obtained which reveals the suggestion of a distal esophageal obstruction. She was very anxious and was given Ativan with good results. She is now able to swallow water without difficulty. She  has had issues in the past with what sounds like an esophageal stricture. She has not seen her GI doctor in many years since he retired.   He is now able to handle a full glass of water with no retching or vomiting. She states that she feels that her stomach. I do not feel as though she is obstructed or occluded and is okay for discharge. She will be advised to take small bites and chew them thoroughly and adhere to a soft diet until followed up by gastroenterology.    Veryl Speak, MD 09/21/15 [phone]

## 2015-09-21 NOTE — ED Notes (Signed)
Pt from home by [facility] with SOB. Pt stated that it started while eating dinner. Pt has a history or having esophagus stretched. Pt feels like she cannot get something coughed up and denies choking. Per EMS pt has slight wheezing and gave a breathing treatment.

## 2015-09-21 NOTE — ED Notes (Signed)
Pt stable, ambulatory, states understanding of discharge instructions 

## 2015-09-22 ENCOUNTER — Encounter: Payer: Self-pay | Admitting: Gastroenterology

## 2015-09-24 DIAGNOSIS — I214 Non-ST elevation (NSTEMI) myocardial infarction: Secondary | ICD-10-CM | POA: Diagnosis not present

## 2015-09-24 DIAGNOSIS — E785 Hyperlipidemia, unspecified: Secondary | ICD-10-CM | POA: Diagnosis not present

## 2015-09-24 DIAGNOSIS — I10 Essential (primary) hypertension: Secondary | ICD-10-CM | POA: Diagnosis not present

## 2015-09-24 DIAGNOSIS — I2510 Atherosclerotic heart disease of native coronary artery without angina pectoris: Secondary | ICD-10-CM | POA: Diagnosis not present

## 2015-09-24 DIAGNOSIS — I5181 Takotsubo syndrome: Secondary | ICD-10-CM | POA: Diagnosis not present

## 2015-09-26 DIAGNOSIS — M25511 Pain in right shoulder: Secondary | ICD-10-CM | POA: Diagnosis not present

## 2015-10-07 DIAGNOSIS — H35033 Hypertensive retinopathy, bilateral: Secondary | ICD-10-CM | POA: Diagnosis not present

## 2015-10-07 DIAGNOSIS — H26491 Other secondary cataract, right eye: Secondary | ICD-10-CM | POA: Diagnosis not present

## 2015-10-09 DIAGNOSIS — I5181 Takotsubo syndrome: Secondary | ICD-10-CM | POA: Diagnosis not present

## 2015-10-09 DIAGNOSIS — I252 Old myocardial infarction: Secondary | ICD-10-CM | POA: Diagnosis not present

## 2015-10-09 DIAGNOSIS — I2510 Atherosclerotic heart disease of native coronary artery without angina pectoris: Secondary | ICD-10-CM | POA: Diagnosis not present

## 2015-10-09 DIAGNOSIS — I10 Essential (primary) hypertension: Secondary | ICD-10-CM | POA: Diagnosis not present

## 2015-10-10 DIAGNOSIS — M25511 Pain in right shoulder: Secondary | ICD-10-CM | POA: Diagnosis not present

## 2015-10-24 ENCOUNTER — Telehealth: Payer: Self-pay

## 2015-10-24 NOTE — Telephone Encounter (Signed)
Call to the patient to schedule AWV for this year; Was completed last year on 4/12;  Left VM to call back and schedule at her convenience after 4/12 or near to her apt with [name] in June

## 2015-11-03 ENCOUNTER — Encounter: Payer: Self-pay | Admitting: Internal Medicine

## 2015-11-03 ENCOUNTER — Ambulatory Visit (INDEPENDENT_AMBULATORY_CARE_PROVIDER_SITE_OTHER): Payer: Medicare Other | Admitting: Internal Medicine

## 2015-11-03 ENCOUNTER — Other Ambulatory Visit (INDEPENDENT_AMBULATORY_CARE_PROVIDER_SITE_OTHER): Payer: Medicare Other

## 2015-11-03 VITALS — BP 128/80 | HR 63 | Temp 97.8°F | Resp 16 | Ht 63.0 in | Wt 157.0 lb

## 2015-11-03 DIAGNOSIS — E038 Other specified hypothyroidism: Secondary | ICD-10-CM | POA: Diagnosis not present

## 2015-11-03 DIAGNOSIS — I10 Essential (primary) hypertension: Secondary | ICD-10-CM

## 2015-11-03 DIAGNOSIS — D539 Nutritional anemia, unspecified: Secondary | ICD-10-CM

## 2015-11-03 DIAGNOSIS — R739 Hyperglycemia, unspecified: Secondary | ICD-10-CM

## 2015-11-03 LAB — CBC WITH DIFFERENTIAL/PLATELET
BASOS ABS: 0.1 10*3/uL (ref 0.0–0.1)
BASOS PCT: 1.1 % (ref 0.0–3.0)
EOS ABS: 0.3 10*3/uL (ref 0.0–0.7)
Eosinophils Relative: 4.4 % (ref 0.0–5.0)
HEMATOCRIT: 38.1 % (ref 36.0–46.0)
Hemoglobin: 12.8 g/dL (ref 12.0–15.0)
LYMPHS ABS: 2.3 10*3/uL (ref 0.7–4.0)
Lymphocytes Relative: 32.5 % (ref 12.0–46.0)
MCHC: 33.5 g/dL (ref 30.0–36.0)
MCV: 89.9 fl (ref 78.0–100.0)
MONO ABS: 0.6 10*3/uL (ref 0.1–1.0)
Monocytes Relative: 8.5 % (ref 3.0–12.0)
NEUTROS ABS: 3.8 10*3/uL (ref 1.4–7.7)
NEUTROS PCT: 53.5 % (ref 43.0–77.0)
PLATELETS: 292 10*3/uL (ref 150.0–400.0)
RBC: 4.24 Mil/uL (ref 3.87–5.11)
RDW: 13.5 % (ref 11.5–15.5)
WBC: 7 10*3/uL (ref 4.0–10.5)

## 2015-11-03 LAB — BASIC METABOLIC PANEL
BUN: 33 mg/dL — AB (ref 6–23)
CHLORIDE: 100 meq/L (ref 96–112)
CO2: 27 meq/L (ref 19–32)
CREATININE: 0.95 mg/dL (ref 0.40–1.20)
Calcium: 9.4 mg/dL (ref 8.4–10.5)
GFR: 61.75 mL/min (ref >60.00)
GLUCOSE: 95 mg/dL (ref 70–99)
Potassium: 4.7 mEq/L (ref 3.5–5.1)
Sodium: 137 mEq/L (ref 135–145)

## 2015-11-03 LAB — VITAMIN B12: Vitamin B-12: 496 pg/mL (ref 211–911)

## 2015-11-03 LAB — FERRITIN: Ferritin: 49.5 ng/mL (ref 10.0–291.0)

## 2015-11-03 LAB — IBC PANEL
IRON: 103 ug/dL (ref 42–145)
SATURATION RATIOS: 28.6 % (ref 20.0–50.0)
TRANSFERRIN: 257 mg/dL (ref 212.0–360.0)

## 2015-11-03 LAB — HEMOGLOBIN A1C: Hgb A1c MFr Bld: 5.9 % (ref 4.6–6.5)

## 2015-11-03 LAB — FOLATE

## 2015-11-03 LAB — TSH: TSH: 5 u[IU]/mL — AB (ref 0.35–4.50)

## 2015-11-03 MED ORDER — LEVOTHYROXINE SODIUM 88 MCG PO TABS: 88.0000 ug | ORAL_TABLET | Freq: Every day | ORAL | Status: DC

## 2015-11-03 MED ORDER — VALSARTAN 160 MG PO TABS: 160.0000 mg | ORAL_TABLET | Freq: Every day | ORAL | Status: DC

## 2015-11-03 NOTE — Patient Instructions (Signed)

## 2015-11-03 NOTE — Progress Notes (Signed)
Subjective:  Patient ID: Cindy Simpson, female    DOB: 1945-10-30  Age: 70 y.o. MRN: [id number]  CC: Cough; Anemia; and Hypertension   HPI Cindy Simpson presents for a 31-month history of nonproductive cough. She is not a very good historian but it sounds like she saw her cardiologist about 2 months ago after having a cardiac event and was placed on ACE inhibitor. Since then she has had a mild, intermittent, nonproductive cough. She also complains of fatigue. She denies chest pain, hemoptysis, fever, chills, wheezing, shortness of breath, or dyspnea on exertion. Taking Robitussin-DM and it provides relief from the cough. She had a normal chest x-ray about a month ago. She is not a smoker.  Outpatient Prescriptions Prior to Visit  Medication Sig Dispense Refill  . aspirin EC 81 MG EC tablet Take 1 tablet (81 mg total) by mouth daily. 30 tablet 3  . atorvastatin (LIPITOR) 40 MG tablet Take 1 tablet (40 mg total) by mouth daily at 6 PM. 30 tablet 3  . Cholecalciferol 1000 UNITS tablet Take 2,000 Units by mouth daily.    . fish oil-omega-3 fatty acids 1000 MG capsule Take 1 g by mouth daily.    Marland Kitchen levothyroxine (SYNTHROID, LEVOTHROID) 75 MCG tablet Take 1 tablet by mouth  daily (Patient taking differently: Take 75 MCG by mouth  daily) 90 tablet 3  . loratadine (CLARITIN) 10 MG tablet Take 10 mg by mouth daily.    . metoprolol tartrate (LOPRESSOR) 25 MG tablet Take 0.5 tablets (12.5 mg total) by mouth 2 (two) times daily. 30 tablet 3  . Multiple Vitamin (MULTIVITAMIN WITH MINERALS) TABS tablet Take 1 tablet by mouth daily.    . nitroGLYCERIN (NITROSTAT) 0.4 MG SL tablet Place 1 tablet (0.4 mg total) under the tongue every 5 (five) minutes x 3 doses as needed for chest pain. 25 tablet 12  . omeprazole (PRILOSEC) 40 MG capsule Take 1 capsule by mouth  twice daily (Patient taking differently: Take 40 mg by mouth 2 (two) times daily. ) 180 capsule 3  . lisinopril (PRINIVIL,ZESTRIL) 5 MG tablet Take  1 tablet (5 mg total) by mouth daily. 30 tablet 3  . Cinnamon 500 MG capsule Take 500 mg by mouth daily.    . ciprofloxacin (CIPRO) 500 MG tablet Take 1 tablet (500 mg total) by mouth 2 (two) times daily. 14 tablet 0   No facility-administered medications prior to visit.    ROS Review of Systems  Constitutional: Positive for fatigue. Negative for fever, chills, diaphoresis, activity change, appetite change and unexpected weight change.  HENT: Negative.  Negative for congestion, facial swelling, sinus pressure, sneezing, sore throat, tinnitus and trouble swallowing.   Eyes: Negative.   Respiratory: Positive for cough. Negative for choking, chest tightness, shortness of breath, wheezing and stridor.   Cardiovascular: Negative.  Negative for chest pain, palpitations and leg swelling.  Gastrointestinal: Negative.  Negative for nausea, vomiting, abdominal pain, diarrhea, constipation and blood in stool.  Endocrine: Negative.   Genitourinary: Negative.   Musculoskeletal: Negative.  Negative for myalgias, back pain, arthralgias and neck pain.  Skin: Negative.   Allergic/Immunologic: Negative.   Neurological: Negative.  Negative for dizziness, tremors, syncope and headaches.  Hematological: Negative.  Negative for adenopathy. Does not bruise/bleed easily.  Psychiatric/Behavioral: Negative.     Objective:  BP 128/80 mmHg  Pulse 63  Temp(Src) 97.8 F (36.6 C) (Oral)  Resp 16  Ht 5\' 3"  (1.6 m)  Wt 157 lb (71.215 kg)  BMI 27.82 kg/m2  SpO2 95%  BP Readings from Last 3 Encounters:  11/03/15 128/80  09/21/15 96/63  09/14/15 92/61    Wt Readings from Last 3 Encounters:  11/03/15 157 lb (71.215 kg)  09/14/15 158 lb 3.2 oz (71.759 kg)  09/02/15 160 lb (72.576 kg)    Physical Exam  Constitutional: She is oriented to person, place, and time. She appears well-developed and well-nourished. No distress.  HENT:  Head: Normocephalic.  Mouth/Throat: Oropharynx is clear and moist. No  oropharyngeal exudate.  Eyes: Conjunctivae are normal. Right eye exhibits no discharge. Left eye exhibits no discharge. No scleral icterus.  Neck: Normal range of motion. Neck supple. No JVD present. No tracheal deviation present. No thyromegaly present.  Cardiovascular: Normal rate, regular rhythm, normal heart sounds and intact distal pulses.  Exam reveals no gallop and no friction rub.   No murmur heard. Pulmonary/Chest: Effort normal and breath sounds normal. No stridor. No respiratory distress. She has no wheezes. She has no rales. She exhibits no tenderness.  Abdominal: Soft. Bowel sounds are normal. She exhibits no distension and no mass. There is no tenderness. There is no rebound and no guarding.  Musculoskeletal: Normal range of motion. She exhibits no edema or tenderness.  Lymphadenopathy:    She has no cervical adenopathy.  Neurological: She is oriented to person, place, and time.  Skin: Skin is warm and dry. No rash noted. She is not diaphoretic. No erythema. No pallor.  Vitals reviewed.   Lab Results  Component Value Date   WBC 10.3 09/21/2015   HGB 10.9* 09/21/2015   HCT 33.7* 09/21/2015   PLT 376 09/21/2015   GLUCOSE 122* 09/21/2015   CHOL 166 09/14/2015   TRIG 93 09/14/2015   HDL 45 09/14/2015   LDLDIRECT 137.5 03/17/2009   LDLCALC 102* 09/14/2015   ALT 20 09/13/2015   AST 32 09/13/2015   NA 138 09/21/2015   K 3.9 09/21/2015   CL 100* 09/21/2015   CREATININE 0.91 09/21/2015   BUN 22* 09/21/2015   CO2 24 09/21/2015   TSH 3.49 02/28/2015   INR 1.20 09/13/2015    Dg Chest 2 View  09/21/2015  CLINICAL DATA:  Severe chest pain and shortness of breath. EXAM: CHEST  2 VIEW COMPARISON:  09/13/2015 FINDINGS: The heart size and mediastinal contours are within normal limits. Both lungs are clear. The visualized skeletal structures are unremarkable. Large esophageal hiatal hernia behind the heart. No change since previous study. IMPRESSION: No active cardiopulmonary  disease. Electronically Signed   By: Burman Nieves M.D.   On: 09/21/2015 22:26   Ct Soft Tissue Neck W Contrast  09/21/2015  CLINICAL DATA:  Initial evaluation for difficulty swelling. Possible obstruction. EXAM: CT NECK WITH CONTRAST TECHNIQUE: Multidetector CT imaging of the neck was performed using the standard protocol following the bolus administration of intravenous contrast. CONTRAST:  75mL OMNIPAQUE IOHEXOL 300 MG/ML  SOLN COMPARISON:  None available. FINDINGS: Cerebral atrophy noted within the partially visualized brain. Partially visualized portions of the brain demonstrate no acute abnormality. Globes and orbits demonstrate no acute abnormality. Sequela prior lens extraction noted bilaterally. Mild mucosal thickening noted within the maxillary sinuses bilaterally, left greater than right. Air-fluid level present within the left maxillary sinus. Visualized paranasal sinuses are otherwise largely clear. No mastoid effusion. Inner ears cavities are clear. Salivary glands including the parotid glands and submandibular glands are normal. Oral cavity demonstrates no acute abnormality. No acute abnormality about the dentition. [place]atine tonsils within normal limits. Parapharyngeal fat  preserved. Nasopharynx and oropharynx within normal limits. Epiglottis normal. Vallecula clear. No retropharyngeal fluid collection. No significant mucosal edema within the hypopharynx is supraglottic larynx. No obstructing mass lesion. Piriform sinuses clear. True cords symmetric and within normal limits. Subglottic airway is clear. Thyroid gland within normal limits. No pathologically enlarged lymph nodes identified within the neck. Intraluminal fluid density noted within the partially visualized upper esophagus at the level of the upper mediastinum (series 2, image 96). Partially visualized upper mediastinum otherwise within normal limits. Partially visualized lungs are grossly clear without acute abnormality. Normal  intravascular enhancement seen throughout the neck. Mild medialization of the left common carotid artery into the retropharyngeal space. Mild scattered calcified plaque about the right carotid bifurcation without significant stenosis. Reversal of the normal cervical lordosis with apex at C5-6. No acute osseous abnormality. Bulky anterior osteophytic spurring at C5-6 and C6-7. No worrisome lytic or blastic osseous lesions. IMPRESSION: 1. Intraluminal fluid density with air-fluid level within the partially visualized upper esophagus at the level of the superior mediastinum. Finding raises the possibility for a possible distal obstructive process, not seen on this exam. Visualized airway and esophagus superior to this level within the neck are clear without obstructive process or acute abnormality. 2. Otherwise negative CT of the neck. 3. 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 4. Mild bilateral maxillary sinus disease as above, left greater than right. Electronically Signed   By: Rise Mu M.D.   On: 09/21/2015 22:49    Assessment & Plan:   Cindy was seen today for cough, anemia and hypertension.  Diagnoses and all orders for this visit:  Essential hypertension- I think she has a chronic cough related to the ACE inhibitor so will change her to an ARB. Her renal function electrolytes are stable today. -     Basic metabolic panel; Future -     valsartan (DIOVAN) 160 MG tablet; Take 1 tablet (160 mg total) by mouth daily.  Other specified hypothyroidism- her TSH is slightly elevated and she complains of fatigue so I have made a slight increase in her Synthroid dose -     TSH; Future  Deficiency anemia- her hemoglobin and hematocrit are normal now and her vitamin levels are all within normal range. -     CBC with Differential/Platelet; Future -     IBC panel; Future -     Vitamin B12; Future -     Ferritin; Future -      Folate; Future  Hyperglycemia- she is prediabetic, no medications are needed at this time. -     Basic metabolic panel; Future -     Hemoglobin A1c; Future  I have discontinued Ms. Simpson's Cinnamon, ciprofloxacin, and lisinopril. I am also having her start on valsartan. Additionally, I am having her maintain her fish oil-omega-3 fatty acids, Cholecalciferol, loratadine, omeprazole, levothyroxine, multivitamin with minerals, aspirin, atorvastatin, metoprolol tartrate, and nitroGLYCERIN.  Meds ordered this encounter  Medications  . valsartan (DIOVAN) 160 MG tablet    Sig: Take 1 tablet (160 mg total) by mouth daily.    Dispense:  90 tablet    Refill:  1     Follow-up: Return in about 4 weeks (around 12/01/2015).  Sanda Linger, MD

## 2015-11-03 NOTE — Progress Notes (Signed)
Pre visit review using our clinic review tool, if applicable. No additional management support is needed unless otherwise documented below in the visit note. 

## 2015-11-05 DIAGNOSIS — M24811 Other specific joint derangements of right shoulder, not elsewhere classified: Secondary | ICD-10-CM | POA: Diagnosis not present

## 2015-11-07 ENCOUNTER — Encounter: Payer: Self-pay | Admitting: Gastroenterology

## 2015-11-07 ENCOUNTER — Ambulatory Visit (INDEPENDENT_AMBULATORY_CARE_PROVIDER_SITE_OTHER): Payer: Medicare Other | Admitting: Gastroenterology

## 2015-11-07 VITALS — BP 92/60 | HR 72 | Ht 60.75 in | Wt 157.5 lb

## 2015-11-07 DIAGNOSIS — R1310 Dysphagia, unspecified: Secondary | ICD-10-CM

## 2015-11-07 DIAGNOSIS — K449 Diaphragmatic hernia without obstruction or gangrene: Secondary | ICD-10-CM | POA: Diagnosis not present

## 2015-11-07 DIAGNOSIS — K219 Gastro-esophageal reflux disease without esophagitis: Secondary | ICD-10-CM

## 2015-11-07 NOTE — Patient Instructions (Signed)
You have been scheduled for a Barium Esophogram at [facility] (1st floor of the hospital) on 11-12-2015 at 11:30am. Please arrive 15 minutes prior to your appointment for registration. Make certain not to have anything to eat or drink 3 hours prior to your test. If you need to reschedule for any reason, please contact radiology at [phone] to do so. __________________________________________________________________ A barium swallow is an examination that concentrates on views of the esophagus. This tends to be a double contrast exam (barium and two liquids which, when combined, create a gas to distend the wall of the oesophagus) or single contrast (non-ionic iodine based). The study is usually tailored to your symptoms so a good history is essential. Attention is paid during the study to the form, structure and configuration of the esophagus, looking for functional disorders (such as aspiration, dysphagia, achalasia, motility and reflux) EXAMINATION You may be asked to change into a gown, depending on the type of swallow being performed. A radiologist and radiographer will perform the procedure. The radiologist will advise you of the type of contrast selected for your procedure and direct you during the exam. You will be asked to stand, sit or lie in several different positions and to hold a small amount of fluid in your mouth before being asked to swallow while the imaging is performed .In some instances you may be asked to swallow barium coated marshmallows to assess the motility of a solid food bolus. The exam can be recorded as a digital or video fluoroscopy procedure. POST PROCEDURE It will take 1-2 days for the barium to pass through your system. To facilitate this, it is important, unless otherwise directed, to increase your fluids for the next 24-48hrs and to resume your normal diet.  This test typically takes about 30 minutes to perform.   Thank you for choosing [facility]  Dr  Wilfrid Lund III

## 2015-11-07 NOTE — Progress Notes (Signed)
[facility] Consult Note:  History: Cindy Simpson 11/07/2015  Referring physician: Walker Kehr, MD  Reason for consult/chief complaint: Gastroesophageal Reflux; Cough; and Dysphagia   Subjective HPI:  Cindy Simpson sees me today for dysphagia. She is a somewhat limited historian, but I was able to gather much more history from review of computer records. She saw Dr. Sharlett Iles in 2006 for dysphagia, and an EGD found a large hiatal hernia. A 56 French bougie dilation was also performed, she cannot recall if it helped the dysphagia. She is here now for slowly worsening symptoms of regurgitation and pyrosis as well as solid food dysphagia. She appears to been in the ED on 09/21/2015 for a food impaction. CT scan of the neck done out of concern for foreign body in or near the airway showed some fluid in the proximal esophagus, , but the remainder of the esophagus could not be seen on that study. The EGD providers note indicates that the impaction must have passed, as the patient finally felt relief and was able to drink liquids. She is having daytime and nighttime regurgitation and pyrosis with nocturnal symptoms as well. In addition, the patient was admitted in February 2017 with a NSTEMI, with EKG changes and elevated troponin. However, she underwent a cardiac catheterization that showed no significant coronary disease, but definite regional hypokinesis. It was felt she might have Takasubo cardiomyopathy. She reports having followed up with the cardiologist since then, but I'm afraid we do not have those records as they are not in our Epic system. She has been having a chronic cough for at least a year, her PCP recently thought it might be due to her ACE inhibitor. It was just changed to an ARB a few days ago, and the cough persists. She denies weight loss despite the intermittent dysphagia ROS:  Review of Systems  Constitutional: Negative for appetite change and unexpected weight change.   HENT: Negative for mouth sores and voice change.   Eyes: Negative for pain and redness.  Respiratory: Positive for cough. Negative for shortness of breath.   Cardiovascular: Negative for chest pain and palpitations.  Genitourinary: Negative for dysuria and hematuria.  Musculoskeletal: Positive for arthralgias. Negative for myalgias.  Skin: Negative for pallor and rash.  Neurological: Negative for weakness and headaches.  Hematological: Negative for adenopathy.     Past Medical History: Past Medical History  Diagnosis Date  . GERD (gastroesophageal reflux disease)   . Hyperlipidemia   . Hypothyroidism   . Allergic rhinitis   . IBS (irritable bowel syndrome)   . Insomnia   . Asthmatic bronchitis 2009    allergies; post nasal drip but not allergies  . Torticollis 2006    Estimate date; rec'd botox and resolved  . Irregular heart beat Not sure; long time ago    Periodic irregular heart rate; take verapamil.  . Anemia   . Status post dilation of esophageal narrowing      Past Surgical History: Past Surgical History  Procedure Laterality Date  . Abdominal hysterectomy    . Cataract extraction Right 2014    approx 2 years ago  . Tubal ligation Bilateral 1985  . Cardiac catheterization N/A 09/13/2015    Procedure: Left Heart Cath and Coronary Angiography;  Surgeon: Charolette Forward, MD;  Location: [facility];  Service: Cardiovascular;  Laterality: N/A;  . Cleft palate repair       Family History: Family History  Problem Relation Age of Onset  . Hypertension Mother   .  Heart disease Father   . Stroke Father   . Stroke Brother     brain aneurism  . Colon polyps Sister   . Other Sister     c diff-deceased    Social History: Social History   Social History  . Marital Status: Married    Spouse Name: N/A  . Number of Children: 0  . Years of Education: N/A   Occupational History  . Retired    Social History Main Topics  . Smoking status: Never Smoker   .  Smokeless tobacco: Never Used  . Alcohol Use: 0.6 - 1.2 oz/week    1-2 Shots of liquor per week     Comment: Perhaps every couple of weeks; not very frequently  . Drug Use: No  . Sexual Activity: No   Other Topics Concern  . None   Social History Narrative    Allergies: Allergies  Allergen Reactions  . Lisinopril Cough  . Metoclopramide Hcl Other (See Comments)    Neck spasms  . Reglan [Metoclopramide]   . Vinegar [Acetic Acid] Swelling    Throat closes trouble breathing with vinegar.    Outpatient Meds: Current Outpatient Prescriptions  Medication Sig Dispense Refill  . aspirin EC 81 MG EC tablet Take 1 tablet (81 mg total) by mouth daily. 30 tablet 3  . atorvastatin (LIPITOR) 40 MG tablet Take 1 tablet (40 mg total) by mouth daily at 6 PM. 30 tablet 3  . Cholecalciferol 1000 UNITS tablet Take 2,000 Units by mouth daily.    . fish oil-omega-3 fatty acids 1000 MG capsule Take 1 g by mouth daily.    Marland Kitchen levothyroxine (SYNTHROID, LEVOTHROID) 88 MCG tablet Take 1 tablet (88 mcg total) by mouth daily. 90 tablet 1  . loratadine (CLARITIN) 10 MG tablet Take 10 mg by mouth daily.    . metoprolol tartrate (LOPRESSOR) 25 MG tablet Take 0.5 tablets (12.5 mg total) by mouth 2 (two) times daily. 30 tablet 3  . Multiple Vitamin (MULTIVITAMIN WITH MINERALS) TABS tablet Take 1 tablet by mouth daily.    . nitroGLYCERIN (NITROSTAT) 0.4 MG SL tablet Place 1 tablet (0.4 mg total) under the tongue every 5 (five) minutes x 3 doses as needed for chest pain. 25 tablet 12  . omeprazole (PRILOSEC) 40 MG capsule Take 1 capsule by mouth  twice daily (Patient taking differently: Take 40 mg by mouth 2 (two) times daily. ) 180 capsule 3  . valsartan (DIOVAN) 160 MG tablet Take 1 tablet (160 mg total) by mouth daily. 90 tablet 1   No current facility-administered medications for this visit.      ___________________________________________________________________ Objective  Exam:  BP 92/60 mmHg   Pulse 72  Ht 5' 0.75" (1.543 m)  Wt 157 lb 8 oz (71.442 kg)  BMI 30.01 kg/m2   General: this is a(n) Well-appearing elderly woman with good muscle mass, mildly dysarthric   Eyes: sclera anicteric, no redness  ENT: oral mucosa moist without lesions, no cervical or supraclavicular lymphadenopathy, good dentition  CV: RRR with a soft systolic murmur, [id number], no JVD, no peripheral edema  Resp: clear to auscultation bilaterally, normal RR and effort noted  GI: soft, no tenderness, with active bowel sounds. No guarding or palpable organomegaly noted.  Skin; warm and dry, no rash or jaundice noted Neuro: awake, alert and oriented x 3. Normal gross motor function and fluent speech   Data imaging and cardiac cath and laboratory results as noted above.  Assessment: Encounter Diagnoses  Name  Primary?  . Gastroesophageal reflux disease, esophagitis presence not specified Yes  . Hiatal hernia   . Dysphagia     I suspect her hiatal hernia has worsened over time, leading to dysphagia either due to anatomical distortion or perhaps stricture or both.  Plan:  Barium swallow Upper endoscopy to follow, but we will need clearance from her cardiologist given the recent events and her persistently low blood pressure.  Thank you for the courtesy of this consult.  Please call me with any questions or concerns.  [name]

## 2015-11-10 ENCOUNTER — Telehealth: Payer: Self-pay | Admitting: Gastroenterology

## 2015-11-10 NOTE — Telephone Encounter (Signed)
Office note has been faxed to Dr Terrence Dupont

## 2015-11-10 NOTE — Telephone Encounter (Signed)
Please send my 11/07/15 office consult note to her cardiologist, Dr. Terrence Dupont, and request cardiac evaluation/clearance for an EGD that she needs to have.  She reports seeing him since her Feb 2017 admission for MI, but we do not have notes in Epic. Anesthesia will want to know the status of her cardiac condition to plan sedation for EGD.  May need to be done at [facility]

## 2015-11-12 ENCOUNTER — Ambulatory Visit (HOSPITAL_COMMUNITY)
Admission: RE | Admit: 2015-11-12 | Discharge: 2015-11-12 | Disposition: A | Discharge status: Home / Self Care | Payer: Medicare Other | Source: Ambulatory Visit | Attending: Gastroenterology | Admitting: Gastroenterology

## 2015-11-12 DIAGNOSIS — K219 Gastro-esophageal reflux disease without esophagitis: Secondary | ICD-10-CM

## 2015-11-12 DIAGNOSIS — K449 Diaphragmatic hernia without obstruction or gangrene: Secondary | ICD-10-CM | POA: Diagnosis not present

## 2015-11-12 DIAGNOSIS — K224 Dyskinesia of esophagus: Secondary | ICD-10-CM | POA: Insufficient documentation

## 2015-11-12 DIAGNOSIS — R1310 Dysphagia, unspecified: Secondary | ICD-10-CM

## 2015-11-20 ENCOUNTER — Other Ambulatory Visit: Payer: Self-pay

## 2015-11-20 DIAGNOSIS — K449 Diaphragmatic hernia without obstruction or gangrene: Secondary | ICD-10-CM

## 2015-12-01 ENCOUNTER — Encounter: Payer: Self-pay | Admitting: Internal Medicine

## 2015-12-01 ENCOUNTER — Ambulatory Visit (INDEPENDENT_AMBULATORY_CARE_PROVIDER_SITE_OTHER): Payer: Medicare Other | Admitting: Internal Medicine

## 2015-12-01 VITALS — BP 130/70 | HR 64 | Wt 160.0 lb

## 2015-12-01 DIAGNOSIS — K219 Gastro-esophageal reflux disease without esophagitis: Secondary | ICD-10-CM

## 2015-12-01 DIAGNOSIS — J4520 Mild intermittent asthma, uncomplicated: Secondary | ICD-10-CM

## 2015-12-01 DIAGNOSIS — I10 Essential (primary) hypertension: Secondary | ICD-10-CM | POA: Diagnosis not present

## 2015-12-01 MED ORDER — VALSARTAN 160 MG PO TABS: 160.0000 mg | ORAL_TABLET | Freq: Every day | ORAL | Status: DC

## 2015-12-01 MED ORDER — PROMETHAZINE-CODEINE 6.25-10 MG/5ML PO SYRP: 5.0000 mL | ORAL_SOLUTION | ORAL | Status: DC | PRN

## 2015-12-01 MED ORDER — RABEPRAZOLE SODIUM 20 MG PO TBEC: 20.0000 mg | DELAYED_RELEASE_TABLET | Freq: Two times a day (BID) | ORAL | Status: DC

## 2015-12-01 MED ORDER — METOPROLOL TARTRATE 25 MG PO TABS: 12.5000 mg | ORAL_TABLET | Freq: Two times a day (BID) | ORAL | Status: DC

## 2015-12-01 NOTE — Assessment & Plan Note (Signed)
On metoprolol, Diovan

## 2015-12-01 NOTE — Progress Notes (Signed)
Pre visit review using our clinic review tool, if applicable. No additional management support is needed unless otherwise documented below in the visit note. 

## 2015-12-01 NOTE — Assessment & Plan Note (Addendum)
5/17 cough is better off ACE Change GERD Rx to Aciphex (Prilosec is not helping). F/u w/Dr Loletha Carrow. EGD pending Prom-cod prn Consider starting Anoro if not better

## 2015-12-01 NOTE — Assessment & Plan Note (Addendum)
Change GERD Rx to Aciphex (Prilosec is not helping). F/u w/Dr Loletha Carrow. EGD pending

## 2015-12-01 NOTE — Progress Notes (Signed)
Subjective:  Patient ID: Cindy Simpson, female    DOB: 1946/03/26  Age: 70 y.o. MRN: [id number]  CC: No chief complaint on file.   HPI Cindy Simpson presents for HTN, hypothyroidism C/o cough - getting better however. C/o GERD  Outpatient Prescriptions Prior to Visit  Medication Sig Dispense Refill  . aspirin EC 81 MG EC tablet Take 1 tablet (81 mg total) by mouth daily. 30 tablet 3  . atorvastatin (LIPITOR) 40 MG tablet Take 1 tablet (40 mg total) by mouth daily at 6 PM. 30 tablet 3  . Cholecalciferol 1000 UNITS tablet Take 2,000 Units by mouth daily.    . fish oil-omega-3 fatty acids 1000 MG capsule Take 1 g by mouth daily.    Marland Kitchen levothyroxine (SYNTHROID, LEVOTHROID) 88 MCG tablet Take 1 tablet (88 mcg total) by mouth daily. 90 tablet 1  . loratadine (CLARITIN) 10 MG tablet Take 10 mg by mouth daily.    . metoprolol tartrate (LOPRESSOR) 25 MG tablet Take 0.5 tablets (12.5 mg total) by mouth 2 (two) times daily. 30 tablet 3  . Multiple Vitamin (MULTIVITAMIN WITH MINERALS) TABS tablet Take 1 tablet by mouth daily.    Marland Kitchen omeprazole (PRILOSEC) 40 MG capsule Take 1 capsule by mouth  twice daily (Patient taking differently: Take 40 mg by mouth 2 (two) times daily. ) 180 capsule 3  . valsartan (DIOVAN) 160 MG tablet Take 1 tablet (160 mg total) by mouth daily. 90 tablet 1  . nitroGLYCERIN (NITROSTAT) 0.4 MG SL tablet Place 1 tablet (0.4 mg total) under the tongue every 5 (five) minutes x 3 doses as needed for chest pain. (Patient not taking: Reported on 12/01/2015) 25 tablet 12   No facility-administered medications prior to visit.    ROS Review of Systems  Constitutional: Negative for chills, activity change, appetite change, fatigue and unexpected weight change.  HENT: Negative for congestion, mouth sores and sinus pressure.   Eyes: Negative for visual disturbance.  Respiratory: Positive for cough. Negative for chest tightness.   Gastrointestinal: Negative for nausea and abdominal  pain.  Genitourinary: Negative for frequency, difficulty urinating and vaginal pain.  Musculoskeletal: Negative for back pain and gait problem.  Skin: Negative for pallor and rash.  Neurological: Negative for dizziness, tremors, weakness, numbness and headaches.  Psychiatric/Behavioral: Negative for confusion and sleep disturbance.    Objective:  BP 130/70 mmHg  Pulse 64  Wt 160 lb (72.576 kg)  SpO2 97%  BP Readings from Last 3 Encounters:  12/01/15 130/70  11/07/15 92/60  11/03/15 128/80    Wt Readings from Last 3 Encounters:  12/01/15 160 lb (72.576 kg)  11/07/15 157 lb 8 oz (71.442 kg)  11/03/15 157 lb (71.215 kg)    Physical Exam  Constitutional: She appears well-developed. No distress.  HENT:  Head: Normocephalic.  Right Ear: External ear normal.  Left Ear: External ear normal.  Nose: Nose normal.  Mouth/Throat: Oropharynx is clear and moist.  Eyes: Conjunctivae are normal. Pupils are equal, round, and reactive to light. Right eye exhibits no discharge. Left eye exhibits no discharge.  Neck: Normal range of motion. Neck supple. No JVD present. No tracheal deviation present. No thyromegaly present.  Cardiovascular: Normal rate, regular rhythm and normal heart sounds.   Pulmonary/Chest: No stridor. No respiratory distress. She has no wheezes.  Abdominal: Soft. Bowel sounds are normal. She exhibits no distension and no mass. There is no tenderness. There is no rebound and no guarding.  Musculoskeletal: She exhibits no edema  or tenderness.  Lymphadenopathy:    She has no cervical adenopathy.  Neurological: She displays normal reflexes. No cranial nerve deficit. She exhibits normal muscle tone. Coordination normal.  Skin: No rash noted. No erythema.  Psychiatric: She has a normal mood and affect. Her behavior is normal. Judgment and thought content normal.  Obese  Lab Results  Component Value Date   WBC 7.0 11/03/2015   HGB 12.8 11/03/2015   HCT 38.1 11/03/2015    PLT 292.0 11/03/2015   GLUCOSE 95 11/03/2015   CHOL 166 09/14/2015   TRIG 93 09/14/2015   HDL 45 09/14/2015   LDLDIRECT 137.5 03/17/2009   LDLCALC 102* 09/14/2015   ALT 20 09/13/2015   AST 32 09/13/2015   NA 137 11/03/2015   K 4.7 11/03/2015   CL 100 11/03/2015   CREATININE 0.95 11/03/2015   BUN 33* 11/03/2015   CO2 27 11/03/2015   TSH 5.00* 11/03/2015   INR 1.20 09/13/2015   HGBA1C 5.9 11/03/2015    Dg Esophagus  11/12/2015  CLINICAL DATA:  Dysphagia and regurgitation. EXAM: ESOPHOGRAM/BARIUM SWALLOW TECHNIQUE: Combined double contrast and single contrast examination performed using effervescent crystals, thick barium liquid, and thin barium liquid. FLUOROSCOPY TIME:  Radiation Exposure Index (as provided by the fluoroscopic device): 6.9 If the device does not provide the exposure index: Fluoroscopy Time:  1 minutes 12 seconds Number of Acquired Images: COMPARISON:  1/26/6 FINDINGS: There is a very large hiatal hernia. At least 50% intra thoracic stomach is present. The esophagus is mildly patulous. The motility of the esophagus is mildly disorganized. Multiple episodes of moderate gastroesophageal reflux noted. A 13 mm barium tab was ingested which easily passed through the esophagus into the stomach IMPRESSION: 1. Very large hiatal hernia, increased from previous exam 2. Reflux. 3. Mild esophageal dysmotility. 4. No stricture mass identified. Electronically Signed   By: Signa Kell M.D.   On: 11/12/2015 13:24    Assessment & Plan:   Diagnoses and all orders for this visit:  Essential hypertension -     valsartan (DIOVAN) 160 MG tablet; Take 1 tablet (160 mg total) by mouth daily.  Other orders -     metoprolol tartrate (LOPRESSOR) 25 MG tablet; Take 0.5 tablets (12.5 mg total) by mouth 2 (two) times daily.   I am having Ms. Knaggs maintain her fish oil-omega-3 fatty acids, Cholecalciferol, loratadine, omeprazole, multivitamin with minerals, aspirin, atorvastatin, metoprolol  tartrate, nitroGLYCERIN, valsartan, and levothyroxine.  No orders of the defined types were placed in this encounter.     Follow-up: No Follow-up on file.  [name], MD

## 2015-12-03 DIAGNOSIS — M24811 Other specific joint derangements of right shoulder, not elsewhere classified: Secondary | ICD-10-CM | POA: Diagnosis not present

## 2015-12-07 DIAGNOSIS — T17200A Unspecified foreign body in pharynx causing asphyxiation, initial encounter: Secondary | ICD-10-CM | POA: Diagnosis not present

## 2015-12-07 DIAGNOSIS — R079 Chest pain, unspecified: Secondary | ICD-10-CM | POA: Diagnosis not present

## 2015-12-07 DIAGNOSIS — Z743 Need for continuous supervision: Secondary | ICD-10-CM | POA: Diagnosis not present

## 2015-12-07 DIAGNOSIS — R0682 Tachypnea, not elsewhere classified: Secondary | ICD-10-CM | POA: Diagnosis not present

## 2015-12-07 DIAGNOSIS — R061 Stridor: Secondary | ICD-10-CM | POA: Diagnosis not present

## 2015-12-07 DIAGNOSIS — R0602 Shortness of breath: Secondary | ICD-10-CM | POA: Diagnosis not present

## 2015-12-07 DIAGNOSIS — T18128A Food in esophagus causing other injury, initial encounter: Secondary | ICD-10-CM | POA: Diagnosis not present

## 2015-12-07 DIAGNOSIS — J029 Acute pharyngitis, unspecified: Secondary | ICD-10-CM | POA: Diagnosis not present

## 2015-12-07 DIAGNOSIS — Z7982 Long term (current) use of aspirin: Secondary | ICD-10-CM | POA: Diagnosis not present

## 2015-12-07 DIAGNOSIS — K449 Diaphragmatic hernia without obstruction or gangrene: Secondary | ICD-10-CM | POA: Diagnosis not present

## 2015-12-07 DIAGNOSIS — I252 Old myocardial infarction: Secondary | ICD-10-CM | POA: Diagnosis not present

## 2015-12-14 DIAGNOSIS — K449 Diaphragmatic hernia without obstruction or gangrene: Secondary | ICD-10-CM | POA: Diagnosis not present

## 2015-12-14 DIAGNOSIS — R0789 Other chest pain: Secondary | ICD-10-CM | POA: Diagnosis not present

## 2015-12-22 ENCOUNTER — Encounter (HOSPITAL_COMMUNITY): Payer: Self-pay | Admitting: *Deleted

## 2015-12-24 DIAGNOSIS — E785 Hyperlipidemia, unspecified: Secondary | ICD-10-CM | POA: Diagnosis not present

## 2015-12-24 DIAGNOSIS — I10 Essential (primary) hypertension: Secondary | ICD-10-CM | POA: Diagnosis not present

## 2015-12-24 DIAGNOSIS — I5181 Takotsubo syndrome: Secondary | ICD-10-CM | POA: Diagnosis not present

## 2015-12-24 DIAGNOSIS — I2510 Atherosclerotic heart disease of native coronary artery without angina pectoris: Secondary | ICD-10-CM | POA: Diagnosis not present

## 2015-12-24 DIAGNOSIS — I252 Old myocardial infarction: Secondary | ICD-10-CM | POA: Diagnosis not present

## 2015-12-30 ENCOUNTER — Encounter (HOSPITAL_COMMUNITY): Payer: Self-pay | Admitting: *Deleted

## 2015-12-30 ENCOUNTER — Ambulatory Visit (HOSPITAL_COMMUNITY): Payer: Medicare Other | Admitting: Certified Registered"

## 2015-12-30 ENCOUNTER — Ambulatory Visit (HOSPITAL_COMMUNITY)
Admission: RE | Admit: 2015-12-30 | Discharge: 2015-12-30 | Disposition: A | Discharge status: Home / Self Care | Payer: Medicare Other | Source: Ambulatory Visit | Attending: Gastroenterology | Admitting: Gastroenterology

## 2015-12-30 ENCOUNTER — Encounter (HOSPITAL_COMMUNITY)
Admission: RE | Disposition: A | Discharge status: Home / Self Care | Payer: Self-pay | Source: Ambulatory Visit | Attending: Gastroenterology

## 2015-12-30 DIAGNOSIS — E039 Hypothyroidism, unspecified: Secondary | ICD-10-CM | POA: Diagnosis not present

## 2015-12-30 DIAGNOSIS — I252 Old myocardial infarction: Secondary | ICD-10-CM | POA: Diagnosis not present

## 2015-12-30 DIAGNOSIS — Z79899 Other long term (current) drug therapy: Secondary | ICD-10-CM | POA: Diagnosis not present

## 2015-12-30 DIAGNOSIS — I10 Essential (primary) hypertension: Secondary | ICD-10-CM | POA: Insufficient documentation

## 2015-12-30 DIAGNOSIS — E785 Hyperlipidemia, unspecified: Secondary | ICD-10-CM | POA: Diagnosis not present

## 2015-12-30 DIAGNOSIS — K219 Gastro-esophageal reflux disease without esophagitis: Secondary | ICD-10-CM | POA: Diagnosis not present

## 2015-12-30 DIAGNOSIS — Z1231 Encounter for screening mammogram for malignant neoplasm of breast: Secondary | ICD-10-CM | POA: Diagnosis not present

## 2015-12-30 DIAGNOSIS — R1319 Other dysphagia: Secondary | ICD-10-CM | POA: Diagnosis not present

## 2015-12-30 DIAGNOSIS — K449 Diaphragmatic hernia without obstruction or gangrene: Secondary | ICD-10-CM | POA: Diagnosis not present

## 2015-12-30 DIAGNOSIS — Z7982 Long term (current) use of aspirin: Secondary | ICD-10-CM | POA: Diagnosis not present

## 2015-12-30 DIAGNOSIS — R1314 Dysphagia, pharyngoesophageal phase: Secondary | ICD-10-CM | POA: Diagnosis not present

## 2015-12-30 HISTORY — PX: ESOPHAGOGASTRODUODENOSCOPY (EGD) WITH PROPOFOL: SHX5813

## 2015-12-30 SURGERY — ESOPHAGOGASTRODUODENOSCOPY (EGD) WITH PROPOFOL
Anesthesia: Monitor Anesthesia Care

## 2015-12-30 MED ORDER — LIDOCAINE HCL (CARDIAC) 20 MG/ML IV SOLN
INTRAVENOUS | Status: DC | PRN
  Administered 2015-12-30: 20 mg via INTRAVENOUS

## 2015-12-30 MED ORDER — SODIUM CHLORIDE 0.9 % IJ SOLN
INTRAMUSCULAR | Status: AC
  Filled 2015-12-30: qty 10

## 2015-12-30 MED ORDER — MIDAZOLAM HCL 2 MG/2ML IJ SOLN
INTRAMUSCULAR | Status: AC
  Filled 2015-12-30: qty 2

## 2015-12-30 MED ORDER — PROPOFOL 10 MG/ML IV BOLUS
INTRAVENOUS | Status: DC | PRN
  Administered 2015-12-30: 40 mg via INTRAVENOUS
  Administered 2015-12-30: 50 mg via INTRAVENOUS

## 2015-12-30 MED ORDER — PROPOFOL 10 MG/ML IV BOLUS
INTRAVENOUS | Status: AC
  Filled 2015-12-30: qty 40

## 2015-12-30 MED ORDER — SODIUM CHLORIDE 0.9 % IV SOLN: INTRAVENOUS | Status: DC

## 2015-12-30 MED ORDER — EPHEDRINE SULFATE 50 MG/ML IJ SOLN
INTRAMUSCULAR | Status: AC
  Filled 2015-12-30: qty 1

## 2015-12-30 MED ORDER — LIDOCAINE HCL (CARDIAC) 20 MG/ML IV SOLN
INTRAVENOUS | Status: AC
  Filled 2015-12-30: qty 5

## 2015-12-30 MED ORDER — MIDAZOLAM HCL 2 MG/2ML IJ SOLN
INTRAMUSCULAR | Status: DC | PRN
  Administered 2015-12-30: 0.5 mg via INTRAVENOUS

## 2015-12-30 MED ORDER — LACTATED RINGERS IV SOLN
INTRAVENOUS | Status: DC
  Administered 2015-12-30: 1000 mL via INTRAVENOUS
  Administered 2015-12-30: 07:00:00 via INTRAVENOUS

## 2015-12-30 SURGICAL SUPPLY — 14 items

## 2015-12-30 NOTE — H&P (Signed)
  History:  This patient presents for endoscopic testing for dysphagia.  Cindy Simpson Referring physician: Walker Kehr, MD  Past Medical History: Past Medical History  Diagnosis Date  . GERD (gastroesophageal reflux disease)   . Hyperlipidemia   . Hypothyroidism   . Allergic rhinitis   . IBS (irritable bowel syndrome)   . Insomnia   . Asthmatic bronchitis 2009    allergies; post nasal drip but not allergies  . Torticollis 2006    Estimate date; rec'd botox and resolved  . Irregular heart beat Not sure; long time ago    Periodic irregular heart rate; take verapamil.  . Anemia   . Status post dilation of esophageal narrowing      Past Surgical History: Past Surgical History  Procedure Laterality Date  . Abdominal hysterectomy    . Cataract extraction Right 2014    approx 2 years ago  . Tubal ligation Bilateral 1985  . Cardiac catheterization N/A 09/13/2015    Procedure: Left Heart Cath and Coronary Angiography;  Surgeon: Charolette Forward, MD;  Location: [facility];  Service: Cardiovascular;  Laterality: N/A;  . Cleft palate repair      Allergies: Allergies  Allergen Reactions  . Lisinopril Cough  . Metoclopramide Hcl Other (See Comments)    Neck spasms  . Vinegar [Acetic Acid] Swelling and Other (See Comments)    Throat closes trouble breathing with vinegar.    Outpatient Meds: Current Facility-Administered Medications  Medication Dose Route Frequency Provider Last Rate Last Dose  . 0.9 %  sodium chloride infusion   Intravenous Continuous [name], MD      . lactated ringers infusion   Intravenous Continuous [name], MD 10 mL/hr at 12/30/15 0645 1,000 mL at 12/30/15 0645      ___________________________________________________________________ Objective  Exam:  BP 122/65 mmHg  Pulse 68  Temp(Src) 98.2 F (36.8 C) (Oral)  Resp 15  Ht 5\' 2"  (1.575 m)  Wt 160 lb (72.576 kg)  BMI 29.26 kg/m2  SpO2 100%   CV: RRR without  murmur, S1/S2, no JVD, no peripheral edema  Resp: clear to auscultation bilaterally, normal RR and effort noted  GI: soft, no tenderness, with active bowel sounds. No guarding or palpable organomegaly noted.  Neuro: awake, alert and oriented x 3. Normal gross motor function and fluent speech   Assessment:  Dysphagia Large hiatal hernia  Plan:  EGD   [name]

## 2015-12-30 NOTE — Interval H&P Note (Signed)
History and Physical Interval Note:  12/30/2015 7:34 AM  Cindy Simpson  has presented today for surgery, with the diagnosis of hiatal hernia  The various methods of treatment have been discussed with the patient and family. After consideration of risks, benefits and other options for treatment, the patient has consented to  Procedure(s): ESOPHAGOGASTRODUODENOSCOPY (EGD) WITH PROPOFOL (N/A) as a surgical intervention .  The patient's history has been reviewed, patient examined, no change in status, stable for surgery.  I have reviewed the patient's chart and labs.  Questions were answered to the patient's satisfaction.     [name]

## 2015-12-30 NOTE — Op Note (Signed)
[name] Procedure Date: 12/30/2015 MRN: [id number] Attending MD: Starr Lake. Myrtie Neither , MD Date of Birth: 1945-11-12 CSN: [id number] Age: 70 Admit Type: Outpatient Procedure:                Upper GI endoscopy Indications:              Esophageal dysphagia Providers:                Sherilyn Cooter L. Myrtie Neither, MD, Dwain Sarna, RN, [facility]                            Tech, Technician, Heron Nay, CRNA Referring MD:              Medicines:                Monitored Anesthesia Care Complications:            No immediate complications. Estimated Blood Loss:     Estimated blood loss: none. Procedure:                Pre-Anesthesia Assessment:                           - Prior to the procedure, a History and Physical                            was performed, and patient medications and                            allergies were reviewed. The patient's tolerance of                            previous anesthesia was also reviewed. The risks                            and benefits of the procedure and the sedation                            options and risks were discussed with the patient.                            All questions were answered, and informed consent                            was obtained. Prior Anticoagulants: The patient has                            taken no previous anticoagulant or antiplatelet                            agents. ASA Grade Assessment: III - A patient with                            severe systemic disease. After reviewing the risks  and benefits, the patient was deemed in                            satisfactory condition to undergo the procedure.                           After obtaining informed consent, the endoscope was                            passed under direct vision. Throughout the                            procedure, the patient's blood pressure, pulse, and     oxygen saturations were monitored continuously. The                            [id number]) scope was introduced through the                            mouth, and advanced to the second part of duodenum.                            The upper GI endoscopy was accomplished without                            difficulty. The patient tolerated the procedure                            well. Findings:      A large hiatal hernia was present. Approximately half of the stomach was       herniated, reflecting recent UGIS findings. There is no esophageal       stricture. The distal esophagus was tortutous, and the EGJ was at 30cm       from the incisors.      The stomach was normal. Retroflexion could not be performed due to the       hernia.      The examined duodenum was normal. Impression:               - Large hiatal hernia.                           - Normal stomach.                           - Normal examined duodenum.                           - No specimens collected. Moderate Sedation:      MAC sedation used Recommendation:           - Patient has a contact number available for                            emergencies. The signs and symptoms of potential  delayed complications were discussed with the                            patient. Return to normal activities tomorrow.                            Written discharge instructions were provided to the                            patient.                           - Resume previous diet.                           - Continue present medications.                           - Outpatient surgical consult will be obtained to                            plan reduction of hernia. Procedure Code(s):        --- Professional ---                           [id number], Esophagogastroduodenoscopy, flexible,                            transoral; diagnostic, including collection of                            specimen(s) by brushing  or washing, when performed                            (separate procedure) Diagnosis Code(s):        --- Professional ---                           K44.9, Diaphragmatic hernia without obstruction or                            gangrene                           R13.14, Dysphagia, pharyngoesophageal phase CPT copyright 2016 [employer]. All rights reserved. The codes documented in this report are preliminary and upon coder review may  be revised to meet current compliance requirements. Momen Ham L. Myrtie Neither, MD 12/30/2015 7:58:56 AM This report has been signed electronically. Number of Addenda: 0

## 2015-12-30 NOTE — Anesthesia Preprocedure Evaluation (Addendum)
Anesthesia Evaluation   Patient awake    Reviewed: Allergy & Precautions, NPO status , Patient's Chart, lab work & pertinent test results  Airway Mallampati: I  TM Distance: >3 FB Neck ROM: Full    Dental  (+) Edentulous Upper, Upper Dentures, Dental Advisory Given, Poor Dentition, Chipped, Missing   Pulmonary    Pulmonary exam normal        Cardiovascular hypertension, Pt. on medications + Past MI  Normal cardiovascular exam     Neuro/Psych    GI/Hepatic GERD  Medicated and Controlled,  Endo/Other    Renal/GU      Musculoskeletal   Abdominal   Peds  Hematology   Anesthesia Other Findings   Reproductive/Obstetrics                            Anesthesia Physical Anesthesia Plan  ASA: III  Anesthesia Plan: MAC   Post-op Pain Management:    Induction: Intravenous  Airway Management Planned: Simple Face Mask  Additional Equipment:   Intra-op Plan:   Post-operative Plan:   Informed Consent: I have reviewed the patients History and Physical, chart, labs and discussed the procedure including the risks, benefits and alternatives for the proposed anesthesia with the patient or authorized representative who has indicated his/her understanding and acceptance.     Plan Discussed with: CRNA and Surgeon  Anesthesia Plan Comments:         Anesthesia Quick Evaluation

## 2015-12-30 NOTE — Discharge Instructions (Signed)
Esophagogastroduodenoscopy, Care After Refer to this sheet in the next few weeks. These instructions provide you with information about caring for yourself after your procedure. Your health care provider may also give you more specific instructions. Your treatment has been planned according to current medical practices, but problems sometimes occur. Call your health care provider if you have any problems or questions after your procedure. WHAT TO EXPECT AFTER THE PROCEDURE After your procedure, it is typical to feel:  Soreness in your throat.  Pain with swallowing.  Sick to your stomach (nauseous).  Bloated.  Dizzy.  Fatigued. HOME CARE INSTRUCTIONS  Do not eat or drink anything until the numbing medicine (local anesthetic) has worn off and your gag reflex has returned. You will know that the local anesthetic has worn off when you can swallow comfortably.  Do not drive or operate machinery until directed by your health care provider.  Take medicines only as directed by your health care provider. SEEK MEDICAL CARE IF:   You cannot stop coughing.  You are not urinating at all or less than usual. SEEK IMMEDIATE MEDICAL CARE IF:  You have difficulty swallowing.  You cannot eat or drink.  You have worsening throat or chest pain.  You have dizziness or lightheadedness or you faint.  You have nausea or vomiting.  You have chills.  You have a fever.  You have severe abdominal pain.  You have black, tarry, or bloody stools.   This information is not intended to replace advice given to you by your health care provider. Make sure you discuss any questions you have with your health care provider.   Document Released: 06/21/2012 Document Revised: 07/26/2014 Document Reviewed: 06/21/2012 Elsevier Interactive Patient Education 2016 [place] Monitored anesthesia care is an anesthesia service for a medical procedure. Anesthesia is the loss of  the ability to feel pain. It is produced by medicines called anesthetics. It may affect a small area of your body (local anesthesia), a large area of your body (regional anesthesia), or your entire body (general anesthesia). The need for monitored anesthesia care depends your procedure, your condition, and the potential need for regional or general anesthesia. It is often provided during procedures where:   General anesthesia may be needed if there are complications. This is because you need special care when you are under general anesthesia.   You will be under local or regional anesthesia. This is so that you are able to have higher levels of anesthesia if needed.   You will receive calming medicines (sedatives). This is especially the case if sedatives are given to put you in a semi-conscious state of relaxation (deep sedation). This is because the amount of sedative needed to produce this state can be hard to predict. Too much of a sedative can produce general anesthesia. Monitored anesthesia care is performed by one or more health care providers who have special training in all types of anesthesia. You will need to meet with these health care providers before your procedure. During this meeting, they will ask you about your medical history. They will also give you instructions to follow. (For example, you will need to stop eating and drinking before your procedure. You may also need to stop or change medicines you are taking.) During your procedure, your health care providers will stay with you. They will:   Watch your condition. This includes watching your blood pressure, breathing, and level of pain.   Diagnose and treat problems that  occur.   Give medicines if they are needed. These may include calming medicines (sedatives) and anesthetics.   Make sure you are comfortable.  Having monitored anesthesia care does not necessarily mean that you will be under anesthesia. It does mean that  your health care providers will be able to manage anesthesia if you need it or if it occurs. It also means that you will be able to have a different type of anesthesia than you are having if you need it. When your procedure is complete, your health care providers will continue to watch your condition. They will make sure any medicines wear off before you are allowed to go home.    This information is not intended to replace advice given to you by your health care provider. Make sure you discuss any questions you have with your health care provider.   Document Released: 03/31/2005 Document Revised: 07/26/2014 Document Reviewed: 08/16/2012 Elsevier Interactive Patient Education [employer].

## 2015-12-30 NOTE — Transfer of Care (Signed)
Immediate Anesthesia Transfer of Care Note  Patient: Cindy Simpson  Procedure(s) Performed: Procedure(s): ESOPHAGOGASTRODUODENOSCOPY (EGD) WITH PROPOFOL (N/A)  Patient Location: PACU  Anesthesia Type:MAC  Level of Consciousness: awake and alert   Airway & Oxygen Therapy: Spont RR and [place] O2  Post-op Assessment: Report given to RN and Post -op Vital signs reviewed and stable  Post vital signs: Reviewed and stable  Last Vitals:  Filed Vitals:   12/30/15 0632  BP: 122/65  Pulse: 68  Temp: 36.8 C  Resp: 15    Last Pain: There were no vitals filed for this visit.       Complications: No apparent anesthesia complications

## 2015-12-30 NOTE — Anesthesia Postprocedure Evaluation (Signed)
Anesthesia Post Note  Patient: Cindy Simpson  Procedure(s) Performed: Procedure(s) (LRB): ESOPHAGOGASTRODUODENOSCOPY (EGD) WITH PROPOFOL (N/A)  Patient location during evaluation: PACU Anesthesia Type: MAC Level of consciousness: awake and alert Pain management: pain level controlled Vital Signs Assessment: post-procedure vital signs reviewed and stable Respiratory status: spontaneous breathing, nonlabored ventilation, respiratory function stable and patient connected to nasal cannula oxygen Cardiovascular status: stable and blood pressure returned to baseline Anesthetic complications: no    Last Vitals:  Filed Vitals:   12/30/15 0810 12/30/15 0820  BP: 109/50 114/67  Pulse:  64  Temp:    Resp: 15 13    Last Pain: There were no vitals filed for this visit.               [place]

## 2015-12-31 ENCOUNTER — Ambulatory Visit (INDEPENDENT_AMBULATORY_CARE_PROVIDER_SITE_OTHER): Payer: Medicare Other | Admitting: Internal Medicine

## 2015-12-31 ENCOUNTER — Encounter: Payer: Self-pay | Admitting: Internal Medicine

## 2015-12-31 VITALS — BP 108/78 | HR 78 | Wt 161.0 lb

## 2015-12-31 DIAGNOSIS — K219 Gastro-esophageal reflux disease without esophagitis: Secondary | ICD-10-CM

## 2015-12-31 MED ORDER — PANTOPRAZOLE SODIUM 40 MG PO TBEC: 40.0000 mg | DELAYED_RELEASE_TABLET | Freq: Two times a day (BID) | ORAL | Status: DC

## 2015-12-31 MED ORDER — PANTOPRAZOLE SODIUM 40 MG PO TBEC: 40.0000 mg | DELAYED_RELEASE_TABLET | Freq: Every day | ORAL | Status: DC

## 2015-12-31 NOTE — Assessment & Plan Note (Signed)
Dr Loletha Carrow Chronic. Large HH Prilosec bid - not effective 5/17 start Aciphex bid - not covered. 6/17 start Protonix GERD wedge pillow

## 2015-12-31 NOTE — Progress Notes (Signed)
Subjective:  Patient ID: Cindy Simpson, female    DOB: December 25, 1945  Age: 70 y.o. MRN: [id number]  CC: No chief complaint on file.   HPI Cindy Simpson presents for ER visit in [place], [place] for esoph spasm f/u (May 2017). Dr Myrtie Neither did EGD yesterday - large HH. Aciphex is not covered....  Outpatient Prescriptions Prior to Visit  Medication Sig Dispense Refill  . aspirin EC 81 MG EC tablet Take 1 tablet (81 mg total) by mouth daily. 30 tablet 3  . atorvastatin (LIPITOR) 40 MG tablet Take 1 tablet (40 mg total) by mouth daily at 6 PM. 30 tablet 3  . Cholecalciferol 1000 UNITS tablet Take 1,000 Units by mouth daily.     . IRON PO Take 1 tablet by mouth daily.    Marland Kitchen KRILL OIL PO Take 1 capsule by mouth daily.    Marland Kitchen levothyroxine (SYNTHROID, LEVOTHROID) 88 MCG tablet Take 1 tablet (88 mcg total) by mouth daily. (Patient taking differently: Take 88 mcg by mouth at bedtime. ) 90 tablet 1  . loratadine (CLARITIN) 10 MG tablet Take 10 mg by mouth at bedtime.     . Multiple Vitamin (MULTIVITAMIN WITH MINERALS) TABS tablet Take 1 tablet by mouth daily.    . naproxen sodium (ALEVE) 220 MG tablet Take 220 mg by mouth 2 (two) times daily as needed (For pain.).    Marland Kitchen nitroGLYCERIN (NITROSTAT) 0.4 MG SL tablet Place 1 tablet (0.4 mg total) under the tongue every 5 (five) minutes x 3 doses as needed for chest pain. 25 tablet 12  . omeprazole (PRILOSEC) 40 MG capsule Take 40 mg by mouth 2 (two) times daily.    . promethazine-codeine (PHENERGAN WITH CODEINE) 6.25-10 MG/5ML syrup Take 5 mLs by mouth every 4 (four) hours as needed. (Patient taking differently: Take 5 mLs by mouth every 4 (four) hours as needed for cough. ) 300 mL 0  . valsartan (DIOVAN) 160 MG tablet Take 1 tablet (160 mg total) by mouth daily. 90 tablet 3  . metoprolol tartrate (LOPRESSOR) 25 MG tablet Take 0.5 tablets (12.5 mg total) by mouth 2 (two) times daily. (Patient taking differently: Take 12.5 mg by mouth at bedtime. ) 90 tablet 3    No facility-administered medications prior to visit.    ROS Review of Systems  Constitutional: Negative for chills, activity change, appetite change, fatigue and unexpected weight change.  HENT: Negative for congestion, mouth sores and sinus pressure.   Eyes: Negative for visual disturbance.  Respiratory: Positive for cough. Negative for chest tightness.   Gastrointestinal: Negative for nausea, vomiting and abdominal pain.  Genitourinary: Negative for frequency, difficulty urinating and vaginal pain.  Musculoskeletal: Negative for back pain and gait problem.  Skin: Negative for pallor and rash.  Neurological: Negative for dizziness, tremors, weakness, numbness and headaches.  Psychiatric/Behavioral: Negative for confusion and sleep disturbance.  GERD, phlegm in am's  Objective:  BP 108/78 mmHg  Pulse 78  Wt 161 lb (73.029 kg)  SpO2 94%  BP Readings from Last 3 Encounters:  12/31/15 108/78  12/30/15 114/67  12/01/15 130/70    Wt Readings from Last 3 Encounters:  12/31/15 161 lb (73.029 kg)  12/30/15 160 lb (72.576 kg)  12/01/15 160 lb (72.576 kg)    Physical Exam  Constitutional: She appears well-developed. No distress.  HENT:  Head: Normocephalic.  Right Ear: External ear normal.  Left Ear: External ear normal.  Nose: Nose normal.  Mouth/Throat: Oropharynx is clear and moist.  Eyes:  Conjunctivae are normal. Pupils are equal, round, and reactive to light. Right eye exhibits no discharge. Left eye exhibits no discharge.  Neck: Normal range of motion. Neck supple. No JVD present. No tracheal deviation present. No thyromegaly present.  Cardiovascular: Normal rate, regular rhythm and normal heart sounds.   Pulmonary/Chest: No stridor. No respiratory distress. She has no wheezes.  Abdominal: Soft. Bowel sounds are normal. She exhibits no distension and no mass. There is no tenderness. There is no rebound and no guarding.  Musculoskeletal: She exhibits no edema or  tenderness.  Lymphadenopathy:    She has no cervical adenopathy.  Neurological: She displays normal reflexes. No cranial nerve deficit. She exhibits normal muscle tone. Coordination normal.  Skin: No rash noted. No erythema.  Psychiatric: She has a normal mood and affect. Her behavior is normal. Judgment and thought content normal.  Obese  Lab Results  Component Value Date   WBC 7.0 11/03/2015   HGB 12.8 11/03/2015   HCT 38.1 11/03/2015   PLT 292.0 11/03/2015   GLUCOSE 95 11/03/2015   CHOL 166 09/14/2015   TRIG 93 09/14/2015   HDL 45 09/14/2015   LDLDIRECT 137.5 03/17/2009   LDLCALC 102* 09/14/2015   ALT 20 09/13/2015   AST 32 09/13/2015   NA 137 11/03/2015   K 4.7 11/03/2015   CL 100 11/03/2015   CREATININE 0.95 11/03/2015   BUN 33* 11/03/2015   CO2 27 11/03/2015   TSH 5.00* 11/03/2015   INR 1.20 09/13/2015   HGBA1C 5.9 11/03/2015    No results found.  Assessment & Plan:   There are no diagnoses linked to this encounter. I have discontinued Cindy Simpson's metoprolol tartrate. I am also having her maintain her Cholecalciferol, loratadine, multivitamin with minerals, aspirin, atorvastatin, nitroGLYCERIN, levothyroxine, valsartan, promethazine-codeine, omeprazole, KRILL OIL PO, IRON PO, naproxen sodium, and carvedilol.  Meds ordered this encounter  Medications  . carvedilol (COREG) 3.125 MG tablet    Sig: Take 3.125 mg by mouth 2 (two) times daily.    Refill:  3     Follow-up: No Follow-up on file.  [name], MD

## 2015-12-31 NOTE — Progress Notes (Signed)
Pre visit review using our clinic review tool, if applicable. No additional management support is needed unless otherwise documented below in the visit note. 

## 2015-12-31 NOTE — Patient Instructions (Signed)
GERD wedge pillow 

## 2016-01-01 ENCOUNTER — Telehealth: Payer: Self-pay

## 2016-01-01 NOTE — Telephone Encounter (Signed)
Appointment scheduled with Dr Zella Richer at [facility] for 01-13-2016 @ 345pm. Arrive 15 min early. Bring insurance cards,ID card and medication list. Tried to inform pt. No answer so VM left to return call.

## 2016-01-05 NOTE — Telephone Encounter (Signed)
Left message to return call 

## 2016-01-05 NOTE — Telephone Encounter (Signed)
Patient returned phone call. °

## 2016-01-06 NOTE — Telephone Encounter (Signed)
Pt notified and aware. She received the new pt paper work from [employer].

## 2016-01-13 DIAGNOSIS — K219 Gastro-esophageal reflux disease without esophagitis: Secondary | ICD-10-CM | POA: Diagnosis not present

## 2016-01-13 DIAGNOSIS — K449 Diaphragmatic hernia without obstruction or gangrene: Secondary | ICD-10-CM | POA: Diagnosis not present

## 2016-01-14 ENCOUNTER — Telehealth: Payer: Self-pay | Admitting: Gastroenterology

## 2016-01-14 NOTE — Telephone Encounter (Signed)
Dr Loletha Carrow do you want me to set this pt up for a manometry?  I will put [facility] records on your desk for review.

## 2016-01-15 NOTE — Telephone Encounter (Signed)
Please schedule for esophageal manometry ASAP.

## 2016-01-15 NOTE — Telephone Encounter (Signed)
Left message on machine to call back   You have been scheduled for an esophageal manometry at [facility] on 01/19/16 at 2:30 pm. Please arrive 30 minutes prior to your procedure for registration. You will need to go to outpatient registration (1st floor of the hospital) first. Make certain to bring your insurance cards as well as a complete list of medications.  Please remember the following:  1) Nothing to eat or drink after 12:00 midnight on the night before your test.  2) Hold all diabetic medications/insulin the morning of the test. You may eat and take your medications after the test.  3) For 3 days prior to your test do not take: Dexilant, Prevacid, Nexium, Protonix, Aciphex, Zegerid, Pantoprazole, Prilosec or omeprazole.  4) For 2 days prior to your test, do not take: Reglan, Tagamet, Zantac, Axid or Pepcid.  5) You MAY use an antacid such as Rolaids or Tums up to 12 hours prior to your test.  It will take at least 2 weeks to receive the results of this test from your physician. ------------------------------------------ ABOUT ESOPHAGEAL MANOMETRY Esophageal manometry (muh-NOM-uh-tree) is a test that gauges how well your esophagus works. Your esophagus is the long, muscular tube that connects your throat to your stomach. Esophageal manometry measures the rhythmic muscle contractions (peristalsis) that occur in your esophagus when you swallow. Esophageal manometry also measures the coordination and force exerted by the muscles of your esophagus.  During esophageal manometry, a thin, flexible tube (catheter) that contains sensors is passed through your nose, down your esophagus and into your stomach. Esophageal manometry can be helpful in diagnosing some mostly uncommon disorders that affect your esophagus.  Why it's done Esophageal manometry is used to evaluate the movement (motility) of food through the esophagus and into the stomach. The test measures how well the circular  bands of muscle (sphincters) at the top and bottom of your esophagus open and close, as well as the pressure, strength and pattern of the wave of esophageal muscle contractions that moves food along.  What you can expect Esophageal manometry is an outpatient procedure done without sedation. Most people tolerate it well. You may be asked to change into a hospital gown before the test starts.  During esophageal manometry  While you are sitting up, a member of your health care team sprays your throat with a numbing medication or puts numbing gel in your nose or both.  A catheter is guided through your nose into your esophagus. The catheter may be sheathed in a water-filled sleeve. It doesn't interfere with your breathing. However, your eyes may water, and you may gag. You may have a slight nosebleed from irritation.  After the catheter is in place, you may be asked to lie on your back on an exam table, or you may be asked to remain seated.  You then swallow small sips of water. As you do, a computer connected to the catheter records the pressure, strength and pattern of your esophageal muscle contractions.  During the test, you'll be asked to breathe slowly and smoothly, remain as still as possible, and swallow only when you're asked to do so.  A member of your health care team may move the catheter down into your stomach while the catheter continues its measurements.  The catheter then is slowly withdrawn. The test usually lasts 20 to 30 minutes.  After esophageal manometry  When your esophageal manometry is complete, you may return to your normal activities  This test  typically takes 30-45 minutes to complete. ________________________________________________________________________________

## 2016-01-16 NOTE — Telephone Encounter (Signed)
The pt has been instructed and she did verbalize understanding.  She will call with further questions

## 2016-01-19 ENCOUNTER — Ambulatory Visit (HOSPITAL_COMMUNITY)
Admission: RE | Admit: 2016-01-19 | Discharge: 2016-01-19 | Disposition: A | Discharge status: Home / Self Care | Payer: Medicare Other | Source: Ambulatory Visit | Attending: Gastroenterology | Admitting: Gastroenterology

## 2016-01-19 ENCOUNTER — Encounter (HOSPITAL_COMMUNITY)
Admission: RE | Disposition: A | Discharge status: Home / Self Care | Payer: Self-pay | Source: Ambulatory Visit | Attending: Gastroenterology

## 2016-01-19 DIAGNOSIS — Z538 Procedure and treatment not carried out for other reasons: Secondary | ICD-10-CM | POA: Diagnosis not present

## 2016-01-19 DIAGNOSIS — R1310 Dysphagia, unspecified: Secondary | ICD-10-CM | POA: Insufficient documentation

## 2016-01-19 SURGERY — INVASIVE LAB ABORTED CASE

## 2016-01-19 MED ORDER — LIDOCAINE VISCOUS 2 % MT SOLN
OROMUCOSAL | Status: AC
  Filled 2016-01-19: qty 15

## 2016-01-19 SURGICAL SUPPLY — 2 items
FACESHIELD LNG OPTICON STERILE (SAFETY) IMPLANT
GLOVE BIO SURGEON STRL SZ8 (GLOVE) ×6 IMPLANT

## 2016-01-19 NOTE — Progress Notes (Signed)
Esophageal Manometry Procedure Aborted.  The manometry probe was inserted after lidocaine was placed in right nare.  Patient initially choked but after giving patient sips of water the probe was easily passed into the esophagus.  Swallows and gags were visible on monitor and probe advanced easily.  Near 40 cm patient started to gasp for air. Advancement was stopped and probe was removed.  Patient continued to gasp and appeared to be in respiratory distress.  Patient remained calm while visually appearing unable to move air sufficiently.  Patient remained conscious, calm, and aware.  Episode lasted less than two minutes with no apparent hypoxia.  Oxygen saturation was checked and was at 100 percent.  Heart rate was 75.  When patient was able to speak again she informed the two RNs present she has had these episodes multiple times while eating or coughing.  She states she has been transported via ambulance to the hospital for them.  On auscultation lungs were clear bilaterally.  Patient returned to same condition as pre-procedure and stated she "felt normal".  Patient remained for observation for an hour. Dr Ardis Hughs was contacted and informed of situation.  He stated he felt we could discharge patient home.

## 2016-01-19 NOTE — H&P (Signed)
This patient was in the endoscopy lab for a nurse-performed esophageal motility study for dysphagia. Not seen by a physician, so no face to face H+P required.

## 2016-01-22 ENCOUNTER — Ambulatory Visit: Payer: Self-pay | Admitting: General Surgery

## 2016-01-22 NOTE — H&P (Signed)
Cindy Simpson 01/13/2016 3:45 PM Location: [facility] Patient #: [id number] DOB: 16-Sep-1945 Married / Language: English / Race: White Female   History of Present Illness Adolph Pollack MD; 01/13/2016 5:15 PM) The patient is a 70 year old female.  Note:She is referred by Dr. Myrtie Neither for consultation regarding a symptomatic large hiatal hernia. She has a long-standing hiatal hernia and has a history of some intermittent dysphagia as well as regurgitation and heartburn symptoms. She states that after she eats, she gets full and then feels like she is short of breath. She only eats 2 meals a day and her husband says she eats quite a bit at a time. She saw Dr. Myrtie Neither in an upper GI was ordered which demonstrated a large hiatal hernia with 50% of her stomach up in her chest and some evidence of esophageal dysmotility. No stricture. Upper endoscopy was performed which demonstrated the hiatal hernia as well. She is here to discuss surgery for the hiatal hernia. Manometry not able to be done.  Dr. Sharyn Lull feels she is acceptable risk from a cardiac standpoint.  Other Problems Gilmer Mor, CMA; 01/13/2016 3:45 PM) Bladder Problems Gastroesophageal Reflux Disease Hypercholesterolemia Myocardial infarction Oophorectomy Bilateral. Thyroid Disease  Past Surgical History Gilmer Mor, CMA; 01/13/2016 3:45 PM) Cataract Surgery Bilateral. Hysterectomy (not due to cancer) - Complete Oral Surgery  Diagnostic Studies History Gilmer Mor, CMA; 01/13/2016 3:45 PM) Colonoscopy >10 years ago Mammogram within last year Pap Smear >5 years ago  Allergies Gilmer Mor, CMA; 01/13/2016 3:46 PM) Lisinopril *ANTIHYPERTENSIVES* Acetic Acid *CHEMICALS* Metoclopramide HCl *GASTROINTESTINAL AGENTS - MISC.*  Medication History Gilmer Mor, CMA; 01/13/2016 3:47 PM) Carvedilol (3.125MG  Tablet, Oral) Active. Levothyroxine Sodium ( Tablet, Oral) Active. Lisinopril (5MG   Tablet, Oral) Active. Lovastatin (10MG  Tablet, Oral) Active. Nitroglycerin (0.4MG  Tab Sublingual, Sublingual) Active. Pantoprazole Sodium (40MG  Tablet DR, Oral) Active. Valsartan (160MG  Tablet, Oral) Active. Metoprolol Tartrate (25MG  Tablet, Oral) Active. Triamcinolone Acetonide (0.5% Ointment, External as needed) Active. Medications Reconciled  Social History Gilmer Mor, CMA; 01/13/2016 3:45 PM) Alcohol use Occasional alcohol use. Caffeine use Coffee, Tea. No drug use Tobacco use Never smoker.  Family History Gilmer Mor, CMA; 01/13/2016 3:45 PM) Depression Sister. Diabetes Mellitus Family Members In General. Heart Disease Father, Sister. Heart disease in female family member before age 9 Heart disease in female family member before age 72 Ischemic Bowel Disease Sister. Respiratory Condition Sister.  Pregnancy / Birth History Gilmer Mor, CMA; 01/13/2016 3:45 PM) Age at menarche 11 years. Age of menopause 59-50 Contraceptive History Oral contraceptives. Gravida 1 Maternal age 66-20 Para 0    Review of Systems Lamar Laundry Bynum CMA; 01/13/2016 3:45 PM) General Not Present- Appetite Loss, Chills, Fatigue, Fever, Night Sweats, Weight Gain and Weight Loss. Skin Not Present- Change in Wart/Mole, Dryness, Hives, Jaundice, New Lesions, Non-Healing Wounds, Rash and Ulcer. HEENT Present- Ringing in the Ears, Seasonal Allergies and Wears glasses/contact lenses. Not Present- Earache, Hearing Loss, Hoarseness, Nose Bleed, Oral Ulcers, Sinus Pain, Sore Throat, Visual Disturbances and Yellow Eyes. Respiratory Present- Chronic Cough and Snoring. Not Present- Bloody sputum, Difficulty Breathing and Wheezing. Breast Not Present- Breast Mass, Breast Pain, Nipple Discharge and Skin Changes. Cardiovascular Not Present- Chest Pain, Difficulty Breathing Lying Down, Leg Cramps, Palpitations, Rapid Heart Rate, Shortness of Breath and Swelling of Extremities. Gastrointestinal  Present- Constipation, Difficulty Swallowing and Indigestion. Not Present- Abdominal Pain, Bloating, Bloody Stool, Change in Bowel Habits, Chronic diarrhea, Excessive gas, Gets full quickly at meals, Hemorrhoids, Nausea, Rectal Pain and Vomiting. Musculoskeletal Present- Back  Pain and Joint Stiffness. Not Present- Joint Pain, Muscle Pain, Muscle Weakness and Swelling of Extremities. Psychiatric Not Present- Anxiety, Bipolar, Change in Sleep Pattern, Depression, Fearful and Frequent crying. Endocrine Not Present- Cold Intolerance, Excessive Hunger, Hair Changes, Heat Intolerance, Hot flashes and New Diabetes. Hematology Not Present- Blood Thinners, Easy Bruising, Excessive bleeding, Gland problems, HIV and Persistent Infections.  Vitals (Sonya Bynum CMA; 01/13/2016 3:46 PM) 01/13/2016 3:45 PM Weight: 159 lb Height: 62in Body Surface Area: 1.73 m Body Mass Index: 29.08 kg/m  Temp.: 97.66F(Temporal)  Pulse: 80 (Regular)  BP: 126/76 (Sitting, Left Arm, Standard)       Physical Exam Adolph Pollack MD; 01/13/2016 5:17 PM) The physical exam findings are as follows: Note:General: Overweight female in NAD. Pleasant and cooperative. Her husband is with her.  HEENT: [place]/AT, no external nasal or ear masses, mucous membranes are moist  EYES: EOMI, no scleral icterus, pupils normal  NECK: Supple, no obvious mass or thyroid mass/enlargement, no trachea deviation  CV: RRR, no murmur, no edema  CHEST: Breath sounds equal and clear. Respirations nonlabored.  ABDOMEN: Soft, nontender, nondistended, no masses, no organomegaly, active bowel sounds, no scars, no hernias.  MUSCULOSKELETAL: FROM, good muscle tone, small lower extremity varicosities, normal station and gait  LYMPHATIC: No palpable cervical, supraclavicular adenopathy.  SKIN: No jaundice or suspicious rashes.  NEUROLOGIC: Alert and oriented, answers questions appropriately, normal gait and station.  PSYCHIATRIC:  Normal mood, affect , and behavior.    Assessment & Plan Adolph Pollack MD; 01/13/2016 5:03 PM) LARGE HIATAL HERNIA (K44.9) Impression: Has some evidence of esophageal dysmotility on esophagram. Also has gastroesophageal reflux symptoms. She is interested in having surgery at this time. We had a long discussion about the operation as well as lifestyle changes needed to try to decrease the risk of recurrence which, in her case, in the range of 10-40%.  Plan: Schedule laparoscopic hiatal hernia repair and Nissen fundoplication. I have explained the procedure, risks, and aftercare. Risks include but are not limited to bleeding, infection, wound problems, anesthesia, dysphagia, injury to esophagus/stomach/liver/spleen/intestine,gas bloat, diarrhea, failure to resolve symptoms,recurrent hernia. She seems to understand and agrees with the plan.  Strict dietary restriction were explained as well as changes in lifestyle.

## 2016-01-26 ENCOUNTER — Telehealth: Payer: Self-pay

## 2016-01-26 NOTE — Telephone Encounter (Signed)
Pt has been notified and states Dr Zella Richer has also called and spoke with her about this.  She will call if she needs any further assistance from GI

## 2016-01-26 NOTE — Telephone Encounter (Signed)
-----   Message from Doran Stabler, MD sent at 01/26/2016  8:59 AM EDT ----- Chong Sicilian,    Please let Rhodia know that I was informed about the difficulty performing the esophageal manometry.  I am not planning to reschedule it, since she clearly cannot tolerate it.  I also informed her surgeon that it is just information we will just have to live without, and he understands.

## 2016-01-27 ENCOUNTER — Other Ambulatory Visit: Payer: Self-pay | Admitting: Internal Medicine

## 2016-02-27 ENCOUNTER — Encounter (HOSPITAL_COMMUNITY): Payer: Self-pay

## 2016-03-03 ENCOUNTER — Encounter (HOSPITAL_COMMUNITY)
Admission: RE | Admit: 2016-03-03 | Discharge: 2016-03-03 | Disposition: A | Discharge status: Home / Self Care | Payer: Medicare Other | Source: Ambulatory Visit | Attending: General Surgery | Admitting: General Surgery

## 2016-03-03 ENCOUNTER — Encounter (HOSPITAL_COMMUNITY): Payer: Self-pay

## 2016-03-03 DIAGNOSIS — Z01812 Encounter for preprocedural laboratory examination: Secondary | ICD-10-CM | POA: Diagnosis not present

## 2016-03-03 HISTORY — DX: Personal history of other diseases of the digestive system: Z87.19

## 2016-03-03 LAB — CBC WITH DIFFERENTIAL/PLATELET
BASOS ABS: 0 10*3/uL (ref 0.0–0.1)
BASOS PCT: 1 %
EOS ABS: 0.3 10*3/uL (ref 0.0–0.7)
EOS PCT: 4 %
HCT: 34.8 % — ABNORMAL LOW (ref 36.0–46.0)
HEMOGLOBIN: 11.4 g/dL — AB (ref 12.0–15.0)
Lymphocytes Relative: 32 %
Lymphs Abs: 2.2 10*3/uL (ref 0.7–4.0)
MCH: 30.6 pg (ref 26.0–34.0)
MCHC: 32.8 g/dL (ref 30.0–36.0)
MCV: 93.3 fL (ref 78.0–100.0)
Monocytes Absolute: 0.8 10*3/uL (ref 0.1–1.0)
Monocytes Relative: 11 %
Neutro Abs: 3.5 10*3/uL (ref 1.7–7.7)
Neutrophils Relative %: 52 %
PLATELETS: 325 10*3/uL (ref 150–400)
RBC: 3.73 MIL/uL — AB (ref 3.87–5.11)
RDW: 13.6 % (ref 11.5–15.5)
WBC: 6.8 10*3/uL (ref 4.0–10.5)

## 2016-03-03 LAB — COMPREHENSIVE METABOLIC PANEL
ALT: 17 U/L (ref 14–54)
ANION GAP: 7 (ref 5–15)
AST: 25 U/L (ref 15–41)
Albumin: 4.6 g/dL (ref 3.5–5.0)
Alkaline Phosphatase: 93 U/L (ref 38–126)
BUN: 20 mg/dL (ref 6–20)
CALCIUM: 9 mg/dL (ref 8.9–10.3)
CHLORIDE: 97 mmol/L — AB (ref 101–111)
CO2: 28 mmol/L (ref 22–32)
CREATININE: 0.87 mg/dL (ref 0.44–1.00)
Glucose, Bld: 92 mg/dL (ref 65–99)
Potassium: 4.9 mmol/L (ref 3.5–5.1)
SODIUM: 132 mmol/L — AB (ref 135–145)
Total Bilirubin: 0.6 mg/dL (ref 0.3–1.2)
Total Protein: 7.1 g/dL (ref 6.5–8.1)

## 2016-03-03 LAB — PROTIME-INR
INR: 0.99
PROTHROMBIN TIME: 13.1 s (ref 11.4–15.2)

## 2016-03-03 LAB — ABO/RH: ABO/RH(D): A POS

## 2016-03-03 NOTE — Patient Instructions (Addendum)
DAILYN MCGEEHAN  03/03/2016   Your procedure is scheduled on: 03-09-16   Report to [facility]  Entrance take [facility]  elevators to 3rd floor to  [place] at  [place] AM.  Call this number if you have problems the morning of surgery [phone]   Remember: ONLY 1 PERSON MAY GO WITH YOU TO SHORT STAY TO GET  READY MORNING OF [place].  Do not eat food or drink liquids :After Midnight.     Take these medicines the morning of surgery with A SIP OF WATER:  Carvedilol.  Stop vitamin supplements and herbal meds today. May continue stool softener, iron- do not take these AM of . Take other prescribed meds usual days as before. DO NOT TAKE ANY DIABETIC MEDICATIONS DAY OF YOUR SURGERY                               You may not have any metal on your body including hair pins and              piercings  Do not wear jewelry, make-up, lotions, powders or perfumes, deodorant             Do not wear nail polish.  Do not shave  48 hours prior to surgery.              Men may shave face and neck.   Do not bring valuables to the hospital. [place].  Contacts, dentures or bridgework may not be worn into surgery.  Leave suitcase in the car. After surgery it may be brought to your room.     Patients discharged the day of surgery will not be allowed to drive home.  Name and phone number of your driver: Cristie Hem -spouse [phone] cell  Special Instructions: N/A              Please read over the following fact sheets you were given: _____________________________________________________________________             [facility] - Preparing for Surgery Before surgery, you can play an important role.  Because skin is not sterile, your skin needs to be as free of germs as possible.  You can reduce the number of germs on your skin by washing with CHG (chlorahexidine gluconate) soap before surgery.  CHG is an antiseptic cleaner  which kills germs and bonds with the skin to continue killing germs even after washing. Please DO NOT use if you have an allergy to CHG or antibacterial soaps.  If your skin becomes reddened/irritated stop using the CHG and inform your nurse when you arrive at Short Stay. Do not shave (including legs and underarms) for at least 48 hours prior to the first CHG shower.  You may shave your face/neck. Please follow these instructions carefully:  1.  Shower with CHG Soap the night before surgery and the  morning of Surgery.  2.  If you choose to wash your hair, wash your hair first as usual with your  normal  shampoo.  3.  After you shampoo, rinse your hair and body thoroughly to remove the  shampoo.  4.  Use CHG as you would any other liquid soap.  You can apply chg directly  to the skin and wash                       Gently with a scrungie or clean washcloth.  5.  Apply the CHG Soap to your body ONLY FROM THE NECK DOWN.   Do not use on face/ open                           Wound or open sores. Avoid contact with eyes, ears mouth and genitals (private parts).                       Wash face,  Genitals (private parts) with your normal soap.             6.  Wash thoroughly, paying special attention to the area where your surgery  will be performed.  7.  Thoroughly rinse your body with warm water from the neck down.  8.  DO NOT shower/wash with your normal soap after using and rinsing off  the CHG Soap.                9.  Pat yourself dry with a clean towel.            10.  Wear clean pajamas.            11.  Place clean sheets on your bed the night of your first shower and do not  sleep with pets. Day of Surgery : Do not apply any lotions/deodorants the morning of surgery.  Please wear clean clothes to the hospital/surgery center.  FAILURE TO FOLLOW THESE INSTRUCTIONS MAY RESULT IN THE CANCELLATION OF YOUR SURGERY PATIENT SIGNATURE_________________________________  NURSE  SIGNATURE__________________________________  ________________________________________________________________________

## 2016-03-03 NOTE — Pre-Procedure Instructions (Signed)
EKG Epic. /Echo 3'17 with chart.

## 2016-03-08 ENCOUNTER — Telehealth: Payer: Self-pay | Admitting: Internal Medicine

## 2016-03-08 DIAGNOSIS — Z1211 Encounter for screening for malignant neoplasm of colon: Secondary | ICD-10-CM

## 2016-03-08 NOTE — Telephone Encounter (Signed)
Patient is requesting cologuard for routine check to be ordered.  States last cologuard and colonoscopy came out with no findings.

## 2016-03-08 NOTE — Anesthesia Preprocedure Evaluation (Addendum)
Anesthesia Evaluation  Patient identified by MRN, date of birth, ID band Patient awake    Reviewed: Allergy & Precautions, H&P , NPO status , Patient's Chart, lab work & pertinent test results  History of Anesthesia Complications Negative for: history of anesthetic complications  Airway Mallampati: III  TM Distance: >3 FB Neck ROM: full    Dental  (+) Edentulous Upper, Poor Dentition, Dental Advisory Given Hx of cleft palate repair, poor lower teeth dentition:   Pulmonary asthma ,    Pulmonary exam normal breath sounds clear to auscultation       Cardiovascular hypertension, Normal cardiovascular exam Rhythm:regular Rate:Normal  Cath 2017 with 15% mid LAD stenosis, non obstructive pattern, EF is 50%   Neuro/Psych  Neuromuscular disease    GI/Hepatic Neg liver ROS, hiatal hernia, GERD  ,  Endo/Other  Hypothyroidism   Renal/GU negative Renal ROS     Musculoskeletal   Abdominal   Peds  Hematology negative hematology ROS (+)   Anesthesia Other Findings   Reproductive/Obstetrics negative OB ROS                          Anesthesia Physical Anesthesia Plan  ASA: III  Anesthesia Plan: General   Post-op Pain Management:    Induction: Intravenous  Airway Management Planned: Oral ETT  Additional Equipment:   Intra-op Plan:   Post-operative Plan: Possible Post-op intubation/ventilation  Informed Consent: I have reviewed the patients History and Physical, chart, labs and discussed the procedure including the risks, benefits and alternatives for the proposed anesthesia with the patient or authorized representative who has indicated his/her understanding and acceptance.   Dental Advisory Given  Plan Discussed with: Anesthesiologist, CRNA and Surgeon  Anesthesia Plan Comments:        Anesthesia Quick Evaluation

## 2016-03-09 ENCOUNTER — Ambulatory Visit (HOSPITAL_COMMUNITY): Payer: Medicare Other | Admitting: Anesthesiology

## 2016-03-09 ENCOUNTER — Encounter (HOSPITAL_COMMUNITY): Payer: Self-pay | Admitting: *Deleted

## 2016-03-09 ENCOUNTER — Encounter (HOSPITAL_COMMUNITY)
Admission: AD | Disposition: A | Discharge status: Home / Self Care | Payer: Self-pay | Source: Ambulatory Visit | Attending: General Surgery

## 2016-03-09 ENCOUNTER — Inpatient Hospital Stay (HOSPITAL_COMMUNITY)
Admission: AD | Admit: 2016-03-09 | Discharge: 2016-03-11 | DRG: 328 | Disposition: A | Discharge status: Home / Self Care | Payer: Medicare Other | Source: Ambulatory Visit | Attending: General Surgery | Admitting: General Surgery

## 2016-03-09 DIAGNOSIS — Z9842 Cataract extraction status, left eye: Secondary | ICD-10-CM | POA: Diagnosis not present

## 2016-03-09 DIAGNOSIS — Z7982 Long term (current) use of aspirin: Secondary | ICD-10-CM | POA: Diagnosis not present

## 2016-03-09 DIAGNOSIS — I252 Old myocardial infarction: Secondary | ICD-10-CM | POA: Diagnosis not present

## 2016-03-09 DIAGNOSIS — J45909 Unspecified asthma, uncomplicated: Secondary | ICD-10-CM | POA: Diagnosis not present

## 2016-03-09 DIAGNOSIS — K224 Dyskinesia of esophagus: Secondary | ICD-10-CM | POA: Diagnosis not present

## 2016-03-09 DIAGNOSIS — Z888 Allergy status to other drugs, medicaments and biological substances status: Secondary | ICD-10-CM

## 2016-03-09 DIAGNOSIS — K219 Gastro-esophageal reflux disease without esophagitis: Secondary | ICD-10-CM | POA: Diagnosis not present

## 2016-03-09 DIAGNOSIS — K5900 Constipation, unspecified: Secondary | ICD-10-CM | POA: Diagnosis not present

## 2016-03-09 DIAGNOSIS — Z9841 Cataract extraction status, right eye: Secondary | ICD-10-CM

## 2016-03-09 DIAGNOSIS — E039 Hypothyroidism, unspecified: Secondary | ICD-10-CM | POA: Diagnosis present

## 2016-03-09 DIAGNOSIS — Z8249 Family history of ischemic heart disease and other diseases of the circulatory system: Secondary | ICD-10-CM | POA: Diagnosis not present

## 2016-03-09 DIAGNOSIS — Z87730 Personal history of (corrected) cleft lip and palate: Secondary | ICD-10-CM | POA: Diagnosis not present

## 2016-03-09 DIAGNOSIS — E7800 Pure hypercholesterolemia, unspecified: Secondary | ICD-10-CM | POA: Diagnosis not present

## 2016-03-09 DIAGNOSIS — Z791 Long term (current) use of non-steroidal anti-inflammatories (NSAID): Secondary | ICD-10-CM | POA: Diagnosis not present

## 2016-03-09 DIAGNOSIS — K449 Diaphragmatic hernia without obstruction or gangrene: Principal | ICD-10-CM | POA: Diagnosis present

## 2016-03-09 DIAGNOSIS — Z79899 Other long term (current) drug therapy: Secondary | ICD-10-CM | POA: Diagnosis not present

## 2016-03-09 DIAGNOSIS — I2510 Atherosclerotic heart disease of native coronary artery without angina pectoris: Secondary | ICD-10-CM | POA: Diagnosis present

## 2016-03-09 DIAGNOSIS — I10 Essential (primary) hypertension: Secondary | ICD-10-CM | POA: Diagnosis present

## 2016-03-09 HISTORY — PX: HIATAL HERNIA REPAIR: SHX195

## 2016-03-09 HISTORY — PX: UPPER GI ENDOSCOPY: SHX6162

## 2016-03-09 HISTORY — PX: NISSEN FUNDOPLICATION: SHX2091

## 2016-03-09 LAB — TYPE AND SCREEN
ABO/RH(D): A POS
Antibody Screen: NEGATIVE

## 2016-03-09 SURGERY — REPAIR, HERNIA, HIATAL, LAPAROSCOPIC
Anesthesia: General | Site: Abdomen

## 2016-03-09 MED ORDER — LACTATED RINGERS IR SOLN
Status: DC | PRN
  Administered 2016-03-09: 1000 mL

## 2016-03-09 MED ORDER — DEXAMETHASONE SODIUM PHOSPHATE 10 MG/ML IJ SOLN
INTRAMUSCULAR | Status: DC | PRN
  Administered 2016-03-09: 10 mg via INTRAVENOUS

## 2016-03-09 MED ORDER — FENTANYL CITRATE (PF) 100 MCG/2ML IJ SOLN
INTRAMUSCULAR | Status: AC
  Filled 2016-03-09: qty 2

## 2016-03-09 MED ORDER — MEPERIDINE HCL 50 MG/ML IJ SOLN: 6.2500 mg | INTRAMUSCULAR | Status: DC | PRN

## 2016-03-09 MED ORDER — MORPHINE SULFATE (PF) 2 MG/ML IV SOLN
2.0000 mg | INTRAVENOUS | Status: DC | PRN
  Administered 2016-03-09: 6 mg via INTRAVENOUS
  Administered 2016-03-09 – 2016-03-10 (×3): 4 mg via INTRAVENOUS
  Administered 2016-03-10 – 2016-03-11 (×7): 2 mg via INTRAVENOUS
  Filled 2016-03-09 (×7): qty 1
  Filled 2016-03-09: qty 2
  Filled 2016-03-09: qty 3
  Filled 2016-03-09: qty 1
  Filled 2016-03-09: qty 2
  Filled 2016-03-09: qty 1
  Filled 2016-03-09: qty 2

## 2016-03-09 MED ORDER — HYDROMORPHONE HCL 1 MG/ML IJ SOLN
0.2500 mg | INTRAMUSCULAR | Status: DC | PRN
  Administered 2016-03-09 (×3): 0.5 mg via INTRAVENOUS

## 2016-03-09 MED ORDER — DEXAMETHASONE SODIUM PHOSPHATE 10 MG/ML IJ SOLN
INTRAMUSCULAR | Status: AC
  Filled 2016-03-09: qty 1

## 2016-03-09 MED ORDER — KCL IN DEXTROSE-NACL 20-5-0.9 MEQ/L-%-% IV SOLN
INTRAVENOUS | Status: DC
  Administered 2016-03-09 – 2016-03-10 (×4): via INTRAVENOUS
  Filled 2016-03-09 (×3): qty 1000

## 2016-03-09 MED ORDER — KCL IN DEXTROSE-NACL 20-5-0.9 MEQ/L-%-% IV SOLN
INTRAVENOUS | Status: AC
  Administered 2016-03-09: 13:00:00
  Filled 2016-03-09: qty 1000

## 2016-03-09 MED ORDER — ONDANSETRON 4 MG PO TBDP: 4.0000 mg | ORAL_TABLET | Freq: Four times a day (QID) | ORAL | Status: DC | PRN

## 2016-03-09 MED ORDER — SUGAMMADEX SODIUM 200 MG/2ML IV SOLN
INTRAVENOUS | Status: AC
  Filled 2016-03-09: qty 4

## 2016-03-09 MED ORDER — ONDANSETRON HCL 4 MG/2ML IJ SOLN
4.0000 mg | INTRAMUSCULAR | Status: AC
  Administered 2016-03-09 – 2016-03-10 (×4): 4 mg via INTRAVENOUS
  Filled 2016-03-09 (×4): qty 2

## 2016-03-09 MED ORDER — ROCURONIUM BROMIDE 100 MG/10ML IV SOLN
INTRAVENOUS | Status: DC | PRN
  Administered 2016-03-09: 10 mg via INTRAVENOUS
  Administered 2016-03-09: 20 mg via INTRAVENOUS
  Administered 2016-03-09: 40 mg via INTRAVENOUS

## 2016-03-09 MED ORDER — ONDANSETRON HCL 4 MG/2ML IJ SOLN: 4.0000 mg | Freq: Once | INTRAMUSCULAR | Status: DC | PRN

## 2016-03-09 MED ORDER — ONDANSETRON HCL 4 MG/2ML IJ SOLN
INTRAMUSCULAR | Status: AC
  Filled 2016-03-09: qty 2

## 2016-03-09 MED ORDER — BUPIVACAINE HCL (PF) 0.5 % IJ SOLN
INTRAMUSCULAR | Status: AC
  Filled 2016-03-09: qty 30

## 2016-03-09 MED ORDER — EPHEDRINE SULFATE 50 MG/ML IJ SOLN
INTRAMUSCULAR | Status: AC
  Filled 2016-03-09: qty 1

## 2016-03-09 MED ORDER — PROPOFOL 10 MG/ML IV BOLUS
INTRAVENOUS | Status: AC
  Filled 2016-03-09: qty 20

## 2016-03-09 MED ORDER — ONDANSETRON HCL 4 MG/2ML IJ SOLN
INTRAMUSCULAR | Status: DC | PRN
  Administered 2016-03-09 (×2): 2 mg via INTRAVENOUS

## 2016-03-09 MED ORDER — BUPIVACAINE HCL (PF) 0.5 % IJ SOLN
INTRAMUSCULAR | Status: DC | PRN
  Administered 2016-03-09: 15 mL
  Administered 2016-03-09: 30 mL

## 2016-03-09 MED ORDER — 0.9 % SODIUM CHLORIDE (POUR BTL) OPTIME
TOPICAL | Status: DC | PRN
  Administered 2016-03-09: 1000 mL

## 2016-03-09 MED ORDER — MIDAZOLAM HCL 5 MG/5ML IJ SOLN
INTRAMUSCULAR | Status: DC | PRN
  Administered 2016-03-09 (×2): .25 mg via INTRAVENOUS

## 2016-03-09 MED ORDER — SUGAMMADEX SODIUM 500 MG/5ML IV SOLN
INTRAVENOUS | Status: DC | PRN
  Administered 2016-03-09: 400 mg via INTRAVENOUS

## 2016-03-09 MED ORDER — FAMOTIDINE IN NACL 20-0.9 MG/50ML-% IV SOLN
20.0000 mg | Freq: Two times a day (BID) | INTRAVENOUS | Status: DC
  Administered 2016-03-09 – 2016-03-11 (×4): 20 mg via INTRAVENOUS
  Filled 2016-03-09 (×7): qty 50

## 2016-03-09 MED ORDER — MIDAZOLAM HCL 2 MG/2ML IJ SOLN
INTRAMUSCULAR | Status: AC
  Filled 2016-03-09: qty 2

## 2016-03-09 MED ORDER — EPHEDRINE SULFATE 50 MG/ML IJ SOLN
INTRAMUSCULAR | Status: DC | PRN
  Administered 2016-03-09: 2.5 mg via INTRAVENOUS
  Administered 2016-03-09 (×4): 5 mg via INTRAVENOUS
  Administered 2016-03-09 (×2): 2.5 mg via INTRAVENOUS
  Administered 2016-03-09 (×2): 5 mg via INTRAVENOUS
  Administered 2016-03-09 (×3): 2.5 mg via INTRAVENOUS

## 2016-03-09 MED ORDER — NITROGLYCERIN 0.4 MG SL SUBL: 0.4000 mg | SUBLINGUAL_TABLET | SUBLINGUAL | Status: DC | PRN

## 2016-03-09 MED ORDER — HYDROMORPHONE HCL 1 MG/ML IJ SOLN
INTRAMUSCULAR | Status: AC
  Administered 2016-03-09: 0.5 mg via INTRAVENOUS
  Filled 2016-03-09: qty 1

## 2016-03-09 MED ORDER — HYDROMORPHONE HCL 1 MG/ML IJ SOLN
0.2500 mg | INTRAMUSCULAR | Status: DC | PRN
  Administered 2016-03-09: 0.5 mg via INTRAVENOUS

## 2016-03-09 MED ORDER — CETYLPYRIDINIUM CHLORIDE 0.05 % MT LIQD
7.0000 mL | Freq: Two times a day (BID) | OROMUCOSAL | Status: DC
  Administered 2016-03-09 – 2016-03-11 (×4): 7 mL via OROMUCOSAL

## 2016-03-09 MED ORDER — LIDOCAINE HCL (CARDIAC) 20 MG/ML IV SOLN
INTRAVENOUS | Status: DC | PRN
  Administered 2016-03-09: 50 mg via INTRAVENOUS

## 2016-03-09 MED ORDER — SODIUM CHLORIDE 0.9 % IJ SOLN
INTRAMUSCULAR | Status: AC
  Filled 2016-03-09: qty 10

## 2016-03-09 MED ORDER — GLYCOPYRROLATE 0.2 MG/ML IJ SOLN
INTRAMUSCULAR | Status: AC
  Filled 2016-03-09: qty 1

## 2016-03-09 MED ORDER — ATROPINE SULFATE 0.4 MG/ML IJ SOLN
INTRAMUSCULAR | Status: AC
  Filled 2016-03-09: qty 1

## 2016-03-09 MED ORDER — HYDRALAZINE HCL 20 MG/ML IJ SOLN: 20.0000 mg | INTRAMUSCULAR | Status: DC | PRN

## 2016-03-09 MED ORDER — CEFAZOLIN SODIUM-DEXTROSE 2-4 GM/100ML-% IV SOLN
INTRAVENOUS | Status: AC
  Filled 2016-03-09: qty 100

## 2016-03-09 MED ORDER — PROMETHAZINE HCL 25 MG/ML IJ SOLN: 6.2500 mg | INTRAMUSCULAR | Status: DC | PRN

## 2016-03-09 MED ORDER — SUCCINYLCHOLINE CHLORIDE 20 MG/ML IJ SOLN
INTRAMUSCULAR | Status: DC | PRN
  Administered 2016-03-09: 100 mg via INTRAVENOUS

## 2016-03-09 MED ORDER — CEFAZOLIN SODIUM-DEXTROSE 2-4 GM/100ML-% IV SOLN
2.0000 g | INTRAVENOUS | Status: AC
  Administered 2016-03-09: 2 g via INTRAVENOUS
  Filled 2016-03-09: qty 100

## 2016-03-09 MED ORDER — CEFAZOLIN SODIUM-DEXTROSE 2-4 GM/100ML-% IV SOLN
2.0000 g | Freq: Three times a day (TID) | INTRAVENOUS | Status: AC
  Administered 2016-03-09: 2 g via INTRAVENOUS
  Filled 2016-03-09: qty 100

## 2016-03-09 MED ORDER — FENTANYL CITRATE (PF) 100 MCG/2ML IJ SOLN
INTRAMUSCULAR | Status: DC | PRN
  Administered 2016-03-09: 50 ug via INTRAVENOUS
  Administered 2016-03-09 (×2): 25 ug via INTRAVENOUS

## 2016-03-09 MED ORDER — LACTATED RINGERS IV SOLN
INTRAVENOUS | Status: DC | PRN
  Administered 2016-03-09 (×3): via INTRAVENOUS

## 2016-03-09 MED ORDER — ONDANSETRON HCL 4 MG/2ML IJ SOLN: 4.0000 mg | INTRAMUSCULAR | Status: DC | PRN

## 2016-03-09 MED ORDER — PROPOFOL 10 MG/ML IV BOLUS
INTRAVENOUS | Status: DC | PRN
  Administered 2016-03-09: 100 mg via INTRAVENOUS

## 2016-03-09 MED ORDER — LEVOTHYROXINE SODIUM 100 MCG IV SOLR
37.5000 ug | Freq: Every day | INTRAVENOUS | Status: DC
  Administered 2016-03-10 – 2016-03-11 (×2): 37.5 ug via INTRAVENOUS
  Filled 2016-03-09 (×2): qty 5

## 2016-03-09 MED ORDER — LIDOCAINE HCL (CARDIAC) 20 MG/ML IV SOLN
INTRAVENOUS | Status: AC
Start: 2016-03-09 — End: 2016-03-09
  Filled 2016-03-09: qty 5

## 2016-03-09 SURGICAL SUPPLY — 64 items
APL SKNCLS STERI-STRIP NONHPOA (GAUZE/BANDAGES/DRESSINGS) ×3
APPLIER CLIP ROT 10 11.4 M/L (STAPLE)
APR CLP MED LRG 11.4X10 (STAPLE)
BENZOIN TINCTURE PRP APPL 2/3 (GAUZE/BANDAGES/DRESSINGS) ×4 IMPLANT
CLAMP ENDO BABCK 10MM (STAPLE) ×4 IMPLANT
CLIP APPLIE ROT 10 11.4 M/L (STAPLE) IMPLANT
COVER SURGICAL LIGHT HANDLE (MISCELLANEOUS) ×2 IMPLANT
DECANTER SPIKE VIAL GLASS SM (MISCELLANEOUS) ×2 IMPLANT
DEVICE SUT QUICK LOAD TK 5 (STAPLE) ×21 IMPLANT
DEVICE SUT TI-KNOT TK 5X26 (MISCELLANEOUS) ×1 IMPLANT
DEVICE SUTURE ENDOST 10MM (ENDOMECHANICALS) ×4 IMPLANT
DISSECTOR BLUNT TIP ENDO 5MM (MISCELLANEOUS) ×4 IMPLANT
DRAIN PENROSE 18X1/2 LTX STRL (DRAIN) ×4 IMPLANT
DRAPE LAPAROSCOPIC ABDOMINAL (DRAPES) ×4 IMPLANT
DRSG TEGADERM 2-3/8X2-3/4 SM (GAUZE/BANDAGES/DRESSINGS) ×24 IMPLANT
DRSG TEGADERM 4X4.75 (GAUZE/BANDAGES/DRESSINGS) ×19 IMPLANT
ELECT PENCIL ROCKER SW 15FT (MISCELLANEOUS) IMPLANT
ELECT REM PT RETURN 9FT ADLT (ELECTROSURGICAL) ×4
ELECTRODE REM PT RTRN 9FT ADLT (ELECTROSURGICAL) ×3 IMPLANT
FELT TEFLON 4 X1 (Mesh General) ×3 IMPLANT
GAUZE SPONGE 2X2 8PLY STRL LF (GAUZE/BANDAGES/DRESSINGS) ×3 IMPLANT
GLOVE BIOGEL PI IND STRL 7.0 (GLOVE) ×13 IMPLANT
GLOVE BIOGEL PI INDICATOR 7.0 (GLOVE) ×6
GLOVE ECLIPSE 8.0 STRL XLNG CF (GLOVE) ×4 IMPLANT
GLOVE INDICATOR 8.0 STRL GRN (GLOVE) ×8 IMPLANT
GOWN STRL REUS W/TWL LRG LVL3 (GOWN DISPOSABLE) ×4 IMPLANT
GOWN STRL REUS W/TWL XL LVL3 (GOWN DISPOSABLE) ×12 IMPLANT
GRASPER ENDO BABCOCK 10 (MISCELLANEOUS) IMPLANT
GRASPER ENDO BABCOCK 10MM (MISCELLANEOUS)
IRRIG SUCT STRYKERFLOW 2 WTIP (MISCELLANEOUS) ×4
IRRIGATION SUCT STRKRFLW 2 WTP (MISCELLANEOUS) ×3 IMPLANT
KIT BASIN OR (CUSTOM PROCEDURE TRAY) ×4 IMPLANT
KIT DEFENDO BUTTON (KITS) ×1 IMPLANT
NS IRRIG 1000ML POUR BTL (IV SOLUTION) ×4 IMPLANT
PAD POSITIONING PINK XL (MISCELLANEOUS) ×1 IMPLANT
POSITIONER SURGICAL ARM (MISCELLANEOUS) IMPLANT
RETRACTOR LAPSCP 12X46 CVD (ENDOMECHANICALS) IMPLANT
RTRCTR LAPSCP 12X46 CVD (ENDOMECHANICALS)
SHEARS HARMONIC ACE PLUS 36CM (ENDOMECHANICALS) ×4 IMPLANT
SLEEVE XCEL OPT CAN 5 100 (ENDOMECHANICALS) ×8 IMPLANT
SOLUTION ANTI FOG 6CC (MISCELLANEOUS) ×4 IMPLANT
SPONGE GAUZE 2X2 STER 10/PKG (GAUZE/BANDAGES/DRESSINGS) ×1
STAPLER VISISTAT 35W (STAPLE) ×3 IMPLANT
STRIP CLOSURE SKIN 1/2X4 (GAUZE/BANDAGES/DRESSINGS) ×4 IMPLANT
SUT MNCRL AB 4-0 PS2 18 (SUTURE) ×4 IMPLANT
SUT SURGIDAC NAB ES-9 0 48 120 (SUTURE) ×22 IMPLANT
SYRINGE 60CC LL (MISCELLANEOUS) ×2 IMPLANT
TAPE CLOTH 4X10 WHT NS (GAUZE/BANDAGES/DRESSINGS) IMPLANT
TIP INNERVISION DETACH 40FR (MISCELLANEOUS) IMPLANT
TIP INNERVISION DETACH 50FR (MISCELLANEOUS) IMPLANT
TIP INNERVISION DETACH 56FR (MISCELLANEOUS) IMPLANT
TIPS INNERVISION DETACH 40FR (MISCELLANEOUS)
TOWEL OR 17X26 10 PK STRL BLUE (TOWEL DISPOSABLE) ×4 IMPLANT
TRAY FOLEY W/METER SILVER 14FR (SET/KITS/TRAYS/PACK) ×4 IMPLANT
TRAY FOLEY W/METER SILVER 16FR (SET/KITS/TRAYS/PACK) ×1 IMPLANT
TRAY LAPAROSCOPIC (CUSTOM PROCEDURE TRAY) ×4 IMPLANT
TROCAR BLADELESS OPT 5 100 (ENDOMECHANICALS) ×1 IMPLANT
TROCAR BLADELESS OPT 5 75 (ENDOMECHANICALS) ×3 IMPLANT
TROCAR XCEL 12X100 BLDLESS (ENDOMECHANICALS) IMPLANT
TROCAR XCEL NON-BLD 11X100MML (ENDOMECHANICALS) ×4 IMPLANT
TROCAR XCEL UNIV SLVE 11M 100M (ENDOMECHANICALS) ×8 IMPLANT
TUBING CONNECTING 10 (TUBING) ×1 IMPLANT
TUBING ENDO SMARTCAP PENTAX (MISCELLANEOUS) ×1 IMPLANT
TUBING INSUF HEATED (TUBING) ×4 IMPLANT

## 2016-03-09 NOTE — Care Management Obs Status (Signed)
[facility] [place] NOTIFICATION   Patient Details  Name: Cindy Simpson MRN: [id number] Date of Birth: December 26, 1945   Medicare Observation Status Notification Given:  Yes    Guadalupe Maple, RN 03/09/2016, 2:51 PM

## 2016-03-09 NOTE — Op Note (Signed)
Operative Note  Cindy Simpson female 70 y.o. 03/09/2016  PREOPERATIVE DX:  Symptomatic large hiatal hernia with gastroesophageal reflux disease  POSTOPERATIVE DX:  Same  PROCEDURE:   1. Laparoscopic hiatal hernia repair. 2. Laparoscopic Nissen fundoplication (over a 56 French dilator) . 3. Upper endoscopy (by Dr. Kaylyn Lim).         Surgeon: Odis Hollingshead   Assistants: Kaylyn Lim M.D.  Anesthesia: General endotracheal anesthesia  Indications:   This is a 70 year old female who has a symptomatic hiatal hernia and gastroesophageal reflux. Approximately 50% of her stomach is in her chest. She said a thorough  Preoperative evaluation. She now presents for the above procedure.    Procedure Detail:  She was brought to the operating room and placed supine on the operating table and a general anesthetic was given. A Foley catheter was inserted. An oral gastric tube was inserted. The abdominal wall was widely sterilely prepped and draped. A timeout was performed.  She was placed in slight reverse Trendelenburg position. A 5 mm incision was made in the left subcostal area.  Using a 5 mm Optiview trocar and laparoscope,  Access was gained into the peritoneal cavity and a pneumoperitoneum created by insufflation of carbon dioxide gas. Inspection of the area underneath the trocar demonstrated no evidence of organ injury or bleeding.  A 5 mm trocar was placed just to the left of the umbilicus.  A 5 mm trocar was placed in the right upper quadrant. An 11 mm trocar was placed in the epigastrium.  A 5 mm trocar was placed  In the left lateral abdomen.  A 5 mm incision was made in the upper epigastrium just to the left of midline. A self retaining liver retractor was placed into the abdominal cavity. The left lobe of the liver was then retracted anteriorly exposing a large hiatal hernia.   The thin gastrohepatic ligament was divided proximally up toward the right crus.  I then began dissecting  the sac free from the right crus anterior crus and left crus using blunt dissection and the Harmonic scalpel. Then I dissected the sac free from the posterior crural area and was able to the reduce the stomach back into the abdominal cavity.  I then carefully mobilized the esophagus by dividing thin filmy lateral adhesions as far proximally as I could. There was no evidence of injury to the stomach or the esophagus. I was able to reduce the stomach so that gastroesophageal junction was just below the diaphragm.  At this time, Dr. Hassell Done performed upper endoscopy confirming the position of the gastroesophageal junction.  During the endoscopy, there was no evidence of injury to the esophagus or stomach.   I was unable to mobilize the esophagus any further to allow for further reduction of the GE junction into abdominal cavity.  A retroesophageal window was created using blunt dissection and a Penrose drain was placed through the window and used to retract the GE junction.  I then debrided the sac free from the stomach using the harmonic scalpel. Short gastric vessels of the fundus were divided mobilizing the fundus. The hiatal hernia defect was then closed with interrupted size 0 non-absorbable pledgeted sutures. When adequate closure was achieved, the penrose drain was removed.  The fundus of the stomach was then passed posterior to the esophagus. A size 56 dilator was then passed into the stomach. A [id number],  2.5 cm fundoplication was then performed with interrupted size 0 non-absorbable sutures. The dilator was removed and  was intact. The wrap was floppy and under no tension.  The abdominal cavity was then inspected in all quadrants. There is no evidence of bleeding or organ injury. The liver retractor was removed. The pneumoperitoneum was released and the trocars were removed.  The skin incisions were closed with 4-0 Monocryl subcuticular stitches. Steri-Strips and sterile dressings were applied.  She  tolerated the procedure well without any apparent complications. She was taken to the recovery room in satisfactory condition.   Estimated Blood Loss:  150 ml         Specimens: none        Complications:  * No complications entered in OR log *         Disposition: PACU - hemodynamically stable.         Condition: stable

## 2016-03-09 NOTE — Anesthesia Procedure Notes (Signed)
Procedure Name: Intubation Date/Time: 03/09/2016 7:31 AM Performed by: Jillyn Hidden Pre-anesthesia Checklist: Patient identified, Timeout performed, Emergency Drugs available, Suction available and Patient being monitored Patient Re-evaluated:Patient Re-evaluated prior to inductionOxygen Delivery Method: Circle system utilized Preoxygenation: Pre-oxygenation with 100% oxygen Intubation Type: Rapid sequence Laryngoscope Size: Mac and 3 Grade View: Grade I Tube type: Oral Tube size: 7.5 mm Number of attempts: 1 Airway Equipment and Method: Stylet Placement Confirmation: ETT inserted through vocal cords under direct vision,  positive ETCO2 and breath sounds checked- equal and bilateral Secured at: 21 cm Tube secured with: Tape Dental Injury: Teeth and Oropharynx as per pre-operative assessment

## 2016-03-09 NOTE — Transfer of Care (Signed)
Immediate Anesthesia Transfer of Care Note  Patient: Cindy Simpson  Procedure(s) Performed: Procedure(s): LAPAROSCOPIC REPAIR OF HIATAL HERNIA  NISSEN FUNDOPLICATION UPPER ENDOSCOPY (N/A) NISSEN FUNDOPLICATION (N/A) UPPER GI ENDOSCOPY  Patient Location: PACU  Anesthesia Type:General  Level of Consciousness: awake, oriented, patient cooperative, lethargic and responds to stimulation  Airway & Oxygen Therapy: Patient Spontanous Breathing and Patient connected to face mask oxygen  Post-op Assessment: Report given to RN, Post -op Vital signs reviewed and stable and Patient moving all extremities  Post vital signs: Reviewed and stable  Last Vitals:  Vitals:   03/09/16 1010 03/09/16 1015  BP: 117/63   Pulse: 77 74  Resp: 12 11  Temp: 36.3 C     Last Pain:  Vitals:   03/09/16 0512  TempSrc: Oral         Complications: No apparent anesthesia complications

## 2016-03-09 NOTE — H&P (Signed)
H & P   Cindy Simpson  DOB: Mar 19, 1946 Married / Language: English / Race: White Female   History of Present Illness  The patient is a 70 year old female.  Note:She was referred by Dr. Myrtie Neither for consultation regarding a symptomatic large hiatal hernia. She has a long-standing hiatal hernia and has a history of some intermittent dysphagia as well as regurgitation and heartburn symptoms. She states that after she eats, she gets full and then feels like she is short of breath. She only eats 2 meals a day and her husband says she eats quite a bit at a time. She saw Dr. Myrtie Neither in an upper GI was ordered which demonstrated a large hiatal hernia with 50% of her stomach up in her chest and some evidence of esophageal dysmotility. No stricture. Upper endoscopy was performed which demonstrated the hiatal hernia as well. She is here to discuss surgery for the hiatal hernia. Manometry not able to be done.  Dr. Sharyn Lull feels she is acceptable risk from a cardiac standpoint.  Other Problems  Bladder Problems Gastroesophageal Reflux Disease Hypercholesterolemia Myocardial infarction Oophorectomy Bilateral. Thyroid Disease  Past Surgical History Cataract Surgery Bilateral. Hysterectomy (not due to cancer) - Complete Oral Surgery   Allergies Lamar Laundry Bynum, CMA; 01/13/2016 3:46 PM) Lisinopril *ANTIHYPERTENSIVES* Acetic Acid *CHEMICALS* Metoclopramide HCl *GASTROINTESTINAL AGENTS - MISC.*  Prior to Admission medications   Medication Sig Start Date End Date Taking? Authorizing Provider  acetaminophen (TYLENOL) 500 MG tablet Take 1,000 mg by mouth daily as needed for mild pain.   Yes Historical Provider, MD  aspirin EC 81 MG EC tablet Take 1 tablet (81 mg total) by mouth daily. Patient taking differently: Take 81 mg by mouth at bedtime.  09/14/15  Yes Rinaldo Cloud, MD  calcium carbonate (TUMS - DOSED IN MG ELEMENTAL CALCIUM) 500 MG chewable tablet Chew 1-2 tablets by  mouth daily as needed for indigestion or heartburn.   Yes Historical Provider, MD  carvedilol (COREG) 3.125 MG tablet Take 3.125 mg by mouth 2 (two) times daily. 12/24/15  Yes Historical Provider, MD  Cholecalciferol 1000 UNITS tablet Take 1,000 Units by mouth daily after supper.  10/13/10  Yes [name], MD  diphenhydrAMINE (BENADRYL) 50 MG capsule Take 50 mg by mouth at bedtime.   Yes Historical Provider, MD  docusate sodium (COLACE) 100 MG capsule Take 100 mg by mouth 2 (two) times daily after a meal.   Yes Historical Provider, MD  IRON PO Take 1 tablet by mouth daily after supper.    Yes Historical Provider, MD  KRILL OIL PO Take 1 capsule by mouth daily after supper.    Yes Historical Provider, MD  levothyroxine (SYNTHROID, LEVOTHROID) 75 MCG tablet Take 1 tablet by mouth  daily Patient taking differently: Take 1 tablet by mouth daily at bedtime. 01/27/16  Yes [name], MD  loratadine (CLARITIN) 10 MG tablet Take 10 mg by mouth daily after supper.    Yes Historical Provider, MD  lovastatin (MEVACOR) 10 MG tablet Take 1 tablet by mouth  daily 01/27/16  Yes Tresa Garter, MD  Multiple Vitamin (MULTIVITAMIN WITH MINERALS) TABS tablet Take 1 tablet by mouth daily.   Yes Historical Provider, MD  naproxen sodium (ALEVE) 220 MG tablet Take 220 mg by mouth daily.    Yes Historical Provider, MD  nitroGLYCERIN (NITROSTAT) 0.4 MG SL tablet Place 1 tablet (0.4 mg total) under the tongue every 5 (five) minutes x 3 doses as needed for chest pain.  09/14/15  Yes Rinaldo Cloud, MD  pantoprazole (PROTONIX) 40 MG tablet Take 1 tablet (40 mg total) by mouth 2 (two) times daily. Patient taking differently: Take 40 mg by mouth 2 (two) times daily before a meal.  12/31/15  Yes [name], MD  valsartan (DIOVAN) 160 MG tablet Take 1 tablet (160 mg total) by mouth daily. Patient taking differently: Take 160 mg by mouth at bedtime.  12/01/15  Yes Tresa Garter, MD    Social History  Gilmer Mor, CMA; 01/13/2016 3:45 PM) Alcohol use Occasional alcohol use. Caffeine use Coffee, Tea. No drug use Tobacco use Never smoker.  Family History Gilmer Mor, CMA; 01/13/2016 3:45 PM) Depression Sister. Diabetes Mellitus Family Members In General. Heart Disease Father, Sister. Heart disease in female family member before age 54 Heart disease in female family member before age 22 Ischemic Bowel Disease Sister. Respiratory Condition Sister.   Review of Systems General Not Present- Appetite Loss, Chills, Fatigue, Fever, Night Sweats, Weight Gain and Weight Loss. Skin Not Present- Change in Wart/Mole, Dryness, Hives, Jaundice, New Lesions, Non-Healing Wounds, Rash and Ulcer. HEENT Present- Ringing in the Ears, Seasonal Allergies and Wears glasses/contact lenses. Not Present- Earache, Hearing Loss, Hoarseness, Nose Bleed, Oral Ulcers, Sinus Pain, Sore Throat, Visual Disturbances and Yellow Eyes. Respiratory Present- Chronic Cough and Snoring. Not Present- Bloody sputum, Difficulty Breathing and Wheezing. Breast Not Present- Breast Mass, Breast Pain, Nipple Discharge and Skin Changes. Cardiovascular Not Present- Chest Pain, Difficulty Breathing Lying Down, Leg Cramps, Palpitations, Rapid Heart Rate, Shortness of Breath and Swelling of Extremities. Gastrointestinal Present- Constipation, Difficulty Swallowing and Indigestion. Not Present- Abdominal Pain, Bloating, Bloody Stool, Change in Bowel Habits, Chronic diarrhea, Excessive gas, Gets full quickly at meals, Hemorrhoids, Nausea, Rectal Pain and Vomiting. Musculoskeletal Present- Back Pain and Joint Stiffness. Not Present- Joint Pain, Muscle Pain, Muscle Weakness and Swelling of Extremities. Psychiatric Not Present- Anxiety, Bipolar, Change in Sleep Pattern, Depression, Fearful and Frequent crying. Endocrine Not Present- Cold Intolerance, Excessive Hunger, Hair Changes, Heat Intolerance, Hot flashes and New  Diabetes. Hematology Not Present- Blood Thinners, Easy Bruising, Excessive bleeding, Gland problems, HIV and Persistent Infections.   Physical Exam  The physical exam findings are as follows: Note:General: Overweight female in NAD. Pleasant and cooperative. Her husband is with her.  HEENT: [place]/AT, no external nasal or ear masses, mucous membranes are moist  EYES: EOMI, no scleral icterus, pupils normal  NECK: Supple, no obvious mass or thyroid mass/enlargement, no trachea deviation  CV: RRR, no murmur, no edema  CHEST: Breath sounds equal and clear. Respirations nonlabored.  ABDOMEN: Soft, nontender, nondistended, no masses, no organomegaly, active bowel sounds, no scars, no hernias.  MUSCULOSKELETAL: FROM, good muscle tone, small lower extremity varicosities, normal station and gait  LYMPHATIC: No palpable cervical, supraclavicular adenopathy.  SKIN: No jaundice or suspicious rashes.  NEUROLOGIC: Alert and oriented, answers questions appropriately, normal gait and station.  PSYCHIATRIC: Normal mood, affect , and behavior.    Assessment & Plan Adolph Pollack MD; 01/13/2016 5:03 PM) LARGE HIATAL HERNIA (K44.9) Impression: Has some evidence of esophageal dysmotility on esophagram. Also has gastroesophageal reflux symptoms. She is interested in having surgery at this time. We had a long discussion about the operation as well as lifestyle changes needed to try to decrease the risk of recurrence which, in her case, in the range of 10-40%.  Plan: Schedule laparoscopic hiatal hernia repair and Nissen fundoplication. I have explained the procedure, risks, and aftercare. Risks include but are not limited  to bleeding, infection, wound problems, anesthesia, dysphagia, injury to esophagus/stomach/liver/spleen/intestine,gas bloat, diarrhea, failure to resolve symptoms,recurrent hernia. She seems to understand and agrees with the plan.  Strict dietary restriction were  explained as well as changes in lifestyle.  Avel Peace, MD

## 2016-03-09 NOTE — Care Management CC44 (Signed)
Condition Code 44 Documentation Completed  Patient Details  Name: Cindy Simpson MRN: [id number] Date of Birth: 03-15-1946   Condition Code 44 given:  Yes Patient signature on Condition Code 44 notice:  Yes Documentation of 2 MD's agreement:  Yes Code 44 added to claim:  Yes    Guadalupe Maple, RN 03/09/2016, 2:51 PM

## 2016-03-09 NOTE — Telephone Encounter (Signed)
Tammy, Pls check w/Stacy how we order it - I ordered it via Epic Thx

## 2016-03-09 NOTE — Anesthesia Postprocedure Evaluation (Signed)
Anesthesia Post Note  Patient: Cindy Simpson  Procedure(s) Performed: Procedure(s) (LRB): LAPAROSCOPIC REPAIR OF HIATAL HERNIA  NISSEN FUNDOPLICATION UPPER ENDOSCOPY (N/A) NISSEN FUNDOPLICATION (N/A) UPPER GI ENDOSCOPY  Patient location during evaluation: PACU Anesthesia Type: General Level of consciousness: awake and alert Pain management: pain level controlled Vital Signs Assessment: post-procedure vital signs reviewed and stable Respiratory status: spontaneous breathing, nonlabored ventilation, respiratory function stable and patient connected to nasal cannula oxygen Cardiovascular status: blood pressure returned to baseline and stable Postop Assessment: no signs of nausea or vomiting Anesthetic complications: no Comments: She does have facial crepitus and more prominent over right neck and over right face and eye,, keep her elevated at 30 degrees and will get better.. She also has a somewhat loose but mostly stable left lower tooth, difficult to say if different from baseline.. Primary team aware of these findings and they will follow and let us know if Dental consult needed and we will provide assistance in coordinating     Last Vitals:  Vitals:   03/09/16 1015 03/09/16 1030  BP:  (!) 124/109  Pulse: 74 74  Resp: 11 14  Temp:      Last Pain:  Vitals:   03/09/16 1010  TempSrc:   PainSc: (P) 0-No pain                 Zenaida Deed

## 2016-03-09 NOTE — Discharge Instructions (Addendum)
[facility]   INSTRUCTIONS AFTER YOUR ESOPHAGEAL SURGERY  No lifting, pushing, or pulling over 10 pounds for 8 weeks. You may shower.  Remove bandages 3 days after your surgery. Stay on a level I diet which is basically liquids and pured type food. Due not overeat. Walk frequently. Do not drink sodas or use straws. Take a laxative of your choice as needed. Call for high fever (greater than 101.5),  persistent vomiting, wound problems, inability to swallow.  After your esophageal surgery, you can expect some difficulty swallowing.  If food sticks when you eat, it is called "dysphagia".  This is due to swelling around your surgery site and will most likely resolve within a few weeks.  To help you through this temporary phase, we start you out on a pureed diet.  Your first meal in the hospital was clear liquids.  You should have been given a pureed diet by the time you left the hospital.  We ask patients to stay on a pureed diet for the first two weeks to avoid anything getting "stuck" near your recent surgery.  Don't be alarmed if your ability to swallow doesn't progress according to this plan.  Everyone is different and some take longer or shorter.  Use common sense.  If you are having trouble swallowing a particular food, then avoid it.  If food is sticking when you advance your diet, go back to the previous day or two.  In general some simple rules to follow are:  Maintain an upright position (as near 90 degrees as possible) whenever eating or drinking.  Take small bites - only 1/2 to 1 teaspoon at a time.  Eat slowly.  It may also help to eat only one food at a time.  Avoid talking while eating.  Do not mix solid foods and liquids in the same mouthful and do not "wash foods down" with liquids, unless you have been instructed to do so by your surgeon.  Eat in a relaxed atmosphere, with no distractions.  Following each meal, sit in an upright position (90 degree  angle) for 60 minutes.  Avoid carbonated (bubbly) drinks.  If food does stick, don't panic.  Try to relax and let the food pass on its own.  Sipping strong hot black tea can also help.  If you have any questions please call our office at [phone].   LEVEL 1 PUREED FOODS:  1ST 2 WEEKS AFTER SURGERY Foods in this group are pureed or blenderized to a smooth, mashed potato-like consistency.  If necessary, the pureed foods can keep their shape with the addition of a thickening agent.  Meat should be pureed to a smooth pasty consistency.  Hot broth or gravy may be added to the pureed meat, approximately 1 oz. of liquid per 3 oz. serving of meat. CAUTION:  If any foods do not puree into a smooth consistency, it may make eating for swallowing more difficult.  For example, zucchini seeds sometimes do not blend well. Hot Foods Cold Foods  Pureed scrambled eggs and cheese Pureed cottage cheese  Baby cereals Thickened juices and nectars  Thinned cooked cereals (no lumps) Thickened milk or eggnog  Pureed [place] toast or pancakes Ensure  Mashed potatoes Ice cream  Pureed parsley, au gratin, scalloped potatoes, candied sweet potatoes Fruit or [place] ice, sherbet  Pureed buttered or alfredo noodles Plain yogurt  Pureed vegetables (no corn or peas) Instant breakfast  Pureed soups and creamed soups Smooth pudding, mousse, custard  Pureed scalloped apples Whipped gelatin  Gravies Sugar, syrup, honey, jelly  Sauces, cheese, tomato, barbecue, white, creamed Cream  Any baby food Creamer  Alcohol in moderation (not beer or champagne) Margarine  Coffee or tea Mayonnaise   Ketchup, mustard   Apple sauce   SAMPLE MENU:  PUREED DIET Breakfast Lunch Dinner   Orange juice, 1/2 cup  Cream of wheat, 1/2 cup  Pineapple juice, 1/2 cup  Pureed [place], barley soup, 3/4 cup  Pureed Hawaiian chicken, 3 oz   Scrambled eggs, mashed or blended with cheese, 1/2 cup  Tea or coffee, 1 cup   Whole milk, 1  cup   Non-dairy creamer, 2 Tbsp.  Mashed potatoes, 1/2 cup  Pureed cooled broccoli, 1/2 cup  Apple sauce, 1/2 cup  Coffee or tea  Mashed potatoes, 1/2 cup  Pureed spinach, 1/2 cup  Frozen yogurt, 1/2 cup  Tea or coffee    LEVEL 2 After your first 2 weeks, you can advance to a soft diet.  Keep on this diet until everything goes down easily. Hot Foods Cold Foods  White fish Cottage cheese  Stuffed fish Junior baby fruit  Baby food meals Semi thickened juices  Minced soft cooked, scrambled, poached eggs nectars  Souffle & omelets Ripe mashed bananas  Cooked cereals Canned fruit, pineapple sauce, milk  potatoes Milkshake  Buttered or Alfredo noodles Custard  Cooked cooled vegetable Puddings, including tapioca  Sherbet Yogurt  Vegetable soup or alphabet soup Fruit ice, [place] ice  Gravies Whipped gelatin  Sugar, syrup, honey, jelly Junior baby desserts  Sauces:  Cheese, creamed, barbecue, tomato, white Cream  Coffee or tea Margarine   SAMPLE MENU:  LEVEL 2 Breakfast Lunch Dinner   Orange juice, 1/2 cup  Oatmeal, 1/2 cup  Scrambled eggs with cheese, 1/2 cup  Decaffeinated tea, 1 cup  Whole milk, 1 cup  Non-dairy creamer, 2 Tbsp  Pineapple juice, 1/2 cup  Minced beef, 3 oz  Gravy, 2 Tbsp  Mashed potatoes, 1/2 cup  Minced fresh broccoli, 1/2 cup  Applesauce, 1/2 cup  Coffee, 1 cup  [place], barley soup, 3/4 cup  Minced Hawaiian chicken, 3 oz  Mashed potatoes, 1/2 cup  Cooked spinach, 1/2 cup  Frozen yogurt, 1/2 cup  Non-dairy creamer, 2 Tbsp    LEVEL 3 After all the foods in level 2 (soft diet) are passing through well you should advance up to the next level.  It is still important to cut these foods into small pieces and eat slowly. Hot Foods Cold Foods  Poultry Cottage cheese  Chopped Swedish meatballs Yogurt  Meat salads (ground or flaked meat) Milk  Flaked fish (tuna) Milkshakes  Poached or scrambled eggs Soft, cold, dry cereal    Souffles and omelets Fruit juices or nectars  Cooked cereals Chopped canned fruit  Chopped [place] toast or pancakes Canned fruit cocktail  Noodles or pasta (no rice) Pudding, mousse, custard  Cooked vegetables (no frozen peas, corn, or mixed vegetables) Green salad  Canned small sweet peas Ice cream  Creamed soup or vegetable soup Fruit ice, [place] ice  Pureed vegetable soup or alphabet soup Non-dairy creamer  Ground scalloped apples Margarine  Gravies Mayonnaise  Sauces:  Cheese, creamed, barbecue, tomato, white Ketchup  Coffee or tea Mustard   SAMPLE MENU:  LEVEL 3 Breakfast Lunch Dinner   Orange juice, 1/2 cup  Oatmeal, 1/2 cup  Scrambled eggs with cheese, 1/2 cup  Decaffeinated tea, 1 cup  Whole milk, 1 cup  Non-dairy creamer, 2  Tbsp  Ketchup, 1 Tbsp  Margarine, 1 tsp  Salt, 1/4 tsp  Sugar, 2 tsp  Pineapple juice, 1/2 cup  Ground beef, 3 oz  Gravy, 2 Tbsp  Mashed potatoes, 1/2 cup  Cooked spinach, 1/2 cup  Applesauce, 1/2 cup  Decaffeinated coffee  Whole milk  Non-dairy creamer, 2 Tbsp  Margarine, 1 tsp  Salt, 1/4 tsp  Pureed [place], barley soup, 3/4 cup  Barbecue chicken, 3 oz  Mashed potatoes, 1/2 cup  Ground fresh broccoli, 1/2 cup  Frozen yogurt, 1/2 cup  Decaffeinated tea, 1 cup  Non-dairy creamer, 2 Tbsp  Margarine, 1 tsp  Salt, 1/4 tsp  Sugar, 1 tsp    LEVEL 4:  REGULAR FOODS Foods in this group are soft, moist, regularly textured foods.  This level includes red meat and breads, which tend to be the hardest things to swallow.  Eat very slow, chew well and continue to avoid carbonated drinks. Hot Foods Cold Foods  Baked fish or skinned Soft cheeses - cottage cheese  Souffles and omelets Cream cheese  Eggs Yogurt  Stuffed shells Milk  Spaghetti with meat sauce Milkshakes  Cooked cereal Cold dry cereals (no nuts, dried fruit, coconut)  [place] toast or pancakes Crackers  Buttered toast Fruit juices or nectars  Noodles  or pasta (no rice) Canned fruit  Potatoes (all types) Ripe bananas  Soft, cooked vegetables (no corn, lima, or baked beans) Peeled, ripe, fresh fruit  Creamed soups or vegetable soup Cakes (no nuts, dried fruit, coconut)  Canned chicken noodle soup Plain doughnuts  Gravies Ice cream  Bacon dressing Pudding, mousse, custard  Sauces:  Cheese, creamed, barbecue, tomato, white Fruit ice, [place] ice, sherbet  Decaffeinated tea or coffee Whipped gelatin  Pork chops Regular gelatin   Canned fruited gelatin molds   Sugar, syrup, honey, jam, jelly   Cream   Non-dairy   Margarine   Oil   Mayonnaise   Ketchup   Mustard

## 2016-03-10 ENCOUNTER — Observation Stay (HOSPITAL_COMMUNITY): Payer: Medicare Other

## 2016-03-10 ENCOUNTER — Encounter (HOSPITAL_COMMUNITY): Payer: Self-pay | Admitting: General Surgery

## 2016-03-10 ENCOUNTER — Encounter (HOSPITAL_COMMUNITY): Payer: Medicare Other

## 2016-03-10 DIAGNOSIS — Z79899 Other long term (current) drug therapy: Secondary | ICD-10-CM | POA: Diagnosis not present

## 2016-03-10 DIAGNOSIS — Z8249 Family history of ischemic heart disease and other diseases of the circulatory system: Secondary | ICD-10-CM | POA: Diagnosis not present

## 2016-03-10 DIAGNOSIS — I10 Essential (primary) hypertension: Secondary | ICD-10-CM | POA: Diagnosis not present

## 2016-03-10 DIAGNOSIS — I252 Old myocardial infarction: Secondary | ICD-10-CM | POA: Diagnosis not present

## 2016-03-10 DIAGNOSIS — K219 Gastro-esophageal reflux disease without esophagitis: Secondary | ICD-10-CM | POA: Diagnosis not present

## 2016-03-10 DIAGNOSIS — K449 Diaphragmatic hernia without obstruction or gangrene: Secondary | ICD-10-CM | POA: Diagnosis not present

## 2016-03-10 DIAGNOSIS — Z791 Long term (current) use of non-steroidal anti-inflammatories (NSAID): Secondary | ICD-10-CM | POA: Diagnosis not present

## 2016-03-10 DIAGNOSIS — I2510 Atherosclerotic heart disease of native coronary artery without angina pectoris: Secondary | ICD-10-CM | POA: Diagnosis not present

## 2016-03-10 DIAGNOSIS — E7800 Pure hypercholesterolemia, unspecified: Secondary | ICD-10-CM | POA: Diagnosis not present

## 2016-03-10 DIAGNOSIS — Z9842 Cataract extraction status, left eye: Secondary | ICD-10-CM | POA: Diagnosis not present

## 2016-03-10 DIAGNOSIS — K5900 Constipation, unspecified: Secondary | ICD-10-CM | POA: Diagnosis not present

## 2016-03-10 DIAGNOSIS — Z87730 Personal history of (corrected) cleft lip and palate: Secondary | ICD-10-CM | POA: Diagnosis not present

## 2016-03-10 DIAGNOSIS — K224 Dyskinesia of esophagus: Secondary | ICD-10-CM | POA: Diagnosis not present

## 2016-03-10 DIAGNOSIS — J45909 Unspecified asthma, uncomplicated: Secondary | ICD-10-CM | POA: Diagnosis not present

## 2016-03-10 DIAGNOSIS — Z7982 Long term (current) use of aspirin: Secondary | ICD-10-CM | POA: Diagnosis not present

## 2016-03-10 DIAGNOSIS — Z888 Allergy status to other drugs, medicaments and biological substances status: Secondary | ICD-10-CM | POA: Diagnosis not present

## 2016-03-10 DIAGNOSIS — E039 Hypothyroidism, unspecified: Secondary | ICD-10-CM | POA: Diagnosis not present

## 2016-03-10 DIAGNOSIS — Z9841 Cataract extraction status, right eye: Secondary | ICD-10-CM | POA: Diagnosis not present

## 2016-03-10 LAB — CBC
HCT: 32 % — ABNORMAL LOW (ref 36.0–46.0)
Hemoglobin: 10.3 g/dL — ABNORMAL LOW (ref 12.0–15.0)
MCH: 30 pg (ref 26.0–34.0)
MCHC: 32.2 g/dL (ref 30.0–36.0)
MCV: 93.3 fL (ref 78.0–100.0)
PLATELETS: 256 10*3/uL (ref 150–400)
RBC: 3.43 MIL/uL — ABNORMAL LOW (ref 3.87–5.11)
RDW: 13.4 % (ref 11.5–15.5)
WBC: 13.6 10*3/uL — AB (ref 4.0–10.5)

## 2016-03-10 LAB — BASIC METABOLIC PANEL
Anion gap: 4 — ABNORMAL LOW (ref 5–15)
BUN: 15 mg/dL (ref 6–20)
CHLORIDE: 106 mmol/L (ref 101–111)
CO2: 27 mmol/L (ref 22–32)
CREATININE: 0.71 mg/dL (ref 0.44–1.00)
Calcium: 8.8 mg/dL — ABNORMAL LOW (ref 8.9–10.3)
GFR calc Af Amer: >60 mL/min (ref >60)
GFR calc non Af Amer: >60 mL/min (ref >60)
Glucose, Bld: 142 mg/dL — ABNORMAL HIGH (ref 65–99)
Potassium: 4.7 mmol/L (ref 3.5–5.1)
SODIUM: 137 mmol/L (ref 135–145)

## 2016-03-10 MED ORDER — HYDROCODONE-HOMATROPINE 5-1.5 MG/5ML PO SYRP: 5.0000 mL | ORAL_SOLUTION | ORAL | Status: DC | PRN

## 2016-03-10 MED ORDER — CARVEDILOL 3.125 MG PO TABS
3.1250 mg | ORAL_TABLET | Freq: Two times a day (BID) | ORAL | Status: DC
  Administered 2016-03-10 – 2016-03-11 (×3): 3.125 mg via ORAL
  Filled 2016-03-10 (×3): qty 1

## 2016-03-10 MED ORDER — IOPAMIDOL (ISOVUE-300) INJECTION 61%
150.0000 mL | Freq: Once | INTRAVENOUS | Status: AC | PRN
  Administered 2016-03-10: 30 mL

## 2016-03-10 NOTE — Progress Notes (Signed)
UGI looks good.  Will d/c foley and start liquid diet.

## 2016-03-10 NOTE — Telephone Encounter (Signed)
Cindy Simpson,  Please advise.

## 2016-03-10 NOTE — Progress Notes (Signed)
1 Day Post-Op  Subjective: Comfortable this morning.  Some pain in upper abdomen with deep inspiration.  No chest pain or dyspnea.  Objective: Vital signs in last 24 hours: Temp:  [97.4 F (36.3 C)-98.6 F (37 C)] 98.1 F (36.7 C) (08/23 0515) Pulse Rate:  [69-108] 108 (08/23 0515) Resp:  [11-22] 15 (08/23 0515) BP: (99-124)/(49-78) 119/54 (08/23 0515) SpO2:  [93 %-100 %] 93 % (08/23 0515) Last BM Date: 03/07/16  Intake/Output from previous day: 08/22 0701 - 08/23 0700 In: 3340 [I.V.:3290; IV Piggyback:50] Out: [id number] [Urine:1225] Intake/Output this shift: No intake/output data recorded.  PE: General- In NAD CV-RRR with HR = 80 on my exam Lungs-slight crackles in bases Abdomen-dressings dry  Lab Results:   Recent Labs  03/10/16 0453  WBC 13.6*  HGB 10.3*  HCT 32.0*  PLT 256   BMET  Recent Labs  03/10/16 0453  NA 137  K 4.7  CL 106  CO2 27  GLUCOSE 142*  BUN 15  CREATININE 0.71  CALCIUM 8.8*   PT/INR No results for input(s): LABPROT, INR in the last 72 hours. Comprehensive Metabolic Panel:    Component Value Date/Time   NA 137 03/10/2016 0453   NA 132 (L) 03/03/2016 1430   K 4.7 03/10/2016 0453   K 4.9 03/03/2016 1430   CL 106 03/10/2016 0453   CL 97 (L) 03/03/2016 1430   CO2 27 03/10/2016 0453   CO2 28 03/03/2016 1430   BUN 15 03/10/2016 0453   BUN 20 03/03/2016 1430   CREATININE 0.71 03/10/2016 0453   CREATININE 0.87 03/03/2016 1430   GLUCOSE 142 (H) 03/10/2016 0453   GLUCOSE 92 03/03/2016 1430   GLUCOSE 86 05/17/2006 0948   CALCIUM 8.8 (L) 03/10/2016 0453   CALCIUM 9.0 03/03/2016 1430   AST 25 03/03/2016 1430   AST 32 09/13/2015 1235   ALT 17 03/03/2016 1430   ALT 20 09/13/2015 1235   ALKPHOS 93 03/03/2016 1430   ALKPHOS 108 09/13/2015 1235   BILITOT 0.6 03/03/2016 1430   BILITOT 0.4 09/13/2015 1235   PROT 7.1 03/03/2016 1430   PROT 6.1 (L) 09/13/2015 1235   ALBUMIN 4.6 03/03/2016 1430   ALBUMIN 3.4 (L) 09/13/2015 1235      Studies/Results: No results found.  Anti-infectives: Anti-infectives    Start     Dose/Rate Route Frequency Ordered Stop   03/09/16 1400  ceFAZolin (ANCEF) IVPB 2g/100 mL premix     2 g 200 mL/hr over 30 Minutes Intravenous Every 8 hours 03/09/16 1356 03/09/16 1511   03/09/16 0547  ceFAZolin (ANCEF) IVPB 2g/100 mL premix     2 g 200 mL/hr over 30 Minutes Intravenous On call to O.R. 03/09/16 0547 03/09/16 0725      Assessment Principal Problem:   Hiatal hernia with shortened esophagus and  gastroesophageal reflux s/p laparoscopic hiatal hernia repair and Nissen fundoplication [id number] overnight course; clinically, looks good this AM    LOS: 1 day   Plan: Gastrograffin UGI; if okay will d/c foley and begin liquid diet.   Cindy Simpson 03/10/2016

## 2016-03-10 NOTE — Progress Notes (Signed)
Patient resting in bed, family in room.  Family helped her ambulate in hallway twice today.  Patient also ambulated to bathroom and voided.

## 2016-03-10 NOTE — Progress Notes (Signed)
Patient ambulating in hallway, with no complaints. Bariatric Coordinator to complete discharge teaching and patient will be ready for discharge.

## 2016-03-11 MED ORDER — POLYETHYLENE GLYCOL 3350 17 G PO PACK
17.0000 g | PACK | Freq: Every day | ORAL | Status: DC
  Administered 2016-03-11: 17 g via ORAL
  Filled 2016-03-11: qty 1

## 2016-03-11 MED ORDER — MAGNESIUM HYDROXIDE 400 MG/5ML PO SUSP
30.0000 mL | Freq: Every day | ORAL | Status: DC
  Administered 2016-03-11: 30 mL via ORAL
  Filled 2016-03-11: qty 30

## 2016-03-11 MED ORDER — HYDROCODONE-HOMATROPINE 5-1.5 MG/5ML PO SYRP: 5.0000 mL | ORAL_SOLUTION | ORAL | 0 refills | Status: DC | PRN

## 2016-03-11 MED ORDER — DOCUSATE SODIUM 100 MG PO CAPS
100.0000 mg | ORAL_CAPSULE | Freq: Two times a day (BID) | ORAL | Status: DC
  Administered 2016-03-11: 100 mg via ORAL
  Filled 2016-03-11: qty 1

## 2016-03-11 NOTE — Progress Notes (Signed)
Passing gas and had a BM per RN.  Will discharge today.

## 2016-03-11 NOTE — Progress Notes (Signed)
2 Days Post-Op  Subjective: Having intermittent sharp pains in lower abdomen.  Has a h/o constipation and these pains are similar to those.  Has to take a laxative at home to have a BM.  Swallowing well.  No upper abdominal or chest pain.  Has walked some.  Objective: Vital signs in last 24 hours: Temp:  [97.7 F (36.5 C)-98.3 F (36.8 C)] 98.3 F (36.8 C) (08/24 0518) Pulse Rate:  [77-99] 99 (08/24 0518) Resp:  [14-16] 14 (08/24 0518) BP: (99-129)/(47-65) 129/63 (08/24 0518) SpO2:  [90 %-96 %] 90 % (08/24 0518) Last BM Date: 03/07/16  Intake/Output from previous day: 08/23 0701 - 08/24 0700 In: 1665 [P.O.:120; I.V.:1495; IV Piggyback:50] Out: [id number] [Urine:1450] Intake/Output this shift: No intake/output data recorded.  PE: General- In NAD Abdomen-soft, not tender, few bowel sounds, dressings dry  Lab Results:   Recent Labs  03/10/16 0453  WBC 13.6*  HGB 10.3*  HCT 32.0*  PLT 256   BMET  Recent Labs  03/10/16 0453  NA 137  K 4.7  CL 106  CO2 27  GLUCOSE 142*  BUN 15  CREATININE 0.71  CALCIUM 8.8*   PT/INR No results for input(s): LABPROT, INR in the last 72 hours. Comprehensive Metabolic Panel:    Component Value Date/Time   NA 137 03/10/2016 0453   NA 132 (L) 03/03/2016 1430   K 4.7 03/10/2016 0453   K 4.9 03/03/2016 1430   CL 106 03/10/2016 0453   CL 97 (L) 03/03/2016 1430   CO2 27 03/10/2016 0453   CO2 28 03/03/2016 1430   BUN 15 03/10/2016 0453   BUN 20 03/03/2016 1430   CREATININE 0.71 03/10/2016 0453   CREATININE 0.87 03/03/2016 1430   GLUCOSE 142 (H) 03/10/2016 0453   GLUCOSE 92 03/03/2016 1430   GLUCOSE 86 05/17/2006 0948   CALCIUM 8.8 (L) 03/10/2016 0453   CALCIUM 9.0 03/03/2016 1430   AST 25 03/03/2016 1430   AST 32 09/13/2015 1235   ALT 17 03/03/2016 1430   ALT 20 09/13/2015 1235   ALKPHOS 93 03/03/2016 1430   ALKPHOS 108 09/13/2015 1235   BILITOT 0.6 03/03/2016 1430   BILITOT 0.4 09/13/2015 1235   PROT 7.1 03/03/2016 1430   PROT 6.1 (L) 09/13/2015 1235   ALBUMIN 4.6 03/03/2016 1430   ALBUMIN 3.4 (L) 09/13/2015 1235     Studies/Results: Dg Ugi W/water Sol Cm  Result Date: 03/10/2016 CLINICAL DATA:  70 year old female status post Nissen fundoplication yesterday. Initial encounter. EXAM: WATER SOLUBLE UPPER GI SERIES TECHNIQUE: Single-column upper GI series was performed using water soluble contrast. CONTRAST:  28mL ISOVUE-300 IOPAMIDOL (ISOVUE-300) PO. COMPARISON:  Preoperative upper GI 11/12/2015. FLUOROSCOPY TIME:  Radiation Exposure Index (as provided by the fluoroscopic device): 11.4 mGy If the device does not provide the exposure index: Fluoroscopy Time (in minutes and seconds):  1 minutes 6 seconds FINDINGS: Preprocedural scout view of the abdomen provided. Surgical clips and/or sutures in the gastric fundus region and along the proximal greater curve. Non obstructed bowel gas pattern. The patient was first given a few sips of water which she tolerated well. She reported sore throat. She was then given 30 mL of water-soluble contrast. Contrast quickly reached the distal esophagus. After short delay contrast traversed to the gastroesophageal junction. See series 2 image 35. Gastroesophageal junction appears within normal limits for the postoperative state. No residual hiatal hernia. Some to and fro motion of contrast within the esophagus, but after several additional dry swallows the bulk of the  esophageal contrast passed into the stomach (series 7). No leak or adverse features. IMPRESSION: Satisfactory postoperative contrast swallow status post Nissen fundoplication yesterday. Electronically Signed   By: Genevie Ann M.D.   On: 03/10/2016 11:24    Anti-infectives: Anti-infectives    Start     Dose/Rate Route Frequency Ordered Stop   03/09/16 1400  ceFAZolin (ANCEF) IVPB 2g/100 mL premix     2 g 200 mL/hr over 30 Minutes Intravenous Every 8 hours 03/09/16 1356 03/09/16 1511   03/09/16 0547  ceFAZolin (ANCEF) IVPB 2g/100  mL premix     2 g 200 mL/hr over 30 Minutes Intravenous On call to O.R. 03/09/16 0547 03/09/16 0725      Assessment Principal Problem:   Hiatal hernia with shortened esophagus and  gastroesophageal reflux s/p laparoscopic hiatal hernia repair and Nissen fundoplication [id number] better except for constipation.   LOS: 1 day   Plan: Start laxatives.  Possibly home later today or tomorrow.  Discharge instructions discussed with her.   Jerlisa Diliberto Lenna Sciara 03/11/2016

## 2016-03-12 NOTE — Telephone Encounter (Signed)
Cologaurd order formed completed/faxed to [employer]. They will contact pt to arrange shipment of her kit.

## 2016-03-18 NOTE — Discharge Summary (Signed)
Physician Discharge Summary  Patient ID: RASHA TUTT MRN: [id number] DOB/AGE: 09/15/1945 70 y.o.  Admit date: 03/09/2016 Discharge date: 03/11/2016  Admission Diagnoses:  Hiatal hernia with GERD  Discharge Diagnoses:  Principal Problem:   Hiatal hernia with gastroesophageal reflux s/p laparoscopic hiatal hernia repair and Nissen fundoplication   Discharged Condition: good  Hospital Course: She underwent the above procedure and tolerated it well.  UGI on POD #1 did not demonstrate any complications.  She was started on a liquid diet which she tolerated well.  She was able to be discharged on POD #2.  Discharge instructions were given to him.  Discharge Exam: Blood pressure 129/63, pulse 99, temperature 98.3 F (36.8 C), temperature source Oral, resp. rate 14, height 5\' 2"  (1.575 m), weight 73.7 kg (162 lb 6 oz), SpO2 90 %.   Disposition: 01-Home or Self Care     Medication List    TAKE these medications   acetaminophen 500 MG tablet Commonly known as:  TYLENOL Take 1,000 mg by mouth daily as needed for mild pain.   ALEVE 220 MG tablet Generic drug:  naproxen sodium Take 220 mg by mouth daily.   aspirin 81 MG EC tablet Take 1 tablet (81 mg total) by mouth daily. What changed:  when to take this   calcium carbonate 500 MG chewable tablet Commonly known as:  TUMS - dosed in mg elemental calcium Chew 1-2 tablets by mouth daily as needed for indigestion or heartburn.   carvedilol 3.125 MG tablet Commonly known as:  COREG Take 3.125 mg by mouth 2 (two) times daily.   Cholecalciferol 1000 units tablet Take 1,000 Units by mouth daily after supper.   diphenhydrAMINE 50 MG capsule Commonly known as:  BENADRYL Take 50 mg by mouth at bedtime.   docusate sodium 100 MG capsule Commonly known as:  COLACE Take 100 mg by mouth 2 (two) times daily after a meal.   HYDROcodone-homatropine 5-1.5 MG/5ML syrup Commonly known as:  HYCODAN Take 5 mLs by mouth every 4 (four)  hours as needed for cough.   IRON PO Take 1 tablet by mouth daily after supper.   KRILL OIL PO Take 1 capsule by mouth daily after supper.   levothyroxine 75 MCG tablet Commonly known as:  SYNTHROID, LEVOTHROID Take 1 tablet by mouth  daily What changed:  See the new instructions.   loratadine 10 MG tablet Commonly known as:  CLARITIN Take 10 mg by mouth daily after supper.   lovastatin 10 MG tablet Commonly known as:  MEVACOR Take 1 tablet by mouth  daily   multivitamin with minerals Tabs tablet Take 1 tablet by mouth daily.   nitroGLYCERIN 0.4 MG SL tablet Commonly known as:  NITROSTAT Place 1 tablet (0.4 mg total) under the tongue every 5 (five) minutes x 3 doses as needed for chest pain.   pantoprazole 40 MG tablet Commonly known as:  PROTONIX Take 1 tablet (40 mg total) by mouth 2 (two) times daily. What changed:  when to take this   valsartan 160 MG tablet Commonly known as:  DIOVAN Take 1 tablet (160 mg total) by mouth daily. What changed:  when to take this        Signed: Phat Dalton J 03/18/2016, 9:48 AM

## 2016-03-19 NOTE — Telephone Encounter (Signed)
[employer] called to advise patient's plan, Medicare only covers Cologaurd screening q 3 years. Her last one was

## 2016-03-19 NOTE — Telephone Encounter (Signed)
Patient's last Cologaurd was 03/20/2015. Order was cancelled. Medicare only covers q 3 years.

## 2016-03-30 ENCOUNTER — Encounter: Payer: Self-pay | Admitting: Internal Medicine

## 2016-03-30 ENCOUNTER — Ambulatory Visit (INDEPENDENT_AMBULATORY_CARE_PROVIDER_SITE_OTHER): Payer: Medicare Other | Admitting: Internal Medicine

## 2016-03-30 VITALS — BP 122/90 | HR 111 | Wt 156.0 lb

## 2016-03-30 DIAGNOSIS — K219 Gastro-esophageal reflux disease without esophagitis: Secondary | ICD-10-CM

## 2016-03-30 DIAGNOSIS — K449 Diaphragmatic hernia without obstruction or gangrene: Secondary | ICD-10-CM

## 2016-03-30 DIAGNOSIS — E038 Other specified hypothyroidism: Secondary | ICD-10-CM | POA: Diagnosis not present

## 2016-03-30 DIAGNOSIS — I10 Essential (primary) hypertension: Secondary | ICD-10-CM | POA: Diagnosis not present

## 2016-03-30 DIAGNOSIS — E785 Hyperlipidemia, unspecified: Secondary | ICD-10-CM

## 2016-03-30 DIAGNOSIS — Z23 Encounter for immunization: Secondary | ICD-10-CM

## 2016-03-30 NOTE — Assessment & Plan Note (Signed)
metoprolol, Diovan

## 2016-03-30 NOTE — Assessment & Plan Note (Signed)
02/2016 Nissen's s/p - recovering

## 2016-03-30 NOTE — Assessment & Plan Note (Signed)
Diet 

## 2016-03-30 NOTE — Progress Notes (Signed)
Subjective:  Patient ID: Cindy Simpson, female    DOB: 1946/05/25  Age: 70 y.o. MRN: [id number]  CC: Follow-up (3 MONTH FOLLOW UP )   HPI Cindy Simpson presents for GERD/HH, HTN, hypothyroidism f/u. She is recovering from [facility] surgery  Outpatient Medications Prior to Visit  Medication Sig Dispense Refill  . acetaminophen (TYLENOL) 500 MG tablet Take 1,000 mg by mouth daily as needed for mild pain.    Marland Kitchen aspirin EC 81 MG EC tablet Take 1 tablet (81 mg total) by mouth daily. (Patient taking differently: Take 81 mg by mouth at bedtime. ) 30 tablet 3  . calcium carbonate (TUMS - DOSED IN MG ELEMENTAL CALCIUM) 500 MG chewable tablet Chew 1-2 tablets by mouth daily as needed for indigestion or heartburn.    . carvedilol (COREG) 3.125 MG tablet Take 3.125 mg by mouth 2 (two) times daily.  3  . Cholecalciferol 1000 UNITS tablet Take 1,000 Units by mouth daily after supper.     . diphenhydrAMINE (BENADRYL) 50 MG capsule Take 50 mg by mouth at bedtime.    . docusate sodium (COLACE) 100 MG capsule Take 100 mg by mouth 2 (two) times daily after a meal.    . HYDROcodone-homatropine (HYCODAN) 5-1.5 MG/5ML syrup Take 5 mLs by mouth every 4 (four) hours as needed for cough. 120 mL 0  . IRON PO Take 1 tablet by mouth daily after supper.     Marland Kitchen KRILL OIL PO Take 1 capsule by mouth daily after supper.     . levothyroxine (SYNTHROID, LEVOTHROID) 75 MCG tablet Take 1 tablet by mouth  daily (Patient taking differently: Take 1 tablet by mouth daily at bedtime.) 90 tablet 3  . loratadine (CLARITIN) 10 MG tablet Take 10 mg by mouth daily after supper.     . lovastatin (MEVACOR) 10 MG tablet Take 1 tablet by mouth  daily 90 tablet 3  . Multiple Vitamin (MULTIVITAMIN WITH MINERALS) TABS tablet Take 1 tablet by mouth daily.    . naproxen sodium (ALEVE) 220 MG tablet Take 220 mg by mouth daily.     . nitroGLYCERIN (NITROSTAT) 0.4 MG SL tablet Place 1 tablet (0.4 mg total) under the tongue every 5 (five) minutes x 3  doses as needed for chest pain. 25 tablet 12  . pantoprazole (PROTONIX) 40 MG tablet Take 1 tablet (40 mg total) by mouth 2 (two) times daily. (Patient taking differently: Take 40 mg by mouth 2 (two) times daily before a meal. ) 180 tablet 3  . valsartan (DIOVAN) 160 MG tablet Take 1 tablet (160 mg total) by mouth daily. (Patient taking differently: Take 160 mg by mouth at bedtime. ) 90 tablet 3   No facility-administered medications prior to visit.     ROS Review of Systems  Constitutional: Negative for activity change, appetite change, chills, fatigue and unexpected weight change.  HENT: Negative for congestion, mouth sores and sinus pressure.   Eyes: Negative for visual disturbance.  Respiratory: Negative for cough and chest tightness.   Gastrointestinal: Negative for abdominal pain and nausea.  Genitourinary: Negative for difficulty urinating, frequency and vaginal pain.  Musculoskeletal: Negative for back pain and gait problem.  Skin: Negative for pallor and rash.  Neurological: Negative for dizziness, tremors, weakness, numbness and headaches.  Psychiatric/Behavioral: Negative for confusion and sleep disturbance. The patient is not nervous/anxious.     Objective:  BP 122/90   Pulse (!) 111   Wt 156 lb (70.8 kg)   SpO2 93%  BMI 28.53 kg/m   BP Readings from Last 3 Encounters:  03/30/16 122/90  03/11/16 129/63  03/03/16 129/70    Wt Readings from Last 3 Encounters:  03/30/16 156 lb (70.8 kg)  03/09/16 162 lb 6 oz (73.7 kg)  03/03/16 162 lb 6 oz (73.7 kg)    Physical Exam  Constitutional: She appears well-developed. No distress.  HENT:  Head: Normocephalic.  Right Ear: External ear normal.  Left Ear: External ear normal.  Nose: Nose normal.  Mouth/Throat: Oropharynx is clear and moist.  Eyes: Conjunctivae are normal. Pupils are equal, round, and reactive to light. Right eye exhibits no discharge. Left eye exhibits no discharge.  Neck: Normal range of motion. Neck  supple. No JVD present. No tracheal deviation present. No thyromegaly present.  Cardiovascular: Normal rate, regular rhythm and normal heart sounds.   Pulmonary/Chest: No stridor. No respiratory distress. She has no wheezes.  Abdominal: Soft. Bowel sounds are normal. She exhibits no distension and no mass. There is no tenderness. There is no rebound and no guarding.  Musculoskeletal: She exhibits no edema or tenderness.  Lymphadenopathy:    She has no cervical adenopathy.  Neurological: She displays normal reflexes. No cranial nerve deficit. She exhibits normal muscle tone. Coordination normal.  Skin: No rash noted. No erythema.  Psychiatric: She has a normal mood and affect. Her behavior is normal. Judgment and thought content normal.  Obese  Lab Results  Component Value Date   WBC 13.6 (H) 03/10/2016   HGB 10.3 (L) 03/10/2016   HCT 32.0 (L) 03/10/2016   PLT 256 03/10/2016   GLUCOSE 142 (H) 03/10/2016   CHOL 166 09/14/2015   TRIG 93 09/14/2015   HDL 45 09/14/2015   LDLDIRECT 137.5 03/17/2009   LDLCALC 102 (H) 09/14/2015   ALT 17 03/03/2016   AST 25 03/03/2016   NA 137 03/10/2016   K 4.7 03/10/2016   CL 106 03/10/2016   CREATININE 0.71 03/10/2016   BUN 15 03/10/2016   CO2 27 03/10/2016   TSH 5.00 (H) 11/03/2015   INR 0.99 03/03/2016   HGBA1C 5.9 11/03/2015    No results found.  Assessment & Plan:   Cindy Simpson was seen today for follow-up.  Diagnoses and all orders for this visit:  Need for influenza vaccination -     Flu vaccine HIGH DOSE PF (Fluzone High dose)   I am having [name] maintain her Cholecalciferol, loratadine, multivitamin with minerals, aspirin, nitroGLYCERIN, valsartan, KRILL OIL PO, IRON PO, naproxen sodium, carvedilol, pantoprazole, levothyroxine, lovastatin, calcium carbonate, acetaminophen, docusate sodium, diphenhydrAMINE, and HYDROcodone-homatropine.  No orders of the defined types were placed in this encounter.    Follow-up: No Follow-up on  file.  [name], MD

## 2016-03-30 NOTE — Assessment & Plan Note (Signed)
Dr Barkley Bruns 02/2016 Nissen's s/p - recovering

## 2016-03-30 NOTE — Assessment & Plan Note (Signed)
On Levothroid 

## 2016-04-09 DIAGNOSIS — E785 Hyperlipidemia, unspecified: Secondary | ICD-10-CM | POA: Diagnosis not present

## 2016-04-09 DIAGNOSIS — I10 Essential (primary) hypertension: Secondary | ICD-10-CM | POA: Diagnosis not present

## 2016-04-09 DIAGNOSIS — I252 Old myocardial infarction: Secondary | ICD-10-CM | POA: Diagnosis not present

## 2016-04-09 DIAGNOSIS — I5181 Takotsubo syndrome: Secondary | ICD-10-CM | POA: Diagnosis not present

## 2016-04-09 DIAGNOSIS — I2510 Atherosclerotic heart disease of native coronary artery without angina pectoris: Secondary | ICD-10-CM | POA: Diagnosis not present

## 2016-04-22 DIAGNOSIS — R197 Diarrhea, unspecified: Secondary | ICD-10-CM | POA: Diagnosis not present

## 2016-05-18 ENCOUNTER — Telehealth: Payer: Self-pay | Admitting: Emergency Medicine

## 2016-05-18 MED ORDER — MECLIZINE HCL 12.5 MG PO TABS: 12.5000 mg | ORAL_TABLET | Freq: Two times a day (BID) | ORAL | 1 refills | Status: AC | PRN

## 2016-05-18 NOTE — Telephone Encounter (Signed)
Meclizine Rx emailed Thx 

## 2016-05-18 NOTE — Telephone Encounter (Signed)
Pt called and is asking for a prescription for dizziness. She states you have given her something before. The pharmacy is [facility] [address]. Please advise thanks.

## 2016-05-27 ENCOUNTER — Encounter: Payer: Self-pay | Admitting: Gastroenterology

## 2016-05-27 ENCOUNTER — Ambulatory Visit (INDEPENDENT_AMBULATORY_CARE_PROVIDER_SITE_OTHER): Payer: Medicare Other | Admitting: Gastroenterology

## 2016-05-27 ENCOUNTER — Encounter (INDEPENDENT_AMBULATORY_CARE_PROVIDER_SITE_OTHER): Payer: Self-pay

## 2016-05-27 VITALS — BP 122/80 | HR 96 | Ht 60.75 in | Wt 159.4 lb

## 2016-05-27 DIAGNOSIS — K219 Gastro-esophageal reflux disease without esophagitis: Secondary | ICD-10-CM | POA: Diagnosis not present

## 2016-05-27 DIAGNOSIS — R1012 Left upper quadrant pain: Secondary | ICD-10-CM | POA: Diagnosis not present

## 2016-05-27 DIAGNOSIS — K449 Diaphragmatic hernia without obstruction or gangrene: Secondary | ICD-10-CM | POA: Diagnosis not present

## 2016-05-27 NOTE — Patient Instructions (Addendum)
Decrease pantoprazole to once daily.  In 2 weeks, if you still only have occasional heartburn, decrease the pantoprazole to three times a week (for example, Mon,wed,fri)  2 weeks after that, if you still only have occasional heartburn, stop the medicine and take it as needed for severe heartburn.  If you are age 71 or older, your body mass index should be between 23-30. Your Body mass index is 30.36 kg/m. If this is out of the aforementioned range listed, please consider follow up with your Primary Care Provider.  If you are age 50 or younger, your body mass index should be between 19-25. Your Body mass index is 30.36 kg/m. If this is out of the aformentioned range listed, please consider follow up with your Primary Care Provider.   Thank you for choosing  GI  Dr Wilfrid Lund III

## 2016-05-27 NOTE — Progress Notes (Signed)
[facility] Progress Note  Chief Complaint: GERD  Subjective  History:  Cindy Simpson is here for follow-up after her repair of hiatal hernia and fundoplication on [id number] with Dr. Zella Richer.  I'm glad to say she is much better overall. She has rare episodes of heartburn, denies dysphagia, appetite is good and weight stable. She denies nausea vomiting bloating and early satiety or weight loss. She has had intermittent left upper quadrant "pulling" ever since the surgery. It is difficult to characterize, as she is a limited historian. It seems to occur without clear triggers, and may be worse with certain movements. It seems that her bowel habits are regular and she denies rectal bleeding. She is up-to-date on colon cancer screening with a negative colon guard test in August 2016.   ROS: Cardiovascular:  no chest pain Respiratory: no dyspnea  The patient's Past Medical, Family and Social History were reviewed and are on file in the EMR.  Objective:  Med list reviewed  Vital signs in last 24 hrs: Vitals:   05/27/16 0946  BP: 122/80  Pulse: 96    Physical Exam    HEENT: sclera anicteric, oral mucosa moist without lesions  Neck: supple, no thyromegaly, JVD or lymphadenopathy  Cardiac: RRR without murmurs, S1S2 heard, no peripheral edema  Pulm: clear to auscultation bilaterally, normal RR and effort noted  Abdomen: soft, No tenderness, with active bowel sounds. No guarding or palpable hepatosplenomegaly.  Skin; warm and dry, no jaundice or rash  I reviewed Dr. Bertrum Sol operative report  @ASSESSMENTPLANBEGIN @ Assessment: Encounter Diagnoses  Name Primary?  . Gastroesophageal reflux disease without esophagitis Yes  . Hiatal hernia with gastroesophageal reflux   . LUQ pain    This left upper quadrant discomfort sounds benign. Her upper GI symptoms are much improved after her surgery.  Plan: I gave her written instructions to decrease Protonix to once daily, and then  try to wean off it over the next several weeks. She will see me as needed   Total time 15 minutes, over half spent in counseling and coordination of care.   [name]

## 2016-06-29 ENCOUNTER — Ambulatory Visit (INDEPENDENT_AMBULATORY_CARE_PROVIDER_SITE_OTHER): Payer: Medicare Other | Admitting: Internal Medicine

## 2016-06-29 ENCOUNTER — Encounter: Payer: Self-pay | Admitting: Internal Medicine

## 2016-06-29 ENCOUNTER — Other Ambulatory Visit (INDEPENDENT_AMBULATORY_CARE_PROVIDER_SITE_OTHER): Payer: Medicare Other

## 2016-06-29 VITALS — BP 120/70 | HR 84 | Wt 156.0 lb

## 2016-06-29 DIAGNOSIS — K219 Gastro-esophageal reflux disease without esophagitis: Secondary | ICD-10-CM

## 2016-06-29 DIAGNOSIS — K449 Diaphragmatic hernia without obstruction or gangrene: Secondary | ICD-10-CM

## 2016-06-29 DIAGNOSIS — R5383 Other fatigue: Secondary | ICD-10-CM | POA: Diagnosis not present

## 2016-06-29 DIAGNOSIS — R0789 Other chest pain: Secondary | ICD-10-CM | POA: Insufficient documentation

## 2016-06-29 DIAGNOSIS — R202 Paresthesia of skin: Secondary | ICD-10-CM | POA: Diagnosis not present

## 2016-06-29 DIAGNOSIS — E038 Other specified hypothyroidism: Secondary | ICD-10-CM | POA: Diagnosis not present

## 2016-06-29 DIAGNOSIS — R739 Hyperglycemia, unspecified: Secondary | ICD-10-CM | POA: Diagnosis not present

## 2016-06-29 LAB — BASIC METABOLIC PANEL
BUN: 16 mg/dL (ref 6–23)
CALCIUM: 9.5 mg/dL (ref 8.4–10.5)
CHLORIDE: 101 meq/L (ref 96–112)
CO2: 29 meq/L (ref 19–32)
Creatinine, Ser: 0.86 mg/dL (ref 0.40–1.20)
GFR: 69.14 mL/min (ref >60.00)
Glucose, Bld: 95 mg/dL (ref 70–99)
POTASSIUM: 4.6 meq/L (ref 3.5–5.1)
SODIUM: 138 meq/L (ref 135–145)

## 2016-06-29 LAB — HEPATIC FUNCTION PANEL
ALK PHOS: 98 U/L (ref 39–117)
ALT: 12 U/L (ref 0–35)
AST: 17 U/L (ref 0–37)
Albumin: 4.3 g/dL (ref 3.5–5.2)
BILIRUBIN DIRECT: 0 mg/dL (ref 0.0–0.3)
TOTAL PROTEIN: 7.1 g/dL (ref 6.0–8.3)
Total Bilirubin: 0.5 mg/dL (ref 0.2–1.2)

## 2016-06-29 LAB — CBC WITH DIFFERENTIAL/PLATELET
BASOS ABS: 0 10*3/uL (ref 0.0–0.1)
Basophils Relative: 0.7 % (ref 0.0–3.0)
EOS PCT: 3.7 % (ref 0.0–5.0)
Eosinophils Absolute: 0.3 10*3/uL (ref 0.0–0.7)
HEMATOCRIT: 36.7 % (ref 36.0–46.0)
Hemoglobin: 12.4 g/dL (ref 12.0–15.0)
LYMPHS PCT: 28.7 % (ref 12.0–46.0)
Lymphs Abs: 1.9 10*3/uL (ref 0.7–4.0)
MCHC: 33.8 g/dL (ref 30.0–36.0)
MCV: 90.5 fl (ref 78.0–100.0)
MONOS PCT: 11.7 % (ref 3.0–12.0)
Monocytes Absolute: 0.8 10*3/uL (ref 0.1–1.0)
NEUTROS ABS: 3.7 10*3/uL (ref 1.4–7.7)
Neutrophils Relative %: 55.2 % (ref 43.0–77.0)
PLATELETS: 345 10*3/uL (ref 150.0–400.0)
RBC: 4.05 Mil/uL (ref 3.87–5.11)
RDW: 14.2 % (ref 11.5–15.5)
WBC: 6.7 10*3/uL (ref 4.0–10.5)

## 2016-06-29 LAB — URINALYSIS, ROUTINE W REFLEX MICROSCOPIC
Bilirubin Urine: NEGATIVE
Hgb urine dipstick: NEGATIVE
KETONES UR: NEGATIVE
Nitrite: NEGATIVE
PH: 6.5 (ref 5.0–8.0)
Specific Gravity, Urine: 1.015 (ref 1.000–1.030)
Total Protein, Urine: NEGATIVE
URINE GLUCOSE: NEGATIVE
UROBILINOGEN UA: 0.2 (ref 0.0–1.0)

## 2016-06-29 LAB — HEMOGLOBIN A1C: Hgb A1c MFr Bld: 5.9 % (ref 4.6–6.5)

## 2016-06-29 LAB — TSH: TSH: 4.56 u[IU]/mL — ABNORMAL HIGH (ref 0.35–4.50)

## 2016-06-29 LAB — VITAMIN B12: VITAMIN B 12: 795 pg/mL (ref 211–911)

## 2016-06-29 NOTE — Assessment & Plan Note (Signed)
On Levothroid 

## 2016-06-29 NOTE — Assessment & Plan Note (Signed)
GERD is better Pt has CP due to costochondral junctions pain vs spasms vs scar tissue Tramadol offered - refused F/u w/Dr Barkley Bruns

## 2016-06-29 NOTE — Assessment & Plan Note (Addendum)
12/17 costochondral junctions pain vs spasms vs scar tissue Tramadol offered - refused F/u w/Dr Barkley Bruns

## 2016-06-29 NOTE — Assessment & Plan Note (Addendum)
Labs Hold Lovastatin x 1 month

## 2016-06-29 NOTE — Assessment & Plan Note (Signed)
Labs

## 2016-06-29 NOTE — Progress Notes (Signed)
Subjective:  Patient ID: Cindy Simpson, female    DOB: [birth year]  Age: 70 y.o. MRN: [id number]  CC: No chief complaint on file.   HPI Cindy Simpson presents for GERD - s/p fundoplication in 8/17: c/o CP when eating and abd pain. F/u HTN, hypothyroidism, dyslipidemia f/u  Outpatient Medications Prior to Visit  Medication Sig Dispense Refill  . acetaminophen (TYLENOL) 500 MG tablet Take 1,000 mg by mouth daily as needed for mild pain.    Marland Kitchen aspirin EC 81 MG EC tablet Take 1 tablet (81 mg total) by mouth daily. (Patient taking differently: Take 81 mg by mouth at bedtime. ) 30 tablet 3  . calcium carbonate (TUMS - DOSED IN MG ELEMENTAL CALCIUM) 500 MG chewable tablet Chew 1-2 tablets by mouth daily as needed for indigestion or heartburn.    . carvedilol (COREG) 3.125 MG tablet Take 3.125 mg by mouth 2 (two) times daily.  3  . Cholecalciferol 1000 UNITS tablet Take 1,000 Units by mouth daily after supper.     . diphenhydrAMINE (BENADRYL) 50 MG capsule Take 50 mg by mouth at bedtime.    . docusate sodium (COLACE) 100 MG capsule Take 100 mg by mouth 2 (two) times daily after a meal.    . IRON PO Take 1 tablet by mouth daily after supper.     Marland Kitchen KRILL OIL PO Take 1 capsule by mouth daily after supper.     . levothyroxine (SYNTHROID, LEVOTHROID) 75 MCG tablet Take 1 tablet by mouth  daily (Patient taking differently: Take 1 tablet by mouth daily at bedtime.) 90 tablet 3  . loratadine (CLARITIN) 10 MG tablet Take 10 mg by mouth daily after supper.     . lovastatin (MEVACOR) 10 MG tablet Take 1 tablet by mouth  daily 90 tablet 3  . meclizine (ANTIVERT) 12.5 MG tablet Take 1 tablet (12.5 mg total) by mouth 2 (two) times daily as needed for dizziness. 60 tablet 1  . Multiple Vitamin (MULTIVITAMIN WITH MINERALS) TABS tablet Take 1 tablet by mouth daily.    . naproxen sodium (ALEVE) 220 MG tablet Take 220 mg by mouth daily.     . nitroGLYCERIN (NITROSTAT) 0.4 MG SL tablet Place 1 tablet (0.4 mg  total) under the tongue every 5 (five) minutes x 3 doses as needed for chest pain. 25 tablet 12  . pantoprazole (PROTONIX) 40 MG tablet Take 1 tablet (40 mg total) by mouth 2 (two) times daily. (Patient taking differently: Take 40 mg by mouth 2 (two) times daily before a meal. ) 180 tablet 3  . valsartan (DIOVAN) 160 MG tablet Take 1 tablet (160 mg total) by mouth daily. (Patient taking differently: Take 160 mg by mouth at bedtime. ) 90 tablet 3  . HYDROcodone-homatropine (HYCODAN) 5-1.5 MG/5ML syrup Take 5 mLs by mouth every 4 (four) hours as needed for cough. (Patient not taking: Reported on 06/29/2016) 120 mL 0   No facility-administered medications prior to visit.     ROS Review of Systems  Constitutional: Positive for diaphoresis and fatigue. Negative for activity change, appetite change, chills and unexpected weight change.  HENT: Negative for congestion, mouth sores and sinus pressure.   Eyes: Negative for visual disturbance.  Respiratory: Positive for chest tightness. Negative for cough.   Cardiovascular: Positive for chest pain.  Gastrointestinal: Positive for abdominal pain. Negative for nausea.  Genitourinary: Negative for difficulty urinating, frequency and vaginal pain.  Musculoskeletal: Negative for back pain and gait problem.  Skin: Negative  for pallor and rash.  Neurological: Negative for dizziness, tremors, weakness, numbness and headaches.  Psychiatric/Behavioral: Negative for confusion and sleep disturbance.    Objective:  BP 120/70   Pulse 84   Wt 156 lb (70.8 kg)   SpO2 96%   BMI 29.72 kg/m   BP Readings from Last 3 Encounters:  06/29/16 120/70  05/27/16 122/80  03/30/16 122/90    Wt Readings from Last 3 Encounters:  06/29/16 156 lb (70.8 kg)  05/27/16 159 lb 6 oz (72.3 kg)  03/30/16 156 lb (70.8 kg)    Physical Exam  Constitutional: She appears well-developed. No distress.  HENT:  Head: Normocephalic.  Right Ear: External ear normal.  Left Ear:  External ear normal.  Nose: Nose normal.  Mouth/Throat: Oropharynx is clear and moist.  Eyes: Conjunctivae are normal. Pupils are equal, round, and reactive to light. Right eye exhibits no discharge. Left eye exhibits no discharge.  Neck: Normal range of motion. Neck supple. No JVD present. No tracheal deviation present. No thyromegaly present.  Cardiovascular: Normal rate, regular rhythm and normal heart sounds.   Pulmonary/Chest: No stridor. No respiratory distress. She has no wheezes.  Abdominal: Soft. Bowel sounds are normal. She exhibits no distension and no mass. There is tenderness. There is no rebound and no guarding.  Musculoskeletal: She exhibits tenderness. She exhibits no edema.  Lymphadenopathy:    She has no cervical adenopathy.  Neurological: She displays normal reflexes. No cranial nerve deficit. She exhibits normal muscle tone. Coordination normal.  Skin: No rash noted. No erythema.  Psychiatric: She has a normal mood and affect. Her behavior is normal. Judgment and thought content normal.  abd is sensitive in the RLQ Tender over sternum  Lab Results  Component Value Date   WBC 13.6 (H) 03/10/2016   HGB 10.3 (L) 03/10/2016   HCT 32.0 (L) 03/10/2016   PLT 256 03/10/2016   GLUCOSE 142 (H) 03/10/2016   CHOL 166 09/14/2015   TRIG 93 09/14/2015   HDL 45 09/14/2015   LDLDIRECT 137.5 03/17/2009   LDLCALC 102 (H) 09/14/2015   ALT 17 03/03/2016   AST 25 03/03/2016   NA 137 03/10/2016   K 4.7 03/10/2016   CL 106 03/10/2016   CREATININE 0.71 03/10/2016   BUN 15 03/10/2016   CO2 27 03/10/2016   TSH 5.00 (H) 11/03/2015   INR 0.99 03/03/2016   HGBA1C 5.9 11/03/2015    No results found.  Assessment & Plan:   There are no diagnoses linked to this encounter. I am having Cindy Simpson maintain her Cholecalciferol, loratadine, multivitamin with minerals, aspirin, nitroGLYCERIN, valsartan, KRILL OIL PO, IRON PO, naproxen sodium, carvedilol, pantoprazole, levothyroxine,  lovastatin, calcium carbonate, acetaminophen, docusate sodium, diphenhydrAMINE, HYDROcodone-homatropine, and meclizine.  No orders of the defined types were placed in this encounter.    Follow-up: No Follow-up on file.  [name], MD

## 2016-06-29 NOTE — Progress Notes (Signed)
Pre visit review using our clinic review tool, if applicable. No additional management support is needed unless otherwise documented below in the visit note. 

## 2016-07-05 ENCOUNTER — Encounter: Payer: Self-pay | Admitting: Internal Medicine

## 2016-07-08 ENCOUNTER — Other Ambulatory Visit: Payer: Self-pay | Admitting: Internal Medicine

## 2016-07-08 DIAGNOSIS — R7989 Other specified abnormal findings of blood chemistry: Secondary | ICD-10-CM

## 2016-07-09 DIAGNOSIS — K219 Gastro-esophageal reflux disease without esophagitis: Secondary | ICD-10-CM | POA: Diagnosis not present

## 2016-07-09 DIAGNOSIS — I2510 Atherosclerotic heart disease of native coronary artery without angina pectoris: Secondary | ICD-10-CM | POA: Diagnosis not present

## 2016-07-09 DIAGNOSIS — I252 Old myocardial infarction: Secondary | ICD-10-CM | POA: Diagnosis not present

## 2016-07-09 DIAGNOSIS — I10 Essential (primary) hypertension: Secondary | ICD-10-CM | POA: Diagnosis not present

## 2016-07-09 DIAGNOSIS — I5181 Takotsubo syndrome: Secondary | ICD-10-CM | POA: Diagnosis not present

## 2016-07-16 DIAGNOSIS — R1012 Left upper quadrant pain: Secondary | ICD-10-CM | POA: Diagnosis not present

## 2016-07-20 ENCOUNTER — Emergency Department (HOSPITAL_COMMUNITY)
Admission: EM | Admit: 2016-07-20 | Discharge: 2016-07-21 | Disposition: A | Discharge status: Home / Self Care | Payer: Medicare Other | Attending: Emergency Medicine | Admitting: Emergency Medicine

## 2016-07-20 ENCOUNTER — Encounter (HOSPITAL_COMMUNITY): Payer: Self-pay | Admitting: Emergency Medicine

## 2016-07-20 ENCOUNTER — Emergency Department (HOSPITAL_COMMUNITY): Payer: Medicare Other

## 2016-07-20 DIAGNOSIS — J45909 Unspecified asthma, uncomplicated: Secondary | ICD-10-CM | POA: Insufficient documentation

## 2016-07-20 DIAGNOSIS — E039 Hypothyroidism, unspecified: Secondary | ICD-10-CM | POA: Insufficient documentation

## 2016-07-20 DIAGNOSIS — R079 Chest pain, unspecified: Secondary | ICD-10-CM

## 2016-07-20 DIAGNOSIS — Z79899 Other long term (current) drug therapy: Secondary | ICD-10-CM | POA: Diagnosis not present

## 2016-07-20 DIAGNOSIS — Z7982 Long term (current) use of aspirin: Secondary | ICD-10-CM | POA: Insufficient documentation

## 2016-07-20 DIAGNOSIS — R0789 Other chest pain: Secondary | ICD-10-CM | POA: Diagnosis not present

## 2016-07-20 DIAGNOSIS — R072 Precordial pain: Secondary | ICD-10-CM | POA: Diagnosis not present

## 2016-07-20 LAB — BASIC METABOLIC PANEL
Anion gap: 10 (ref 5–15)
BUN: 21 mg/dL — AB (ref 6–20)
CALCIUM: 9.5 mg/dL (ref 8.9–10.3)
CHLORIDE: 102 mmol/L (ref 101–111)
CO2: 25 mmol/L (ref 22–32)
CREATININE: 1.02 mg/dL — AB (ref 0.44–1.00)
GFR calc non Af Amer: 54 mL/min — ABNORMAL LOW (ref >60)
Glucose, Bld: 129 mg/dL — ABNORMAL HIGH (ref 65–99)
Potassium: 4 mmol/L (ref 3.5–5.1)
SODIUM: 137 mmol/L (ref 135–145)

## 2016-07-20 LAB — CBC
HCT: 35.7 % — ABNORMAL LOW (ref 36.0–46.0)
Hemoglobin: 11.7 g/dL — ABNORMAL LOW (ref 12.0–15.0)
MCH: 30.4 pg (ref 26.0–34.0)
MCHC: 32.8 g/dL (ref 30.0–36.0)
MCV: 92.7 fL (ref 78.0–100.0)
PLATELETS: 309 10*3/uL (ref 150–400)
RBC: 3.85 MIL/uL — ABNORMAL LOW (ref 3.87–5.11)
RDW: 13.6 % (ref 11.5–15.5)
WBC: 10.8 10*3/uL — AB (ref 4.0–10.5)

## 2016-07-20 LAB — I-STAT TROPONIN, ED: TROPONIN I, POC: 0 ng/mL (ref 0.00–0.08)

## 2016-07-20 MED ORDER — ASPIRIN 81 MG PO CHEW: 324.0000 mg | CHEWABLE_TABLET | Freq: Once | ORAL | Status: DC

## 2016-07-20 MED ORDER — ACETAMINOPHEN 500 MG PO TABS
1000.0000 mg | ORAL_TABLET | Freq: Once | ORAL | Status: AC
  Administered 2016-07-20: 1000 mg via ORAL
  Filled 2016-07-20: qty 2

## 2016-07-20 MED ORDER — NITROGLYCERIN 0.4 MG SL SUBL
0.4000 mg | SUBLINGUAL_TABLET | SUBLINGUAL | Status: DC | PRN
  Administered 2016-07-20: 0.4 mg via SUBLINGUAL
  Filled 2016-07-20: qty 1

## 2016-07-20 NOTE — ED Triage Notes (Signed)
See note from [place], [place]

## 2016-07-20 NOTE — ED Notes (Signed)
On way to XR 

## 2016-07-20 NOTE — ED Triage Notes (Signed)
Patient comes by EMS for evaluation of chest pain that began 30 min before EMS arrived.  Pain was central in nature and radiated to mid back.  EKG was unremarkable and vitals are stable.  A&Ox4

## 2016-07-20 NOTE — ED Provider Notes (Signed)
[facility] DEPT Provider Note   CSN: [id number] Arrival date & time: 07/20/16  2044     History   Chief Complaint Chief Complaint  Patient presents with  . Chest Pain    HPI Cindy Simpson is a 71 y.o. female.  The history is provided by the patient.  Chest Pain   This is a new problem. The current episode started less than 1 hour ago. The problem occurs constantly. The problem has been rapidly improving. The pain is associated with rest. The pain is present in the substernal region. The pain is moderate. The quality of the pain is described as pressure-like. The pain radiates to the upper back and mid back. Duration of episode(s) is 1 hour. Pertinent negatives include no abdominal pain and no vomiting. Treatments tried: aspirin. Risk factors include being elderly and lack of exercise.  Her past medical history is significant for CAD.    Past Medical History:  Diagnosis Date  . Allergic rhinitis   . Anemia   . Asthmatic bronchitis 2009   allergies; post nasal drip but not allergies  . GERD (gastroesophageal reflux disease)   . History of hiatal hernia   . Hyperlipidemia   . Hypothyroidism   . IBS (irritable bowel syndrome)   . Insomnia   . Irregular heart beat Not sure; long time ago   Periodic irregular heart rate; take verapamil.  . Status post dilation of esophageal narrowing   . Torticollis 2006   Estimate date; rec'd botox and resolved    Patient Active Problem List   Diagnosis Date Noted  . Chest wall pain 06/29/2016  . Hiatal hernia with gastroesophageal reflux 03/09/2016  . Deficiency anemia 11/03/2015  . Hyperglycemia 11/03/2015  . Eczema of both hands 02/28/2015  . Heart murmur 02/28/2015  . Onychomycosis 02/18/2014  . Callus of foot 02/18/2014  . Dizziness 08/14/2013  . Cheilitis 10/16/2012  . Hypokalemia 06/22/2012  . Leukocytosis 06/22/2012  . Viral gastroenteritis with intractable nausea and vomiting 06/22/2012  . Well adult exam 04/17/2012    . Right shoulder pain 04/17/2012  . Hypertension 10/16/2010  . HIP PAIN 03/24/2010  . Acute upper respiratory infection 10/20/2009  . ARTHRALGIA 10/20/2009  . Fatigue 10/20/2009  . Hypothyroidism 03/15/2008  . Dyslipidemia 03/15/2008  . INSOMNIA, PERSISTENT 03/15/2008  . ALLERGIC RHINITIS 03/15/2008  . Asthma 03/15/2008  . GERD 03/15/2008    Past Surgical History:  Procedure Laterality Date  . ABDOMINAL HYSTERECTOMY     with ovaries removed also.  Marland Kitchen CARDIAC CATHETERIZATION N/A 09/13/2015   Procedure: Left Heart Cath and Coronary Angiography;  Surgeon: Charolette Forward, MD;  Location: [facility];  Service: Cardiovascular;  Laterality: N/A;  . CATARACT EXTRACTION Bilateral 2014   approx 2 years ago  . CLEFT PALATE REPAIR     child  . ESOPHAGOGASTRODUODENOSCOPY (EGD) WITH PROPOFOL N/A 12/30/2015   Procedure: ESOPHAGOGASTRODUODENOSCOPY (EGD) WITH PROPOFOL;  Surgeon: Doran Stabler, MD;  Location: [facility];  Service: Gastroenterology;  Laterality: N/A;  . HIATAL HERNIA REPAIR N/A 03/09/2016   Procedure: LAPAROSCOPIC REPAIR OF HIATAL HERNIA  NISSEN FUNDOPLICATION UPPER ENDOSCOPY;  Surgeon: Jackolyn Confer, MD;  Location: [facility];  Service: General;  Laterality: N/A;  . NISSEN FUNDOPLICATION N/A [id number]   Procedure: NISSEN FUNDOPLICATION;  Surgeon: Jackolyn Confer, MD;  Location: [facility];  Service: General;  Laterality: N/A;  . TUBAL LIGATION Bilateral 1985  . UPPER GI ENDOSCOPY  03/09/2016   Procedure: UPPER GI ENDOSCOPY;  Surgeon: Jackolyn Confer, MD;  Location: [facility];  Service: General;;    OB History    No data available       Home Medications    Prior to Admission medications   Medication Sig Start Date End Date Taking? Authorizing Provider  aspirin EC 325 MG tablet Take 325-650 mg by mouth once as needed (for chest pain).   Yes Historical Provider, MD  aspirin EC 81 MG EC tablet Take 1 tablet (81 mg total) by mouth daily. Patient taking differently: Take 81 mg  by mouth at bedtime.  09/14/15  Yes Charolette Forward, MD  carvedilol (COREG) 3.125 MG tablet Take 3.125 mg by mouth 2 (two) times daily. 12/24/15  Yes Historical Provider, MD  levothyroxine (SYNTHROID, LEVOTHROID) 75 MCG tablet Take 1 tablet by mouth  daily Patient taking differently: Take 75 mcg by mouth once a day 01/27/16  Yes Evie Lacks [name], MD  loratadine (CLARITIN) 10 MG tablet Take 10 mg by mouth daily after supper.    Yes Historical Provider, MD  Simethicone 125 MG CAPS Take 1-2 capsules by mouth daily as needed (for gas pains).   Yes Historical Provider, MD  acetaminophen (TYLENOL) 500 MG tablet Take 1,000 mg by mouth daily as needed for mild pain.    Historical Provider, MD  calcium carbonate (TUMS - DOSED IN MG ELEMENTAL CALCIUM) 500 MG chewable tablet Chew 1-2 tablets by mouth daily as needed for indigestion or heartburn.    Historical Provider, MD  Cholecalciferol 1000 UNITS tablet Take 1,000 Units by mouth daily after supper.  10/13/10   Evie Lacks [name], MD  diphenhydrAMINE (BENADRYL) 50 MG capsule Take 50 mg by mouth at bedtime.    Historical Provider, MD  docusate sodium (COLACE) 100 MG capsule Take 100 mg by mouth 2 (two) times daily after a meal.    Historical Provider, MD  IRON PO Take 1 tablet by mouth daily after supper.     Historical Provider, MD  KRILL OIL PO Take 1 capsule by mouth daily after supper.     Historical Provider, MD  lovastatin (MEVACOR) 10 MG tablet Take 1 tablet by mouth  daily 01/27/16   Evie Lacks [name], MD  meclizine (ANTIVERT) 12.5 MG tablet Take 1 tablet (12.5 mg total) by mouth 2 (two) times daily as needed for dizziness. 05/18/16 05/18/17  Evie Lacks [name], MD  Multiple Vitamin (MULTIVITAMIN WITH MINERALS) TABS tablet Take 1 tablet by mouth daily.    Historical Provider, MD  naproxen sodium (ALEVE) 220 MG tablet Take 220 mg by mouth daily.     Historical Provider, MD  nitroGLYCERIN (NITROSTAT) 0.4 MG SL tablet Place 1 tablet (0.4 mg total) under  the tongue every 5 (five) minutes x 3 doses as needed for chest pain. 09/14/15   Charolette Forward, MD  pantoprazole (PROTONIX) 40 MG tablet Take 1 tablet (40 mg total) by mouth 2 (two) times daily. Patient taking differently: Take 40 mg by mouth 2 (two) times daily before a meal.  12/31/15   Cassandria Anger, MD  valsartan (DIOVAN) 160 MG tablet Take 1 tablet (160 mg total) by mouth daily. Patient taking differently: Take 160 mg by mouth at bedtime.  12/01/15   Cassandria Anger, MD    Family History Family History  Problem Relation Age of Onset  . Hypertension Mother   . Heart disease Father   . Stroke Father   . Stroke Brother     brain aneurism  . Colon polyps Sister   . Other Sister  c diff-deceased    Social History Social History  Substance Use Topics  . Smoking status: Never Smoker  . Smokeless tobacco: Never Used  . Alcohol use 0.6 - 1.2 oz/week    1 - 2 Shots of liquor per week     Comment: Perhaps every couple of weeks; not very frequently     Allergies   Lisinopril; Vinegar [acetic acid]; and Metoclopramide hcl   Review of Systems Review of Systems  Cardiovascular: Positive for chest pain.  Gastrointestinal: Negative for abdominal pain and vomiting.  All other systems reviewed and are negative.    Physical Exam Updated Vital Signs BP 127/73   Pulse 75   Resp 17   Ht 5\' 3"  (1.6 m)   Wt 154 lb (69.9 kg)   SpO2 99%   BMI 27.28 kg/m   Physical Exam  Constitutional: She is oriented to person, place, and time. She appears well-developed and well-nourished. No distress.  HENT:  Head: Normocephalic.  Nose: Nose normal.  Eyes: Conjunctivae are normal.  Neck: Neck supple. No tracheal deviation present.  Cardiovascular: Normal rate, regular rhythm and normal heart sounds.   Pulmonary/Chest: Effort normal and breath sounds normal. No respiratory distress.  Abdominal: Soft. She exhibits no distension. There is no tenderness.  Musculoskeletal: She  exhibits no tenderness or deformity.  Neurological: She is alert and oriented to person, place, and time.  Skin: Skin is warm and dry.  Psychiatric: She has a normal mood and affect.  Vitals reviewed.    ED Treatments / Results  Labs (all labs ordered are listed, but only abnormal results are displayed) Labs Reviewed  BASIC METABOLIC PANEL - Abnormal; Notable for the following:       Result Value   Glucose, Bld 129 (*)    BUN 21 (*)    Creatinine, Ser 1.02 (*)    GFR calc non Af Amer 54 (*)    All other components within normal limits  CBC - Abnormal; Notable for the following:    WBC 10.8 (*)    RBC 3.85 (*)    Hemoglobin 11.7 (*)    HCT 35.7 (*)    All other components within normal limits  I-STAT TROPOININ, ED  I-STAT TROPOININ, ED    EKG  EKG Interpretation  Date/Time:  Tuesday July 20 2016 20:44:47 EST Ventricular Rate:  81 PR Interval:    QRS Duration: 100 QT Interval:  383 QTC Calculation: 445 R Axis:   77 Text Interpretation:  Sinus rhythm Normal ECG No significant change since last tracing Confirmed by Ilithyia Titzer MD, Quillian Quince [id number]) on 07/20/2016 9:02:05 PM       Radiology Dg Chest 2 View  Result Date: 07/20/2016 CLINICAL DATA:  Chest pain 30 minutes prior to arrival, central in nature radiating to the back. Concurrent diarrhea. History of asthma and heart attack. EXAM: CHEST  2 VIEW COMPARISON:  Chest radiograph September 21, 2015 FINDINGS: Cardiomediastinal silhouette is normal. Minimal calcific atherosclerosis aortic arch. No pleural effusions or focal consolidations. Mildly elevated LEFT hemidiaphragm. Trachea projects midline and there is no pneumothorax. Soft tissue planes and included osseous structures are non-suspicious. Interval fundoplication. Osteopenia. IMPRESSION: No acute cardiopulmonary process. Electronically Signed   By: Elon Alas M.D.   On: 07/20/2016 22:14    Procedures Procedures (including critical care time)  Medications Ordered in  ED Medications  nitroGLYCERIN (NITROSTAT) SL tablet 0.4 mg (0.4 mg Sublingual Given 07/20/16 2113)  acetaminophen (TYLENOL) tablet 1,000 mg (1,000 mg Oral Given 07/20/16 2235)  Initial Impression / Assessment and Plan / ED Course  I have reviewed the triage vital signs and the nursing notes.  Pertinent labs & imaging results that were available during my care of the patient were reviewed by me and considered in my medical decision making (see chart for details).  Clinical Course     71 y.o. female presents with chest pain radiating to back starting just PTA. Has h/o stress cardiomyopathy but minimal non-occlusive CAD on last cath. Multiple risk factors for ACS, EKG negative and first troponin negative. Followed by Dr Terrence Dupont. Discussed the case with him and he agrees to see the patient in f/u if serial troponins are negative. Return precautions discussed for worsening or new concerning symptoms.   Final Clinical Impressions(s) / ED Diagnoses   Final diagnoses:  Chest pain with high risk for cardiac etiology    New Prescriptions Discharge Medication List as of 07/21/2016 12:52 AM       Leo Grosser, MD 07/21/16 [phone]

## 2016-07-21 DIAGNOSIS — I2510 Atherosclerotic heart disease of native coronary artery without angina pectoris: Secondary | ICD-10-CM | POA: Diagnosis not present

## 2016-07-21 DIAGNOSIS — I5181 Takotsubo syndrome: Secondary | ICD-10-CM | POA: Diagnosis not present

## 2016-07-21 DIAGNOSIS — E785 Hyperlipidemia, unspecified: Secondary | ICD-10-CM | POA: Diagnosis not present

## 2016-07-21 DIAGNOSIS — I10 Essential (primary) hypertension: Secondary | ICD-10-CM | POA: Diagnosis not present

## 2016-07-21 DIAGNOSIS — I252 Old myocardial infarction: Secondary | ICD-10-CM | POA: Diagnosis not present

## 2016-07-21 LAB — I-STAT TROPONIN, ED: Troponin i, poc: 0 ng/mL (ref 0.00–0.08)

## 2016-07-21 NOTE — ED Notes (Signed)
Pt verbalized understanding discharge instructions and denies any further needs or questions at this time. VS stable, ambulatory and steady gait.   

## 2016-08-18 ENCOUNTER — Telehealth: Payer: Self-pay | Admitting: Internal Medicine

## 2016-08-18 NOTE — Telephone Encounter (Signed)
Called patient to schedule awv. Left msg for patient to call office to scheduled appt.

## 2016-08-23 ENCOUNTER — Encounter: Payer: Self-pay | Admitting: Gastroenterology

## 2016-08-23 ENCOUNTER — Ambulatory Visit (INDEPENDENT_AMBULATORY_CARE_PROVIDER_SITE_OTHER): Payer: Medicare Other | Admitting: Gastroenterology

## 2016-08-23 VITALS — BP 104/70 | HR 80 | Ht 60.0 in | Wt 157.2 lb

## 2016-08-23 DIAGNOSIS — R1310 Dysphagia, unspecified: Secondary | ICD-10-CM | POA: Diagnosis not present

## 2016-08-23 DIAGNOSIS — R0789 Other chest pain: Secondary | ICD-10-CM | POA: Diagnosis not present

## 2016-08-23 DIAGNOSIS — R1319 Other dysphagia: Secondary | ICD-10-CM

## 2016-08-23 NOTE — Progress Notes (Signed)
[facility] Progress Note  Chief Complaint: Chest pain and left upper quadrant pain  Subjective  History:  I last saw Cindy Simpson in November 2017 after her repair of a large hiatal hernia. She continues to have some intermittent "pulling sensation" in the left upper quadrant. She is more bothered by intermittent postprandial chest pain. An episode brought her to the ED on January 2, she ruled out for an MI. She continues to take twice daily PPI because without it she continues to have frequent heartburn. Overall, she her symptoms are so much improved to her fundoplication. She no longer has nocturnal aspiration and her swallowing is much better.  ROS: Weight stable Respiratory: no dyspnea  The patient's Past Medical, Family and Social History were reviewed and are on file in the EMR.  Objective:  Med list reviewed  Vital signs in last 24 hrs: Vitals:   08/23/16 1520  BP: 104/70  Pulse: 80    Physical Exam  Well-appearing elderly woman  HEENT: sclera anicteric, oral mucosa moist without lesions  Neck: supple, no thyromegaly, JVD or lymphadenopathy  Cardiac: RRR without murmurs, S1S2 heard, no peripheral edema  Pulm: clear to auscultation bilaterally, normal RR and effort noted  Abdomen: soft, no tenderness, with active bowel sounds. No guarding or palpable hepatosplenomegaly.  Skin; warm and dry, no jaundice or rash  Recent Labs:  CBC Latest Ref Rng & Units 07/20/2016 06/29/2016 03/10/2016  WBC 4.0 - 10.5 K/uL 10.8(H) 6.7 13.6(H)  Hemoglobin 12.0 - 15.0 g/dL 11.7(L) 12.4 10.3(L)  Hematocrit 36.0 - 46.0 % 35.7(L) 36.7 32.0(L)  Platelets 150 - 400 K/uL 309 345.0 256     Radiologic studies:  Recent two-view chest x-ray shows nothing acute.  @ASSESSMENTPLANBEGIN @ Assessment: Encounter Diagnoses  Name Primary?  . Esophageal dysphagia Yes  . Other chest pain    I suspect this chest pain is likely related to reflux or perhaps an esophageal spasm. She is very concerned  that she may have had recurrence of the hernia despite the repair, but I think this is much less likely.   Plan:  Barium swallow   [name]

## 2016-08-23 NOTE — Patient Instructions (Signed)
If you are age 72 or older, your body mass index should be between 23-30. Your Body mass index is 30.71 kg/m. If this is out of the aforementioned range listed, please consider follow up with your Primary Care Provider.  If you are age 66 or younger, your body mass index should be between 19-25. Your Body mass index is 30.71 kg/m. If this is out of the aformentioned range listed, please consider follow up with your Primary Care Provider.   You have been scheduled for a Barium Esophogram at [facility] (1st floor of the hospital) on 09-01-2016 at 11:30am. Please arrive 15 minutes prior to your appointment for registration. Make certain not to have anything to eat or drink 3 hours prior to your test. If you need to reschedule for any reason, please contact radiology at [phone] to do so. __________________________________________________________________ A barium swallow is an examination that concentrates on views of the esophagus. This tends to be a double contrast exam (barium and two liquids which, when combined, create a gas to distend the wall of the oesophagus) or single contrast (non-ionic iodine based). The study is usually tailored to your symptoms so a good history is essential. Attention is paid during the study to the form, structure and configuration of the esophagus, looking for functional disorders (such as aspiration, dysphagia, achalasia, motility and reflux) EXAMINATION You may be asked to change into a gown, depending on the type of swallow being performed. A radiologist and radiographer will perform the procedure. The radiologist will advise you of the type of contrast selected for your procedure and direct you during the exam. You will be asked to stand, sit or lie in several different positions and to hold a small amount of fluid in your mouth before being asked to swallow while the imaging is performed .In some instances you may be asked to swallow barium coated  marshmallows to assess the motility of a solid food bolus. The exam can be recorded as a digital or video fluoroscopy procedure. POST PROCEDURE It will take 1-2 days for the barium to pass through your system. To facilitate this, it is important, unless otherwise directed, to increase your fluids for the next 24-48hrs and to resume your normal diet.  This test typically takes about 30 minutes to perform. __________________________________________________________________________________   Thank you for choosing [facility]  Dr Wilfrid Lund III

## 2016-08-31 ENCOUNTER — Other Ambulatory Visit (INDEPENDENT_AMBULATORY_CARE_PROVIDER_SITE_OTHER): Payer: Medicare Other

## 2016-08-31 ENCOUNTER — Ambulatory Visit (INDEPENDENT_AMBULATORY_CARE_PROVIDER_SITE_OTHER): Payer: Medicare Other | Admitting: Internal Medicine

## 2016-08-31 ENCOUNTER — Encounter: Payer: Self-pay | Admitting: Internal Medicine

## 2016-08-31 VITALS — BP 116/78 | HR 83 | Temp 98.3°F | Resp 16 | Ht 61.0 in | Wt 156.8 lb

## 2016-08-31 DIAGNOSIS — R531 Weakness: Secondary | ICD-10-CM

## 2016-08-31 DIAGNOSIS — K219 Gastro-esophageal reflux disease without esophagitis: Secondary | ICD-10-CM

## 2016-08-31 DIAGNOSIS — D649 Anemia, unspecified: Secondary | ICD-10-CM

## 2016-08-31 DIAGNOSIS — R946 Abnormal results of thyroid function studies: Secondary | ICD-10-CM

## 2016-08-31 DIAGNOSIS — R079 Chest pain, unspecified: Secondary | ICD-10-CM

## 2016-08-31 DIAGNOSIS — R7989 Other specified abnormal findings of blood chemistry: Secondary | ICD-10-CM

## 2016-08-31 LAB — HEPATIC FUNCTION PANEL
ALBUMIN: 4.4 g/dL (ref 3.5–5.2)
ALT: 14 U/L (ref 0–35)
AST: 18 U/L (ref 0–37)
Alkaline Phosphatase: 103 U/L (ref 39–117)
BILIRUBIN TOTAL: 0.4 mg/dL (ref 0.2–1.2)
Bilirubin, Direct: 0.1 mg/dL (ref 0.0–0.3)
Total Protein: 7.1 g/dL (ref 6.0–8.3)

## 2016-08-31 LAB — CBC WITH DIFFERENTIAL/PLATELET
BASOS ABS: 0.1 10*3/uL (ref 0.0–0.1)
Basophils Relative: 1.2 % (ref 0.0–3.0)
Eosinophils Absolute: 0.2 10*3/uL (ref 0.0–0.7)
Eosinophils Relative: 2.2 % (ref 0.0–5.0)
HCT: 36.8 % (ref 36.0–46.0)
Hemoglobin: 12.5 g/dL (ref 12.0–15.0)
LYMPHS ABS: 1.9 10*3/uL (ref 0.7–4.0)
Lymphocytes Relative: 25.1 % (ref 12.0–46.0)
MCHC: 33.8 g/dL (ref 30.0–36.0)
MCV: 91.8 fl (ref 78.0–100.0)
MONO ABS: 0.9 10*3/uL (ref 0.1–1.0)
MONOS PCT: 11.6 % (ref 3.0–12.0)
NEUTROS ABS: 4.6 10*3/uL (ref 1.4–7.7)
Neutrophils Relative %: 59.9 % (ref 43.0–77.0)
PLATELETS: 337 10*3/uL (ref 150.0–400.0)
RBC: 4.01 Mil/uL (ref 3.87–5.11)
RDW: 13.8 % (ref 11.5–15.5)
WBC: 7.7 10*3/uL (ref 4.0–10.5)

## 2016-08-31 LAB — BASIC METABOLIC PANEL
BUN: 21 mg/dL (ref 6–23)
CO2: 31 meq/L (ref 19–32)
Calcium: 9.7 mg/dL (ref 8.4–10.5)
Chloride: 102 mEq/L (ref 96–112)
Creatinine, Ser: 0.72 mg/dL (ref 0.40–1.20)
GFR: 84.83 mL/min (ref >60.00)
GLUCOSE: 94 mg/dL (ref 70–99)
Potassium: 4.6 mEq/L (ref 3.5–5.1)
Sodium: 138 mEq/L (ref 135–145)

## 2016-08-31 LAB — T4, FREE: Free T4: 0.91 ng/dL (ref 0.60–1.60)

## 2016-08-31 LAB — TSH: TSH: 2.54 u[IU]/mL (ref 0.35–4.50)

## 2016-08-31 NOTE — Assessment & Plan Note (Signed)
2/18 - c/o  "sugar crushes" for 2 months: sweating episodes and "feeling bad" feeling relieved by eating (last 3 weeks ago). No LOC. Last heart cath 08/2015 15% LAD stenosis. S/p Nissen's 02/2016. Labs EKG  Ba swallow test is pending Check CBGs at home

## 2016-08-31 NOTE — Progress Notes (Signed)
Subjective:  Patient ID: Cindy Simpson, female    DOB: 1946/03/25  Age: 71 y.o. MRN: [id number]  CC: Follow-up (HTN, Hyperglycemic)   HPI Cindy Simpson presents for "sugar crushes" for 2 months: sweating episodes and "feeling bad" feeling relieved by eating (last 3 weeks ago). No LOC. Last heart cath 08/2015 15% LAD stenosis. S/p Nissen's 02/2016. Ba swallow test is pending  A poor historian.... C/o L arm pain at times too...  Outpatient Medications Prior to Visit  Medication Sig Dispense Refill  . acetaminophen (TYLENOL) 500 MG tablet Take 500-1,000 mg by mouth every 6 (six) hours as needed (fore pain).     Marland Kitchen aspirin EC 81 MG EC tablet Take 1 tablet (81 mg total) by mouth daily. (Patient taking differently: Take 81 mg by mouth at bedtime. ) 30 tablet 3  . Cholecalciferol 1000 UNITS tablet Take 1,000 Units by mouth daily after supper.     . diphenhydrAMINE (BENADRYL) 50 MG capsule Take 50 mg by mouth at bedtime.    . docusate sodium (COLACE) 100 MG capsule Take 100 mg by mouth 2 (two) times daily after a meal.    . IRON PO Take 1 tablet by mouth daily after supper.     Marland Kitchen KRILL OIL PO Take 1 capsule by mouth daily after supper.     . levothyroxine (SYNTHROID, LEVOTHROID) 75 MCG tablet Take 1 tablet by mouth  daily (Patient taking differently: Take 75 mcg by mouth once a day) 90 tablet 3  . loratadine (CLARITIN) 10 MG tablet Take 10 mg by mouth daily after supper.     . meclizine (ANTIVERT) 12.5 MG tablet Take 1 tablet (12.5 mg total) by mouth 2 (two) times daily as needed for dizziness. 60 tablet 1  . Multiple Vitamin (MULTIVITAMIN WITH MINERALS) TABS tablet Take 1 tablet by mouth daily.    . pantoprazole (PROTONIX) 40 MG tablet Take 1 tablet (40 mg total) by mouth 2 (two) times daily. (Patient taking differently: Take 40 mg by mouth 2 (two) times daily before a meal. ) 180 tablet 3  . Simethicone 125 MG CAPS Take 1-2 capsules by mouth daily as needed (for gas pains).    . valsartan  (DIOVAN) 160 MG tablet Take 1 tablet (160 mg total) by mouth daily. (Patient taking differently: Take 160 mg by mouth at bedtime. ) 90 tablet 3  . carvedilol (COREG) 3.125 MG tablet Take 3.125 mg by mouth 2 (two) times daily.  3   No facility-administered medications prior to visit.     ROS Review of Systems  Constitutional: Negative for activity change, appetite change, chills, fatigue and unexpected weight change.  HENT: Negative for congestion, mouth sores and sinus pressure.   Eyes: Negative for visual disturbance.  Respiratory: Negative for cough and chest tightness.   Gastrointestinal: Negative for abdominal pain and nausea.  Genitourinary: Negative for difficulty urinating, frequency and vaginal pain.  Musculoskeletal: Positive for arthralgias. Negative for back pain, gait problem and neck pain.  Skin: Negative for pallor and rash.  Neurological: Positive for weakness. Negative for dizziness, tremors, numbness and headaches.  Psychiatric/Behavioral: Negative for confusion, decreased concentration and sleep disturbance. The patient is not nervous/anxious.     Objective:  BP 116/78   Pulse 83   Temp 98.3 F (36.8 C) (Oral)   Resp 16   Ht 5\' 1"  (1.549 m)   Wt 156 lb 12 oz (71.1 kg)   SpO2 95%   BMI 29.62 kg/m   BP  Readings from Last 3 Encounters:  08/31/16 116/78  08/23/16 104/70  07/21/16 124/69    Wt Readings from Last 3 Encounters:  08/31/16 156 lb 12 oz (71.1 kg)  08/23/16 157 lb 4 oz (71.3 kg)  07/20/16 154 lb (69.9 kg)    Physical Exam  Constitutional: She appears well-developed. No distress.  HENT:  Head: Normocephalic.  Right Ear: External ear normal.  Left Ear: External ear normal.  Nose: Nose normal.  Mouth/Throat: Oropharynx is clear and moist.  Eyes: Conjunctivae are normal. Pupils are equal, round, and reactive to light. Right eye exhibits no discharge. Left eye exhibits no discharge.  Neck: Normal range of motion. Neck supple. No JVD present. No  tracheal deviation present. No thyromegaly present.  Cardiovascular: Normal rate, regular rhythm and normal heart sounds.   Pulmonary/Chest: No stridor. No respiratory distress. She has no wheezes.  Abdominal: Soft. Bowel sounds are normal. She exhibits no distension and no mass. There is no tenderness. There is no rebound and no guarding.  Musculoskeletal: She exhibits no edema or tenderness.  Lymphadenopathy:    She has no cervical adenopathy.  Neurological: She displays normal reflexes. No cranial nerve deficit. She exhibits normal muscle tone. Coordination normal.  Skin: No rash noted. No erythema.  Psychiatric: She has a normal mood and affect. Her behavior is normal. Judgment and thought content normal.  shoulders NT   Procedure: EKG Indication: L arm pain, weak spells Impression: NSR. No new changes since 2013.  Lab Results  Component Value Date   WBC 10.8 (H) 07/20/2016   HGB 11.7 (L) 07/20/2016   HCT 35.7 (L) 07/20/2016   PLT 309 07/20/2016   GLUCOSE 129 (H) 07/20/2016   CHOL 166 09/14/2015   TRIG 93 09/14/2015   HDL 45 09/14/2015   LDLDIRECT 137.5 03/17/2009   LDLCALC 102 (H) 09/14/2015   ALT 12 06/29/2016   AST 17 06/29/2016   NA 137 07/20/2016   K 4.0 07/20/2016   CL 102 07/20/2016   CREATININE 1.02 (H) 07/20/2016   BUN 21 (H) 07/20/2016   CO2 25 07/20/2016   TSH 4.56 (H) 06/29/2016   INR 0.99 03/03/2016   HGBA1C 5.9 06/29/2016    Dg Chest 2 View  Result Date: 07/20/2016 CLINICAL DATA:  Chest pain 30 minutes prior to arrival, central in nature radiating to the back. Concurrent diarrhea. History of asthma and heart attack. EXAM: CHEST  2 VIEW COMPARISON:  Chest radiograph September 21, 2015 FINDINGS: Cardiomediastinal silhouette is normal. Minimal calcific atherosclerosis aortic arch. No pleural effusions or focal consolidations. Mildly elevated LEFT hemidiaphragm. Trachea projects midline and there is no pneumothorax. Soft tissue planes and included osseous  structures are non-suspicious. Interval fundoplication. Osteopenia. IMPRESSION: No acute cardiopulmonary process. Electronically Signed   By: Awilda Metro M.D.   On: 07/20/2016 22:14    Assessment & Plan:   There are no diagnoses linked to this encounter. I am having Ms. Bodey maintain her Cholecalciferol, loratadine, multivitamin with minerals, aspirin, valsartan, KRILL OIL PO, IRON PO, pantoprazole, levothyroxine, acetaminophen, docusate sodium, diphenhydrAMINE, meclizine, Simethicone, lovastatin, and carvedilol.  Meds ordered this encounter  Medications  . lovastatin (MEVACOR) 10 MG tablet  . carvedilol (COREG) 6.25 MG tablet     Follow-up: No Follow-up on file.  [name], MD

## 2016-08-31 NOTE — Progress Notes (Signed)
Pre-visit discussion using our clinic review tool. No additional management support is needed unless otherwise documented below in the visit note.  

## 2016-08-31 NOTE — Assessment & Plan Note (Signed)
S/p Nissen's Ba swallow pending Protonix

## 2016-09-01 ENCOUNTER — Ambulatory Visit (HOSPITAL_COMMUNITY): Payer: Medicare Other

## 2016-09-06 ENCOUNTER — Ambulatory Visit (HOSPITAL_COMMUNITY)
Admission: RE | Admit: 2016-09-06 | Discharge: 2016-09-06 | Disposition: A | Discharge status: Home / Self Care | Payer: Medicare Other | Source: Ambulatory Visit | Attending: Gastroenterology | Admitting: Gastroenterology

## 2016-09-06 DIAGNOSIS — R1319 Other dysphagia: Secondary | ICD-10-CM

## 2016-09-06 DIAGNOSIS — R1310 Dysphagia, unspecified: Secondary | ICD-10-CM

## 2016-09-06 DIAGNOSIS — K228 Other specified diseases of esophagus: Secondary | ICD-10-CM | POA: Insufficient documentation

## 2016-09-06 DIAGNOSIS — R1314 Dysphagia, pharyngoesophageal phase: Secondary | ICD-10-CM | POA: Diagnosis not present

## 2016-09-06 DIAGNOSIS — R0789 Other chest pain: Secondary | ICD-10-CM

## 2016-09-06 DIAGNOSIS — K224 Dyskinesia of esophagus: Secondary | ICD-10-CM | POA: Diagnosis not present

## 2016-10-06 DIAGNOSIS — Z961 Presence of intraocular lens: Secondary | ICD-10-CM | POA: Diagnosis not present

## 2016-10-06 DIAGNOSIS — H35033 Hypertensive retinopathy, bilateral: Secondary | ICD-10-CM | POA: Diagnosis not present

## 2016-10-06 DIAGNOSIS — H524 Presbyopia: Secondary | ICD-10-CM | POA: Diagnosis not present

## 2016-10-06 DIAGNOSIS — H26491 Other secondary cataract, right eye: Secondary | ICD-10-CM | POA: Diagnosis not present

## 2016-10-11 DIAGNOSIS — I252 Old myocardial infarction: Secondary | ICD-10-CM | POA: Diagnosis not present

## 2016-10-11 DIAGNOSIS — I2510 Atherosclerotic heart disease of native coronary artery without angina pectoris: Secondary | ICD-10-CM | POA: Diagnosis not present

## 2016-10-11 DIAGNOSIS — I5181 Takotsubo syndrome: Secondary | ICD-10-CM | POA: Diagnosis not present

## 2016-10-11 DIAGNOSIS — R0789 Other chest pain: Secondary | ICD-10-CM | POA: Diagnosis not present

## 2016-10-11 DIAGNOSIS — I10 Essential (primary) hypertension: Secondary | ICD-10-CM | POA: Diagnosis not present

## 2016-10-12 ENCOUNTER — Encounter: Payer: Self-pay | Admitting: Internal Medicine

## 2016-10-12 ENCOUNTER — Other Ambulatory Visit (INDEPENDENT_AMBULATORY_CARE_PROVIDER_SITE_OTHER): Payer: Medicare Other

## 2016-10-12 ENCOUNTER — Ambulatory Visit (INDEPENDENT_AMBULATORY_CARE_PROVIDER_SITE_OTHER): Payer: Medicare Other | Admitting: Internal Medicine

## 2016-10-12 DIAGNOSIS — R946 Abnormal results of thyroid function studies: Secondary | ICD-10-CM | POA: Diagnosis not present

## 2016-10-12 DIAGNOSIS — R531 Weakness: Secondary | ICD-10-CM | POA: Diagnosis not present

## 2016-10-12 DIAGNOSIS — E034 Atrophy of thyroid (acquired): Secondary | ICD-10-CM

## 2016-10-12 DIAGNOSIS — R739 Hyperglycemia, unspecified: Secondary | ICD-10-CM

## 2016-10-12 DIAGNOSIS — R5383 Other fatigue: Secondary | ICD-10-CM | POA: Diagnosis not present

## 2016-10-12 DIAGNOSIS — I10 Essential (primary) hypertension: Secondary | ICD-10-CM

## 2016-10-12 DIAGNOSIS — R7989 Other specified abnormal findings of blood chemistry: Secondary | ICD-10-CM

## 2016-10-12 DIAGNOSIS — M25551 Pain in right hip: Secondary | ICD-10-CM | POA: Diagnosis not present

## 2016-10-12 LAB — BASIC METABOLIC PANEL
BUN: 23 mg/dL (ref 6–23)
CALCIUM: 9.5 mg/dL (ref 8.4–10.5)
CO2: 30 mEq/L (ref 19–32)
Chloride: 102 mEq/L (ref 96–112)
Creatinine, Ser: 0.81 mg/dL (ref 0.40–1.20)
GFR: 74.03 mL/min (ref >60.00)
Glucose, Bld: 96 mg/dL (ref 70–99)
POTASSIUM: 4.1 meq/L (ref 3.5–5.1)
Sodium: 139 mEq/L (ref 135–145)

## 2016-10-12 LAB — IBC PANEL
IRON: 85 ug/dL (ref 42–145)
Saturation Ratios: 23.6 % (ref 20.0–50.0)
TRANSFERRIN: 257 mg/dL (ref 212.0–360.0)

## 2016-10-12 LAB — HEMOGLOBIN A1C: HEMOGLOBIN A1C: 6.1 % (ref 4.6–6.5)

## 2016-10-12 MED ORDER — ONETOUCH DELICA LANCETS FINE MISC: 1.0000 | Freq: Every day | 3 refills | Status: DC | PRN

## 2016-10-12 MED ORDER — GLUCOSE BLOOD VI STRP: ORAL_STRIP | 5 refills | Status: DC

## 2016-10-12 NOTE — Assessment & Plan Note (Signed)
CBG was ok, not low

## 2016-10-12 NOTE — Assessment & Plan Note (Signed)
Troch bursitis See procedure

## 2016-10-12 NOTE — Patient Instructions (Signed)
Postprocedure instructions :    A Band-Aid should be left on for 12 hours. Injection therapy is not a cure itself. It is used in conjunction with other modalities. You can use nonsteroidal anti-inflammatories like ibuprofen , hot and cold compresses. Rest is recommended in the next 24 hours. You need to report immediately  if fever, chills or any signs of infection develop. 

## 2016-10-12 NOTE — Progress Notes (Signed)
Subjective:  Patient ID: Cindy Simpson, female    DOB: May 27, 1946  Age: 71 y.o. MRN: [id number]  CC: Hypertension; Fatigue; and Hyperglycemia   HPI Cindy Simpson presents for HTN, R hip pain x long time, worse; sugar crashes - CBG 85 during one episode  Outpatient Medications Prior to Visit  Medication Sig Dispense Refill  . acetaminophen (TYLENOL) 500 MG tablet Take 500-1,000 mg by mouth every 6 (six) hours as needed (fore pain).     Marland Kitchen aspirin EC 81 MG EC tablet Take 1 tablet (81 mg total) by mouth daily. (Patient taking differently: Take 81 mg by mouth at bedtime. ) 30 tablet 3  . carvedilol (COREG) 6.25 MG tablet     . Cholecalciferol 1000 UNITS tablet Take 1,000 Units by mouth daily after supper.     . IRON PO Take 1 tablet by mouth daily after supper.     Marland Kitchen KRILL OIL PO Take 1 capsule by mouth daily after supper.     . levothyroxine (SYNTHROID, LEVOTHROID) 75 MCG tablet Take 1 tablet by mouth  daily (Patient taking differently: Take 75 mcg by mouth once a day) 90 tablet 3  . loratadine (CLARITIN) 10 MG tablet Take 10 mg by mouth daily after supper.     . lovastatin (MEVACOR) 10 MG tablet     . meclizine (ANTIVERT) 12.5 MG tablet Take 1 tablet (12.5 mg total) by mouth 2 (two) times daily as needed for dizziness. 60 tablet 1  . Multiple Vitamin (MULTIVITAMIN WITH MINERALS) TABS tablet Take 1 tablet by mouth daily.    . pantoprazole (PROTONIX) 40 MG tablet Take 1 tablet (40 mg total) by mouth 2 (two) times daily. (Patient taking differently: Take 40 mg by mouth 2 (two) times daily before a meal. ) 180 tablet 3  . Simethicone 125 MG CAPS Take 1-2 capsules by mouth daily as needed (for gas pains).    . valsartan (DIOVAN) 160 MG tablet Take 1 tablet (160 mg total) by mouth daily. (Patient taking differently: Take 160 mg by mouth at bedtime. ) 90 tablet 3  . diphenhydrAMINE (BENADRYL) 50 MG capsule Take 50 mg by mouth at bedtime.    . docusate sodium (COLACE) 100 MG capsule Take 100 mg  by mouth 2 (two) times daily after a meal.     No facility-administered medications prior to visit.     ROS Review of Systems  Constitutional: Positive for fatigue. Negative for activity change, appetite change, chills and unexpected weight change.  HENT: Negative for congestion, mouth sores and sinus pressure.   Eyes: Negative for visual disturbance.  Respiratory: Negative for cough and chest tightness.   Gastrointestinal: Negative for abdominal pain and nausea.  Genitourinary: Negative for difficulty urinating, frequency and vaginal pain.  Musculoskeletal: Positive for arthralgias and gait problem. Negative for back pain.  Skin: Negative for pallor and rash.  Neurological: Negative for dizziness, tremors, weakness, numbness and headaches.  Psychiatric/Behavioral: Negative for confusion and sleep disturbance.  L lat hip - tender over bursa; ROM - good    Procedure Note :     Procedure : Joint Injection,  R  hip   Indication:  Trochanteric bursitis with refractory  chronic pain.   Risks including unsuccessful procedure , bleeding, infection, bruising, skin atrophy, "steroid flare-up" and others were explained to the patient in detail as well as the benefits. Informed consent was obtained and signed.   Tthe patient was placed in a comfortable lateral decubitus position. The point  of maximal tenderness was identified. Skin was prepped with Betadine and alcohol. Then, a 5 cc syringe with a 2 inch long 24-gauge needle was used for a bursa injection.. The needle was advanced  Into the bursa. I injected the bursa with 4 mL of 2% lidocaine and 80 mg of Depo-Medrol .  Band-Aid was applied.   Tolerated well. Complications: None. Good pain relief following the procedure.      Objective:  BP (!) 166/110   Pulse 71   Temp 98.6 F (37 C) (Oral)   Resp 16   Ht 5\' 1"  (1.549 m)   Wt 160 lb 8 oz (72.8 kg)   SpO2 98%   BMI 30.33 kg/m   BP Readings from Last 3 Encounters:  10/12/16 (!)  166/110  08/31/16 116/78  08/23/16 104/70    Wt Readings from Last 3 Encounters:  10/12/16 160 lb 8 oz (72.8 kg)  08/31/16 156 lb 12 oz (71.1 kg)  08/23/16 157 lb 4 oz (71.3 kg)    Physical Exam  Constitutional: She appears well-developed. No distress.  HENT:  Head: Normocephalic.  Right Ear: External ear normal.  Left Ear: External ear normal.  Nose: Nose normal.  Mouth/Throat: Oropharynx is clear and moist.  Eyes: Conjunctivae are normal. Pupils are equal, round, and reactive to light. Right eye exhibits no discharge. Left eye exhibits no discharge.  Neck: Normal range of motion. Neck supple. No JVD present. No tracheal deviation present. No thyromegaly present.  Cardiovascular: Normal rate, regular rhythm and normal heart sounds.   Pulmonary/Chest: No stridor. No respiratory distress. She has no wheezes.  Abdominal: Soft. Bowel sounds are normal. She exhibits no distension and no mass. There is no tenderness. There is no rebound and no guarding.  Musculoskeletal: She exhibits no edema or tenderness.  Lymphadenopathy:    She has no cervical adenopathy.  Neurological: She displays normal reflexes. No cranial nerve deficit. She exhibits normal muscle tone. Coordination normal.  Skin: No rash noted. No erythema.  Psychiatric: She has a normal mood and affect. Her behavior is normal. Judgment and thought content normal.  R groin was tender  Lab Results  Component Value Date   WBC 7.7 08/31/2016   HGB 12.5 08/31/2016   HCT 36.8 08/31/2016   PLT 337.0 08/31/2016   GLUCOSE 94 08/31/2016   CHOL 166 09/14/2015   TRIG 93 09/14/2015   HDL 45 09/14/2015   LDLDIRECT 137.5 03/17/2009   LDLCALC 102 (H) 09/14/2015   ALT 14 08/31/2016   AST 18 08/31/2016   NA 138 08/31/2016   K 4.6 08/31/2016   CL 102 08/31/2016   CREATININE 0.72 08/31/2016   BUN 21 08/31/2016   CO2 31 08/31/2016   TSH 2.54 08/31/2016   INR 0.99 03/03/2016   HGBA1C 5.9 06/29/2016    Dg Esophagus  Result  Date: 09/06/2016 CLINICAL DATA:  Esophageal dysphagia and postprandial chest pain. EXAM: ESOPHOGRAM / BARIUM SWALLOW / BARIUM TABLET STUDY TECHNIQUE: Combined double contrast and single contrast examination performed using effervescent crystals, thick barium liquid, and thin barium liquid. The patient was observed with fluoroscopy swallowing a 13 mm barium sulphate tablet. FLUOROSCOPY TIME:  Fluoroscopy Time:  1 minutes 37 seconds Radiation Exposure Index (if provided by the fluoroscopic device): 10.2 mGy Number of Acquired Spot Images: 0 COMPARISON:  11/12/2015 FINDINGS: Oblique pharyngeal imaging was normal. No obstruction or aspiration noted. Interval repair of hiatal hernia with Nissen fundoplication. The wrap appears normally located and intact. Gas crystal distended the esophagus  above the wrap and reproduced the patient's symptoms. No mucosal lesion is noted. Nonspecific dysmotility with weak distal primary propulsion wave and full column stasis when recumbent. No gastroesophageal reflux noted. A 13 mm barium tablet was not seen to pass through the Nissen wrap. IMPRESSION: 1. Expected appearance after Nissen fundoplication - intact wrap and no recurrent hernia. 2. Nonspecific dysmotility with full column stasis above the wrap when recumbent. 3. Esophageal distention from gas crystals reproduced patient's symptoms. Suspect patient's postprandial pain is related to esophageal distention from delayed passage through the wrap. Electronically Signed   By: Marnee Spring M.D.   On: 09/06/2016 12:19    Assessment & Plan:   There are no diagnoses linked to this encounter. I have discontinued Ms. Aaronson's docusate sodium and diphenhydrAMINE. I am also having her maintain her Cholecalciferol, loratadine, multivitamin with minerals, aspirin, valsartan, KRILL OIL PO, IRON PO, pantoprazole, levothyroxine, acetaminophen, meclizine, Simethicone, lovastatin, and carvedilol.  No orders of the defined types were  placed in this encounter.    Follow-up: No Follow-up on file.  [name], MD

## 2016-10-12 NOTE — Assessment & Plan Note (Signed)
Labs

## 2016-10-12 NOTE — Assessment & Plan Note (Signed)
Levothroid 

## 2016-10-12 NOTE — Assessment & Plan Note (Signed)
Toprol, Diovan

## 2016-10-12 NOTE — Progress Notes (Signed)
Pre-visit discussion using our clinic review tool. No additional management support is needed unless otherwise documented below in the visit note.  

## 2016-10-25 ENCOUNTER — Encounter: Payer: Self-pay | Admitting: Internal Medicine

## 2016-10-25 MED ORDER — GLUCOSE BLOOD VI STRP: ORAL_STRIP | 5 refills | Status: AC

## 2016-10-25 MED ORDER — ACCU-CHEK SOFT TOUCH LANCETS MISC: 5 refills | Status: AC

## 2016-11-29 ENCOUNTER — Ambulatory Visit (INDEPENDENT_AMBULATORY_CARE_PROVIDER_SITE_OTHER): Payer: Medicare Other | Admitting: Family Medicine

## 2016-11-29 ENCOUNTER — Encounter: Payer: Self-pay | Admitting: Family Medicine

## 2016-11-29 VITALS — BP 120/80 | HR 93 | Temp 98.3°F | Ht 61.0 in | Wt 155.2 lb

## 2016-11-29 DIAGNOSIS — B9789 Other viral agents as the cause of diseases classified elsewhere: Secondary | ICD-10-CM | POA: Diagnosis not present

## 2016-11-29 DIAGNOSIS — J069 Acute upper respiratory infection, unspecified: Secondary | ICD-10-CM

## 2016-11-29 MED ORDER — BENZONATATE 100 MG PO CAPS: 100.0000 mg | ORAL_CAPSULE | Freq: Two times a day (BID) | ORAL | 0 refills | Status: DC | PRN

## 2016-11-29 NOTE — Progress Notes (Signed)
Subjective:    Patient ID: Cindy Simpson, female    DOB: 1946/02/01, 71 y.o.   MRN: [id number]  HPI This is a 71 yo female who presents today with sneezing, hacking cough, clear runny nose x 3 days. Started Saturday afternoon after spending the morning doing yard work, including spreading mulch. No fever. Generalized headache, some sinus pressure over eyes. A little sore throat, some "noise" in right ear- popping and cracking. No increased SOB, no wheeze. Took some Robitussin without relief. Husband sick with similar symptoms. Has been on daily Claritin for many years.   Past Medical History:  Diagnosis Date  . Allergic rhinitis   . Anemia   . Asthmatic bronchitis 2009   allergies; post nasal drip but not allergies  . GERD (gastroesophageal reflux disease)   . History of hiatal hernia   . Hyperlipidemia   . Hypothyroidism   . IBS (irritable bowel syndrome)   . Insomnia   . Irregular heart beat Not sure; long time ago   Periodic irregular heart rate; take verapamil.  . Status post dilation of esophageal narrowing   . Torticollis 2006   Estimate date; rec'd botox and resolved   Past Surgical History:  Procedure Laterality Date  . ABDOMINAL HYSTERECTOMY     with ovaries removed also.  Marland Kitchen CARDIAC CATHETERIZATION N/A 09/13/2015   Procedure: Left Heart Cath and Coronary Angiography;  Surgeon: Rinaldo Cloud, MD;  Location: [facility] INVASIVE CV LAB;  Service: Cardiovascular;  Laterality: N/A;  . CATARACT EXTRACTION Bilateral 2014   approx 2 years ago  . CLEFT PALATE REPAIR     child  . ESOPHAGOGASTRODUODENOSCOPY (EGD) WITH PROPOFOL N/A 12/30/2015   Procedure: ESOPHAGOGASTRODUODENOSCOPY (EGD) WITH PROPOFOL;  Surgeon: Sherrilyn Rist, MD;  Location: [facility];  Service: Gastroenterology;  Laterality: N/A;  . HIATAL HERNIA REPAIR N/A 03/09/2016   Procedure: LAPAROSCOPIC REPAIR OF HIATAL HERNIA  NISSEN FUNDOPLICATION UPPER ENDOSCOPY;  Surgeon: Avel Peace, MD;  Location: [facility];   Service: General;  Laterality: N/A;  . NISSEN FUNDOPLICATION N/A 03/09/2016   Procedure: NISSEN FUNDOPLICATION;  Surgeon: Avel Peace, MD;  Location: [facility];  Service: General;  Laterality: N/A;  . TUBAL LIGATION Bilateral 1985  . UPPER GI ENDOSCOPY  03/09/2016   Procedure: UPPER GI ENDOSCOPY;  Surgeon: Avel Peace, MD;  Location: [facility];  Service: General;;   Family History  Problem Relation Age of Onset  . Hypertension Mother   . Heart disease Father   . Stroke Father   . Stroke Brother        brain aneurism  . Colon polyps Sister   . Other Sister        c diff-deceased   Social History  Substance Use Topics  . Smoking status: Never Smoker  . Smokeless tobacco: Never Used  . Alcohol use 0.6 - 1.2 oz/week    1 - 2 Shots of liquor per week     Comment: Perhaps every couple of weeks; not very frequently      Review of Systems Per HPI    Objective:   Physical Exam  Constitutional: She is oriented to person, place, and time. She appears well-developed and well-nourished. No distress.  HENT:  Head: Normocephalic and atraumatic.  Right Ear: External ear and ear canal normal. Tympanic membrane is scarred (thickened).  Left Ear: Tympanic membrane, external ear and ear canal normal.  Nose: Mucosal edema and rhinorrhea present. Right sinus exhibits no maxillary sinus tenderness and no frontal sinus tenderness. Left  sinus exhibits no maxillary sinus tenderness and no frontal sinus tenderness.  Mouth/Throat: Mucous membranes are normal. No oropharyngeal exudate, posterior oropharyngeal edema or posterior oropharyngeal erythema.  Uvula absent due to congenital cleft palate. Sounds congested.   Eyes: Conjunctivae are normal.  Neck: Normal range of motion. Neck supple.  Cardiovascular: Normal rate, regular rhythm and normal heart sounds.   Pulmonary/Chest: Effort normal and breath sounds normal. No respiratory distress. She has no wheezes. She has no rales.  Occasional dry cough  witnessed.   Musculoskeletal: Normal range of motion.  Neurological: She is alert and oriented to person, place, and time.  Skin: Skin is warm and dry. She is not diaphoretic.  Psychiatric: She has a normal mood and affect. Her behavior is normal. Judgment and thought content normal.  Vitals reviewed.     BP 120/80 (BP Location: Right Arm, Patient Position: Sitting, Cuff Size: Normal)   Pulse 93   Temp 98.3 F (36.8 C)   Ht 5\' 1"  (1.549 m)   Wt 155 lb 3.2 oz (70.4 kg)   SpO2 95%   BMI 29.32 kg/m  Wt Readings from Last 3 Encounters:  11/29/16 155 lb 3.2 oz (70.4 kg)  10/12/16 160 lb 8 oz (72.8 kg)  08/31/16 156 lb 12 oz (71.1 kg)       Assessment & Plan:  1. Viral URI with cough - could have some allergic rhinitis component as well - benzonatate (TESSALON) 100 MG capsule; Take 1 capsule (100 mg total) by mouth 2 (two) times daily as needed for cough.  Dispense: 20 capsule; Refill: 0 - suggested she switch to cetirizine to see if she has less nasal draiange - saline nasal spray prn - RTC precautions reveiwed  Olean Ree, FNP-BC  [facility] [place], [facility]  11/29/2016 12:33 PM

## 2016-11-29 NOTE — Patient Instructions (Signed)
Please stop your Claritin and take Cetirizine (generic for zyrtec) daily to see if your drainage improves  Can also use saline nasal spray several times a day for congestion  I have sent a prescription cough pill to your pharmacy  If you are not better by the end of the week or if you develop a fever over 100 or sputum with your cough, please let me or Dr. Alain Marion know.

## 2016-12-06 ENCOUNTER — Telehealth: Payer: Self-pay | Admitting: Internal Medicine

## 2016-12-06 NOTE — Telephone Encounter (Signed)
Called patient to schedule awv. Left vm for patient to call office to schedule appt.

## 2016-12-13 ENCOUNTER — Other Ambulatory Visit: Payer: Self-pay | Admitting: Internal Medicine

## 2016-12-13 DIAGNOSIS — I10 Essential (primary) hypertension: Secondary | ICD-10-CM

## 2017-01-10 DIAGNOSIS — I5181 Takotsubo syndrome: Secondary | ICD-10-CM | POA: Diagnosis not present

## 2017-01-10 DIAGNOSIS — I10 Essential (primary) hypertension: Secondary | ICD-10-CM | POA: Diagnosis not present

## 2017-01-10 DIAGNOSIS — I252 Old myocardial infarction: Secondary | ICD-10-CM | POA: Diagnosis not present

## 2017-01-10 DIAGNOSIS — I2510 Atherosclerotic heart disease of native coronary artery without angina pectoris: Secondary | ICD-10-CM | POA: Diagnosis not present

## 2017-01-10 DIAGNOSIS — E785 Hyperlipidemia, unspecified: Secondary | ICD-10-CM | POA: Diagnosis not present

## 2017-01-17 ENCOUNTER — Ambulatory Visit (INDEPENDENT_AMBULATORY_CARE_PROVIDER_SITE_OTHER): Payer: Medicare Other | Admitting: Internal Medicine

## 2017-01-17 ENCOUNTER — Encounter: Payer: Self-pay | Admitting: Internal Medicine

## 2017-01-17 DIAGNOSIS — K5904 Chronic idiopathic constipation: Secondary | ICD-10-CM | POA: Diagnosis not present

## 2017-01-17 DIAGNOSIS — E034 Atrophy of thyroid (acquired): Secondary | ICD-10-CM | POA: Diagnosis not present

## 2017-01-17 DIAGNOSIS — R739 Hyperglycemia, unspecified: Secondary | ICD-10-CM | POA: Diagnosis not present

## 2017-01-17 DIAGNOSIS — K219 Gastro-esophageal reflux disease without esophagitis: Secondary | ICD-10-CM

## 2017-01-17 DIAGNOSIS — K5900 Constipation, unspecified: Secondary | ICD-10-CM | POA: Insufficient documentation

## 2017-01-17 DIAGNOSIS — I10 Essential (primary) hypertension: Secondary | ICD-10-CM

## 2017-01-17 DIAGNOSIS — L309 Dermatitis, unspecified: Secondary | ICD-10-CM | POA: Diagnosis not present

## 2017-01-17 MED ORDER — ZOSTER VAC RECOMB ADJUVANTED 50 MCG/0.5ML IM SUSR: 0.5000 mL | Freq: Once | INTRAMUSCULAR | 1 refills | Status: AC

## 2017-01-17 MED ORDER — LINACLOTIDE 290 MCG PO CAPS: 290.0000 ug | ORAL_CAPSULE | Freq: Every day | ORAL | 11 refills | Status: DC | PRN

## 2017-01-17 NOTE — Assessment & Plan Note (Signed)
S/p Nissen's

## 2017-01-17 NOTE — Assessment & Plan Note (Signed)
Gluten free trial (no wheat products) for 4-6 weeks. OK to use gluten-free bread and gluten-free pasta.     

## 2017-01-17 NOTE — Assessment & Plan Note (Signed)
On Levothroid 

## 2017-01-17 NOTE — Assessment & Plan Note (Signed)
Metoprolol, Diovan

## 2017-01-17 NOTE — Assessment & Plan Note (Signed)
Linzess prn 

## 2017-01-17 NOTE — Progress Notes (Signed)
Subjective:  Patient ID: Cindy Simpson, female    DOB: 1946/06/19  Age: 71 y.o. MRN: [id number]  CC: No chief complaint on file.   HPI Cindy Simpson presents for HTN, GERD, hypothyroidism f/u. C/o chronic rash on hands. C/o chronic constipation  Outpatient Medications Prior to Visit  Medication Sig Dispense Refill  . acetaminophen (TYLENOL) 500 MG tablet Take 500-1,000 mg by mouth every 6 (six) hours as needed (fore pain).     Marland Kitchen aspirin EC 81 MG EC tablet Take 1 tablet (81 mg total) by mouth daily. (Patient taking differently: Take 81 mg by mouth at bedtime. ) 30 tablet 3  . carvedilol (COREG) 6.25 MG tablet     . Cholecalciferol 1000 UNITS tablet Take 1,000 Units by mouth daily after supper.     Marland Kitchen glucose blood (ACCU-CHEK AVIVA) test strip Use to check blood sugars twice a day 100 each 5  . Lancets (ACCU-CHEK SOFT TOUCH) lancets Use to check blood sugars twice a day 100 each 5  . levothyroxine (SYNTHROID, LEVOTHROID) 75 MCG tablet Take 1 tablet by mouth  daily (Patient taking differently: Take 75 mcg by mouth once a day) 90 tablet 3  . loratadine (CLARITIN) 10 MG tablet Take 10 mg by mouth daily after supper.     . lovastatin (MEVACOR) 10 MG tablet TAKE 1 TABLET BY MOUTH  DAILY 90 tablet 2  . meclizine (ANTIVERT) 12.5 MG tablet Take 1 tablet (12.5 mg total) by mouth 2 (two) times daily as needed for dizziness. 60 tablet 1  . Multiple Vitamin (MULTIVITAMIN WITH MINERALS) TABS tablet Take 1 tablet by mouth daily.    . pantoprazole (PROTONIX) 40 MG tablet Take 1 tablet (40 mg total) by mouth 2 (two) times daily. (Patient taking differently: Take 40 mg by mouth 2 (two) times daily before a meal. ) 180 tablet 3  . Simethicone 125 MG CAPS Take 1-2 capsules by mouth daily as needed (for gas pains).    . valsartan (DIOVAN) 160 MG tablet TAKE 1 TABLET BY MOUTH  DAILY 90 tablet 2  . benzonatate (TESSALON) 100 MG capsule Take 1 capsule (100 mg total) by mouth 2 (two) times daily as needed for  cough. 20 capsule 0  . pantoprazole (PROTONIX) 40 MG tablet TAKE 1 TABLET BY MOUTH TWO  TIMES DAILY 180 tablet 2   No facility-administered medications prior to visit.     ROS Review of Systems  Constitutional: Negative for activity change, appetite change, chills, fatigue and unexpected weight change.  HENT: Negative for congestion, mouth sores and sinus pressure.   Eyes: Negative for visual disturbance.  Respiratory: Negative for cough and chest tightness.   Gastrointestinal: Negative for abdominal pain and nausea.  Genitourinary: Negative for difficulty urinating, frequency and vaginal pain.  Musculoskeletal: Negative for back pain and gait problem.  Skin: Negative for pallor and rash.  Neurological: Negative for dizziness, tremors, weakness, numbness and headaches.  Psychiatric/Behavioral: Negative for confusion and sleep disturbance.    Objective:  BP 116/74 (BP Location: Left Arm, Patient Position: Sitting, Cuff Size: Large)   Pulse 74   Temp 98.5 F (36.9 C) (Oral)   Ht 5\' 1"  (1.549 m)   Wt 156 lb (70.8 kg)   SpO2 99%   BMI 29.48 kg/m   BP Readings from Last 3 Encounters:  01/17/17 116/74  11/29/16 120/80  10/12/16 (!) 166/110    Wt Readings from Last 3 Encounters:  01/17/17 156 lb (70.8 kg)  11/29/16 155 lb  3.2 oz (70.4 kg)  10/12/16 160 lb 8 oz (72.8 kg)    Physical Exam  Constitutional: She appears well-developed. No distress.  HENT:  Head: Normocephalic.  Right Ear: External ear normal.  Left Ear: External ear normal.  Nose: Nose normal.  Mouth/Throat: Oropharynx is clear and moist.  Eyes: Conjunctivae are normal. Pupils are equal, round, and reactive to light. Right eye exhibits no discharge. Left eye exhibits no discharge.  Neck: Normal range of motion. Neck supple. No JVD present. No tracheal deviation present. No thyromegaly present.  Cardiovascular: Normal rate, regular rhythm and normal heart sounds.   Pulmonary/Chest: No stridor. No respiratory  distress. She has no wheezes.  Abdominal: Soft. Bowel sounds are normal. She exhibits no distension and no mass. There is no tenderness. There is no rebound and no guarding.  Musculoskeletal: She exhibits no edema or tenderness.  Lymphadenopathy:    She has no cervical adenopathy.  Neurological: She displays normal reflexes. No cranial nerve deficit. She exhibits normal muscle tone. Coordination normal.  Skin: No rash noted. No erythema.  Psychiatric: She has a normal mood and affect. Her behavior is normal. Judgment and thought content normal.    Lab Results  Component Value Date   WBC 7.7 08/31/2016   HGB 12.5 08/31/2016   HCT 36.8 08/31/2016   PLT 337.0 08/31/2016   GLUCOSE 96 10/12/2016   CHOL 166 09/14/2015   TRIG 93 09/14/2015   HDL 45 09/14/2015   LDLDIRECT 137.5 03/17/2009   LDLCALC 102 (H) 09/14/2015   ALT 14 08/31/2016   AST 18 08/31/2016   NA 139 10/12/2016   K 4.1 10/12/2016   CL 102 10/12/2016   CREATININE 0.81 10/12/2016   BUN 23 10/12/2016   CO2 30 10/12/2016   TSH 2.54 08/31/2016   INR 0.99 03/03/2016   HGBA1C 6.1 10/12/2016    Dg Esophagus  Result Date: 09/06/2016 CLINICAL DATA:  Esophageal dysphagia and postprandial chest pain. EXAM: ESOPHOGRAM / BARIUM SWALLOW / BARIUM TABLET STUDY TECHNIQUE: Combined double contrast and single contrast examination performed using effervescent crystals, thick barium liquid, and thin barium liquid. The patient was observed with fluoroscopy swallowing a 13 mm barium sulphate tablet. FLUOROSCOPY TIME:  Fluoroscopy Time:  1 minutes 37 seconds Radiation Exposure Index (if provided by the fluoroscopic device): 10.2 mGy Number of Acquired Spot Images: 0 COMPARISON:  11/12/2015 FINDINGS: Oblique pharyngeal imaging was normal. No obstruction or aspiration noted. Interval repair of hiatal hernia with Nissen fundoplication. The wrap appears normally located and intact. Gas crystal distended the esophagus above the wrap and reproduced the  patient's symptoms. No mucosal lesion is noted. Nonspecific dysmotility with weak distal primary propulsion wave and full column stasis when recumbent. No gastroesophageal reflux noted. A 13 mm barium tablet was not seen to pass through the Nissen wrap. IMPRESSION: 1. Expected appearance after Nissen fundoplication - intact wrap and no recurrent hernia. 2. Nonspecific dysmotility with full column stasis above the wrap when recumbent. 3. Esophageal distention from gas crystals reproduced patient's symptoms. Suspect patient's postprandial pain is related to esophageal distention from delayed passage through the wrap. Electronically Signed   By: Marnee Spring M.D.   On: 09/06/2016 12:19    Assessment & Plan:   There are no diagnoses linked to this encounter. I have discontinued Cindy Simpson's benzonatate. I am also having her maintain her Cholecalciferol, loratadine, multivitamin with minerals, aspirin, pantoprazole, levothyroxine, acetaminophen, meclizine, Simethicone, carvedilol, glucose blood, accu-chek soft touch, lovastatin, and valsartan.  No orders of the  defined types were placed in this encounter.    Follow-up: No Follow-up on file.  [name], MD

## 2017-01-17 NOTE — Patient Instructions (Signed)
Gluten free trial (no wheat products) for 4-6 weeks. OK to use gluten-free bread and gluten-free pasta.     

## 2017-01-17 NOTE — Assessment & Plan Note (Signed)
Labs

## 2017-01-26 ENCOUNTER — Ambulatory Visit (INDEPENDENT_AMBULATORY_CARE_PROVIDER_SITE_OTHER): Payer: Medicare Other | Admitting: Family Medicine

## 2017-01-26 ENCOUNTER — Encounter: Payer: Self-pay | Admitting: Family Medicine

## 2017-01-26 ENCOUNTER — Other Ambulatory Visit (INDEPENDENT_AMBULATORY_CARE_PROVIDER_SITE_OTHER): Payer: Medicare Other

## 2017-01-26 VITALS — BP 98/64 | HR 91 | Temp 98.2°F | Resp 12 | Ht 61.0 in | Wt 155.0 lb

## 2017-01-26 DIAGNOSIS — I959 Hypotension, unspecified: Secondary | ICD-10-CM

## 2017-01-26 DIAGNOSIS — E039 Hypothyroidism, unspecified: Secondary | ICD-10-CM

## 2017-01-26 DIAGNOSIS — R1013 Epigastric pain: Secondary | ICD-10-CM

## 2017-01-26 DIAGNOSIS — I519 Heart disease, unspecified: Secondary | ICD-10-CM

## 2017-01-26 DIAGNOSIS — K5904 Chronic idiopathic constipation: Secondary | ICD-10-CM | POA: Diagnosis not present

## 2017-01-26 LAB — CBC WITH DIFFERENTIAL/PLATELET
BASOS PCT: 1 % (ref 0.0–3.0)
Basophils Absolute: 0.1 10*3/uL (ref 0.0–0.1)
EOS PCT: 2.7 % (ref 0.0–5.0)
Eosinophils Absolute: 0.3 10*3/uL (ref 0.0–0.7)
HEMATOCRIT: 35.1 % — AB (ref 36.0–46.0)
HEMOGLOBIN: 11.8 g/dL — AB (ref 12.0–15.0)
LYMPHS PCT: 22.6 % (ref 12.0–46.0)
Lymphs Abs: 2.2 10*3/uL (ref 0.7–4.0)
MCHC: 33.5 g/dL (ref 30.0–36.0)
MCV: 94.1 fl (ref 78.0–100.0)
Monocytes Absolute: 1.1 10*3/uL — ABNORMAL HIGH (ref 0.1–1.0)
Monocytes Relative: 11 % (ref 3.0–12.0)
NEUTROS ABS: 6.2 10*3/uL (ref 1.4–7.7)
Neutrophils Relative %: 62.7 % (ref 43.0–77.0)
PLATELETS: 354 10*3/uL (ref 150.0–400.0)
RBC: 3.73 Mil/uL — ABNORMAL LOW (ref 3.87–5.11)
RDW: 12.8 % (ref 11.5–15.5)
WBC: 9.8 10*3/uL (ref 4.0–10.5)

## 2017-01-26 LAB — COMPREHENSIVE METABOLIC PANEL
ALBUMIN: 4 g/dL (ref 3.5–5.2)
ALT: 12 U/L (ref 0–35)
AST: 14 U/L (ref 0–37)
Alkaline Phosphatase: 91 U/L (ref 39–117)
BUN: 34 mg/dL — AB (ref 6–23)
CALCIUM: 9.5 mg/dL (ref 8.4–10.5)
CHLORIDE: 104 meq/L (ref 96–112)
CO2: 27 meq/L (ref 19–32)
CREATININE: 1.11 mg/dL (ref 0.40–1.20)
GFR: 51.42 mL/min — ABNORMAL LOW (ref >60.00)
Glucose, Bld: 83 mg/dL (ref 70–99)
POTASSIUM: 4.1 meq/L (ref 3.5–5.1)
SODIUM: 137 meq/L (ref 135–145)
Total Bilirubin: 0.4 mg/dL (ref 0.2–1.2)
Total Protein: 6.9 g/dL (ref 6.0–8.3)

## 2017-01-26 NOTE — Patient Instructions (Signed)
Please continue probiotic- take full dose for 2 weeks to see if better  Use miralax every day (1/2- 2 doses) as needed for constipation  We will notify you of your lab results

## 2017-01-28 NOTE — Progress Notes (Signed)
Subjective:    Patient ID: Cindy Simpson, female    DOB: 21-May-1946, 71 y.o.   MRN: [id number]  HPI This is a 71 yo female who presents today with constipation/diarrhea. Was seen 01/17/17 and started on Linzess. She had diarrhea with Linzess. She stopped Linzess after one dose and diarrhea stopped. She had a good bowel movement yesterday, but feels bad. No stomach pain today, but has been achy. Has been very gassy. No blood or mucus. Light brown. Has constant feeling that she needs to sit on the toilet, but then just passes gas. No fever, no dizziness.  Has had some episodes where she thinks her sugar has dropped. Happened last night around 9:30, had last eaten at 5- pizza and lemon tart for dinner. Ate snack and felt better.  Good fluid intake. Has been taking probiotic for about 2 months, taking 1/2 dose.   Past Medical History:  Diagnosis Date  . Allergic rhinitis   . Anemia   . Asthmatic bronchitis 2009   allergies; post nasal drip but not allergies  . GERD (gastroesophageal reflux disease)   . History of hiatal hernia   . Hyperlipidemia   . Hypothyroidism   . IBS (irritable bowel syndrome)   . Insomnia   . Irregular heart beat Not sure; long time ago   Periodic irregular heart rate; take verapamil.  . Status post dilation of esophageal narrowing   . Torticollis 2006   Estimate date; rec'd botox and resolved   Past Surgical History:  Procedure Laterality Date  . ABDOMINAL HYSTERECTOMY     with ovaries removed also.  Marland Kitchen CARDIAC CATHETERIZATION N/A 09/13/2015   Procedure: Left Heart Cath and Coronary Angiography;  Surgeon: Rinaldo Cloud, MD;  Location: [facility] INVASIVE CV LAB;  Service: Cardiovascular;  Laterality: N/A;  . CATARACT EXTRACTION Bilateral 2014   approx 2 years ago  . CLEFT PALATE REPAIR     child  . ESOPHAGOGASTRODUODENOSCOPY (EGD) WITH PROPOFOL N/A 12/30/2015   Procedure: ESOPHAGOGASTRODUODENOSCOPY (EGD) WITH PROPOFOL;  Surgeon: Sherrilyn Rist, MD;  Location: [facility]  ENDOSCOPY;  Service: Gastroenterology;  Laterality: N/A;  . HIATAL HERNIA REPAIR N/A 03/09/2016   Procedure: LAPAROSCOPIC REPAIR OF HIATAL HERNIA  NISSEN FUNDOPLICATION UPPER ENDOSCOPY;  Surgeon: Avel Peace, MD;  Location: [facility] ORS;  Service: General;  Laterality: N/A;  . NISSEN FUNDOPLICATION N/A 03/09/2016   Procedure: NISSEN FUNDOPLICATION;  Surgeon: Avel Peace, MD;  Location: [facility] ORS;  Service: General;  Laterality: N/A;  . TUBAL LIGATION Bilateral 1985  . UPPER GI ENDOSCOPY  03/09/2016   Procedure: UPPER GI ENDOSCOPY;  Surgeon: Avel Peace, MD;  Location: [facility] ORS;  Service: General;;   Family History  Problem Relation Age of Onset  . Hypertension Mother   . Heart disease Father   . Stroke Father   . Stroke Brother        brain aneurism  . Colon polyps Sister   . Other Sister        c diff-deceased   Social History  Substance Use Topics  . Smoking status: Never Smoker  . Smokeless tobacco: Never Used  . Alcohol use 0.6 - 1.2 oz/week    1 - 2 Shots of liquor per week     Comment: Perhaps every couple of weeks; not very frequently      Review of Systems Per HPI    Objective:   Physical Exam  Constitutional: She is oriented to person, place, and time. She appears well-developed and well-nourished. No distress.  HENT:  Head: Normocephalic and atraumatic.  Cardiovascular: Normal rate and regular rhythm.   Pulmonary/Chest: Effort normal and breath sounds normal.  Abdominal: Soft. Bowel sounds are normal. She exhibits no distension. There is tenderness (mild, generalized). There is no rebound and no guarding.  Musculoskeletal: She exhibits no edema.  Neurological: She is alert and oriented to person, place, and time.  Skin: Skin is warm and dry. She is not diaphoretic.  Psychiatric: She has a normal mood and affect. Her behavior is normal. Judgment and thought content normal.      BP 98/64 (BP Location: Left Arm, Patient Position: Sitting, Cuff Size: Normal)    Pulse 91   Temp 98.2 F (36.8 C) (Oral)   Resp 12   Ht 5\' 1"  (1.549 m)   Wt 155 lb (70.3 kg)   SpO2 99%   BMI 29.29 kg/m  Wt Readings from Last 3 Encounters:  01/26/17 155 lb (70.3 kg)  01/17/17 156 lb (70.8 kg)  11/29/16 155 lb 3.2 oz (70.4 kg)   BP Readings from Last 3 Encounters:  01/26/17 98/64  01/17/17 116/74  11/29/16 120/80       Assessment & Plan:  1. Epigastric pain - bland diet - CBC - Comprehensive metabolic panel  2. Chronic idiopathic constipation - increase probiotic to full dose, discussed using Miralax 1/2 -2 doses daily prn   3. Hypotension, unspecified hypotension type - blood pressure down a little, instructed her to increase fluids - CBC - Comprehensive metabolic panel - RTC precautions reviewed   Olean Ree, FNP-BC  [facility]  01/28/2017 6:43 PM

## 2017-01-31 ENCOUNTER — Other Ambulatory Visit (INDEPENDENT_AMBULATORY_CARE_PROVIDER_SITE_OTHER): Payer: Medicare Other

## 2017-01-31 DIAGNOSIS — R079 Chest pain, unspecified: Secondary | ICD-10-CM | POA: Diagnosis not present

## 2017-01-31 DIAGNOSIS — I10 Essential (primary) hypertension: Secondary | ICD-10-CM | POA: Diagnosis not present

## 2017-01-31 DIAGNOSIS — D649 Anemia, unspecified: Secondary | ICD-10-CM

## 2017-01-31 LAB — BASIC METABOLIC PANEL
BUN: 22 mg/dL (ref 6–23)
CHLORIDE: 100 meq/L (ref 96–112)
CO2: 28 meq/L (ref 19–32)
CREATININE: 0.96 mg/dL (ref 0.40–1.20)
Calcium: 9.4 mg/dL (ref 8.4–10.5)
GFR: 60.8 mL/min (ref >60.00)
Glucose, Bld: 122 mg/dL — ABNORMAL HIGH (ref 70–99)
Potassium: 4.2 mEq/L (ref 3.5–5.1)
SODIUM: 136 meq/L (ref 135–145)

## 2017-01-31 LAB — IBC PANEL
Iron: 84 ug/dL (ref 42–145)
SATURATION RATIOS: 24.2 % (ref 20.0–50.0)
Transferrin: 248 mg/dL (ref 212.0–360.0)

## 2017-02-14 DIAGNOSIS — Z1231 Encounter for screening mammogram for malignant neoplasm of breast: Secondary | ICD-10-CM | POA: Diagnosis not present

## 2017-02-14 LAB — HM MAMMOGRAPHY

## 2017-03-03 ENCOUNTER — Encounter: Payer: Self-pay | Admitting: Internal Medicine

## 2017-03-09 ENCOUNTER — Other Ambulatory Visit: Payer: Self-pay | Admitting: Internal Medicine

## 2017-03-25 DIAGNOSIS — E039 Hypothyroidism, unspecified: Secondary | ICD-10-CM | POA: Diagnosis not present

## 2017-03-25 DIAGNOSIS — M25551 Pain in right hip: Secondary | ICD-10-CM | POA: Diagnosis not present

## 2017-04-05 ENCOUNTER — Emergency Department (HOSPITAL_COMMUNITY)
Admission: EM | Admit: 2017-04-05 | Discharge: 2017-04-05 | Disposition: A | Discharge status: Home / Self Care | Payer: Medicare Other | Attending: Emergency Medicine | Admitting: Emergency Medicine

## 2017-04-05 ENCOUNTER — Emergency Department (HOSPITAL_COMMUNITY): Payer: Medicare Other

## 2017-04-05 ENCOUNTER — Encounter (HOSPITAL_COMMUNITY): Payer: Self-pay | Admitting: Emergency Medicine

## 2017-04-05 DIAGNOSIS — Z79899 Other long term (current) drug therapy: Secondary | ICD-10-CM | POA: Insufficient documentation

## 2017-04-05 DIAGNOSIS — R1030 Lower abdominal pain, unspecified: Secondary | ICD-10-CM | POA: Diagnosis not present

## 2017-04-05 DIAGNOSIS — K5732 Diverticulitis of large intestine without perforation or abscess without bleeding: Secondary | ICD-10-CM | POA: Insufficient documentation

## 2017-04-05 DIAGNOSIS — R109 Unspecified abdominal pain: Secondary | ICD-10-CM | POA: Diagnosis not present

## 2017-04-05 DIAGNOSIS — Z7982 Long term (current) use of aspirin: Secondary | ICD-10-CM | POA: Diagnosis not present

## 2017-04-05 DIAGNOSIS — I10 Essential (primary) hypertension: Secondary | ICD-10-CM | POA: Insufficient documentation

## 2017-04-05 DIAGNOSIS — R197 Diarrhea, unspecified: Secondary | ICD-10-CM | POA: Diagnosis not present

## 2017-04-05 DIAGNOSIS — R1032 Left lower quadrant pain: Secondary | ICD-10-CM | POA: Diagnosis present

## 2017-04-05 DIAGNOSIS — E039 Hypothyroidism, unspecified: Secondary | ICD-10-CM | POA: Insufficient documentation

## 2017-04-05 DIAGNOSIS — K5792 Diverticulitis of intestine, part unspecified, without perforation or abscess without bleeding: Secondary | ICD-10-CM

## 2017-04-05 LAB — COMPREHENSIVE METABOLIC PANEL
ALT: 16 U/L (ref 14–54)
AST: 21 U/L (ref 15–41)
Albumin: 4.2 g/dL (ref 3.5–5.0)
Alkaline Phosphatase: 90 U/L (ref 38–126)
Anion gap: 12 (ref 5–15)
BUN: 25 mg/dL — ABNORMAL HIGH (ref 6–20)
CO2: 24 mmol/L (ref 22–32)
Calcium: 9.7 mg/dL (ref 8.9–10.3)
Chloride: 99 mmol/L — ABNORMAL LOW (ref 101–111)
Creatinine, Ser: 1.26 mg/dL — ABNORMAL HIGH (ref 0.44–1.00)
GFR calc Af Amer: 48 mL/min — ABNORMAL LOW (ref >60)
GFR calc non Af Amer: 42 mL/min — ABNORMAL LOW (ref >60)
Glucose, Bld: 91 mg/dL (ref 65–99)
Potassium: 4.2 mmol/L (ref 3.5–5.1)
Sodium: 135 mmol/L (ref 135–145)
Total Bilirubin: 0.7 mg/dL (ref 0.3–1.2)
Total Protein: 7 g/dL (ref 6.5–8.1)

## 2017-04-05 LAB — URINALYSIS, ROUTINE W REFLEX MICROSCOPIC
Bilirubin Urine: NEGATIVE
Glucose, UA: NEGATIVE mg/dL
Hgb urine dipstick: NEGATIVE
Ketones, ur: NEGATIVE mg/dL
Leukocytes, UA: NEGATIVE
Nitrite: NEGATIVE
Protein, ur: NEGATIVE mg/dL
Specific Gravity, Urine: 1.021 (ref 1.005–1.030)
pH: 8 (ref 5.0–8.0)

## 2017-04-05 LAB — I-STAT CG4 LACTIC ACID, ED
Lactic Acid, Venous: 2.19 mmol/L (ref 0.5–1.9)
Lactic Acid, Venous: 1.3 mmol/L (ref 0.5–1.9)

## 2017-04-05 LAB — CBC
HCT: 36.8 % (ref 36.0–46.0)
Hemoglobin: 12.3 g/dL (ref 12.0–15.0)
MCH: 31.4 pg (ref 26.0–34.0)
MCHC: 33.4 g/dL (ref 30.0–36.0)
MCV: 93.9 fL (ref 78.0–100.0)
Platelets: 375 K/uL (ref 150–400)
RBC: 3.92 MIL/uL (ref 3.87–5.11)
RDW: 13.3 % (ref 11.5–15.5)
WBC: 29.8 K/uL — ABNORMAL HIGH (ref 4.0–10.5)

## 2017-04-05 LAB — DIFFERENTIAL
Basophils Absolute: 0 K/uL (ref 0.0–0.1)
Basophils Relative: 0 %
Eosinophils Absolute: 0.1 K/uL (ref 0.0–0.7)
Eosinophils Relative: 0 %
Lymphocytes Relative: 4 %
Lymphs Abs: 1.2 K/uL (ref 0.7–4.0)
Monocytes Absolute: 1 K/uL (ref 0.1–1.0)
Monocytes Relative: 3 %
Neutro Abs: 27.9 K/uL — ABNORMAL HIGH (ref 1.7–7.7)
Neutrophils Relative %: 93 %

## 2017-04-05 LAB — LIPASE, BLOOD: Lipase: 26 U/L (ref 11–51)

## 2017-04-05 MED ORDER — IOPAMIDOL (ISOVUE-300) INJECTION 61%
INTRAVENOUS | Status: AC
  Filled 2017-04-05: qty 100

## 2017-04-05 MED ORDER — IOPAMIDOL (ISOVUE-300) INJECTION 61%
100.0000 mL | Freq: Once | INTRAVENOUS | Status: AC | PRN
  Administered 2017-04-05: 80 mL via INTRAVENOUS

## 2017-04-05 MED ORDER — CIPROFLOXACIN HCL 500 MG PO TABS: 500.0000 mg | ORAL_TABLET | Freq: Two times a day (BID) | ORAL | 0 refills | Status: AC

## 2017-04-05 MED ORDER — ONDANSETRON HCL 4 MG/2ML IJ SOLN
4.0000 mg | Freq: Once | INTRAMUSCULAR | Status: AC
  Administered 2017-04-05: 4 mg via INTRAVENOUS
  Filled 2017-04-05: qty 2

## 2017-04-05 MED ORDER — MORPHINE SULFATE (PF) 4 MG/ML IV SOLN
4.0000 mg | Freq: Once | INTRAVENOUS | Status: AC | PRN
  Administered 2017-04-05: 4 mg via INTRAVENOUS
  Filled 2017-04-05: qty 1

## 2017-04-05 MED ORDER — CIPROFLOXACIN IN D5W 400 MG/200ML IV SOLN
400.0000 mg | Freq: Once | INTRAVENOUS | Status: AC
  Administered 2017-04-05: 400 mg via INTRAVENOUS
  Filled 2017-04-05: qty 200

## 2017-04-05 MED ORDER — SODIUM CHLORIDE 0.9 % IV BOLUS (SEPSIS)
1000.0000 mL | Freq: Once | INTRAVENOUS | Status: AC
  Administered 2017-04-05: 1000 mL via INTRAVENOUS

## 2017-04-05 MED ORDER — METRONIDAZOLE IN NACL 5-0.79 MG/ML-% IV SOLN
500.0000 mg | Freq: Once | INTRAVENOUS | Status: AC
  Administered 2017-04-05: 500 mg via INTRAVENOUS
  Filled 2017-04-05: qty 100

## 2017-04-05 MED ORDER — ONDANSETRON 4 MG PO TBDP: 4.0000 mg | ORAL_TABLET | Freq: Three times a day (TID) | ORAL | 0 refills | Status: DC | PRN

## 2017-04-05 MED ORDER — PROMETHAZINE HCL 25 MG/ML IJ SOLN
12.5000 mg | Freq: Once | INTRAMUSCULAR | Status: AC
  Administered 2017-04-05: 12.5 mg via INTRAVENOUS
  Filled 2017-04-05: qty 1

## 2017-04-05 MED ORDER — METRONIDAZOLE 500 MG PO TABS: 500.0000 mg | ORAL_TABLET | Freq: Three times a day (TID) | ORAL | 0 refills | Status: AC

## 2017-04-05 NOTE — Discharge Instructions (Signed)
Please take all of your antibiotics until finished!   You may develop abdominal discomfort or diarrhea from the antibiotic.  You may help offset this with probiotics which you can buy or get in yogurt. Do not eat  or take the probiotics until 2 hours after your antibiotic.  Take Zofran as needed for nausea. Drink plenty of fluids and get plenty of rest. Eat a diet of bland foods that will not upset your stomach. You may take Tylenol every 6 hours as needed for pain. Follow-up with your primary care physician in the next few days for reevaluation of your symptoms. Return to the ED immediately if any concerning signs or symptoms develop such as fevers, blood in your urine or stool, or if you are unable to keep any food or drink down.

## 2017-04-05 NOTE — ED Triage Notes (Signed)
Per [facility] pt from home c/o 4 episodes of diarrhea today. Pt states hx of GI issues between constipation and diarrhea. Pt c/o left sided abdominal cramping. No N/V. No orthostatic changes.

## 2017-04-05 NOTE — ED Notes (Signed)
Discharge instructions reviewed with patient. Patient verbalizes understanding. VSS.   

## 2017-04-05 NOTE — ED Notes (Signed)
RN AND MD NOTIFIED OF PATIENT'S LACTIC ACID LEVEL OF 2.19

## 2017-04-05 NOTE — ED Provider Notes (Signed)
[facility] DEPT Provider Note   CSN: [id number] Arrival date & time: 04/05/17  1400     History   Chief Complaint Chief Complaint  Patient presents with  . Abdominal Pain  . Diarrhea    HPI Cindy Simpson is a 71 y.o. female with history of HLD, hypothyroidism, GERD, IBS who presents today with chief complaint acute onset, progressive improving abdominal pain with associated diarrhea for one day. She states she has had 4 episodes of nonbloody watery stools today. She states that she frequently cycles through periods of constipation followed by diarrhea, and noted multiple episodes of watery nonbloody stools beginning today. She endorses dull crampy abdominal pain which is worst in the left lower quadrant. He endorses on palpation. No alleviating factors noted. She is not tried any medications for her symptoms. She denies vomiting but endorses she spit up water earlier today as well as intermittent nausea. Denies fevers, chest pain, shortness of breath, urinary symptoms, melena, hematochezia. She has had a prior history of hernia repair. Denies it suspicious food intake or sick contacts. No recent abx treatment. Of note, patient states she took a 5 day course of steroids for right hip pain 1.5 weeks ago.    The history is provided by the patient.    Past Medical History:  Diagnosis Date  . Allergic rhinitis   . Anemia   . Asthmatic bronchitis 2009   allergies; post nasal drip but not allergies  . GERD (gastroesophageal reflux disease)   . History of hiatal hernia   . Hyperlipidemia   . Hypothyroidism   . IBS (irritable bowel syndrome)   . Insomnia   . Irregular heart beat Not sure; long time ago   Periodic irregular heart rate; take verapamil.  . Status post dilation of esophageal narrowing   . Torticollis 2006   Estimate date; rec'd botox and resolved    Patient Active Problem List   Diagnosis Date Noted  . Constipation 01/17/2017  . Right hip pain 10/12/2016  .  Chest wall pain 06/29/2016  . Hiatal hernia with gastroesophageal reflux 03/09/2016  . Deficiency anemia 11/03/2015  . Hyperglycemia 11/03/2015  . Eczema of both hands 02/28/2015  . Heart murmur 02/28/2015  . Onychomycosis 02/18/2014  . Callus of foot 02/18/2014  . Dizziness 08/14/2013  . Cheilitis 10/16/2012  . Hypokalemia 06/22/2012  . Leukocytosis 06/22/2012  . Viral gastroenteritis with intractable nausea and vomiting 06/22/2012  . Well adult exam 04/17/2012  . Right shoulder pain 04/17/2012  . Hypertension 10/16/2010  . HIP PAIN 03/24/2010  . Acute upper respiratory infection 10/20/2009  . ARTHRALGIA 10/20/2009  . Spell of generalized weakness 10/20/2009  . Hypothyroidism 03/15/2008  . Dyslipidemia 03/15/2008  . INSOMNIA, PERSISTENT 03/15/2008  . ALLERGIC RHINITIS 03/15/2008  . Asthma 03/15/2008  . GERD 03/15/2008    Past Surgical History:  Procedure Laterality Date  . ABDOMINAL HYSTERECTOMY     with ovaries removed also.  Marland Kitchen CARDIAC CATHETERIZATION N/A 09/13/2015   Procedure: Left Heart Cath and Coronary Angiography;  Surgeon: Charolette Forward, MD;  Location: [facility] CV LAB;  Service: Cardiovascular;  Laterality: N/A;  . CATARACT EXTRACTION Bilateral 2014   approx 2 years ago  . CLEFT PALATE REPAIR     child  . ESOPHAGOGASTRODUODENOSCOPY (EGD) WITH PROPOFOL N/A 12/30/2015   Procedure: ESOPHAGOGASTRODUODENOSCOPY (EGD) WITH PROPOFOL;  Surgeon: Doran Stabler, MD;  Location: [facility] ENDOSCOPY;  Service: Gastroenterology;  Laterality: N/A;  . HIATAL HERNIA REPAIR N/A 03/09/2016   Procedure: LAPAROSCOPIC REPAIR  OF HIATAL HERNIA  NISSEN FUNDOPLICATION UPPER ENDOSCOPY;  Surgeon: Jackolyn Confer, MD;  Location: [facility] ORS;  Service: General;  Laterality: N/A;  . NISSEN FUNDOPLICATION N/A 2/54/2706   Procedure: NISSEN FUNDOPLICATION;  Surgeon: Jackolyn Confer, MD;  Location: [facility] ORS;  Service: General;  Laterality: N/A;  . TUBAL LIGATION Bilateral 1985  . UPPER GI ENDOSCOPY  03/09/2016    Procedure: UPPER GI ENDOSCOPY;  Surgeon: Jackolyn Confer, MD;  Location: [facility] ORS;  Service: General;;    OB History    No data available       Home Medications    Prior to Admission medications   Medication Sig Start Date End Date Taking? Authorizing Provider  acetaminophen (TYLENOL) 500 MG tablet Take 500-1,000 mg by mouth every 6 (six) hours as needed (fore pain).    Yes [provider]  aspirin EC 81 MG EC tablet Take 1 tablet (81 mg total) by mouth daily. Patient taking differently: Take 81 mg by mouth at bedtime.  09/14/15  Yes Charolette Forward, MD  carvedilol (COREG) 6.25 MG tablet Take 6.25 mg by mouth 2 (two) times daily with a meal.  07/21/16  Yes [provider]  Cholecalciferol 1000 UNITS tablet Take 1,000 Units by mouth daily after supper.  10/13/10  Yes [name], Evie Lacks, MD  levothyroxine (SYNTHROID, LEVOTHROID) 75 MCG tablet TAKE 1 TABLET BY MOUTH  DAILY 03/10/17  Yes [name], Evie Lacks, MD  loratadine (CLARITIN) 10 MG tablet Take 10 mg by mouth daily after supper.    Yes [provider]  lovastatin (MEVACOR) 10 MG tablet TAKE 1 TABLET BY MOUTH  DAILY 12/14/16  Yes [name], Evie Lacks, MD  Multiple Vitamin (MULTIVITAMIN WITH MINERALS) TABS tablet Take 1 tablet by mouth daily.   Yes [provider]  pantoprazole (PROTONIX) 40 MG tablet Take 1 tablet (40 mg total) by mouth 2 (two) times daily. Patient taking differently: Take 40 mg by mouth 2 (two) times daily before a meal.  12/31/15  Yes [name], Evie Lacks, MD  Simethicone 125 MG CAPS Take 1-2 capsules by mouth daily as needed (for gas pains).   Yes [provider]  valsartan (DIOVAN) 160 MG tablet TAKE 1 TABLET BY MOUTH  DAILY 12/14/16  Yes [name], Evie Lacks, MD  ciprofloxacin (CIPRO) 500 MG tablet Take 1 tablet (500 mg total) by mouth every 12 (twelve) hours. 04/05/17 04/15/17  Rodell Perna A, PA-C  glucose blood (ACCU-CHEK AVIVA) test strip Use to check blood sugars twice a day  10/25/16   [name], Evie Lacks, MD  Lancets (ACCU-CHEK SOFT TOUCH) lancets Use to check blood sugars twice a day 10/25/16   [name], Evie Lacks, MD  meclizine (ANTIVERT) 12.5 MG tablet Take 1 tablet (12.5 mg total) by mouth 2 (two) times daily as needed for dizziness. Patient not taking: Reported on 04/05/2017 05/18/16 05/18/17  [name], Evie Lacks, MD  metroNIDAZOLE (FLAGYL) 500 MG tablet Take 1 tablet (500 mg total) by mouth 3 (three) times daily. 04/05/17 04/15/17  Rodell Perna A, PA-C  ondansetron (ZOFRAN ODT) 4 MG disintegrating tablet Take 1 tablet (4 mg total) by mouth every 8 (eight) hours as needed for nausea or vomiting. 04/05/17   Renita Papa, PA-C    Family History Family History  Problem Relation Age of Onset  . Hypertension Mother   . Heart disease Father   . Stroke Father   . Stroke Brother        brain aneurism  . Colon polyps Sister   .  Other Sister        c diff-deceased    Social History Social History  Substance Use Topics  . Smoking status: Never Smoker  . Smokeless tobacco: Never Used  . Alcohol use 0.6 - 1.2 oz/week    1 - 2 Shots of liquor per week     Comment: Perhaps every couple of weeks; not very frequently     Allergies   Lisinopril; Vinegar [acetic acid]; and Metoclopramide hcl   Review of Systems Review of Systems  Constitutional: Negative for chills and fever.  Respiratory: Negative for shortness of breath.   Cardiovascular: Negative for chest pain.  Gastrointestinal: Positive for abdominal pain, diarrhea and nausea. Negative for blood in stool, constipation and vomiting.  Genitourinary: Negative for decreased urine volume, flank pain and hematuria.  All other systems reviewed and are negative.    Physical Exam Updated Vital Signs BP 113/61   Pulse 64   Temp 98 F (36.7 C) (Oral)   Resp 17   Ht 5\' 1"  (1.549 m)   Wt 70.8 kg (156 lb)   SpO2 98%   BMI 29.48 kg/m   Physical Exam  Constitutional: She appears well-developed and  well-nourished. No distress.  HENT:  Head: Normocephalic and atraumatic.  Eyes: Conjunctivae are normal. Right eye exhibits no discharge. Left eye exhibits no discharge.  Neck: Normal range of motion. Neck supple. No JVD present. No tracheal deviation present.  Cardiovascular: Normal rate, regular rhythm, normal heart sounds and intact distal pulses.   Pulmonary/Chest: Effort normal and breath sounds normal.  Abdominal: Soft. Bowel sounds are normal. She exhibits no distension. There is tenderness.  Well-healed midline surgical incision to the abdomen.  maximally tender to palpation in the left lower quadrant. Murphy sign absent, Rovsing sign absent, no CVA tenderness.   Musculoskeletal: She exhibits no edema.  Neurological: She is alert.  Skin: Skin is warm and dry. No erythema.  Psychiatric: She has a normal mood and affect. Her behavior is normal.  Nursing note and vitals reviewed.    ED Treatments / Results  Labs (all labs ordered are listed, but only abnormal results are displayed) Labs Reviewed  COMPREHENSIVE METABOLIC PANEL - Abnormal; Notable for the following:       Result Value   Chloride 99 (*)    BUN 25 (*)    Creatinine, Ser 1.26 (*)    GFR calc non Af Amer 42 (*)    GFR calc Af Amer 48 (*)    All other components within normal limits  CBC - Abnormal; Notable for the following:    WBC 29.8 (*)    All other components within normal limits  DIFFERENTIAL - Abnormal; Notable for the following:    Neutro Abs 27.9 (*)    All other components within normal limits  I-STAT CG4 LACTIC ACID, ED - Abnormal; Notable for the following:    Lactic Acid, Venous 2.19 (*)    All other components within normal limits  LIPASE, BLOOD  URINALYSIS, ROUTINE W REFLEX MICROSCOPIC  I-STAT CG4 LACTIC ACID, ED    EKG  EKG Interpretation None       Radiology Ct Abdomen Pelvis W Contrast  Result Date: 04/05/2017 CLINICAL DATA:  Abdominal pain, constipation and diarrhea. EXAM: CT  ABDOMEN AND PELVIS WITH CONTRAST TECHNIQUE: Multidetector CT imaging of the abdomen and pelvis was performed using the standard protocol following bolus administration of intravenous contrast. CONTRAST:  17mL ISOVUE-300 IOPAMIDOL (ISOVUE-300) INJECTION 61% COMPARISON:  None. FINDINGS: Lower chest:  Minor right base atelectasis versus scarring. No significant lower lobe pneumonia or acute process. Nissen fundoplication repair noted. No significant large recurrent hernia. Normal heart size. No pericardial or pleural effusion. Hepatobiliary: Small punctate hepatic granulomata along the gallbladder. No other significant hepatic abnormality. No biliary dilatation. Patent hepatic and portal veins. Moderate distention of the gallbladder but no surrounding edema or inflammation. Biliary system unremarkable. Pancreas: Unremarkable. No pancreatic ductal dilatation or surrounding inflammatory changes. Spleen: Normal in size without focal abnormality. Adrenals/Urinary Tract: Normal adrenal glands. Right kidney upper pole cyst measures 4.7 cm. No renal obstruction or hydronephrosis. No hydroureter, ureteral calculus, or definite bladder abnormality. Stomach/Bowel: Negative for bowel obstruction, significant dilatation, ileus, or free air. Normal appendix demonstrated. Extensive colonic diverticulosis, most pronounced in the sigmoid and rectum. No associated acute inflammatory process, edema, free fluid, fluid collection, abscess, or perforation. Vascular/Lymphatic: Aortic atherosclerosis noted. Negative for aneurysm. Mesenteric and renal vasculature appear patent. No adenopathy. Reproductive: Remote hysterectomy. No pelvic mass or adnexal abnormality. Other: No inguinal abnormality. Small midline fat containing ventral hernia, image 25 series 2. Musculoskeletal: Diffuse osteopenia. Degenerative changes throughout the spine. No acute compression fracture. Sacral Tarlov cyst noted. IMPRESSION: No acute intra-abdominal or pelvic  finding by CT. Previous Nissen fundoplication repair of the hiatal hernia without recurrence. Abdominal atherosclerosis Extensive colonic diverticulosis without CT evidence of acute diverticulitis. No fluid collection or abscess Remote hysterectomy Normal appendix Small fat containing midline ventral hernia Electronically Signed   By: Jerilynn Mages.  Shick M.D.   On: 04/05/2017 18:02    Procedures Procedures (including critical care time)  Medications Ordered in ED Medications  iopamidol (ISOVUE-300) 61 % injection 100 mL (80 mLs Intravenous Contrast Given 04/05/17 1728)  sodium chloride 0.9 % bolus 1,000 mL (0 mLs Intravenous Stopped 04/05/17 1845)  morphine 4 MG/ML injection 4 mg (4 mg Intravenous Given 04/05/17 1805)  ciprofloxacin (CIPRO) IVPB 400 mg (0 mg Intravenous Stopped 04/05/17 1953)  metroNIDAZOLE (FLAGYL) IVPB 500 mg (0 mg Intravenous Stopped 04/05/17 2108)  ondansetron (ZOFRAN) injection 4 mg (4 mg Intravenous Given 04/05/17 1958)  promethazine (PHENERGAN) injection 12.5 mg (12.5 mg Intravenous Given 04/05/17 2119)     Initial Impression / Assessment and Plan / ED Course  I have reviewed the triage vital signs and the nursing notes.  Pertinent labs & imaging results that were available during my care of the patient were reviewed by me and considered in my medical decision making (see chart for details).     Patient with left lower quadrant abdominal pain and multiple episodes of watery nonbloody stools. Afebrile, vital signs are stable. Labwork shows a leukocytosis, which may be suggestive of an infectious process or may be related to recent steroid use. Lactate was initially slightly elevated at 2.19, but improved while in the ED. Remainder labwork consistent with diarrhea and dehydration. CT scan does show diverticulosis without CT evidence of acute diverticulitis. However with left lower quadrant pain and diarrhea, we will treat clinically for diverticulitis. Given IV Flagyl and Cipro while in  the ED. Nausea and pain have been managed while in the ED. I doubt appendicitis, pancolitis/c.diff infection, or other acute surgical abdominal pathology. Doubt ovarian torsion, TOA, PID, or ectopic pregnancy. She is unable to provide a stool sample at this time. On reevaluation, patient is feeling better, tolerating by mouth fluids and ambulatory. Repeat abdominal examination is unremarkable. She is stable for discharge home with outpatient treatment of diverticulitis as well as follow-up with primary care physician later this week. Discussed indications for  return to the ED. Patient and patient's husband verbalized understanding of and agreement with plan and patient is stable for discharge home at this time. Patient seen and evaluated by Dr. Darl Householder who agrees with assessment and plan at this time.  Final Clinical Impressions(s) / ED Diagnoses   Final diagnoses:  Diverticulitis    New Prescriptions Discharge Medication List as of 04/05/2017  9:02 PM    START taking these medications   Details  ciprofloxacin (CIPRO) 500 MG tablet Take 1 tablet (500 mg total) by mouth every 12 (twelve) hours., Starting Tue 04/05/2017, Until Fri 04/15/2017, Print    metroNIDAZOLE (FLAGYL) 500 MG tablet Take 1 tablet (500 mg total) by mouth 3 (three) times daily., Starting Tue 04/05/2017, Until Fri 04/15/2017, Print    ondansetron (ZOFRAN ODT) 4 MG disintegrating tablet Take 1 tablet (4 mg total) by mouth every 8 (eight) hours as needed for nausea or vomiting., Starting Tue 04/05/2017, Print         Nils Flack, [place] A, PA-C 04/06/17 1338    Drenda Freeze, MD 04/08/17 [phone]

## 2017-04-18 DIAGNOSIS — I252 Old myocardial infarction: Secondary | ICD-10-CM | POA: Diagnosis not present

## 2017-04-18 DIAGNOSIS — I2510 Atherosclerotic heart disease of native coronary artery without angina pectoris: Secondary | ICD-10-CM | POA: Diagnosis not present

## 2017-04-18 DIAGNOSIS — I5181 Takotsubo syndrome: Secondary | ICD-10-CM | POA: Diagnosis not present

## 2017-04-18 DIAGNOSIS — E785 Hyperlipidemia, unspecified: Secondary | ICD-10-CM | POA: Diagnosis not present

## 2017-04-18 DIAGNOSIS — I10 Essential (primary) hypertension: Secondary | ICD-10-CM | POA: Diagnosis not present

## 2017-04-20 ENCOUNTER — Encounter: Payer: Self-pay | Admitting: Internal Medicine

## 2017-04-20 ENCOUNTER — Other Ambulatory Visit: Payer: Medicare Other

## 2017-04-20 ENCOUNTER — Ambulatory Visit (INDEPENDENT_AMBULATORY_CARE_PROVIDER_SITE_OTHER): Payer: Medicare Other | Admitting: Internal Medicine

## 2017-04-20 VITALS — BP 110/62 | HR 83 | Temp 97.8°F | Ht 61.0 in | Wt 158.0 lb

## 2017-04-20 DIAGNOSIS — K219 Gastro-esophageal reflux disease without esophagitis: Secondary | ICD-10-CM | POA: Diagnosis not present

## 2017-04-20 DIAGNOSIS — R197 Diarrhea, unspecified: Secondary | ICD-10-CM

## 2017-04-20 DIAGNOSIS — I10 Essential (primary) hypertension: Secondary | ICD-10-CM | POA: Diagnosis not present

## 2017-04-20 MED ORDER — SACCHAROMYCES BOULARDII 250 MG PO CAPS: 250.0000 mg | ORAL_CAPSULE | Freq: Two times a day (BID) | ORAL | 0 refills | Status: DC

## 2017-04-20 MED ORDER — DIPHENOXYLATE-ATROPINE 2.5-0.025 MG PO TABS: 1.0000 | ORAL_TABLET | Freq: Four times a day (QID) | ORAL | 0 refills | Status: DC | PRN

## 2017-04-20 NOTE — Assessment & Plan Note (Signed)
Hold Valsartan if diarrhea

## 2017-04-20 NOTE — Assessment & Plan Note (Signed)
On Pantaprazole 

## 2017-04-20 NOTE — Patient Instructions (Signed)
Hold Valsartan if diarrhea

## 2017-04-20 NOTE — Assessment & Plan Note (Signed)
C diff test Florastor, Lomotil prn GI ref to Dr Loletha Carrow if not better

## 2017-04-20 NOTE — Progress Notes (Signed)
Subjective:  Patient ID: Cindy Simpson, female    DOB: [birth year]  Age: 71 y.o. MRN: [id number]  CC: No chief complaint on file.   HPI Cindy Simpson presents for diarrhea 2+ wks; went to ER on 9.18 and had labs/abd CT - got abx for ?diverticulitis for 10 days. Diarrhea is not better; has stools at night too.  Outpatient Medications Prior to Visit  Medication Sig Dispense Refill  . acetaminophen (TYLENOL) 500 MG tablet Take 500-1,000 mg by mouth every 6 (six) hours as needed (fore pain).     Marland Kitchen aspirin EC 81 MG EC tablet Take 1 tablet (81 mg total) by mouth daily. (Patient taking differently: Take 81 mg by mouth at bedtime. ) 30 tablet 3  . carvedilol (COREG) 6.25 MG tablet Take 6.25 mg by mouth 2 (two) times daily with a meal.     . Cholecalciferol 1000 UNITS tablet Take 1,000 Units by mouth daily after supper.     Marland Kitchen glucose blood (ACCU-CHEK AVIVA) test strip Use to check blood sugars twice a day 100 each 5  . Lancets (ACCU-CHEK SOFT TOUCH) lancets Use to check blood sugars twice a day 100 each 5  . levothyroxine (SYNTHROID, LEVOTHROID) 75 MCG tablet TAKE 1 TABLET BY MOUTH  DAILY 90 tablet 1  . lovastatin (MEVACOR) 10 MG tablet TAKE 1 TABLET BY MOUTH  DAILY 90 tablet 2  . meclizine (ANTIVERT) 12.5 MG tablet Take 1 tablet (12.5 mg total) by mouth 2 (two) times daily as needed for dizziness. 60 tablet 1  . Multiple Vitamin (MULTIVITAMIN WITH MINERALS) TABS tablet Take 1 tablet by mouth daily.    . ondansetron (ZOFRAN ODT) 4 MG disintegrating tablet Take 1 tablet (4 mg total) by mouth every 8 (eight) hours as needed for nausea or vomiting. 15 tablet 0  . pantoprazole (PROTONIX) 40 MG tablet Take 1 tablet (40 mg total) by mouth 2 (two) times daily. (Patient taking differently: Take 40 mg by mouth 2 (two) times daily before a meal. ) 180 tablet 3  . valsartan (DIOVAN) 160 MG tablet TAKE 1 TABLET BY MOUTH  DAILY 90 tablet 2  . loratadine (CLARITIN) 10 MG tablet Take 10 mg by mouth daily after  supper.     . Simethicone 125 MG CAPS Take 1-2 capsules by mouth daily as needed (for gas pains).     No facility-administered medications prior to visit.     ROS Review of Systems  Constitutional: Negative for activity change, appetite change, chills, fatigue and unexpected weight change.  HENT: Negative for congestion, mouth sores and sinus pressure.   Eyes: Negative for visual disturbance.  Respiratory: Negative for cough and chest tightness.   Gastrointestinal: Positive for abdominal pain and diarrhea. Negative for nausea.  Genitourinary: Negative for difficulty urinating, frequency and vaginal pain.  Musculoskeletal: Negative for back pain and gait problem.  Skin: Negative for pallor and rash.  Neurological: Negative for dizziness, tremors, weakness, numbness and headaches.  Psychiatric/Behavioral: Negative for confusion and sleep disturbance.    Objective:  BP 110/62 (BP Location: Left Arm, Patient Position: Sitting, Cuff Size: Normal)   Pulse 83   Temp 97.8 F (36.6 C) (Oral)   Ht 5\' 1"  (1.549 m)   Wt 158 lb (71.7 kg)   SpO2 98%   BMI 29.85 kg/m   BP Readings from Last 3 Encounters:  04/20/17 110/62  04/05/17 113/61  01/26/17 98/64    Wt Readings from Last 3 Encounters:  04/20/17 158 lb (71.7  kg)  04/05/17 156 lb (70.8 kg)  01/26/17 155 lb (70.3 kg)    Physical Exam  Constitutional: She appears well-developed. No distress.  HENT:  Head: Normocephalic.  Right Ear: External ear normal.  Left Ear: External ear normal.  Nose: Nose normal.  Mouth/Throat: Oropharynx is clear and moist.  Eyes: Pupils are equal, round, and reactive to light. Conjunctivae are normal. Right eye exhibits no discharge. Left eye exhibits no discharge.  Neck: Normal range of motion. Neck supple. No JVD present. No tracheal deviation present. No thyromegaly present.  Cardiovascular: Normal rate, regular rhythm and normal heart sounds.   Pulmonary/Chest: No stridor. No respiratory  distress. She has no wheezes.  Abdominal: Soft. Bowel sounds are normal. She exhibits no distension and no mass. There is no tenderness. There is no rebound and no guarding.  Musculoskeletal: She exhibits no edema or tenderness.  Lymphadenopathy:    She has no cervical adenopathy.  Neurological: She displays normal reflexes. No cranial nerve deficit. She exhibits normal muscle tone. Coordination normal.  Skin: No rash noted. No erythema.  Psychiatric: She has a normal mood and affect. Her behavior is normal. Judgment and thought content normal.    Lab Results  Component Value Date   WBC 29.8 (H) 04/05/2017   HGB 12.3 04/05/2017   HCT 36.8 04/05/2017   PLT 375 04/05/2017   GLUCOSE 91 04/05/2017   CHOL 166 09/14/2015   TRIG 93 09/14/2015   HDL 45 09/14/2015   LDLDIRECT 137.5 03/17/2009   LDLCALC 102 (H) 09/14/2015   ALT 16 04/05/2017   AST 21 04/05/2017   NA 135 04/05/2017   K 4.2 04/05/2017   CL 99 (L) 04/05/2017   CREATININE 1.26 (H) 04/05/2017   BUN 25 (H) 04/05/2017   CO2 24 04/05/2017   TSH 2.54 08/31/2016   INR 0.99 03/03/2016   HGBA1C 6.1 10/12/2016    Ct Abdomen Pelvis W Contrast  Result Date: 04/05/2017 CLINICAL DATA:  Abdominal pain, constipation and diarrhea. EXAM: CT ABDOMEN AND PELVIS WITH CONTRAST TECHNIQUE: Multidetector CT imaging of the abdomen and pelvis was performed using the standard protocol following bolus administration of intravenous contrast. CONTRAST:  80mL ISOVUE-300 IOPAMIDOL (ISOVUE-300) INJECTION 61% COMPARISON:  None. FINDINGS: Lower chest: Minor right base atelectasis versus scarring. No significant lower lobe pneumonia or acute process. Nissen fundoplication repair noted. No significant large recurrent hernia. Normal heart size. No pericardial or pleural effusion. Hepatobiliary: Small punctate hepatic granulomata along the gallbladder. No other significant hepatic abnormality. No biliary dilatation. Patent hepatic and portal veins. Moderate  distention of the gallbladder but no surrounding edema or inflammation. Biliary system unremarkable. Pancreas: Unremarkable. No pancreatic ductal dilatation or surrounding inflammatory changes. Spleen: Normal in size without focal abnormality. Adrenals/Urinary Tract: Normal adrenal glands. Right kidney upper pole cyst measures 4.7 cm. No renal obstruction or hydronephrosis. No hydroureter, ureteral calculus, or definite bladder abnormality. Stomach/Bowel: Negative for bowel obstruction, significant dilatation, ileus, or free air. Normal appendix demonstrated. Extensive colonic diverticulosis, most pronounced in the sigmoid and rectum. No associated acute inflammatory process, edema, free fluid, fluid collection, abscess, or perforation. Vascular/Lymphatic: Aortic atherosclerosis noted. Negative for aneurysm. Mesenteric and renal vasculature appear patent. No adenopathy. Reproductive: Remote hysterectomy. No pelvic mass or adnexal abnormality. Other: No inguinal abnormality. Small midline fat containing ventral hernia, image 25 series 2. Musculoskeletal: Diffuse osteopenia. Degenerative changes throughout the spine. No acute compression fracture. Sacral Tarlov cyst noted. IMPRESSION: No acute intra-abdominal or pelvic finding by CT. Previous Nissen fundoplication repair of the hiatal  hernia without recurrence. Abdominal atherosclerosis Extensive colonic diverticulosis without CT evidence of acute diverticulitis. No fluid collection or abscess Remote hysterectomy Normal appendix Small fat containing midline ventral hernia Electronically Signed   By: Judie Petit.  Shick M.D.   On: 04/05/2017 18:02    Assessment & Plan:   There are no diagnoses linked to this encounter. I have discontinued Ms. Aceituno's loratadine and Simethicone. I am also having her maintain her Cholecalciferol, multivitamin with minerals, aspirin, pantoprazole, acetaminophen, meclizine, carvedilol, glucose blood, accu-chek soft touch, lovastatin,  valsartan, levothyroxine, ondansetron, TURMERIC PO, and Cetirizine HCl (ZYRTEC PO).  Meds ordered this encounter  Medications  . TURMERIC PO    Sig: Take by mouth.  . Cetirizine HCl (ZYRTEC PO)    Sig: Take by mouth.     Follow-up: No Follow-up on file.  [name], MD

## 2017-04-21 LAB — CLOSTRIDIUM DIFFICILE BY PCR: Toxigenic C. Difficile by PCR: NOT DETECTED

## 2017-05-18 ENCOUNTER — Encounter: Payer: Self-pay | Admitting: Internal Medicine

## 2017-05-18 ENCOUNTER — Ambulatory Visit (INDEPENDENT_AMBULATORY_CARE_PROVIDER_SITE_OTHER): Payer: Medicare Other | Admitting: Internal Medicine

## 2017-05-18 DIAGNOSIS — R739 Hyperglycemia, unspecified: Secondary | ICD-10-CM

## 2017-05-18 DIAGNOSIS — E785 Hyperlipidemia, unspecified: Secondary | ICD-10-CM | POA: Diagnosis not present

## 2017-05-18 DIAGNOSIS — I10 Essential (primary) hypertension: Secondary | ICD-10-CM | POA: Diagnosis not present

## 2017-05-18 DIAGNOSIS — R197 Diarrhea, unspecified: Secondary | ICD-10-CM | POA: Diagnosis not present

## 2017-05-18 DIAGNOSIS — E034 Atrophy of thyroid (acquired): Secondary | ICD-10-CM

## 2017-05-18 NOTE — Assessment & Plan Note (Signed)
Labs

## 2017-05-18 NOTE — Assessment & Plan Note (Signed)
On Metoprolol, Diovan

## 2017-05-18 NOTE — Assessment & Plan Note (Signed)
Resolving GI ref if re-occured

## 2017-05-18 NOTE — Progress Notes (Signed)
Subjective:  Patient ID: Cindy Simpson, female    DOB: 18-Feb-1946  Age: 71 y.o. MRN: [id number]  CC: No chief complaint on file.   HPI Cindy Simpson presents for diarrhea f/u - almost gone HTN, hypothyroidism, GERD f/u  Outpatient Medications Prior to Visit  Medication Sig Dispense Refill  . acetaminophen (TYLENOL) 500 MG tablet Take 500-1,000 mg by mouth every 6 (six) hours as needed (fore pain).     Marland Kitchen aspirin EC 81 MG EC tablet Take 1 tablet (81 mg total) by mouth daily. (Patient taking differently: Take 81 mg by mouth at bedtime. ) 30 tablet 3  . carvedilol (COREG) 6.25 MG tablet Take 6.25 mg by mouth 2 (two) times daily with a meal.     . Cetirizine HCl (ZYRTEC PO) Take by mouth.    . Cholecalciferol 1000 UNITS tablet Take 1,000 Units by mouth daily after supper.     . diphenoxylate-atropine (LOMOTIL) 2.5-0.025 MG tablet Take 1 tablet by mouth 4 (four) times daily as needed for diarrhea or loose stools. 60 tablet 0  . glucose blood (ACCU-CHEK AVIVA) test strip Use to check blood sugars twice a day 100 each 5  . Lancets (ACCU-CHEK SOFT TOUCH) lancets Use to check blood sugars twice a day 100 each 5  . levothyroxine (SYNTHROID, LEVOTHROID) 75 MCG tablet TAKE 1 TABLET BY MOUTH  DAILY 90 tablet 1  . lovastatin (MEVACOR) 10 MG tablet TAKE 1 TABLET BY MOUTH  DAILY 90 tablet 2  . meclizine (ANTIVERT) 12.5 MG tablet Take 1 tablet (12.5 mg total) by mouth 2 (two) times daily as needed for dizziness. 60 tablet 1  . Multiple Vitamin (MULTIVITAMIN WITH MINERALS) TABS tablet Take 1 tablet by mouth daily.    . ondansetron (ZOFRAN ODT) 4 MG disintegrating tablet Take 1 tablet (4 mg total) by mouth every 8 (eight) hours as needed for nausea or vomiting. 15 tablet 0  . pantoprazole (PROTONIX) 40 MG tablet Take 1 tablet (40 mg total) by mouth 2 (two) times daily. (Patient taking differently: Take 40 mg by mouth 2 (two) times daily before a meal. ) 180 tablet 3  . saccharomyces boulardii  (FLORASTOR) 250 MG capsule Take 1 capsule (250 mg total) by mouth 2 (two) times daily. 60 capsule 0  . TURMERIC PO Take by mouth.    . valsartan (DIOVAN) 160 MG tablet TAKE 1 TABLET BY MOUTH  DAILY 90 tablet 2   No facility-administered medications prior to visit.     ROS Review of Systems  Constitutional: Negative for activity change, appetite change, chills, fatigue and unexpected weight change.  HENT: Negative for congestion, mouth sores and sinus pressure.   Eyes: Negative for visual disturbance.  Respiratory: Negative for cough and chest tightness.   Gastrointestinal: Negative for abdominal pain and nausea.  Genitourinary: Negative for difficulty urinating, frequency and vaginal pain.  Musculoskeletal: Positive for arthralgias. Negative for back pain and gait problem.  Skin: Negative for pallor and rash.  Neurological: Negative for dizziness, tremors, weakness, numbness and headaches.  Psychiatric/Behavioral: Negative for confusion and sleep disturbance.    Objective:  BP 114/70 (BP Location: Left Arm, Patient Position: Sitting, Cuff Size: Large)   Pulse 78   Temp (!) 97.5 F (36.4 C) (Oral)   Ht 5\' 1"  (1.549 m)   Wt 160 lb (72.6 kg)   SpO2 96%   BMI 30.23 kg/m   BP Readings from Last 3 Encounters:  05/18/17 114/70  04/20/17 110/62  04/05/17 113/61  Wt Readings from Last 3 Encounters:  05/18/17 160 lb (72.6 kg)  04/20/17 158 lb (71.7 kg)  04/05/17 156 lb (70.8 kg)    Physical Exam  Constitutional: She appears well-developed. No distress.  HENT:  Head: Normocephalic.  Right Ear: External ear normal.  Left Ear: External ear normal.  Nose: Nose normal.  Mouth/Throat: Oropharynx is clear and moist.  Eyes: Pupils are equal, round, and reactive to light. Conjunctivae are normal. Right eye exhibits no discharge. Left eye exhibits no discharge.  Neck: Normal range of motion. Neck supple. No JVD present. No tracheal deviation present. No thyromegaly present.    Cardiovascular: Normal rate, regular rhythm and normal heart sounds.   Pulmonary/Chest: No stridor. No respiratory distress. She has no wheezes.  Abdominal: Soft. Bowel sounds are normal. She exhibits no distension and no mass. There is no tenderness. There is no rebound and no guarding.  Musculoskeletal: She exhibits no edema or tenderness.  Lymphadenopathy:    She has no cervical adenopathy.  Neurological: She displays normal reflexes. No cranial nerve deficit. She exhibits normal muscle tone. Coordination normal.  Skin: No rash noted. No erythema.  Psychiatric: She has a normal mood and affect. Her behavior is normal. Judgment and thought content normal.    Lab Results  Component Value Date   WBC 29.8 (H) 04/05/2017   HGB 12.3 04/05/2017   HCT 36.8 04/05/2017   PLT 375 04/05/2017   GLUCOSE 91 04/05/2017   CHOL 166 09/14/2015   TRIG 93 09/14/2015   HDL 45 09/14/2015   LDLDIRECT 137.5 03/17/2009   LDLCALC 102 (H) 09/14/2015   ALT 16 04/05/2017   AST 21 04/05/2017   NA 135 04/05/2017   K 4.2 04/05/2017   CL 99 (L) 04/05/2017   CREATININE 1.26 (H) 04/05/2017   BUN 25 (H) 04/05/2017   CO2 24 04/05/2017   TSH 2.54 08/31/2016   INR 0.99 03/03/2016   HGBA1C 6.1 10/12/2016    Ct Abdomen Pelvis W Contrast  Result Date: 04/05/2017 CLINICAL DATA:  Abdominal pain, constipation and diarrhea. EXAM: CT ABDOMEN AND PELVIS WITH CONTRAST TECHNIQUE: Multidetector CT imaging of the abdomen and pelvis was performed using the standard protocol following bolus administration of intravenous contrast. CONTRAST:  80mL ISOVUE-300 IOPAMIDOL (ISOVUE-300) INJECTION 61% COMPARISON:  None. FINDINGS: Lower chest: Minor right base atelectasis versus scarring. No significant lower lobe pneumonia or acute process. Nissen fundoplication repair noted. No significant large recurrent hernia. Normal heart size. No pericardial or pleural effusion. Hepatobiliary: Small punctate hepatic granulomata along the  gallbladder. No other significant hepatic abnormality. No biliary dilatation. Patent hepatic and portal veins. Moderate distention of the gallbladder but no surrounding edema or inflammation. Biliary system unremarkable. Pancreas: Unremarkable. No pancreatic ductal dilatation or surrounding inflammatory changes. Spleen: Normal in size without focal abnormality. Adrenals/Urinary Tract: Normal adrenal glands. Right kidney upper pole cyst measures 4.7 cm. No renal obstruction or hydronephrosis. No hydroureter, ureteral calculus, or definite bladder abnormality. Stomach/Bowel: Negative for bowel obstruction, significant dilatation, ileus, or free air. Normal appendix demonstrated. Extensive colonic diverticulosis, most pronounced in the sigmoid and rectum. No associated acute inflammatory process, edema, free fluid, fluid collection, abscess, or perforation. Vascular/Lymphatic: Aortic atherosclerosis noted. Negative for aneurysm. Mesenteric and renal vasculature appear patent. No adenopathy. Reproductive: Remote hysterectomy. No pelvic mass or adnexal abnormality. Other: No inguinal abnormality. Small midline fat containing ventral hernia, image 25 series 2. Musculoskeletal: Diffuse osteopenia. Degenerative changes throughout the spine. No acute compression fracture. Sacral Tarlov cyst noted. IMPRESSION: No acute intra-abdominal  or pelvic finding by CT. Previous Nissen fundoplication repair of the hiatal hernia without recurrence. Abdominal atherosclerosis Extensive colonic diverticulosis without CT evidence of acute diverticulitis. No fluid collection or abscess Remote hysterectomy Normal appendix Small fat containing midline ventral hernia Electronically Signed   By: Judie Petit.  Shick M.D.   On: 04/05/2017 18:02    Assessment & Plan:   There are no diagnoses linked to this encounter. I am having Ms. Barkan maintain her Cholecalciferol, multivitamin with minerals, aspirin, pantoprazole, acetaminophen, meclizine,  carvedilol, glucose blood, accu-chek soft touch, lovastatin, valsartan, levothyroxine, ondansetron, TURMERIC PO, Cetirizine HCl (ZYRTEC PO), diphenoxylate-atropine, and saccharomyces boulardii.  No orders of the defined types were placed in this encounter.    Follow-up: No Follow-up on file.  [name], MD

## 2017-05-18 NOTE — Assessment & Plan Note (Signed)
Levothroid 

## 2017-06-20 DIAGNOSIS — I252 Old myocardial infarction: Secondary | ICD-10-CM | POA: Diagnosis not present

## 2017-06-20 DIAGNOSIS — E785 Hyperlipidemia, unspecified: Secondary | ICD-10-CM | POA: Diagnosis not present

## 2017-06-20 DIAGNOSIS — I10 Essential (primary) hypertension: Secondary | ICD-10-CM | POA: Diagnosis not present

## 2017-06-20 DIAGNOSIS — I5181 Takotsubo syndrome: Secondary | ICD-10-CM | POA: Diagnosis not present

## 2017-06-20 DIAGNOSIS — I2510 Atherosclerotic heart disease of native coronary artery without angina pectoris: Secondary | ICD-10-CM | POA: Diagnosis not present

## 2017-06-27 ENCOUNTER — Encounter: Payer: Self-pay | Admitting: Internal Medicine

## 2017-08-29 ENCOUNTER — Other Ambulatory Visit: Payer: Self-pay | Admitting: Internal Medicine

## 2017-08-29 DIAGNOSIS — I10 Essential (primary) hypertension: Secondary | ICD-10-CM

## 2017-09-14 ENCOUNTER — Encounter: Payer: Self-pay | Admitting: Internal Medicine

## 2017-09-14 ENCOUNTER — Ambulatory Visit (INDEPENDENT_AMBULATORY_CARE_PROVIDER_SITE_OTHER): Payer: Medicare Other | Admitting: Internal Medicine

## 2017-09-14 DIAGNOSIS — K219 Gastro-esophageal reflux disease without esophagitis: Secondary | ICD-10-CM

## 2017-09-14 DIAGNOSIS — K5904 Chronic idiopathic constipation: Secondary | ICD-10-CM | POA: Diagnosis not present

## 2017-09-14 DIAGNOSIS — E034 Atrophy of thyroid (acquired): Secondary | ICD-10-CM | POA: Diagnosis not present

## 2017-09-14 IMAGING — RF DG ESOPHAGUS
14 of 23 series · 14 of 23 positions shown · non-contrast
Comparison: 11/12/2015

CLINICAL DATA: Esophageal dysphagia and postprandial chest pain.

EXAM:
ESOPHOGRAM / BARIUM SWALLOW / BARIUM TABLET STUDY
TECHNIQUE: Combined double contrast and single contrast examination performed
using effervescent [HOSPITAL]s, thick barium liquid, and thin barium
liquid. The patient was observed with fluoroscopy swallowing a 13 mm
barium sulphate tablet.
FLUOROSCOPY TIME:  Fluoroscopy Time:  1 minutes 37 seconds
Radiation Exposure Index (if provided by the fluoroscopic device):
10.2 mGy
Number of Acquired Spot Images: 0

[Series 1: run · 1 of 1 slices shown (1 of 14)]
[im 1/1]
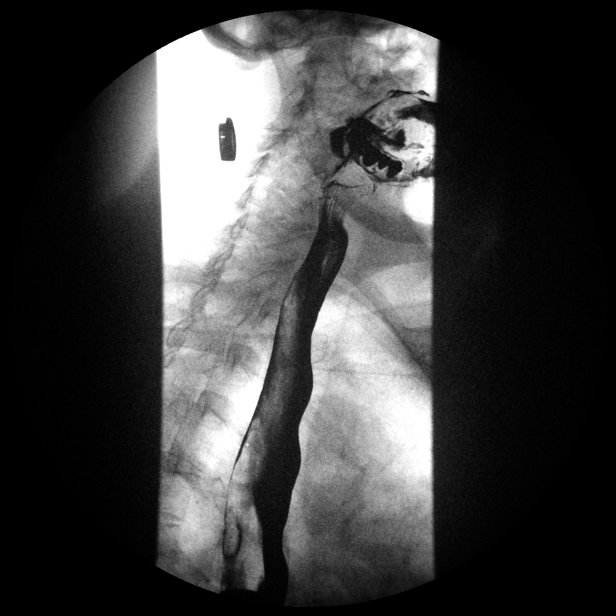

[Series 3: run · 1 of 1 slices shown (2 of 14)]
[im 1/1]
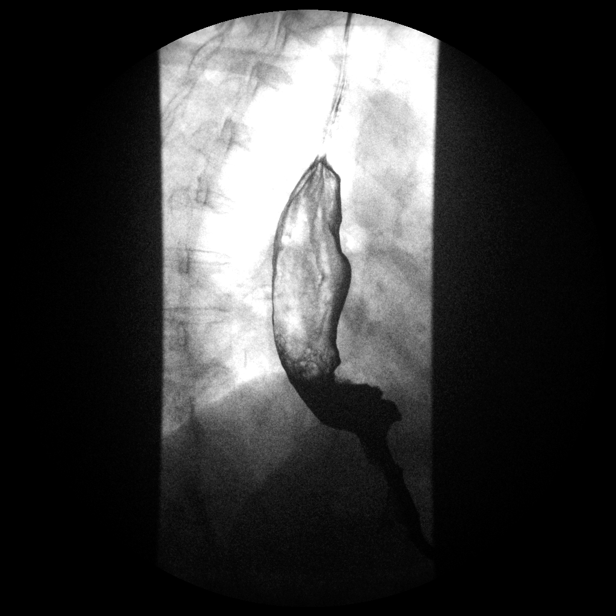

[Series 5: run · 1 of 1 slices shown (3 of 14)]
[im 1/1]
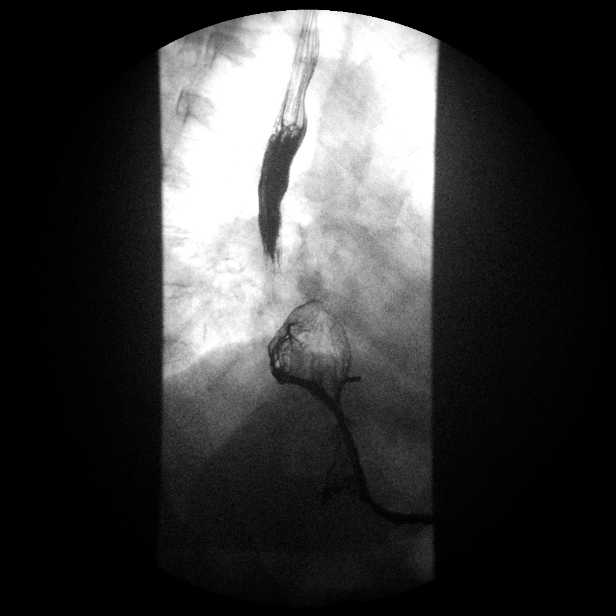

[Series 6: run · 1 of 1 slices shown (4 of 14)]
[im 1/1]
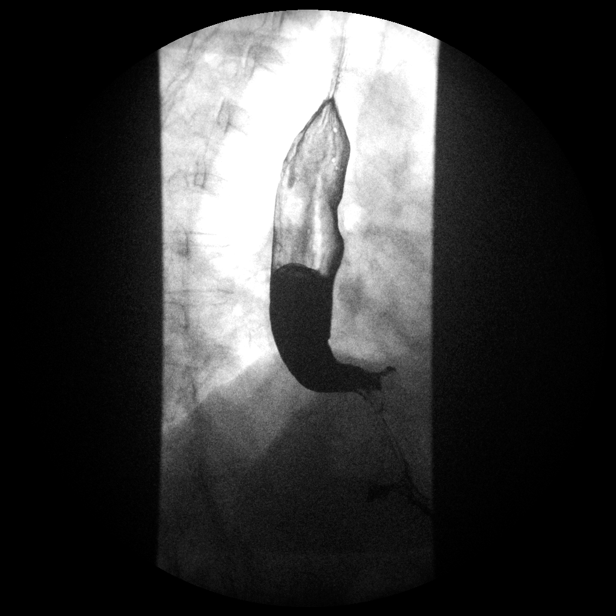

[Series 8: run · 1 of 1 slices shown (5 of 14)]
[im 1/1]
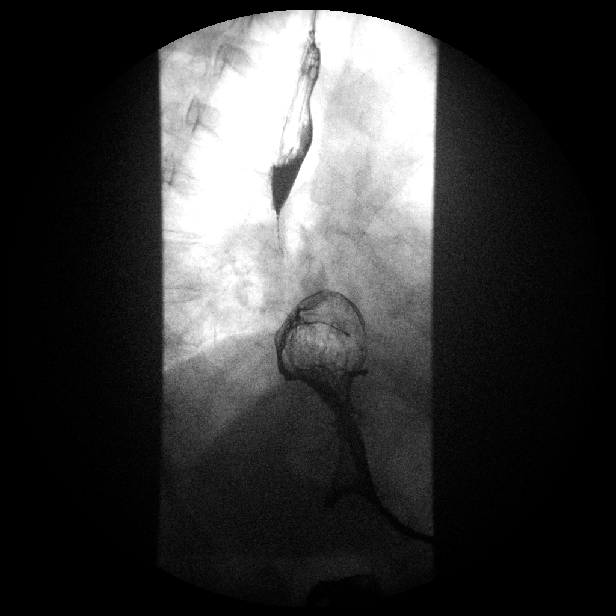

[Series 10: run · 1 of 1 slices shown (6 of 14)]
[im 1/1]
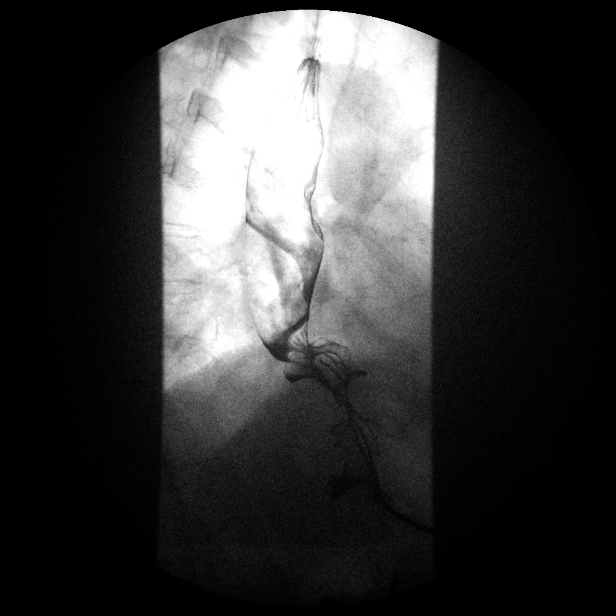

[Series 11: run · 1 of 1 slices shown (7 of 14)]
[im 1/1]
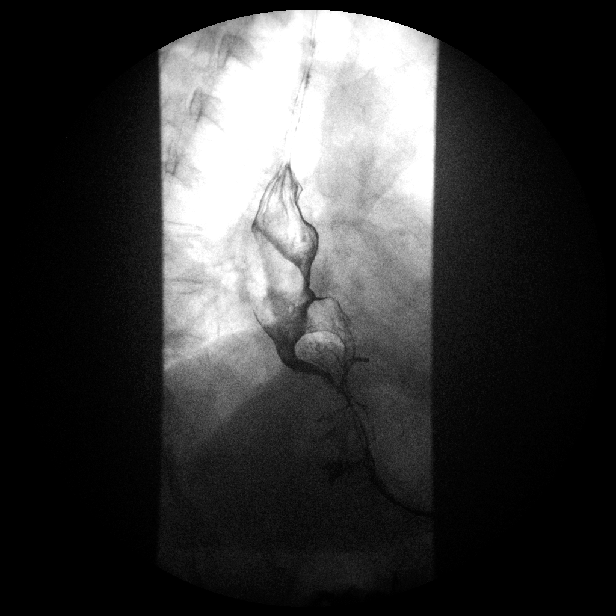

[Series 13: run · 1 of 1 slices shown (8 of 14)]
[im 1/1]
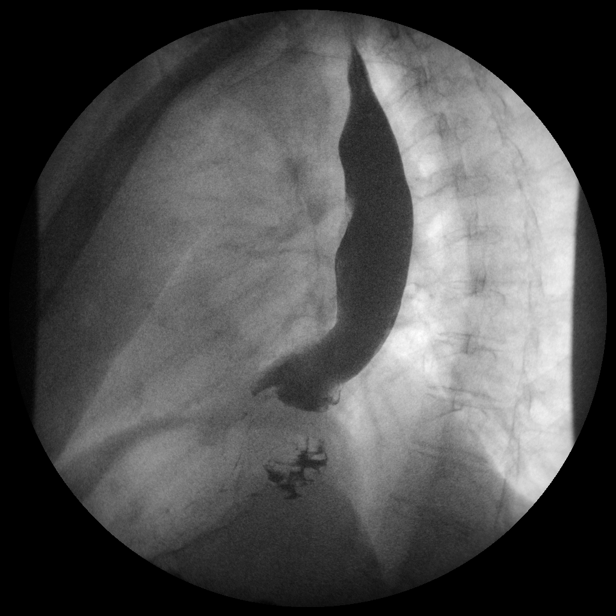

[Series 14: run · 1 of 1 slices shown (9 of 14)]
[im 1/1]
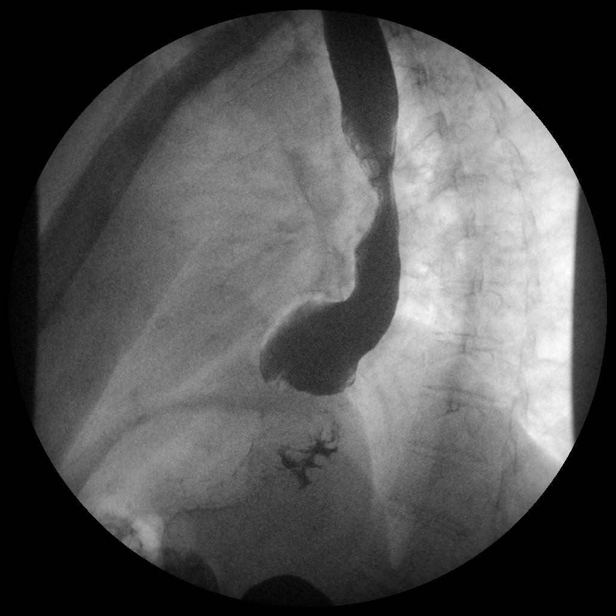

[Series 16: run · 1 of 1 slices shown (10 of 14)]
[im 1/1]
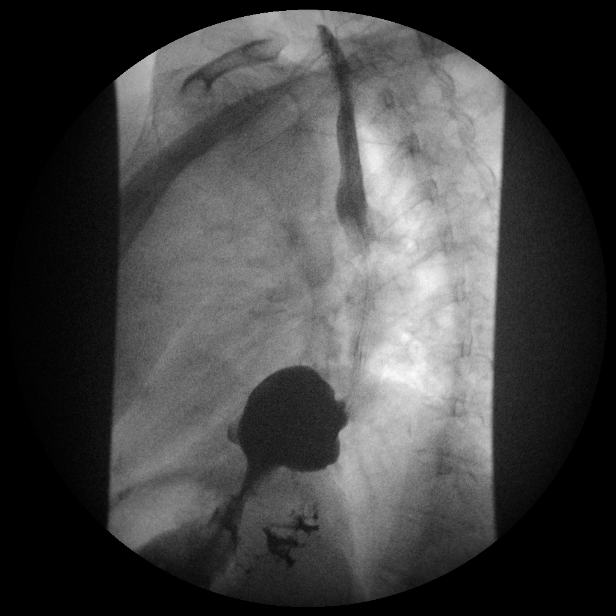

[Series 18: run · 1 of 1 slices shown (11 of 14)]
[im 1/1]
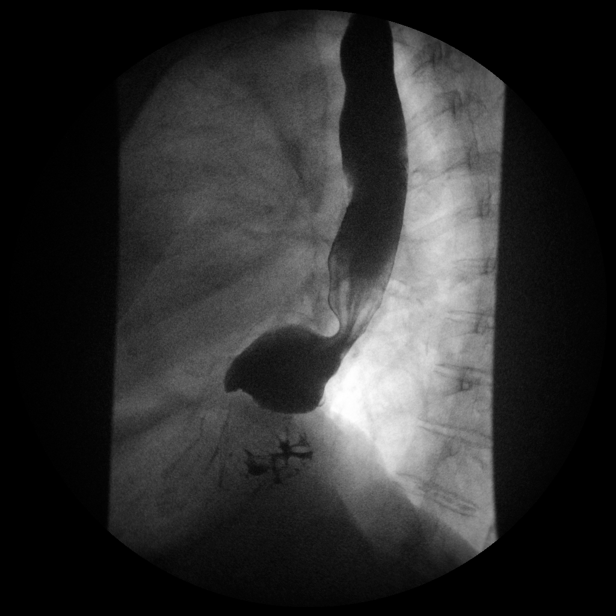

[Series 19: run · 1 of 1 slices shown (12 of 14)]
[im 1/1]
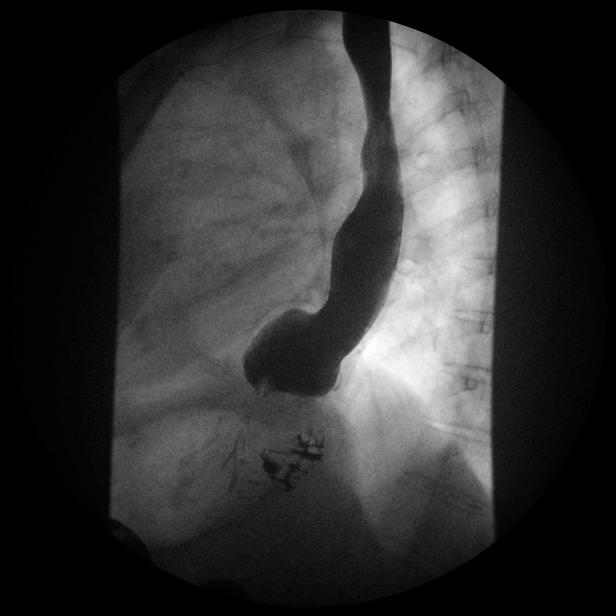

[Series 21: run · 1 of 1 slices shown (13 of 14)]
[im 1/1]
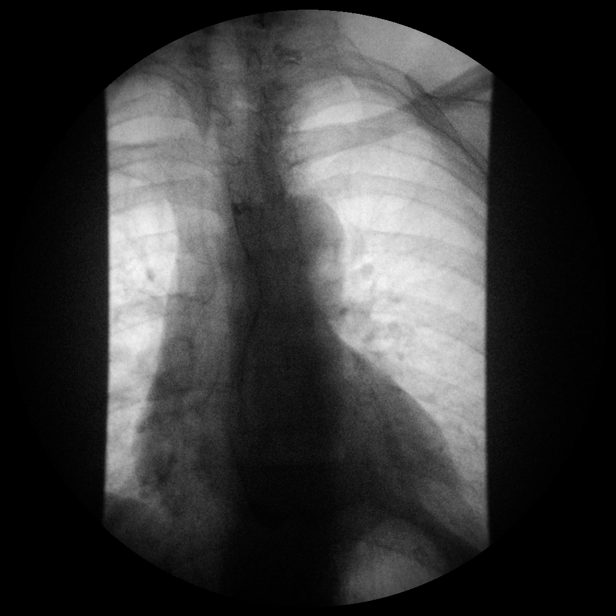

[Series 23: run · 1 of 1 slices shown (14 of 14)]
[im 1/1]
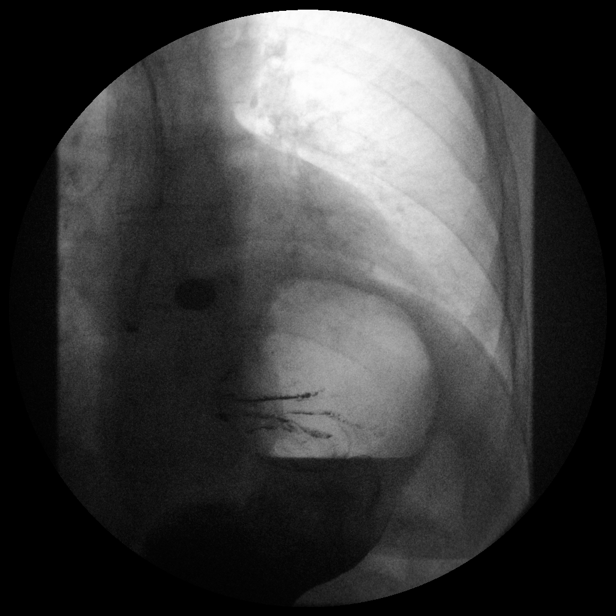

[14 of 23 positions shown; findings below may reference images not displayed]

FINDINGS: Oblique pharyngeal imaging was normal. No obstruction or aspiration
noted.

Interval repair of hiatal hernia with Nissen fundoplication. The
wrap appears normally located and intact. Gas [HOSPITAL] distended the
esophagus above the wrap and reproduced the patient's symptoms.

No mucosal lesion is noted.

Nonspecific dysmotility with weak distal primary propulsion wave and
full column stasis when recumbent. No gastroesophageal reflux noted.

A 13 mm barium tablet was not seen to pass through the Blain wrap.
IMPRESSION: 1. Expected appearance after Nissen fundoplication - intact wrap and
no recurrent hernia.
2. Nonspecific dysmotility with full column stasis above the wrap
when recumbent.
3. Esophageal distention from gas [HOSPITAL]s reproduced patient's
symptoms. Suspect patient's postprandial pain is related to
esophageal distention from delayed passage through the wrap.

## 2017-09-14 NOTE — Assessment & Plan Note (Addendum)
Try Senakot S 1-2 tablets as needed for constipation Miralax 1-2 scoops as needed Dr Loletha Carrow

## 2017-09-14 NOTE — Assessment & Plan Note (Signed)
Levothroid 

## 2017-09-14 NOTE — Assessment & Plan Note (Signed)
Protonix.  ?

## 2017-09-14 NOTE — Progress Notes (Signed)
Subjective:  Patient ID: Cindy Simpson, female    DOB: 07/13/1946  Age: 72 y.o. MRN: [id number]  CC: No chief complaint on file.   HPI Cindy Simpson presents for HTN, GERD, dyslipidemia C/o constipation off and on  - x3d now...  Outpatient Medications Prior to Visit  Medication Sig Dispense Refill  . acetaminophen (TYLENOL) 500 MG tablet Take 500-1,000 mg by mouth every 6 (six) hours as needed (fore pain).     Marland Kitchen aspirin EC 81 MG EC tablet Take 1 tablet (81 mg total) by mouth daily. (Patient taking differently: Take 81 mg by mouth at bedtime. ) 30 tablet 3  . carvedilol (COREG) 6.25 MG tablet Take 6.25 mg by mouth 2 (two) times daily with a meal.     . Cetirizine HCl (ZYRTEC PO) Take by mouth.    . Cholecalciferol 1000 UNITS tablet Take 1,000 Units by mouth daily after supper.     . diphenoxylate-atropine (LOMOTIL) 2.5-0.025 MG tablet Take 1 tablet by mouth 4 (four) times daily as needed for diarrhea or loose stools. 60 tablet 0  . glucose blood (ACCU-CHEK AVIVA) test strip Use to check blood sugars twice a day 100 each 5  . Lancets (ACCU-CHEK SOFT TOUCH) lancets Use to check blood sugars twice a day 100 each 5  . levothyroxine (SYNTHROID, LEVOTHROID) 75 MCG tablet TAKE 1 TABLET BY MOUTH  DAILY 90 tablet 1  . lovastatin (MEVACOR) 10 MG tablet TAKE 1 TABLET BY MOUTH  DAILY 90 tablet 2  . Multiple Vitamin (MULTIVITAMIN WITH MINERALS) TABS tablet Take 1 tablet by mouth daily.    . ondansetron (ZOFRAN ODT) 4 MG disintegrating tablet Take 1 tablet (4 mg total) by mouth every 8 (eight) hours as needed for nausea or vomiting. 15 tablet 0  . pantoprazole (PROTONIX) 40 MG tablet TAKE 1 TABLET BY MOUTH TWO  TIMES DAILY 180 tablet 3  . saccharomyces boulardii (FLORASTOR) 250 MG capsule Take 1 capsule (250 mg total) by mouth 2 (two) times daily. 60 capsule 0  . TURMERIC PO Take by mouth.    . valsartan (DIOVAN) 160 MG tablet TAKE 1 TABLET BY MOUTH  DAILY 90 tablet 2   No facility-administered  medications prior to visit.     ROS Review of Systems  Constitutional: Negative for activity change, appetite change, chills, fatigue and unexpected weight change.  HENT: Negative for congestion, mouth sores and sinus pressure.   Eyes: Negative for visual disturbance.  Respiratory: Negative for cough and chest tightness.   Gastrointestinal: Positive for constipation. Negative for abdominal pain and nausea.  Genitourinary: Negative for difficulty urinating, frequency and vaginal pain.  Musculoskeletal: Positive for arthralgias. Negative for back pain and gait problem.  Skin: Negative for pallor and rash.  Neurological: Negative for dizziness, tremors, weakness, numbness and headaches.  Psychiatric/Behavioral: Negative for confusion and sleep disturbance.    Objective:  BP 116/82 (BP Location: Left Arm, Patient Position: Sitting, Cuff Size: Large)   Pulse 82   Temp 97.9 F (36.6 C) (Oral)   Ht 5\' 1"  (1.549 m)   Wt 163 lb (73.9 kg)   SpO2 98%   BMI 30.80 kg/m   BP Readings from Last 3 Encounters:  09/14/17 116/82  05/18/17 114/70  04/20/17 110/62    Wt Readings from Last 3 Encounters:  09/14/17 163 lb (73.9 kg)  05/18/17 160 lb (72.6 kg)  04/20/17 158 lb (71.7 kg)    Physical Exam  Constitutional: She appears well-developed. No distress.  HENT:  Head: Normocephalic.  Right Ear: External ear normal.  Left Ear: External ear normal.  Nose: Nose normal.  Mouth/Throat: Oropharynx is clear and moist.  Eyes: Conjunctivae are normal. Pupils are equal, round, and reactive to light. Right eye exhibits no discharge. Left eye exhibits no discharge.  Neck: Normal range of motion. Neck supple. No JVD present. No tracheal deviation present. No thyromegaly present.  Cardiovascular: Normal rate, regular rhythm and normal heart sounds.  Pulmonary/Chest: No stridor. No respiratory distress. She has no wheezes.  Abdominal: Soft. Bowel sounds are normal. She exhibits no distension and no  mass. There is no tenderness. There is no rebound and no guarding.  Musculoskeletal: She exhibits no edema or tenderness.  Lymphadenopathy:    She has no cervical adenopathy.  Neurological: She displays normal reflexes. No cranial nerve deficit. She exhibits normal muscle tone. Coordination normal.  Skin: No rash noted. No erythema.  Psychiatric: She has a normal mood and affect. Her behavior is normal. Judgment and thought content normal.  LS tender  Lab Results  Component Value Date   WBC 29.8 (H) 04/05/2017   HGB 12.3 04/05/2017   HCT 36.8 04/05/2017   PLT 375 04/05/2017   GLUCOSE 91 04/05/2017   CHOL 166 09/14/2015   TRIG 93 09/14/2015   HDL 45 09/14/2015   LDLDIRECT 137.5 03/17/2009   LDLCALC 102 (H) 09/14/2015   ALT 16 04/05/2017   AST 21 04/05/2017   NA 135 04/05/2017   K 4.2 04/05/2017   CL 99 (L) 04/05/2017   CREATININE 1.26 (H) 04/05/2017   BUN 25 (H) 04/05/2017   CO2 24 04/05/2017   TSH 2.54 08/31/2016   INR 0.99 03/03/2016   HGBA1C 6.1 10/12/2016    Ct Abdomen Pelvis W Contrast  Result Date: 04/05/2017 CLINICAL DATA:  Abdominal pain, constipation and diarrhea. EXAM: CT ABDOMEN AND PELVIS WITH CONTRAST TECHNIQUE: Multidetector CT imaging of the abdomen and pelvis was performed using the standard protocol following bolus administration of intravenous contrast. CONTRAST:  80mL ISOVUE-300 IOPAMIDOL (ISOVUE-300) INJECTION 61% COMPARISON:  None. FINDINGS: Lower chest: Minor right base atelectasis versus scarring. No significant lower lobe pneumonia or acute process. Nissen fundoplication repair noted. No significant large recurrent hernia. Normal heart size. No pericardial or pleural effusion. Hepatobiliary: Small punctate hepatic granulomata along the gallbladder. No other significant hepatic abnormality. No biliary dilatation. Patent hepatic and portal veins. Moderate distention of the gallbladder but no surrounding edema or inflammation. Biliary system unremarkable.  Pancreas: Unremarkable. No pancreatic ductal dilatation or surrounding inflammatory changes. Spleen: Normal in size without focal abnormality. Adrenals/Urinary Tract: Normal adrenal glands. Right kidney upper pole cyst measures 4.7 cm. No renal obstruction or hydronephrosis. No hydroureter, ureteral calculus, or definite bladder abnormality. Stomach/Bowel: Negative for bowel obstruction, significant dilatation, ileus, or free air. Normal appendix demonstrated. Extensive colonic diverticulosis, most pronounced in the sigmoid and rectum. No associated acute inflammatory process, edema, free fluid, fluid collection, abscess, or perforation. Vascular/Lymphatic: Aortic atherosclerosis noted. Negative for aneurysm. Mesenteric and renal vasculature appear patent. No adenopathy. Reproductive: Remote hysterectomy. No pelvic mass or adnexal abnormality. Other: No inguinal abnormality. Small midline fat containing ventral hernia, image 25 series 2. Musculoskeletal: Diffuse osteopenia. Degenerative changes throughout the spine. No acute compression fracture. Sacral Tarlov cyst noted. IMPRESSION: No acute intra-abdominal or pelvic finding by CT. Previous Nissen fundoplication repair of the hiatal hernia without recurrence. Abdominal atherosclerosis Extensive colonic diverticulosis without CT evidence of acute diverticulitis. No fluid collection or abscess Remote hysterectomy Normal appendix Small fat containing midline ventral  hernia Electronically Signed   By: Judie Petit.  Shick M.D.   On: 04/05/2017 18:02    Assessment & Plan:   There are no diagnoses linked to this encounter. I am having Patriciann L. Champlain maintain her Cholecalciferol, multivitamin with minerals, aspirin, acetaminophen, carvedilol, glucose blood, accu-chek soft touch, ondansetron, TURMERIC PO, Cetirizine HCl (ZYRTEC PO), diphenoxylate-atropine, saccharomyces boulardii, levothyroxine, valsartan, lovastatin, and pantoprazole.  No orders of the defined types were  placed in this encounter.    Follow-up: No Follow-up on file.  [name], MD

## 2017-09-14 NOTE — Patient Instructions (Signed)
Try Senakot S 1-2 tablets as needed for constipation Miralax 1-2 scoops as needed

## 2017-09-15 ENCOUNTER — Other Ambulatory Visit (INDEPENDENT_AMBULATORY_CARE_PROVIDER_SITE_OTHER): Payer: Medicare Other

## 2017-09-15 DIAGNOSIS — E034 Atrophy of thyroid (acquired): Secondary | ICD-10-CM | POA: Diagnosis not present

## 2017-09-15 LAB — BASIC METABOLIC PANEL
BUN: 22 mg/dL (ref 6–23)
CALCIUM: 9.5 mg/dL (ref 8.4–10.5)
CO2: 28 meq/L (ref 19–32)
Chloride: 104 mEq/L (ref 96–112)
Creatinine, Ser: 0.87 mg/dL (ref 0.40–1.20)
GFR: 67.99 mL/min (ref >60.00)
GLUCOSE: 89 mg/dL (ref 70–99)
Potassium: 4.1 mEq/L (ref 3.5–5.1)
SODIUM: 140 meq/L (ref 135–145)

## 2017-09-19 ENCOUNTER — Encounter: Payer: Self-pay | Admitting: Internal Medicine

## 2017-09-19 DIAGNOSIS — I5181 Takotsubo syndrome: Secondary | ICD-10-CM | POA: Diagnosis not present

## 2017-09-19 DIAGNOSIS — I2510 Atherosclerotic heart disease of native coronary artery without angina pectoris: Secondary | ICD-10-CM | POA: Diagnosis not present

## 2017-09-19 DIAGNOSIS — R0609 Other forms of dyspnea: Secondary | ICD-10-CM | POA: Diagnosis not present

## 2017-09-19 DIAGNOSIS — I10 Essential (primary) hypertension: Secondary | ICD-10-CM | POA: Diagnosis not present

## 2017-09-19 DIAGNOSIS — I252 Old myocardial infarction: Secondary | ICD-10-CM | POA: Diagnosis not present

## 2017-10-12 DIAGNOSIS — I5181 Takotsubo syndrome: Secondary | ICD-10-CM | POA: Diagnosis not present

## 2017-10-12 DIAGNOSIS — I2510 Atherosclerotic heart disease of native coronary artery without angina pectoris: Secondary | ICD-10-CM | POA: Diagnosis not present

## 2017-10-12 DIAGNOSIS — R0609 Other forms of dyspnea: Secondary | ICD-10-CM | POA: Diagnosis not present

## 2017-10-12 DIAGNOSIS — I252 Old myocardial infarction: Secondary | ICD-10-CM | POA: Diagnosis not present

## 2017-10-12 DIAGNOSIS — I10 Essential (primary) hypertension: Secondary | ICD-10-CM | POA: Diagnosis not present

## 2017-10-19 ENCOUNTER — Ambulatory Visit: Payer: Medicare Other | Admitting: Gastroenterology

## 2017-10-19 ENCOUNTER — Encounter: Payer: Self-pay | Admitting: Gastroenterology

## 2017-10-19 VITALS — BP 112/74 | HR 80 | Ht 61.0 in | Wt 160.1 lb

## 2017-10-19 DIAGNOSIS — K5900 Constipation, unspecified: Secondary | ICD-10-CM | POA: Diagnosis not present

## 2017-10-19 DIAGNOSIS — R1030 Lower abdominal pain, unspecified: Secondary | ICD-10-CM

## 2017-10-19 DIAGNOSIS — K625 Hemorrhage of anus and rectum: Secondary | ICD-10-CM | POA: Diagnosis not present

## 2017-10-19 NOTE — Progress Notes (Signed)
Cindy Simpson Progress Note  Chief Complaint: Chronic constipation.  Subjective  History:  Last seen Feb 2018 for GERD and dysphagia.  She had previously undergone repair of a large hiatal hernia with significant improvement in all symptoms.  She was still having some intermittent chest pain she described as a pulling sensation.  It brought her to the to the emergency department a couple of times, was determined not to be cardiac in nature.  Cindy Simpson was concerned that it may be recurrence of her hernia, though that seems doubtful to me. Barium swallow 09/06/2016 shows the wrap is intact, there was primary esophageal dysmotility with stasis of barium: When patient was recumbent, as well as delayed passage of 13 mm tablet. Her upper digestive symptoms have been stable since then.  Cindy Simpson is a limited historian, and I had some difficulty characterizing her symptoms. She describes over 10 years of alternating bowel habits, though it sounds like primarily constipation.  She may go up to a week without a bowel movement, then takes perhaps a teaspoon of MiraLAX and then has a large amount of loose stool.  She has occasional bleeding, as recently as just a few days ago.  Her primary care reportedly gave her Linzess 72 mcg, but she says just the dose or 2 caused diarrhea and she could not take it.  Her last colonoscopy in 2002 with Dr. Sharlett Iles revealed diverticulosis and no colon polyps. Cindy Simpson describes generalized bandlike lower abdominal pain that does not have any clear relation to position, time of day, eating or bowel movements.  ROS: Cardiovascular:  no chest pain Respiratory: no dyspnea Arthralgias Remainder of systems negative except as above  The patient's Past Medical, Family and Social History were reviewed and are on file in the EMR.  Objective:  Med list reviewed  Current Outpatient Medications:  .  acetaminophen (TYLENOL) 500 MG tablet, Take 500-1,000 mg by mouth every 6  (six) hours as needed (fore pain). , Disp: , Rfl:  .  aspirin EC 81 MG EC tablet, Take 1 tablet (81 mg total) by mouth daily. (Patient taking differently: Take 81 mg by mouth at bedtime. ), Disp: 30 tablet, Rfl: 3 .  carvedilol (COREG) 6.25 MG tablet, Take 6.25 mg by mouth 2 (two) times daily with a meal. , Disp: , Rfl:  .  Cetirizine HCl (ZYRTEC PO), Take by mouth., Disp: , Rfl:  .  Cholecalciferol 1000 UNITS tablet, Take 1,000 Units by mouth daily after supper. , Disp: , Rfl:  .  glucose blood (ACCU-CHEK AVIVA) test strip, Use to check blood sugars twice a day, Disp: 100 each, Rfl: 5 .  Lancets (ACCU-CHEK SOFT TOUCH) lancets, Use to check blood sugars twice a day, Disp: 100 each, Rfl: 5 .  levothyroxine (SYNTHROID, LEVOTHROID) 75 MCG tablet, TAKE 1 TABLET BY MOUTH  DAILY, Disp: 90 tablet, Rfl: 1 .  lovastatin (MEVACOR) 10 MG tablet, TAKE 1 TABLET BY MOUTH  DAILY, Disp: 90 tablet, Rfl: 2 .  Multiple Vitamin (MULTIVITAMIN WITH MINERALS) TABS tablet, Take 1 tablet by mouth daily., Disp: , Rfl:  .  ondansetron (ZOFRAN ODT) 4 MG disintegrating tablet, Take 1 tablet (4 mg total) by mouth every 8 (eight) hours as needed for nausea or vomiting., Disp: 15 tablet, Rfl: 0 .  pantoprazole (PROTONIX) 40 MG tablet, TAKE 1 TABLET BY MOUTH TWO  TIMES DAILY, Disp: 180 tablet, Rfl: 3 .  saccharomyces boulardii (FLORASTOR) 250 MG capsule, Take 1 capsule (250 mg total) by mouth  2 (two) times daily., Disp: 60 capsule, Rfl: 0 .  TURMERIC PO, Take by mouth., Disp: , Rfl:  .  valsartan (DIOVAN) 160 MG tablet, TAKE 1 TABLET BY MOUTH  DAILY, Disp: 90 tablet, Rfl: 2   Vital signs in last 24 hrs: Vitals:   10/19/17 1048  BP: 112/74  Pulse: 80    Physical Exam  Well-appearing woman, antalgic gait, gets on exam table without assistance.  HEENT: sclera anicteric, oral mucosa moist without lesions  Neck: supple, no thyromegaly, JVD or lymphadenopathy  Cardiac: RRR without murmurs, S1S2 heard, no peripheral  edema  Pulm: clear to auscultation bilaterally, normal RR and effort noted  Abdomen: soft, no tenderness, with active bowel sounds. No guarding or palpable hepatosplenomegaly.  Skin; warm and dry, no jaundice or rash  Recent Labs:  Negative cologuard test 02/2015  Radiologic studies: None recent to review  @ASSESSMENTPLANBEGIN @ Assessment: Encounter Diagnoses  Name Primary?  . Lower abdominal pain Yes  . Constipation, unspecified constipation type   . Rectal bleeding     Long-standing altered bowel habits, primarily chronic constipation.  It sounds like she is sensitive to even low-dose treatment that causes loose stool. She is having rectal bleeding as well, it may be benign just from constipation, but she should have a colonoscopy to rule out neoplasia and any obstructing cause of the symptoms.   Plan: I advised her to start a tablespoon a day of Benefiber to hopefully improve regularity. Colonoscopy.  She is agreeable after discussion of procedure and risks.  The benefits and risks of the planned procedure were described in detail with the patient or (when appropriate) their health care proxy.  Risks were outlined as including, but not limited to, bleeding, infection, perforation, adverse medication reaction leading to cardiac or pulmonary decompensation, or pancreatitis (if ERCP).  The limitation of incomplete mucosal visualization was also discussed.  No guarantees or warranties were given.    Total time 30 minutes, over half spent in counseling and coordination of care.   [name]

## 2017-10-19 NOTE — Patient Instructions (Addendum)
If you are age 73 or older, your body mass index should be between 23-30. Your Body mass index is 30.26 kg/m. If this is out of the aforementioned range listed, please consider follow up with your Primary Care Provider.  If you are age 4 or younger, your body mass index should be between 19-25. Your Body mass index is 30.26 kg/m. If this is out of the aformentioned range listed, please consider follow up with your Primary Care Provider.   You have been scheduled for a colonoscopy. Please follow written instructions given to you at your visit today.  Please pick up your prep supplies at the pharmacy within the next 1-3 days. If you use inhalers (even only as needed), please bring them with you on the day of your procedure. Your physician has requested that you go to www.startemmi.com and enter the access code given to you at your visit today. This web site gives a general overview about your procedure. However, you should still follow specific instructions given to you by our office regarding your preparation for the procedure.  Please start Benefiber 1 tablespoon daily.   Thank you for choosing [facility]  Dr Wilfrid Lund III

## 2017-11-01 ENCOUNTER — Encounter: Payer: Self-pay | Admitting: Gastroenterology

## 2017-11-15 ENCOUNTER — Other Ambulatory Visit: Payer: Self-pay

## 2017-11-15 ENCOUNTER — Encounter: Payer: Self-pay | Admitting: Gastroenterology

## 2017-11-15 ENCOUNTER — Ambulatory Visit (AMBULATORY_SURGERY_CENTER): Payer: Medicare Other | Admitting: Gastroenterology

## 2017-11-15 VITALS — BP 109/69 | HR 68 | Temp 97.7°F | Resp 11 | Ht 61.0 in | Wt 160.0 lb

## 2017-11-15 DIAGNOSIS — K5900 Constipation, unspecified: Secondary | ICD-10-CM

## 2017-11-15 DIAGNOSIS — D120 Benign neoplasm of cecum: Secondary | ICD-10-CM | POA: Diagnosis not present

## 2017-11-15 DIAGNOSIS — K625 Hemorrhage of anus and rectum: Secondary | ICD-10-CM

## 2017-11-15 MED ORDER — SODIUM CHLORIDE 0.9 % IV SOLN: 500.0000 mL | Freq: Once | INTRAVENOUS | Status: DC

## 2017-11-15 NOTE — Progress Notes (Signed)
A/ox3 pleased with MAC, report to RN 

## 2017-11-15 NOTE — Progress Notes (Signed)
Called to room to assist during endoscopic procedure.  Patient ID and intended procedure confirmed with present staff. Received instructions for my participation in the procedure from the performing physician.  

## 2017-11-15 NOTE — Patient Instructions (Signed)
Discharge instructions given. Handouts on polyps and diverticulosis. No aspirin,ibuprofen, or other non-steroidal anti-inflammatory drugs for 7 days. Resume previous medications. YOU HAD AN ENDOSCOPIC PROCEDURE TODAY AT [facility]:   Refer to the procedure report that was given to you for any specific questions about what was found during the examination.  If the procedure report does not answer your questions, please call your gastroenterologist to clarify.  If you requested that your care partner not be given the details of your procedure findings, then the procedure report has been included in a sealed envelope for you to review at your convenience later.  YOU SHOULD EXPECT: Some feelings of bloating in the abdomen. Passage of more gas than usual.  Walking can help get rid of the air that was put into your GI tract during the procedure and reduce the bloating. If you had a lower endoscopy (such as a colonoscopy or flexible sigmoidoscopy) you may notice spotting of blood in your stool or on the toilet paper. If you underwent a bowel prep for your procedure, you may not have a normal bowel movement for a few days.  Please Note:  You might notice some irritation and congestion in your nose or some drainage.  This is from the oxygen used during your procedure.  There is no need for concern and it should clear up in a day or so.  SYMPTOMS TO REPORT IMMEDIATELY:   Following lower endoscopy (colonoscopy or flexible sigmoidoscopy):  Excessive amounts of blood in the stool  Significant tenderness or worsening of abdominal pains  Swelling of the abdomen that is new, acute  Fever of 100F or higher   For urgent or emergent issues, a gastroenterologist can be reached at any hour by calling [phone].   DIET:  We do recommend a small meal at first, but then you may proceed to your regular diet.  Drink plenty of fluids but you should avoid alcoholic beverages for 24  hours.  ACTIVITY:  You should plan to take it easy for the rest of today and you should NOT DRIVE or use heavy machinery until tomorrow (because of the sedation medicines used during the test).    FOLLOW UP: Our staff will call the number listed on your records the next business day following your procedure to check on you and address any questions or concerns that you may have regarding the information given to you following your procedure. If we do not reach you, we will leave a message.  However, if you are feeling well and you are not experiencing any problems, there is no need to return our call.  We will assume that you have returned to your regular daily activities without incident.  If any biopsies were taken you will be contacted by phone or by letter within the next 1-3 weeks.  Please call us at [phone] if you have not heard about the biopsies in 3 weeks.    SIGNATURES/CONFIDENTIALITY: You and/or your care partner have signed paperwork which will be entered into your electronic medical record.  These signatures attest to the fact that that the information above on your After Visit Summary has been reviewed and is understood.  Full responsibility of the confidentiality of this discharge information lies with you and/or your care-partner.

## 2017-11-15 NOTE — Progress Notes (Signed)
Nulty CRNA relieves [employer]

## 2017-11-15 NOTE — Op Note (Signed)
[facility] Patient Name: Cindy Simpson Procedure Date: 11/15/2017 4:42 PM MRN: [id number] Endoscopist: Sherilyn Cooter L. Myrtie Neither , MD Age: 72 Referring MD:  Date of Birth: 1946/05/09 Gender: Female Account #: [id number] Procedure:                Colonoscopy Indications:              Lower abdominal pain, Rectal bleeding, Constipation Medicines:                Monitored Anesthesia Care Procedure:                Pre-Anesthesia Assessment:                           - Prior to the procedure, a History and Physical                            was performed, and patient medications and                            allergies were reviewed. The patient's tolerance of                            previous anesthesia was also reviewed. The risks                            and benefits of the procedure and the sedation                            options and risks were discussed with the patient.                            All questions were answered, and informed consent                            was obtained. Anticoagulants: The patient has taken                            aspirin. It was decided not to withhold this                            medication prior to the procedure. ASA Grade                            Assessment: III - A patient with severe systemic                            disease. After reviewing the risks and benefits,                            the patient was deemed in satisfactory condition to                            undergo the procedure.  After obtaining informed consent, the colonoscope                            was passed under direct vision. Throughout the                            procedure, the patient's blood pressure, pulse, and                            oxygen saturations were monitored continuously. The                            Model PCF-[id number]) scope was introduced                            through the anus and advanced  to the the cecum,                            identified by appendiceal orifice and ileocecal                            valve. The colonoscopy was performed with moderate                            difficulty due to multiple diverticula in the                            colon, poor bowel prep, a redundant colon and a                            tortuous colon. Successful completion of the                            procedure was aided by changing the patient to a                            supine position and using manual pressure. The                            patient tolerated the procedure well. The quality                            of the bowel preparation was fair. The ileocecal                            valve, appendiceal orifice, and rectum were                            photographed. The quality of the bowel preparation                            was evaluated using the BBPS [facility] Bowel  Preparation Scale) with scores of: Right Colon = 1,                            Transverse Colon = 2 and Left Colon = 1. The total                            BBPS score equals 4. Scope In: 4:44:32 PM Scope Out: 5:14:37 PM Scope Withdrawal Time: 0 hours 15 minutes 52 seconds  Total Procedure Duration: 0 hours 30 minutes 5 seconds  Findings:                 The perianal and digital rectal examinations were                            normal.                           A 12 mm polyp was found in the cecum. The polyp was                            sessile. The polyp was removed with a piecemeal                            technique using a cold snare. Resection and                            retrieval were complete. To prevent bleeding                            post-intervention, three hemostatic clips were                            successfully placed (MR conditional). There was no                            bleeding at the end of the procedure.                            Many large-mouthed diverticula were found in the                            entire colon, especially in the left colon, making                            scope passage very challenging.                           Retroflexion in the rectum was not performed due to                            anatomy. Complications:            No immediate complications. Estimated Blood Loss:     Estimated blood loss was minimal. Impression:               -  Preparation of the colon was fair.                           - One 12 mm polyp in the cecum, removed piecemeal                            using a cold snare. Resected and retrieved. Clips                            (MR conditional) were placed.                           - Diverticulosis in the entire examined colon.                           Benign anal bleeding from constipation. The patient                            reports being very sensitive to laxatives (see                            clinic notes). Recommendation:           - Patient has a contact number available for                            emergencies. The signs and symptoms of potential                            delayed complications were discussed with the                            patient. Return to normal activities tomorrow.                            Written discharge instructions were provided to the                            patient.                           - Resume previous diet.                           - No aspirin, ibuprofen, naproxen, or other                            non-steroidal anti-inflammatory drugs for 7 days                            after polyp removal. Conrtinue other medications.                           - Await pathology results.                           -  High fiber diet                           - Repeat colonoscopy is recommended for                            surveillance. The colonoscopy date will be                            determined after  pathology results from today's                            exam become available for review. Chanita Boden L. Myrtie Neither, MD 11/15/2017 5:21:31 PM This report has been signed electronically.

## 2017-11-15 NOTE — Progress Notes (Signed)
Pt's states no medical or surgical changes since previsit or office visit. 

## 2017-11-16 ENCOUNTER — Telehealth: Payer: Self-pay

## 2017-11-16 NOTE — Telephone Encounter (Signed)
Left message

## 2017-11-23 ENCOUNTER — Encounter: Payer: Self-pay | Admitting: Gastroenterology

## 2017-12-19 DIAGNOSIS — I5181 Takotsubo syndrome: Secondary | ICD-10-CM | POA: Diagnosis not present

## 2017-12-19 DIAGNOSIS — E785 Hyperlipidemia, unspecified: Secondary | ICD-10-CM | POA: Diagnosis not present

## 2017-12-19 DIAGNOSIS — K219 Gastro-esophageal reflux disease without esophagitis: Secondary | ICD-10-CM | POA: Diagnosis not present

## 2017-12-19 DIAGNOSIS — I252 Old myocardial infarction: Secondary | ICD-10-CM | POA: Diagnosis not present

## 2017-12-19 DIAGNOSIS — E039 Hypothyroidism, unspecified: Secondary | ICD-10-CM | POA: Diagnosis not present

## 2017-12-19 DIAGNOSIS — I2510 Atherosclerotic heart disease of native coronary artery without angina pectoris: Secondary | ICD-10-CM | POA: Diagnosis not present

## 2017-12-19 DIAGNOSIS — I10 Essential (primary) hypertension: Secondary | ICD-10-CM | POA: Diagnosis not present

## 2018-01-02 ENCOUNTER — Encounter: Payer: Self-pay | Admitting: Internal Medicine

## 2018-01-11 ENCOUNTER — Ambulatory Visit (INDEPENDENT_AMBULATORY_CARE_PROVIDER_SITE_OTHER): Payer: Medicare Other | Admitting: Internal Medicine

## 2018-01-11 ENCOUNTER — Encounter: Payer: Self-pay | Admitting: Internal Medicine

## 2018-01-11 DIAGNOSIS — K219 Gastro-esophageal reflux disease without esophagitis: Secondary | ICD-10-CM

## 2018-01-11 DIAGNOSIS — I10 Essential (primary) hypertension: Secondary | ICD-10-CM | POA: Diagnosis not present

## 2018-01-11 DIAGNOSIS — J4520 Mild intermittent asthma, uncomplicated: Secondary | ICD-10-CM | POA: Diagnosis not present

## 2018-01-11 DIAGNOSIS — E034 Atrophy of thyroid (acquired): Secondary | ICD-10-CM

## 2018-01-11 MED ORDER — VALSARTAN 80 MG PO TABS: 80.0000 mg | ORAL_TABLET | Freq: Every day | ORAL | 3 refills | Status: DC

## 2018-01-11 NOTE — Assessment & Plan Note (Signed)
Levothroid 

## 2018-01-11 NOTE — Progress Notes (Signed)
Subjective:  Patient ID: Cindy Simpson, female    DOB: 1946-06-13  Age: 72 y.o. MRN: [id number]  CC: No chief complaint on file.   HPI Cindy Simpson presents for HTN, GERD f/u C/o dry skin  Outpatient Medications Prior to Visit  Medication Sig Dispense Refill  . acetaminophen (TYLENOL) 500 MG tablet Take 500-1,000 mg by mouth every 6 (six) hours as needed (fore pain).     Marland Kitchen aspirin EC 81 MG EC tablet Take 1 tablet (81 mg total) by mouth daily. (Patient taking differently: Take 81 mg by mouth at bedtime. ) 30 tablet 3  . carvedilol (COREG) 6.25 MG tablet Take 6.25 mg by mouth 2 (two) times daily with a meal.     . Cetirizine HCl (ZYRTEC PO) Take by mouth.    . Cholecalciferol 1000 UNITS tablet Take 1,000 Units by mouth daily after supper.     Marland Kitchen glucose blood (ACCU-CHEK AVIVA) test strip Use to check blood sugars twice a day 100 each 5  . Lancets (ACCU-CHEK SOFT TOUCH) lancets Use to check blood sugars twice a day 100 each 5  . levothyroxine (SYNTHROID, LEVOTHROID) 75 MCG tablet TAKE 1 TABLET BY MOUTH  DAILY 90 tablet 1  . lovastatin (MEVACOR) 10 MG tablet TAKE 1 TABLET BY MOUTH  DAILY 90 tablet 2  . Multiple Vitamin (MULTIVITAMIN WITH MINERALS) TABS tablet Take 1 tablet by mouth daily.    . ondansetron (ZOFRAN ODT) 4 MG disintegrating tablet Take 1 tablet (4 mg total) by mouth every 8 (eight) hours as needed for nausea or vomiting. 15 tablet 0  . pantoprazole (PROTONIX) 40 MG tablet TAKE 1 TABLET BY MOUTH TWO  TIMES DAILY 180 tablet 3  . saccharomyces boulardii (FLORASTOR) 250 MG capsule Take 1 capsule (250 mg total) by mouth 2 (two) times daily. 60 capsule 0  . TURMERIC PO Take by mouth.    . valsartan (DIOVAN) 160 MG tablet TAKE 1 TABLET BY MOUTH  DAILY 90 tablet 2   Facility-Administered Medications Prior to Visit  Medication Dose Route Frequency Provider Last Rate Last Dose  . 0.9 %  sodium chloride infusion  500 mL Intravenous Once Charlie Pitter III, MD         ROS: Review of Systems  Constitutional: Negative for activity change, appetite change, chills, fatigue and unexpected weight change.  HENT: Negative for congestion, mouth sores and sinus pressure.   Eyes: Negative for visual disturbance.  Respiratory: Negative for cough and chest tightness.   Gastrointestinal: Negative for abdominal pain and nausea.  Genitourinary: Negative for difficulty urinating, frequency and vaginal pain.  Musculoskeletal: Negative for back pain and gait problem.  Skin: Negative for pallor and rash.  Neurological: Negative for dizziness, tremors, weakness, numbness and headaches.  Psychiatric/Behavioral: Negative for confusion, sleep disturbance and suicidal ideas.    Objective:  BP 114/76 (BP Location: Left Arm, Patient Position: Sitting, Cuff Size: Normal)   Pulse 66   Temp 98.2 F (36.8 C) (Oral)   Ht 5\' 1"  (1.549 m)   Wt 158 lb (71.7 kg)   SpO2 98%   BMI 29.85 kg/m   BP Readings from Last 3 Encounters:  01/11/18 114/76  11/15/17 109/69  10/19/17 112/74    Wt Readings from Last 3 Encounters:  01/11/18 158 lb (71.7 kg)  11/15/17 160 lb (72.6 kg)  10/19/17 160 lb 2 oz (72.6 kg)    Physical Exam  Constitutional: She appears well-developed. No distress.  HENT:  Head: Normocephalic.  Right Ear: External ear normal.  Left Ear: External ear normal.  Nose: Nose normal.  Mouth/Throat: Oropharynx is clear and moist.  Eyes: Pupils are equal, round, and reactive to light. Conjunctivae are normal. Right eye exhibits no discharge. Left eye exhibits no discharge.  Neck: Normal range of motion. Neck supple. No JVD present. No tracheal deviation present. No thyromegaly present.  Cardiovascular: Normal rate, regular rhythm and normal heart sounds.  Pulmonary/Chest: No stridor. No respiratory distress. She has no wheezes.  Abdominal: Soft. Bowel sounds are normal. She exhibits no distension and no mass. There is no tenderness. There is no rebound and no  guarding.  Musculoskeletal: She exhibits no edema or tenderness.  Lymphadenopathy:    She has no cervical adenopathy.  Neurological: She displays normal reflexes. No cranial nerve deficit. She exhibits normal muscle tone. Coordination normal.  Skin: No rash noted. No erythema.  Psychiatric: She has a normal mood and affect. Her behavior is normal. Judgment and thought content normal.  lightheaded when sat up  Lab Results  Component Value Date   WBC 29.8 (H) 04/05/2017   HGB 12.3 04/05/2017   HCT 36.8 04/05/2017   PLT 375 04/05/2017   GLUCOSE 89 09/15/2017   CHOL 166 09/14/2015   TRIG 93 09/14/2015   HDL 45 09/14/2015   LDLDIRECT 137.5 03/17/2009   LDLCALC 102 (H) 09/14/2015   ALT 16 04/05/2017   AST 21 04/05/2017   NA 140 09/15/2017   K 4.1 09/15/2017   CL 104 09/15/2017   CREATININE 0.87 09/15/2017   BUN 22 09/15/2017   CO2 28 09/15/2017   TSH 2.54 08/31/2016   INR 0.99 03/03/2016   HGBA1C 6.1 10/12/2016    Ct Abdomen Pelvis W Contrast  Result Date: 04/05/2017 CLINICAL DATA:  Abdominal pain, constipation and diarrhea. EXAM: CT ABDOMEN AND PELVIS WITH CONTRAST TECHNIQUE: Multidetector CT imaging of the abdomen and pelvis was performed using the standard protocol following bolus administration of intravenous contrast. CONTRAST:  80mL ISOVUE-300 IOPAMIDOL (ISOVUE-300) INJECTION 61% COMPARISON:  None. FINDINGS: Lower chest: Minor right base atelectasis versus scarring. No significant lower lobe pneumonia or acute process. Nissen fundoplication repair noted. No significant large recurrent hernia. Normal heart size. No pericardial or pleural effusion. Hepatobiliary: Small punctate hepatic granulomata along the gallbladder. No other significant hepatic abnormality. No biliary dilatation. Patent hepatic and portal veins. Moderate distention of the gallbladder but no surrounding edema or inflammation. Biliary system unremarkable. Pancreas: Unremarkable. No pancreatic ductal dilatation or  surrounding inflammatory changes. Spleen: Normal in size without focal abnormality. Adrenals/Urinary Tract: Normal adrenal glands. Right kidney upper pole cyst measures 4.7 cm. No renal obstruction or hydronephrosis. No hydroureter, ureteral calculus, or definite bladder abnormality. Stomach/Bowel: Negative for bowel obstruction, significant dilatation, ileus, or free air. Normal appendix demonstrated. Extensive colonic diverticulosis, most pronounced in the sigmoid and rectum. No associated acute inflammatory process, edema, free fluid, fluid collection, abscess, or perforation. Vascular/Lymphatic: Aortic atherosclerosis noted. Negative for aneurysm. Mesenteric and renal vasculature appear patent. No adenopathy. Reproductive: Remote hysterectomy. No pelvic mass or adnexal abnormality. Other: No inguinal abnormality. Small midline fat containing ventral hernia, image 25 series 2. Musculoskeletal: Diffuse osteopenia. Degenerative changes throughout the spine. No acute compression fracture. Sacral Tarlov cyst noted. IMPRESSION: No acute intra-abdominal or pelvic finding by CT. Previous Nissen fundoplication repair of the hiatal hernia without recurrence. Abdominal atherosclerosis Extensive colonic diverticulosis without CT evidence of acute diverticulitis. No fluid collection or abscess Remote hysterectomy Normal appendix Small fat containing midline ventral hernia Electronically Signed  By: Osvaldo Shipper M.D.   On: 04/05/2017 18:02    Assessment & Plan:   There are no diagnoses linked to this encounter.   No orders of the defined types were placed in this encounter.    Follow-up: No follow-ups on file.  [name], MD

## 2018-01-11 NOTE — Assessment & Plan Note (Signed)
Protonix.  ?

## 2018-01-11 NOTE — Assessment & Plan Note (Addendum)
Coreg Reduce Diovan to 80 mg/d due to low BP

## 2018-01-11 NOTE — Assessment & Plan Note (Signed)
Treat HH

## 2018-01-11 NOTE — Patient Instructions (Signed)
Use Sebamed shower soap and lotion - less drying

## 2018-03-01 ENCOUNTER — Ambulatory Visit: Payer: Self-pay | Admitting: *Deleted

## 2018-03-01 NOTE — Telephone Encounter (Signed)
Call transferred from nurse line of insurance company stating that pt had concerns of BP being less than 100. Spoke with pt who states that about an hour ago BP was 99/72 and kept going down. Pt does not report other readings.Pt states she thought BP might have been low due to the chair she was sitting in so she switched chairs, sat upright and made sure her feet was on the ground and not crossed and rechecked her BP. Rechecked BP was 111/73 and states that it was a few minutes before speaking with triage nurse. Pt reports that pulse has remained in the high 70s.Pt states that she did take her Carvedilol this morning and will take another dose tonight along with Valsartan.  Pt denies any dizziness, chest pain or difficulty breathing at this time. Pt states that she has been feeling tired yesterday since working in the yard for 3 hours and also felt tired today while folding clothes. Pt has been sitting down in the chair for the past hour and states she feels ok currently.Pt reports that she is also scheduled for an appt with her cardiologist on 03/19/18. Pt offered to make appt but pt states she has an appt scheduled in October and will call office back if she has worsening of symptoms.   Answer Assessment - Initial Assessment Questions 1. BLOOD PRESSURE: "What is the blood pressure?" "Did you take at least two measurements 5 minutes apart?"     11/73 2. ONSET: "When did you take your blood pressure?"     Couple of minutes before speaking with triage nurse 3. HOW: "How did you obtain the blood pressure?" (e.g., visiting nurse, automatic home BP monitor)     Automatic home monitor 4. HISTORY: "Do you have a history of low blood pressure?" "What is your blood pressure normally?"     Pt reports that during her last office visits her BP was note to be less than 100 5. MEDICATIONS: "Are you taking any medications for blood pressure?" If yes: "Have they been changed recently?"     Pt is taking Carvedilol and  Valsartan. Pt does not voice any changes in medications 6. PULSE RATE: "Do you know what your pulse rate is?"      High 70s 7. OTHER SYMPTOMS: "Have you been sick recently?" "Have you had a recent injury?"     Not sickness or recent injuries voiced by the pt  Protocols used: LOW BLOOD PRESSURE-A-AH

## 2018-03-03 DIAGNOSIS — Z1231 Encounter for screening mammogram for malignant neoplasm of breast: Secondary | ICD-10-CM | POA: Diagnosis not present

## 2018-03-03 LAB — HM MAMMOGRAPHY

## 2018-03-09 ENCOUNTER — Other Ambulatory Visit: Payer: Self-pay | Admitting: Internal Medicine

## 2018-03-10 ENCOUNTER — Encounter: Payer: Self-pay | Admitting: Internal Medicine

## 2018-03-27 DIAGNOSIS — E785 Hyperlipidemia, unspecified: Secondary | ICD-10-CM | POA: Diagnosis not present

## 2018-03-27 DIAGNOSIS — I10 Essential (primary) hypertension: Secondary | ICD-10-CM | POA: Diagnosis not present

## 2018-03-27 DIAGNOSIS — I2510 Atherosclerotic heart disease of native coronary artery without angina pectoris: Secondary | ICD-10-CM | POA: Diagnosis not present

## 2018-03-27 DIAGNOSIS — I5181 Takotsubo syndrome: Secondary | ICD-10-CM | POA: Diagnosis not present

## 2018-03-27 DIAGNOSIS — I252 Old myocardial infarction: Secondary | ICD-10-CM | POA: Diagnosis not present

## 2018-04-18 ENCOUNTER — Ambulatory Visit (INDEPENDENT_AMBULATORY_CARE_PROVIDER_SITE_OTHER): Payer: Medicare Other | Admitting: Internal Medicine

## 2018-04-18 ENCOUNTER — Other Ambulatory Visit (INDEPENDENT_AMBULATORY_CARE_PROVIDER_SITE_OTHER): Payer: Medicare Other

## 2018-04-18 ENCOUNTER — Encounter: Payer: Self-pay | Admitting: Internal Medicine

## 2018-04-18 DIAGNOSIS — I10 Essential (primary) hypertension: Secondary | ICD-10-CM | POA: Diagnosis not present

## 2018-04-18 DIAGNOSIS — H918X3 Other specified hearing loss, bilateral: Secondary | ICD-10-CM | POA: Diagnosis not present

## 2018-04-18 DIAGNOSIS — K219 Gastro-esophageal reflux disease without esophagitis: Secondary | ICD-10-CM | POA: Diagnosis not present

## 2018-04-18 DIAGNOSIS — D539 Nutritional anemia, unspecified: Secondary | ICD-10-CM

## 2018-04-18 DIAGNOSIS — E876 Hypokalemia: Secondary | ICD-10-CM

## 2018-04-18 DIAGNOSIS — H9190 Unspecified hearing loss, unspecified ear: Secondary | ICD-10-CM | POA: Insufficient documentation

## 2018-04-18 LAB — BASIC METABOLIC PANEL
BUN: 22 mg/dL (ref 6–23)
CHLORIDE: 102 meq/L (ref 96–112)
CO2: 28 mEq/L (ref 19–32)
CREATININE: 1.09 mg/dL (ref 0.40–1.20)
Calcium: 9.2 mg/dL (ref 8.4–10.5)
GFR: 52.33 mL/min — AB (ref >60.00)
Glucose, Bld: 88 mg/dL (ref 70–99)
POTASSIUM: 4.1 meq/L (ref 3.5–5.1)
Sodium: 138 mEq/L (ref 135–145)

## 2018-04-18 LAB — CBC WITH DIFFERENTIAL/PLATELET
BASOS ABS: 0.1 10*3/uL (ref 0.0–0.1)
BASOS PCT: 1.3 % (ref 0.0–3.0)
EOS ABS: 0.2 10*3/uL (ref 0.0–0.7)
Eosinophils Relative: 3.7 % (ref 0.0–5.0)
HCT: 34.9 % — ABNORMAL LOW (ref 36.0–46.0)
Hemoglobin: 11.8 g/dL — ABNORMAL LOW (ref 12.0–15.0)
LYMPHS ABS: 2 10*3/uL (ref 0.7–4.0)
LYMPHS PCT: 31.5 % (ref 12.0–46.0)
MCHC: 33.8 g/dL (ref 30.0–36.0)
MCV: 90.8 fl (ref 78.0–100.0)
Monocytes Absolute: 0.5 10*3/uL (ref 0.1–1.0)
Monocytes Relative: 8.7 % (ref 3.0–12.0)
NEUTROS ABS: 3.4 10*3/uL (ref 1.4–7.7)
NEUTROS PCT: 54.8 % (ref 43.0–77.0)
PLATELETS: 312 10*3/uL (ref 150.0–400.0)
RBC: 3.84 Mil/uL — ABNORMAL LOW (ref 3.87–5.11)
RDW: 13.8 % (ref 11.5–15.5)
WBC: 6.2 10*3/uL (ref 4.0–10.5)

## 2018-04-18 NOTE — Assessment & Plan Note (Signed)
Protonix to cont

## 2018-04-18 NOTE — Assessment & Plan Note (Signed)
Audiology ref 

## 2018-04-18 NOTE — Assessment & Plan Note (Signed)
Labs

## 2018-04-18 NOTE — Assessment & Plan Note (Signed)
BP Readings from Last 3 Encounters:  04/18/18 118/72  01/11/18 114/76  11/15/17 109/69   Coreg, Diovan

## 2018-04-18 NOTE — Assessment & Plan Note (Signed)
CBC

## 2018-04-18 NOTE — Addendum Note (Signed)
Addended by: Trenda Moots on: 73/11/6699 10:12 AM   Modules accepted: Orders

## 2018-04-18 NOTE — Progress Notes (Signed)
Subjective:  Patient ID: Cindy Simpson, female    DOB: 12/03/1945  Age: 72 y.o. MRN: [id number]  CC: No chief complaint on file.   HPI Cindy Simpson presents for hearing loss, HTN, GERD f/u  Outpatient Medications Prior to Visit  Medication Sig Dispense Refill  . acetaminophen (TYLENOL) 500 MG tablet Take 500-1,000 mg by mouth every 6 (six) hours as needed (fore pain).     Marland Kitchen aspirin EC 81 MG EC tablet Take 1 tablet (81 mg total) by mouth daily. (Patient taking differently: Take 81 mg by mouth at bedtime. ) 30 tablet 3  . carvedilol (COREG) 6.25 MG tablet Take 6.25 mg by mouth 2 (two) times daily with a meal.     . Cetirizine HCl (ZYRTEC PO) Take by mouth.    . Cholecalciferol 1000 UNITS tablet Take 1,000 Units by mouth daily after supper.     Marland Kitchen glucose blood (ACCU-CHEK AVIVA) test strip Use to check blood sugars twice a day 100 each 5  . Lancets (ACCU-CHEK SOFT TOUCH) lancets Use to check blood sugars twice a day 100 each 5  . levothyroxine (SYNTHROID, LEVOTHROID) 75 MCG tablet TAKE 1 TABLET BY MOUTH  DAILY 90 tablet 3  . lovastatin (MEVACOR) 10 MG tablet TAKE 1 TABLET BY MOUTH  DAILY 90 tablet 2  . Multiple Vitamin (MULTIVITAMIN WITH MINERALS) TABS tablet Take 1 tablet by mouth daily.    . ondansetron (ZOFRAN ODT) 4 MG disintegrating tablet Take 1 tablet (4 mg total) by mouth every 8 (eight) hours as needed for nausea or vomiting. 15 tablet 0  . pantoprazole (PROTONIX) 40 MG tablet TAKE 1 TABLET BY MOUTH TWO  TIMES DAILY 180 tablet 3  . saccharomyces boulardii (FLORASTOR) 250 MG capsule Take 1 capsule (250 mg total) by mouth 2 (two) times daily. 60 capsule 0  . TURMERIC PO Take by mouth.    . valsartan (DIOVAN) 80 MG tablet Take 1 tablet (80 mg total) by mouth daily. 90 tablet 3   Facility-Administered Medications Prior to Visit  Medication Dose Route Frequency Provider Last Rate Last Dose  . 0.9 %  sodium chloride infusion  500 mL Intravenous Once Charlie Pitter III, MD         ROS: Review of Systems  Constitutional: Negative for activity change, appetite change, chills, fatigue and unexpected weight change.  HENT: Positive for hearing loss. Negative for congestion, mouth sores and sinus pressure.   Eyes: Negative for visual disturbance.  Respiratory: Negative for cough and chest tightness.   Gastrointestinal: Negative for abdominal pain and nausea.  Genitourinary: Negative for difficulty urinating, frequency and vaginal pain.  Musculoskeletal: Positive for arthralgias. Negative for back pain and gait problem.  Skin: Negative for pallor and rash.  Neurological: Negative for dizziness, tremors, weakness, numbness and headaches.  Psychiatric/Behavioral: Negative for confusion and sleep disturbance.    Objective:  BP 118/72 (BP Location: Left Arm, Patient Position: Sitting, Cuff Size: Normal)   Pulse 75   Temp 97.9 F (36.6 C) (Oral)   Ht 5\' 1"  (1.549 m)   Wt 158 lb (71.7 kg)   SpO2 93%   BMI 29.85 kg/m   BP Readings from Last 3 Encounters:  04/18/18 118/72  01/11/18 114/76  11/15/17 109/69    Wt Readings from Last 3 Encounters:  04/18/18 158 lb (71.7 kg)  01/11/18 158 lb (71.7 kg)  11/15/17 160 lb (72.6 kg)    Physical Exam  Constitutional: She appears well-developed. No distress.  HENT:  Head: Normocephalic.  Right Ear: External ear normal.  Left Ear: External ear normal.  Nose: Nose normal.  Mouth/Throat: Oropharynx is clear and moist.  Eyes: Pupils are equal, round, and reactive to light. Conjunctivae are normal. Right eye exhibits no discharge. Left eye exhibits no discharge.  Neck: Normal range of motion. Neck supple. No JVD present. No tracheal deviation present. No thyromegaly present.  Cardiovascular: Normal rate, regular rhythm and normal heart sounds.  Pulmonary/Chest: No stridor. No respiratory distress. She has no wheezes.  Abdominal: Soft. Bowel sounds are normal. She exhibits no distension and no mass. There is no  tenderness. There is no rebound and no guarding.  Musculoskeletal: She exhibits no edema or tenderness.  Lymphadenopathy:    She has no cervical adenopathy.  Neurological: She displays normal reflexes. No cranial nerve deficit. She exhibits normal muscle tone. Coordination normal.  Skin: No rash noted. No erythema.  Psychiatric: She has a normal mood and affect. Her behavior is normal. Judgment and thought content normal.    Lab Results  Component Value Date   WBC 29.8 (H) 04/05/2017   HGB 12.3 04/05/2017   HCT 36.8 04/05/2017   PLT 375 04/05/2017   GLUCOSE 89 09/15/2017   CHOL 166 09/14/2015   TRIG 93 09/14/2015   HDL 45 09/14/2015   LDLDIRECT 137.5 03/17/2009   LDLCALC 102 (H) 09/14/2015   ALT 16 04/05/2017   AST 21 04/05/2017   NA 140 09/15/2017   K 4.1 09/15/2017   CL 104 09/15/2017   CREATININE 0.87 09/15/2017   BUN 22 09/15/2017   CO2 28 09/15/2017   TSH 2.54 08/31/2016   INR 0.99 03/03/2016   HGBA1C 6.1 10/12/2016    Ct Abdomen Pelvis W Contrast  Result Date: 04/05/2017 CLINICAL DATA:  Abdominal pain, constipation and diarrhea. EXAM: CT ABDOMEN AND PELVIS WITH CONTRAST TECHNIQUE: Multidetector CT imaging of the abdomen and pelvis was performed using the standard protocol following bolus administration of intravenous contrast. CONTRAST:  80mL ISOVUE-300 IOPAMIDOL (ISOVUE-300) INJECTION 61% COMPARISON:  None. FINDINGS: Lower chest: Minor right base atelectasis versus scarring. No significant lower lobe pneumonia or acute process. Nissen fundoplication repair noted. No significant large recurrent hernia. Normal heart size. No pericardial or pleural effusion. Hepatobiliary: Small punctate hepatic granulomata along the gallbladder. No other significant hepatic abnormality. No biliary dilatation. Patent hepatic and portal veins. Moderate distention of the gallbladder but no surrounding edema or inflammation. Biliary system unremarkable. Pancreas: Unremarkable. No pancreatic  ductal dilatation or surrounding inflammatory changes. Spleen: Normal in size without focal abnormality. Adrenals/Urinary Tract: Normal adrenal glands. Right kidney upper pole cyst measures 4.7 cm. No renal obstruction or hydronephrosis. No hydroureter, ureteral calculus, or definite bladder abnormality. Stomach/Bowel: Negative for bowel obstruction, significant dilatation, ileus, or free air. Normal appendix demonstrated. Extensive colonic diverticulosis, most pronounced in the sigmoid and rectum. No associated acute inflammatory process, edema, free fluid, fluid collection, abscess, or perforation. Vascular/Lymphatic: Aortic atherosclerosis noted. Negative for aneurysm. Mesenteric and renal vasculature appear patent. No adenopathy. Reproductive: Remote hysterectomy. No pelvic mass or adnexal abnormality. Other: No inguinal abnormality. Small midline fat containing ventral hernia, image 25 series 2. Musculoskeletal: Diffuse osteopenia. Degenerative changes throughout the spine. No acute compression fracture. Sacral Tarlov cyst noted. IMPRESSION: No acute intra-abdominal or pelvic finding by CT. Previous Nissen fundoplication repair of the hiatal hernia without recurrence. Abdominal atherosclerosis Extensive colonic diverticulosis without CT evidence of acute diverticulitis. No fluid collection or abscess Remote hysterectomy Normal appendix Small fat containing midline ventral hernia Electronically Signed  By: Osvaldo Shipper M.D.   On: 04/05/2017 18:02    Assessment & Plan:   There are no diagnoses linked to this encounter.   No orders of the defined types were placed in this encounter.    Follow-up: No follow-ups on file.  [name], MD

## 2018-04-19 ENCOUNTER — Other Ambulatory Visit: Payer: Medicare Other

## 2018-04-19 DIAGNOSIS — R197 Diarrhea, unspecified: Secondary | ICD-10-CM

## 2018-04-21 LAB — SPECIMEN STATUS REPORT

## 2018-04-21 LAB — CLOSTRIDIUM DIFFICILE EIA: C DIFFICILE TOXINS A+ B, EIA: NEGATIVE

## 2018-05-01 ENCOUNTER — Encounter: Payer: Self-pay | Admitting: Internal Medicine

## 2018-05-01 DIAGNOSIS — H906 Mixed conductive and sensorineural hearing loss, bilateral: Secondary | ICD-10-CM | POA: Diagnosis not present

## 2018-05-08 DIAGNOSIS — H903 Sensorineural hearing loss, bilateral: Secondary | ICD-10-CM | POA: Diagnosis not present

## 2018-05-08 DIAGNOSIS — J3081 Allergic rhinitis due to animal (cat) (dog) hair and dander: Secondary | ICD-10-CM | POA: Diagnosis not present

## 2018-05-08 DIAGNOSIS — H902 Conductive hearing loss, unspecified: Secondary | ICD-10-CM | POA: Diagnosis not present

## 2018-05-08 DIAGNOSIS — J301 Allergic rhinitis due to pollen: Secondary | ICD-10-CM | POA: Diagnosis not present

## 2018-05-08 DIAGNOSIS — H6523 Chronic serous otitis media, bilateral: Secondary | ICD-10-CM | POA: Diagnosis not present

## 2018-06-12 ENCOUNTER — Other Ambulatory Visit: Payer: Self-pay | Admitting: Internal Medicine

## 2018-06-26 DIAGNOSIS — I10 Essential (primary) hypertension: Secondary | ICD-10-CM | POA: Diagnosis not present

## 2018-06-26 DIAGNOSIS — I2510 Atherosclerotic heart disease of native coronary artery without angina pectoris: Secondary | ICD-10-CM | POA: Diagnosis not present

## 2018-06-26 DIAGNOSIS — R002 Palpitations: Secondary | ICD-10-CM | POA: Diagnosis not present

## 2018-06-26 DIAGNOSIS — I252 Old myocardial infarction: Secondary | ICD-10-CM | POA: Diagnosis not present

## 2018-06-26 DIAGNOSIS — I5181 Takotsubo syndrome: Secondary | ICD-10-CM | POA: Diagnosis not present

## 2018-07-05 ENCOUNTER — Emergency Department (HOSPITAL_COMMUNITY): Payer: Medicare Other

## 2018-07-05 ENCOUNTER — Other Ambulatory Visit: Payer: Self-pay

## 2018-07-05 ENCOUNTER — Emergency Department (HOSPITAL_COMMUNITY)
Admission: EM | Admit: 2018-07-05 | Discharge: 2018-07-06 | Disposition: A | Discharge status: Home / Self Care | Payer: Medicare Other | Attending: Emergency Medicine | Admitting: Emergency Medicine

## 2018-07-05 ENCOUNTER — Encounter (HOSPITAL_COMMUNITY): Payer: Self-pay

## 2018-07-05 DIAGNOSIS — Z79899 Other long term (current) drug therapy: Secondary | ICD-10-CM | POA: Insufficient documentation

## 2018-07-05 DIAGNOSIS — R1084 Generalized abdominal pain: Secondary | ICD-10-CM | POA: Diagnosis not present

## 2018-07-05 DIAGNOSIS — E039 Hypothyroidism, unspecified: Secondary | ICD-10-CM | POA: Insufficient documentation

## 2018-07-05 DIAGNOSIS — R140 Abdominal distension (gaseous): Secondary | ICD-10-CM | POA: Diagnosis not present

## 2018-07-05 DIAGNOSIS — R1030 Lower abdominal pain, unspecified: Secondary | ICD-10-CM | POA: Diagnosis not present

## 2018-07-05 DIAGNOSIS — R112 Nausea with vomiting, unspecified: Secondary | ICD-10-CM | POA: Insufficient documentation

## 2018-07-05 DIAGNOSIS — R197 Diarrhea, unspecified: Secondary | ICD-10-CM | POA: Insufficient documentation

## 2018-07-05 LAB — COMPREHENSIVE METABOLIC PANEL
ALBUMIN: 4.5 g/dL (ref 3.5–5.0)
ALT: 15 U/L (ref 0–44)
ANION GAP: 13 (ref 5–15)
AST: 25 U/L (ref 15–41)
Alkaline Phosphatase: 91 U/L (ref 38–126)
BUN: 25 mg/dL — AB (ref 8–23)
CHLORIDE: 107 mmol/L (ref 98–111)
CO2: 18 mmol/L — AB (ref 22–32)
Calcium: 9.6 mg/dL (ref 8.9–10.3)
Creatinine, Ser: 1.16 mg/dL — ABNORMAL HIGH (ref 0.44–1.00)
GFR calc Af Amer: 54 mL/min — ABNORMAL LOW (ref >60)
GFR calc non Af Amer: 47 mL/min — ABNORMAL LOW (ref >60)
GLUCOSE: 120 mg/dL — AB (ref 70–99)
Potassium: 4 mmol/L (ref 3.5–5.1)
SODIUM: 138 mmol/L (ref 135–145)
TOTAL PROTEIN: 7.8 g/dL (ref 6.5–8.1)
Total Bilirubin: 0.4 mg/dL (ref 0.3–1.2)

## 2018-07-05 LAB — CBC
HCT: 39.3 % (ref 36.0–46.0)
Hemoglobin: 12.3 g/dL (ref 12.0–15.0)
MCH: 30.4 pg (ref 26.0–34.0)
MCHC: 31.3 g/dL (ref 30.0–36.0)
MCV: 97 fL (ref 80.0–100.0)
Platelets: 361 10*3/uL (ref 150–400)
RBC: 4.05 MIL/uL (ref 3.87–5.11)
RDW: 13.5 % (ref 11.5–15.5)
WBC: 14.2 10*3/uL — ABNORMAL HIGH (ref 4.0–10.5)
nRBC: 0 % (ref 0.0–0.2)

## 2018-07-05 LAB — LIPASE, BLOOD: Lipase: 40 U/L (ref 11–51)

## 2018-07-05 MED ORDER — IOPAMIDOL (ISOVUE-300) INJECTION 61%
100.0000 mL | Freq: Once | INTRAVENOUS | Status: AC | PRN
  Administered 2018-07-06: 80 mL via INTRAVENOUS

## 2018-07-05 MED ORDER — MORPHINE SULFATE (PF) 4 MG/ML IV SOLN
4.0000 mg | Freq: Once | INTRAVENOUS | Status: AC
  Administered 2018-07-05: 4 mg via INTRAVENOUS
  Filled 2018-07-05: qty 1

## 2018-07-05 MED ORDER — IOPAMIDOL (ISOVUE-300) INJECTION 61%
INTRAVENOUS | Status: AC
  Filled 2018-07-05: qty 100

## 2018-07-05 MED ORDER — SODIUM CHLORIDE 0.9 % IV BOLUS
500.0000 mL | Freq: Once | INTRAVENOUS | Status: AC
  Administered 2018-07-05: 500 mL via INTRAVENOUS

## 2018-07-05 MED ORDER — SODIUM CHLORIDE 0.9 % IV BOLUS
1000.0000 mL | Freq: Once | INTRAVENOUS | Status: AC
  Administered 2018-07-05: 1000 mL via INTRAVENOUS

## 2018-07-05 MED ORDER — SODIUM CHLORIDE (PF) 0.9 % IJ SOLN
INTRAMUSCULAR | Status: AC
  Filled 2018-07-05: qty 50

## 2018-07-05 MED ORDER — ONDANSETRON HCL 4 MG/2ML IJ SOLN
4.0000 mg | Freq: Once | INTRAMUSCULAR | Status: AC
  Administered 2018-07-05: 4 mg via INTRAVENOUS
  Filled 2018-07-05: qty 2

## 2018-07-05 NOTE — ED Notes (Signed)
Pt aware that a urine sample is needed. Pt was not informed before she went to the bathroom previously.

## 2018-07-05 NOTE — ED Provider Notes (Signed)
[facility] DEPT Provider Note   CSN: [id number] Arrival date & time: 07/05/18  2059  Time seen 23:24 PM    History   Chief Complaint Chief Complaint  Patient presents with  . Abdominal Pain    HPI Cindy Simpson is a 72 y.o. female.  HPI patient states about 7:30 PM after eating chili beings which she has eaten before without difficulty, she started having left lower abdominal pain that radiated towards the right and now has become diffuse.  She describes the pain is cramping and it comes and goes.  She has had nausea without vomiting.  She states she had one massive explosive episode of diarrhea in the ED with watery content afterwards.  She denies the stool being black or bloody.  She denies any fever.  She states she has had some episodes before but they were not as bad as this 1 or lasted as long as this 1.  She has been told that she may have diverticulitis but she is never been treated with antibiotics.  She states nothing she does makes it feel better, nothing she does makes it feel worse.  She states her abdomen was distended and felt hard earlier.  Patient has a history of irregular heartbeat but denies being on blood thinners.  PCP [name], Evie Lacks, MD   Past Medical History:  Diagnosis Date  . Allergic rhinitis   . Anemia   . Asthmatic bronchitis 2009   allergies; post nasal drip but not allergies  . GERD (gastroesophageal reflux disease)   . History of hiatal hernia   . Hyperlipidemia   . Hypothyroidism   . IBS (irritable bowel syndrome)   . Insomnia   . Irregular heart beat Not sure; long time ago   Periodic irregular heart rate; take verapamil.  . Status post dilation of esophageal narrowing   . Torticollis 2006   Estimate date; rec'd botox and resolved    Patient Active Problem List   Diagnosis Date Noted  . Hearing loss 04/18/2018  . Diarrhea 04/20/2017  . Constipation 01/17/2017  . Right hip pain 10/12/2016  .  Chest wall pain 06/29/2016  . Hiatal hernia with gastroesophageal reflux 03/09/2016  . Deficiency anemia 11/03/2015  . Hyperglycemia 11/03/2015  . Eczema of both hands 02/28/2015  . Heart murmur 02/28/2015  . Onychomycosis 02/18/2014  . Callus of foot 02/18/2014  . Dizziness 08/14/2013  . Cheilitis 10/16/2012  . Hypokalemia 06/22/2012  . Leukocytosis 06/22/2012  . Viral gastroenteritis with intractable nausea and vomiting 06/22/2012  . Well adult exam 04/17/2012  . Right shoulder pain 04/17/2012  . Hypertension 10/16/2010  . HIP PAIN 03/24/2010  . Acute upper respiratory infection 10/20/2009  . ARTHRALGIA 10/20/2009  . Spell of generalized weakness 10/20/2009  . Hypothyroidism 03/15/2008  . Dyslipidemia 03/15/2008  . INSOMNIA, PERSISTENT 03/15/2008  . ALLERGIC RHINITIS 03/15/2008  . Asthma 03/15/2008  . GERD 03/15/2008    Past Surgical History:  Procedure Laterality Date  . ABDOMINAL HYSTERECTOMY     with ovaries removed also.  Marland Kitchen CARDIAC CATHETERIZATION N/A 09/13/2015   Procedure: Left Heart Cath and Coronary Angiography;  Surgeon: Charolette Forward, MD;  Location: [facility];  Service: Cardiovascular;  Laterality: N/A;  . CATARACT EXTRACTION Bilateral 2014   approx 2 years ago  . CLEFT PALATE REPAIR     child  . ESOPHAGOGASTRODUODENOSCOPY (EGD) WITH PROPOFOL N/A 12/30/2015   Procedure: ESOPHAGOGASTRODUODENOSCOPY (EGD) WITH PROPOFOL;  Surgeon: Doran Stabler, MD;  Location: [facility];  Service: Gastroenterology;  Laterality: N/A;  . HIATAL HERNIA REPAIR N/A 03/09/2016   Procedure: LAPAROSCOPIC REPAIR OF HIATAL HERNIA  NISSEN FUNDOPLICATION UPPER ENDOSCOPY;  Surgeon: Jackolyn Confer, MD;  Location: [facility];  Service: General;  Laterality: N/A;  . NISSEN FUNDOPLICATION N/A 2/35/5732   Procedure: NISSEN FUNDOPLICATION;  Surgeon: Jackolyn Confer, MD;  Location: [facility];  Service: General;  Laterality: N/A;  . TUBAL LIGATION Bilateral 1985  . UPPER GI ENDOSCOPY  03/09/2016    Procedure: UPPER GI ENDOSCOPY;  Surgeon: Jackolyn Confer, MD;  Location: [facility];  Service: General;;     OB History   No obstetric history on file.      Home Medications    Prior to Admission medications   Medication Sig Start Date End Date Taking? Authorizing Provider  aspirin EC 325 MG tablet Take 325 mg by mouth every evening.   Yes [provider]  bismuth subsalicylate (PEPTO BISMOL) 262 MG/15ML suspension Take 30 mLs by mouth every 6 (six) hours as needed for indigestion.   Yes [provider]  carvedilol (COREG) 6.25 MG tablet Take 6.25 mg by mouth 2 (two) times daily with a meal.  07/21/16  Yes [provider]  cetirizine (ZYRTEC) 10 MG tablet Take 10 mg by mouth at bedtime.   Yes [provider]  Cholecalciferol 1000 UNITS tablet Take 1,000 Units by mouth daily after supper.  10/13/10  Yes [name], Evie Lacks, MD  levothyroxine (SYNTHROID, LEVOTHROID) 75 MCG tablet TAKE 1 TABLET BY MOUTH  DAILY 03/09/18  Yes [name], Evie Lacks, MD  lovastatin (MEVACOR) 10 MG tablet TAKE 1 TABLET BY MOUTH  DAILY 06/12/18  Yes [name], Evie Lacks, MD  Multiple Vitamin (MULTIVITAMIN WITH MINERALS) TABS tablet Take 1 tablet by mouth every evening.    Yes [provider]  pantoprazole (PROTONIX) 40 MG tablet TAKE 1 TABLET BY MOUTH TWO  TIMES DAILY 08/30/17  Yes [name], Evie Lacks, MD  Probiotic CAPS Take 1 capsule by mouth 2 (two) times daily.   Yes [provider]  simethicone (MYLICON) 80 MG chewable tablet Chew 80 mg by mouth every 6 (six) hours as needed for flatulence.   Yes [provider]  TURMERIC PO Take 1 tablet by mouth daily.    Yes [provider]  valsartan (DIOVAN) 80 MG tablet Take 1 tablet (80 mg total) by mouth daily. 01/11/18  Yes [name], Evie Lacks, MD  acetaminophen (TYLENOL) 500 MG tablet Take 500-1,000 mg by mouth every 6 (six) hours as needed (fore pain).     [provider]  aspirin EC 81 MG  EC tablet Take 1 tablet (81 mg total) by mouth daily. Patient not taking: Reported on 07/05/2018 09/14/15   Charolette Forward, MD  glucose blood (ACCU-CHEK AVIVA) test strip Use to check blood sugars twice a day 10/25/16   [name], Evie Lacks, MD  Lancets (ACCU-CHEK SOFT TOUCH) lancets Use to check blood sugars twice a day 10/25/16   [name], Evie Lacks, MD  ondansetron (ZOFRAN ODT) 4 MG disintegrating tablet Take 1 tablet (4 mg total) by mouth every 8 (eight) hours as needed for nausea or vomiting. Patient not taking: Reported on 07/05/2018 04/05/17   Rodell Perna A, PA-C  promethazine (PHENERGAN) 25 MG tablet Take 1 tablet (25 mg total) by mouth every 6 (six) hours as needed for nausea or vomiting. 07/06/18   Rolland Porter, MD  saccharomyces boulardii (FLORASTOR) 250 MG capsule Take 1 capsule (250 mg total) by mouth  2 (two) times daily. Patient not taking: Reported on 07/05/2018 04/20/17   [name], Evie Lacks, MD    Family History Family History  Problem Relation Age of Onset  . Hypertension Mother   . Heart disease Father   . Stroke Father   . Stroke Brother        brain aneurism  . Colon polyps Sister   . Other Sister        c diff-deceased    Social History Social History   Tobacco Use  . Smoking status: Never Smoker  . Smokeless tobacco: Never Used  Substance Use Topics  . Alcohol use: Yes    Alcohol/week: 1.0 - 2.0 standard drinks    Types: 1 - 2 Shots of liquor per week    Comment: Perhaps every couple of weeks; not very frequently  . Drug use: No     Allergies   Lisinopril; Vinegar [acetic acid]; and Metoclopramide hcl   Review of Systems Review of Systems  All other systems reviewed and are negative.    Physical Exam Updated Vital Signs BP (!) 142/79 (BP Location: Left Arm)   Pulse 90   Temp (!) 97.5 F (36.4 C) (Oral)   Resp 20   Ht 5\' 1"  (1.549 m)   Wt 71.7 kg   SpO2 96%   BMI 29.85 kg/m   Vital signs normal    Physical Exam Vitals signs and nursing  note reviewed.  Constitutional:      General: She is not in acute distress.    Appearance: Normal appearance. She is well-developed. She is not ill-appearing or toxic-appearing.  HENT:     Head: Normocephalic and atraumatic.     Right Ear: External ear normal.     Left Ear: External ear normal.     Nose: Nose normal. No mucosal edema or rhinorrhea.     Mouth/Throat:     Mouth: Mucous membranes are dry.     Dentition: No dental abscesses.     Pharynx: No uvula swelling.  Eyes:     Conjunctiva/sclera: Conjunctivae normal.     Pupils: Pupils are equal, round, and reactive to light.  Neck:     Musculoskeletal: Full passive range of motion without pain, normal range of motion and neck supple.  Cardiovascular:     Rate and Rhythm: Normal rate and regular rhythm.     Heart sounds: Normal heart sounds. No murmur. No friction rub. No gallop.   Pulmonary:     Effort: Pulmonary effort is normal. No respiratory distress.     Breath sounds: Normal breath sounds. No wheezing, rhonchi or rales.  Chest:     Chest wall: No tenderness or crepitus.  Abdominal:     General: Bowel sounds are increased. There is distension.     Palpations: Abdomen is soft.     Tenderness: There is generalized abdominal tenderness. There is no guarding or rebound.     Comments: I can hear peristalsis sitting at the bedside.  Musculoskeletal: Normal range of motion.        General: No tenderness.     Comments: Moves all extremities well.   Skin:    General: Skin is warm and dry.     Coloration: Skin is not pale.     Findings: No erythema or rash.  Neurological:     Mental Status: She is alert and oriented to person, place, and time.     Cranial Nerves: No cranial nerve deficit.  Psychiatric:  Mood and Affect: Mood is not anxious.        Speech: Speech normal.        Behavior: Behavior normal.      ED Treatments / Results  Labs (all labs ordered are listed, but only abnormal results are  displayed) Results for orders placed or performed during the hospital encounter of 07/05/18  Lipase, blood  Result Value Ref Range   Lipase 40 11 - 51 U/L  Comprehensive metabolic panel  Result Value Ref Range   Sodium 138 135 - 145 mmol/L   Potassium 4.0 3.5 - 5.1 mmol/L   Chloride 107 98 - 111 mmol/L   CO2 18 (L) 22 - 32 mmol/L   Glucose, Bld 120 (H) 70 - 99 mg/dL   BUN 25 (H) 8 - 23 mg/dL   Creatinine, Ser 1.16 (H) 0.44 - 1.00 mg/dL   Calcium 9.6 8.9 - 10.3 mg/dL   Total Protein 7.8 6.5 - 8.1 g/dL   Albumin 4.5 3.5 - 5.0 g/dL   AST 25 15 - 41 U/L   ALT 15 0 - 44 U/L   Alkaline Phosphatase 91 38 - 126 U/L   Total Bilirubin 0.4 0.3 - 1.2 mg/dL   GFR calc non Af Amer 47 (L) >60 mL/min   GFR calc Af Amer 54 (L) >60 mL/min   Anion gap 13 5 - 15  CBC  Result Value Ref Range   WBC 14.2 (H) 4.0 - 10.5 K/uL   RBC 4.05 3.87 - 5.11 MIL/uL   Hemoglobin 12.3 12.0 - 15.0 g/dL   HCT 39.3 36.0 - 46.0 %   MCV 97.0 80.0 - 100.0 fL   MCH 30.4 26.0 - 34.0 pg   MCHC 31.3 30.0 - 36.0 g/dL   RDW 13.5 11.5 - 15.5 %   Platelets 361 150 - 400 K/uL   nRBC 0.0 0.0 - 0.2 %  Urinalysis, Routine w reflex microscopic  Result Value Ref Range   Color, Urine YELLOW YELLOW   APPearance CLEAR CLEAR   Specific Gravity, Urine 1.018 1.005 - 1.030   pH 5.0 5.0 - 8.0   Glucose, UA NEGATIVE NEGATIVE mg/dL   Hgb urine dipstick NEGATIVE NEGATIVE   Bilirubin Urine NEGATIVE NEGATIVE   Ketones, ur NEGATIVE NEGATIVE mg/dL   Protein, ur NEGATIVE NEGATIVE mg/dL   Nitrite NEGATIVE NEGATIVE   Leukocytes, UA TRACE (A) NEGATIVE   RBC / HPF 0-5 0 - 5 RBC/hpf   WBC, UA 0-5 0 - 5 WBC/hpf   Bacteria, UA NONE SEEN NONE SEEN   Squamous Epithelial / LPF 0-5 0 - 5   Mucus PRESENT    Laboratory interpretation all normal except leukocytosis, stable renal insufficiency   EKG None  Radiology Ct Abdomen Pelvis W Contrast  Result Date: 07/06/2018 CLINICAL DATA:  Mid to lower abdominal pain. No relief from  medications. Elevated white cells. EXAM: CT ABDOMEN AND PELVIS WITH CONTRAST TECHNIQUE: Multidetector CT imaging of the abdomen and pelvis was performed using the standard protocol following bolus administration of intravenous contrast. CONTRAST:  2mL ISOVUE-300 IOPAMIDOL (ISOVUE-300) INJECTION 61% COMPARISON:  04/05/2017 FINDINGS: Lower chest: Focal infiltration in the right lung base is unchanged since previous study, possibly indicating scarring. Postoperative changes at the EG junction consistent with fundoplication. Visualized distal esophagus appears mildly dilated and fluid-filled. This could indicate dysmotility, reflux, or distal esophageal stricture. Hepatobiliary: No focal liver abnormality is seen. No gallstones, gallbladder wall thickening, or biliary dilatation. Pancreas: Unremarkable. No pancreatic ductal dilatation or surrounding inflammatory  changes. Spleen: Normal in size without focal abnormality. Adrenals/Urinary Tract: No adrenal gland nodules. Cyst in the upper pole right kidney without change. No hydronephrosis or hydroureter. Nephrograms are symmetrical. Bladder is decompressed. Stomach/Bowel: Stomach, small bowel, and colon are not abnormally distended. Prominent colonic diverticulosis without obvious diverticulitis. No abscess. Mild diffuse sigmoid colon wall thickening probably relates to muscular hypertrophy. Appendix is prominent but is mostly air-filled to the distal tip, similar to previous study. No definite inflammatory changes period. Vascular/Lymphatic: Aortic atherosclerosis. No enlarged abdominal or pelvic lymph nodes. Reproductive: Status post hysterectomy. No adnexal masses. Other: No free air or free fluid in the abdomen. Small periumbilical hernia containing fat. Small ventral anterior abdominal wall hernia below the umbilicus containing fat. No evidence of fat necrosis. Musculoskeletal: Mild degenerative changes in the spine. No destructive bone lesions. IMPRESSION: 1. No  acute process demonstrated in the abdomen or pelvis. No evidence of bowel obstruction. Prominent colonic diverticulosis without evidence of diverticulitis. Appendix is prominent but is mostly air-filled to the distal tip, similar to previous study. No definite inflammatory changes to suggest appendicitis. 2. Postoperative fundoplication with mild fluid distention of the distal esophagus. 3. Aortic atherosclerosis. Electronically Signed   By: Lucienne Capers M.D.   On: 07/06/2018 00:51    Procedures Procedures (including critical care time)  Medications Ordered in ED Medications  iopamidol (ISOVUE-300) 61 % injection (has no administration in time range)  sodium chloride (PF) 0.9 % injection (has no administration in time range)  morphine 4 MG/ML injection 4 mg (4 mg Intravenous Given 07/05/18 2356)  ondansetron (ZOFRAN) injection 4 mg (4 mg Intravenous Given 07/05/18 2354)  sodium chloride 0.9 % bolus 1,000 mL (0 mLs Intravenous Stopped 07/06/18 0142)  sodium chloride 0.9 % bolus 500 mL (0 mLs Intravenous Stopped 07/06/18 0142)  iopamidol (ISOVUE-300) 61 % injection 100 mL (80 mLs Intravenous Contrast Given 07/06/18 0022)  promethazine (PHENERGAN) injection 25 mg (25 mg Intramuscular Given 07/06/18 0325)     Initial Impression / Assessment and Plan / ED Course  I have reviewed the triage vital signs and the nursing notes.  Pertinent labs & imaging results that were available during my care of the patient were reviewed by me and considered in my medical decision making (see chart for details).     Patient was given IV fluids, she appears to be dehydrated, IV nausea and pain medication.  Patient symptoms are concerning for diverticulitis, possible perforation with now generalized pain, or partial bowel obstruction with her distention and extremely audible bowel sounds.  CT abdomen was done.  Recheck at 3:00 AM patient states her pain is gone however she still is having nausea.  She states  the Zofran did not help and she was given Phenergan IM.  Recheck at 4:45 AM patient states her pain is gone, her nausea is gone.  She is finished her IV fluids.  She feels able to try to drink some fluids and see if it makes her feel worse.  Nurse reports patient was able to drink fluids and she did well, she was able to ambulate to the bathroom.  She was discharged home.  We discussed her discharge instructions, she would be given nausea medication, she can take Imodium if needed for diarrhea.  We discussed drinking fluids this morning, then a bland diet this afternoon and progressed slowly to a regular diet.  She should probably avoid chili beings for a while.  Final Clinical Impressions(s) / ED Diagnoses   Final diagnoses:  Generalized  abdominal pain  Nausea vomiting and diarrhea    ED Discharge Orders         Ordered    promethazine (PHENERGAN) 25 MG tablet  Every 6 hours PRN     07/06/18 0520         Plan discharge  Rolland Porter, MD, Barbette Or, MD 07/06/18 [phone]

## 2018-07-05 NOTE — ED Triage Notes (Signed)
Pt arriving with abdominal pain that began around 730pm. Pt reports the pain extends from the mid upper area to lower abdomen. Pt has taken pepto bismol and gas-x prior to arrival with no relief.

## 2018-07-06 DIAGNOSIS — R140 Abdominal distension (gaseous): Secondary | ICD-10-CM | POA: Diagnosis not present

## 2018-07-06 DIAGNOSIS — R1030 Lower abdominal pain, unspecified: Secondary | ICD-10-CM | POA: Diagnosis not present

## 2018-07-06 LAB — URINALYSIS, ROUTINE W REFLEX MICROSCOPIC
Bacteria, UA: NONE SEEN
Bilirubin Urine: NEGATIVE
Glucose, UA: NEGATIVE mg/dL
Hgb urine dipstick: NEGATIVE
Ketones, ur: NEGATIVE mg/dL
Nitrite: NEGATIVE
Protein, ur: NEGATIVE mg/dL
Specific Gravity, Urine: 1.018 (ref 1.005–1.030)
pH: 5 (ref 5.0–8.0)

## 2018-07-06 MED ORDER — PROMETHAZINE HCL 25 MG/ML IJ SOLN
25.0000 mg | Freq: Once | INTRAMUSCULAR | Status: AC
  Administered 2018-07-06: 25 mg via INTRAMUSCULAR
  Filled 2018-07-06: qty 1

## 2018-07-06 MED ORDER — PROMETHAZINE HCL 25 MG PO TABS: 25.0000 mg | ORAL_TABLET | Freq: Four times a day (QID) | ORAL | 0 refills | Status: DC | PRN

## 2018-07-06 NOTE — ED Notes (Signed)
Patient transported to CT 

## 2018-07-06 NOTE — Discharge Instructions (Addendum)
Drink plenty of fluids (clear liquids) then start a bland diet later this morning such as toast, crackers, jello, [employer] chicken noodle soup. Use the phenergan for nausea or vomiting. Take imodium OTC for diarrhea. Avoid milk products until the diarrhea is gone.

## 2018-07-06 NOTE — ED Notes (Signed)
Pt ambulated to the restroom steadily without needing assistance.

## 2018-07-06 NOTE — ED Notes (Signed)
Pt states that the pain is gone, but she is feeling very nauseous. This RN explained that the phenergan is designed to help with that and we will reevaluate her nausea in 30-45 mins.

## 2018-07-06 NOTE — ED Notes (Signed)
Pt tolerating sips of water without nausea or vomiting.

## 2018-07-24 ENCOUNTER — Ambulatory Visit (INDEPENDENT_AMBULATORY_CARE_PROVIDER_SITE_OTHER): Payer: Medicare Other | Admitting: Internal Medicine

## 2018-07-24 ENCOUNTER — Encounter: Payer: Self-pay | Admitting: Internal Medicine

## 2018-07-24 DIAGNOSIS — K219 Gastro-esophageal reflux disease without esophagitis: Secondary | ICD-10-CM

## 2018-07-24 DIAGNOSIS — R1084 Generalized abdominal pain: Secondary | ICD-10-CM

## 2018-07-24 DIAGNOSIS — E034 Atrophy of thyroid (acquired): Secondary | ICD-10-CM | POA: Diagnosis not present

## 2018-07-24 DIAGNOSIS — R109 Unspecified abdominal pain: Secondary | ICD-10-CM | POA: Insufficient documentation

## 2018-07-24 DIAGNOSIS — I10 Essential (primary) hypertension: Secondary | ICD-10-CM

## 2018-07-24 NOTE — Assessment & Plan Note (Signed)
Protonix.  ?

## 2018-07-24 NOTE — Assessment & Plan Note (Signed)
Levothroid 

## 2018-07-24 NOTE — Assessment & Plan Note (Signed)
On Coreg, Diovan

## 2018-07-24 NOTE — Progress Notes (Signed)
Subjective:  Patient ID: Cindy Simpson, female    DOB: [birth year]  Age: 73 y.o. MRN: [id number]  CC: No chief complaint on file.   HPI Cindy Simpson presents for 3 mo f/u - HTN, hypothyroidism, GERD f/u F/u ER visit for abd pain 07/05/18 - CT abd was OK C/o nausea  Outpatient Medications Prior to Visit  Medication Sig Dispense Refill  . acetaminophen (TYLENOL) 500 MG tablet Take 500-1,000 mg by mouth every 6 (six) hours as needed (fore pain).     Marland Kitchen aspirin EC 325 MG tablet Take 325 mg by mouth every evening.    Marland Kitchen aspirin EC 81 MG EC tablet Take 1 tablet (81 mg total) by mouth daily. 30 tablet 3  . bismuth subsalicylate (PEPTO BISMOL) 262 MG/15ML suspension Take 30 mLs by mouth every 6 (six) hours as needed for indigestion.    . carvedilol (COREG) 6.25 MG tablet Take 6.25 mg by mouth 2 (two) times daily with a meal.     . cetirizine (ZYRTEC) 10 MG tablet Take 10 mg by mouth at bedtime.    . Cholecalciferol 1000 UNITS tablet Take 1,000 Units by mouth daily after supper.     Marland Kitchen glucose blood (ACCU-CHEK AVIVA) test strip Use to check blood sugars twice a day 100 each 5  . Lancets (ACCU-CHEK SOFT TOUCH) lancets Use to check blood sugars twice a day 100 each 5  . levothyroxine (SYNTHROID, LEVOTHROID) 75 MCG tablet TAKE 1 TABLET BY MOUTH  DAILY 90 tablet 3  . lovastatin (MEVACOR) 10 MG tablet TAKE 1 TABLET BY MOUTH  DAILY 90 tablet 2  . Multiple Vitamin (MULTIVITAMIN WITH MINERALS) TABS tablet Take 1 tablet by mouth every evening.     . ondansetron (ZOFRAN ODT) 4 MG disintegrating tablet Take 1 tablet (4 mg total) by mouth every 8 (eight) hours as needed for nausea or vomiting. 15 tablet 0  . pantoprazole (PROTONIX) 40 MG tablet TAKE 1 TABLET BY MOUTH TWO  TIMES DAILY 180 tablet 3  . Probiotic CAPS Take 1 capsule by mouth 2 (two) times daily.    . promethazine (PHENERGAN) 25 MG tablet Take 1 tablet (25 mg total) by mouth every 6 (six) hours as needed for nausea or vomiting. 4 tablet 0  .  saccharomyces boulardii (FLORASTOR) 250 MG capsule Take 1 capsule (250 mg total) by mouth 2 (two) times daily. 60 capsule 0  . simethicone (MYLICON) 80 MG chewable tablet Chew 80 mg by mouth every 6 (six) hours as needed for flatulence.    . TURMERIC PO Take 1 tablet by mouth daily.     . valsartan (DIOVAN) 80 MG tablet Take 1 tablet (80 mg total) by mouth daily. 90 tablet 3   Facility-Administered Medications Prior to Visit  Medication Dose Route Frequency Provider Last Rate Last Dose  . 0.9 %  sodium chloride infusion  500 mL Intravenous Once Charlie Pitter III, MD        ROS: Review of Systems  Constitutional: Negative for activity change, appetite change, chills, fatigue and unexpected weight change.  HENT: Negative for congestion, mouth sores and sinus pressure.   Eyes: Negative for visual disturbance.  Respiratory: Negative for cough and chest tightness.   Gastrointestinal: Negative for abdominal pain and nausea.  Genitourinary: Negative for difficulty urinating, frequency and vaginal pain.  Musculoskeletal: Negative for back pain and gait problem.  Skin: Negative for pallor and rash.  Neurological: Negative for dizziness, tremors, weakness, numbness and headaches.  Psychiatric/Behavioral:  Negative for confusion, sleep disturbance and suicidal ideas.    Objective:  BP 124/82 (BP Location: Left Arm, Patient Position: Sitting, Cuff Size: Normal)   Pulse 82   Temp 97.9 F (36.6 C) (Oral)   Ht 5\' 1"  (1.549 m)   Wt 156 lb (70.8 kg)   SpO2 97%   BMI 29.48 kg/m   BP Readings from Last 3 Encounters:  07/24/18 124/82  07/06/18 117/67  04/18/18 118/72    Wt Readings from Last 3 Encounters:  07/24/18 156 lb (70.8 kg)  07/05/18 158 lb (71.7 kg)  04/18/18 158 lb (71.7 kg)    Physical Exam Constitutional:      General: She is not in acute distress.    Appearance: She is well-developed.  HENT:     Head: Normocephalic.     Right Ear: External ear normal.     Left Ear:  External ear normal.     Nose: Nose normal.  Eyes:     General:        Right eye: No discharge.        Left eye: No discharge.     Conjunctiva/sclera: Conjunctivae normal.     Pupils: Pupils are equal, round, and reactive to light.  Neck:     Musculoskeletal: Normal range of motion and neck supple.     Thyroid: No thyromegaly.     Vascular: No JVD.     Trachea: No tracheal deviation.  Cardiovascular:     Rate and Rhythm: Normal rate and regular rhythm.     Heart sounds: Normal heart sounds.  Pulmonary:     Effort: No respiratory distress.     Breath sounds: No stridor. No wheezing.  Abdominal:     General: Bowel sounds are normal. There is no distension.     Palpations: Abdomen is soft. There is no mass.     Tenderness: There is no abdominal tenderness. There is no guarding or rebound.  Musculoskeletal:        General: No tenderness.  Lymphadenopathy:     Cervical: No cervical adenopathy.  Skin:    Findings: No erythema or rash.  Neurological:     Cranial Nerves: No cranial nerve deficit.     Motor: No abnormal muscle tone.     Coordination: Coordination normal.     Deep Tendon Reflexes: Reflexes normal.  Psychiatric:        Behavior: Behavior normal.        Thought Content: Thought content normal.        Judgment: Judgment normal.     Lab Results  Component Value Date   WBC 14.2 (H) 07/05/2018   HGB 12.3 07/05/2018   HCT 39.3 07/05/2018   PLT 361 07/05/2018   GLUCOSE 120 (H) 07/05/2018   CHOL 166 09/14/2015   TRIG 93 09/14/2015   HDL 45 09/14/2015   LDLDIRECT 137.5 03/17/2009   LDLCALC 102 (H) 09/14/2015   ALT 15 07/05/2018   AST 25 07/05/2018   NA 138 07/05/2018   K 4.0 07/05/2018   CL 107 07/05/2018   CREATININE 1.16 (H) 07/05/2018   BUN 25 (H) 07/05/2018   CO2 18 (L) 07/05/2018   TSH 2.54 08/31/2016   INR 0.99 03/03/2016   HGBA1C 6.1 10/12/2016    Ct Abdomen Pelvis W Contrast  Result Date: 07/06/2018 CLINICAL DATA:  Mid to lower abdominal pain.  No relief from medications. Elevated white cells. EXAM: CT ABDOMEN AND PELVIS WITH CONTRAST TECHNIQUE: Multidetector CT imaging of the abdomen  and pelvis was performed using the standard protocol following bolus administration of intravenous contrast. CONTRAST:  80mL ISOVUE-300 IOPAMIDOL (ISOVUE-300) INJECTION 61% COMPARISON:  04/05/2017 FINDINGS: Lower chest: Focal infiltration in the right lung base is unchanged since previous study, possibly indicating scarring. Postoperative changes at the EG junction consistent with fundoplication. Visualized distal esophagus appears mildly dilated and fluid-filled. This could indicate dysmotility, reflux, or distal esophageal stricture. Hepatobiliary: No focal liver abnormality is seen. No gallstones, gallbladder wall thickening, or biliary dilatation. Pancreas: Unremarkable. No pancreatic ductal dilatation or surrounding inflammatory changes. Spleen: Normal in size without focal abnormality. Adrenals/Urinary Tract: No adrenal gland nodules. Cyst in the upper pole right kidney without change. No hydronephrosis or hydroureter. Nephrograms are symmetrical. Bladder is decompressed. Stomach/Bowel: Stomach, small bowel, and colon are not abnormally distended. Prominent colonic diverticulosis without obvious diverticulitis. No abscess. Mild diffuse sigmoid colon wall thickening probably relates to muscular hypertrophy. Appendix is prominent but is mostly air-filled to the distal tip, similar to previous study. No definite inflammatory changes period. Vascular/Lymphatic: Aortic atherosclerosis. No enlarged abdominal or pelvic lymph nodes. Reproductive: Status post hysterectomy. No adnexal masses. Other: No free air or free fluid in the abdomen. Small periumbilical hernia containing fat. Small ventral anterior abdominal wall hernia below the umbilicus containing fat. No evidence of fat necrosis. Musculoskeletal: Mild degenerative changes in the spine. No destructive bone lesions.  IMPRESSION: 1. No acute process demonstrated in the abdomen or pelvis. No evidence of bowel obstruction. Prominent colonic diverticulosis without evidence of diverticulitis. Appendix is prominent but is mostly air-filled to the distal tip, similar to previous study. No definite inflammatory changes to suggest appendicitis. 2. Postoperative fundoplication with mild fluid distention of the distal esophagus. 3. Aortic atherosclerosis. Electronically Signed   By: Burman Nieves M.D.   On: 07/06/2018 00:51    Assessment & Plan:   There are no diagnoses linked to this encounter.   No orders of the defined types were placed in this encounter.    Follow-up: No follow-ups on file.  [name], MD

## 2018-07-24 NOTE — Assessment & Plan Note (Signed)
S/p ER visit for abd pain 07/05/18 - CT abd was OK. Fam h/o GB disease. C/o nausea - ?meds related Appt w/Dr Loletha Carrow pending

## 2018-08-02 ENCOUNTER — Ambulatory Visit: Payer: Medicare Other | Admitting: Gastroenterology

## 2018-08-03 DIAGNOSIS — J040 Acute laryngitis: Secondary | ICD-10-CM | POA: Diagnosis not present

## 2018-08-03 DIAGNOSIS — J320 Chronic maxillary sinusitis: Secondary | ICD-10-CM | POA: Diagnosis not present

## 2018-08-03 DIAGNOSIS — R05 Cough: Secondary | ICD-10-CM | POA: Diagnosis not present

## 2018-08-03 DIAGNOSIS — J322 Chronic ethmoidal sinusitis: Secondary | ICD-10-CM | POA: Diagnosis not present

## 2018-08-07 DIAGNOSIS — M5440 Lumbago with sciatica, unspecified side: Secondary | ICD-10-CM | POA: Diagnosis not present

## 2018-08-07 DIAGNOSIS — M25551 Pain in right hip: Secondary | ICD-10-CM | POA: Diagnosis not present

## 2018-08-07 DIAGNOSIS — M461 Sacroiliitis, not elsewhere classified: Secondary | ICD-10-CM | POA: Diagnosis not present

## 2018-08-07 DIAGNOSIS — M419 Scoliosis, unspecified: Secondary | ICD-10-CM | POA: Diagnosis not present

## 2018-08-15 ENCOUNTER — Ambulatory Visit: Payer: Medicare Other | Admitting: Gastroenterology

## 2018-08-15 ENCOUNTER — Encounter: Payer: Self-pay | Admitting: Gastroenterology

## 2018-08-15 VITALS — BP 100/60 | HR 84 | Ht 61.0 in | Wt 159.4 lb

## 2018-08-15 DIAGNOSIS — K5900 Constipation, unspecified: Secondary | ICD-10-CM | POA: Diagnosis not present

## 2018-08-15 DIAGNOSIS — R1030 Lower abdominal pain, unspecified: Secondary | ICD-10-CM | POA: Diagnosis not present

## 2018-08-15 NOTE — Patient Instructions (Signed)
If you are age 74 or older, your body mass index should be between 23-30. Your Body mass index is 30.12 kg/m. If this is out of the aforementioned range listed, please consider follow up with your Primary Care Provider.  If you are age 42 or younger, your body mass index should be between 19-25. Your Body mass index is 30.12 kg/m. If this is out of the aformentioned range listed, please consider follow up with your Primary Care Provider.   It was a pleasure to see you today!  Dr. Loletha Carrow

## 2018-08-15 NOTE — Progress Notes (Signed)
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 At last office visit in April 2019, she also had altered bowel habits and abdominal pain for many years that I characterized as follows: "Cindy Simpson is a limited historian, and I had some difficulty characterizing her symptoms. She describes over 10 years of alternating bowel habits, though it sounds like primarily constipation.  She may go up to a week without a bowel movement, then takes perhaps a teaspoon of MiraLAX and then has a large amount of loose stool.  She has occasional bleeding, as recently as just a few days ago.  Her primary care reportedly gave her Linzess 72 mcg, but she says just the dose or 2 caused diarrhea and she could not take it.  Her last colonoscopy in 2002 with Dr. Sharlett Iles revealed diverticulosis and no colon polyps. [name] describes generalized bandlike lower abdominal pain that does not have any clear relation to position, time of day, eating or bowel movements."  Colonoscopy April 2019 was a difficult exam due to significant diverticulosis, tortuosity and redundancy and poor preparation.  A cecal polyp was removed piecemeal, pathology adenomatous and recommendation for recall exam in 1 year.  She was in the emergency department last month with acute onset lower abdominal pain that occurred after eating chili that evening.  There was initial concern for diverticulitis, but none seen on CT scan.  She improved with IV fluids and antiemetics over a period of observation that evening.   She is back to her baseline of chronic constipation, with a BM about 3 times a week.  She takes a little MiraLAX perhaps twice a week, just enough to cover the bottom of the cap. She has generalized lower abdominal pressure  and bloating and has been taking probiotics hoping they would help her constipation. No rectal bleeding, appetite good and weight reportedly stable. ROS: Cardiovascular:  no chest pain Respiratory: no dyspnea Lower back pain recently.  The patient's Past Medical, Family and Social History were reviewed and are on file in the EMR.  Objective:  Med list reviewed  Current Outpatient Medications:  .  aspirin EC 81 MG EC tablet, Take 1 tablet (81 mg total) by mouth daily., Disp: 30 tablet, Rfl: 3 .  bismuth subsalicylate (PEPTO BISMOL) 262 MG/15ML suspension, Take 30 mLs by mouth every 6 (six) hours as needed for indigestion., Disp: , Rfl:  .  carvedilol (COREG) 6.25 MG tablet, Take 6.25 mg by mouth 2 (two) times daily with a meal. , Disp: , Rfl:  .  cetirizine (ZYRTEC) 10 MG tablet, Take 10 mg by mouth at bedtime., Disp: , Rfl:  .  Cholecalciferol 1000 UNITS tablet, Take 1,000 Units by mouth daily after supper. , Disp: , Rfl:  .  glucose blood (ACCU-CHEK AVIVA) test strip, Use to check blood sugars twice a day, Disp: 100 each, Rfl: 5 .  Lancets (ACCU-CHEK SOFT TOUCH) lancets, Use to check blood sugars twice a day, Disp: 100 each, Rfl: 5 .  levothyroxine (SYNTHROID, LEVOTHROID) 75 MCG tablet, TAKE 1 TABLET BY MOUTH  DAILY, Disp: 90 tablet, Rfl: 3 .  lovastatin (MEVACOR) 10 MG tablet, TAKE 1 TABLET BY MOUTH  DAILY, Disp: 90 tablet, Rfl: 2 .  Multiple Vitamin (MULTIVITAMIN WITH MINERALS) TABS tablet, Take 1 tablet by mouth every evening. ,  Disp: , Rfl:  .  pantoprazole (PROTONIX) 40 MG tablet, TAKE 1 TABLET BY MOUTH TWO  TIMES DAILY, Disp: 180 tablet, Rfl: 3 .  Probiotic CAPS, Take 1 capsule by mouth 2 (two) times daily., Disp: , Rfl:  .  TURMERIC PO, Take 1 tablet by mouth daily. , Disp: , Rfl:  .  valsartan (DIOVAN) 80 MG tablet, Take 1 tablet (80 mg total) by mouth daily., Disp: 90 tablet, Rfl: 3   Vital signs in last 24 hrs: Vitals:   08/15/18 1048  BP: 100/60  Pulse: 84    Physical  Exam  She is well-appearing  HEENT: sclera anicteric, oral mucosa moist without lesions  Neck: supple, no thyromegaly, JVD or lymphadenopathy  Cardiac: RRR without murmurs, S1S2 heard, no peripheral edema  Pulm: clear to auscultation bilaterally, normal RR and effort noted  Abdomen: soft, no tenderness, with active bowel sounds. No guarding or palpable hepatosplenomegaly.  Skin; warm and dry, no jaundice or rash  Recent Labs:  CBC Latest Ref Rng & Units 07/05/2018 04/18/2018 04/05/2017  WBC 4.0 - 10.5 K/uL 14.2(H) 6.2 29.8(H)  Hemoglobin 12.0 - 15.0 g/dL 12.3 11.8(L) 12.3  Hematocrit 36.0 - 46.0 % 39.3 34.9(L) 36.8  Platelets 150 - 400 K/uL 361 312.0 375   CMP Latest Ref Rng & Units 07/05/2018 04/18/2018 09/15/2017  Glucose 70 - 99 mg/dL 120(H) 88 89  BUN 8 - 23 mg/dL 25(H) 22 22  Creatinine 0.44 - 1.00 mg/dL 1.16(H) 1.09 0.87  Sodium 135 - 145 mmol/L 138 138 140  Potassium 3.5 - 5.1 mmol/L 4.0 4.1 4.1  Chloride 98 - 111 mmol/L 107 102 104  CO2 22 - 32 mmol/L 18(L) 28 28  Calcium 8.9 - 10.3 mg/dL 9.6 9.2 9.5  Total Protein 6.5 - 8.1 g/dL 7.8 - -  Total Bilirubin 0.3 - 1.2 mg/dL 0.4 - -  Alkaline Phos 38 - 126 U/L 91 - -  AST 15 - 41 U/L 25 - -  ALT 0 - 44 U/L 15 - -     Radiologic studies:  CT abdomen and pelvis 07/05/2018 impression: IMPRESSION: 1. No acute process demonstrated in the abdomen or pelvis. No evidence of bowel obstruction. Prominent colonic diverticulosis without evidence of diverticulitis. Appendix is prominent but is mostly air-filled to the distal tip, similar to previous study. No definite inflammatory changes to suggest appendicitis. 2. Postoperative fundoplication with mild fluid distention of the distal esophagus. 3. Aortic atherosclerosis.    @ASSESSMENTPLANBEGIN @ Assessment: Encounter Diagnoses  Name Primary?  . Constipation, unspecified constipation type Yes  . Lower abdominal pain     Chronic constipation with intermittent  diarrhea.  Her severe diverticulosis with tortuosity is contributing to the constipation.  She had poor preparation on last colonoscopy polyp removed piecemeal, which is why she is recalled short interval.  Procedure will be scheduled when name comes up for recall in a couple months.  No evidence diverticulitis or mass or high-grade obstruction seen on recent CT scan.  She is very sensitive to stool softeners and laxatives.  I recommended that twice a week she take a higher dose of MiraLAX for a planned catharsis and hopefully some relief of symptoms.  I also recommended she stop probiotics because that may be contributing to bloating and gas, and are unlikely to be helping her constipation.  Total time 20 minutes, over half spent face-to-face with patient in counseling and coordination of care.   [name]

## 2018-09-25 DIAGNOSIS — I2510 Atherosclerotic heart disease of native coronary artery without angina pectoris: Secondary | ICD-10-CM | POA: Diagnosis not present

## 2018-09-25 DIAGNOSIS — I252 Old myocardial infarction: Secondary | ICD-10-CM | POA: Diagnosis not present

## 2018-09-25 DIAGNOSIS — E785 Hyperlipidemia, unspecified: Secondary | ICD-10-CM | POA: Diagnosis not present

## 2018-09-25 DIAGNOSIS — I10 Essential (primary) hypertension: Secondary | ICD-10-CM | POA: Diagnosis not present

## 2018-09-25 DIAGNOSIS — I5181 Takotsubo syndrome: Secondary | ICD-10-CM | POA: Diagnosis not present

## 2018-10-19 ENCOUNTER — Other Ambulatory Visit: Payer: Self-pay

## 2018-10-19 MED ORDER — PANTOPRAZOLE SODIUM 40 MG PO TBEC: 40.0000 mg | DELAYED_RELEASE_TABLET | Freq: Two times a day (BID) | ORAL | 3 refills | Status: DC

## 2018-10-19 MED ORDER — CARVEDILOL 6.25 MG PO TABS: 6.2500 mg | ORAL_TABLET | Freq: Two times a day (BID) | ORAL | 3 refills | Status: DC

## 2018-10-19 NOTE — Telephone Encounter (Signed)
recieved refill request form optum, you have not filled RX in past. Please advise

## 2018-11-24 ENCOUNTER — Encounter: Payer: Self-pay | Admitting: Gastroenterology

## 2018-12-20 ENCOUNTER — Encounter: Payer: Self-pay | Admitting: Gastroenterology

## 2018-12-26 ENCOUNTER — Encounter: Payer: Self-pay | Admitting: Podiatry

## 2018-12-26 ENCOUNTER — Ambulatory Visit: Payer: Medicare Other | Admitting: Podiatry

## 2018-12-26 ENCOUNTER — Other Ambulatory Visit: Payer: Self-pay

## 2018-12-26 ENCOUNTER — Telehealth: Payer: Self-pay | Admitting: Internal Medicine

## 2018-12-26 ENCOUNTER — Ambulatory Visit (INDEPENDENT_AMBULATORY_CARE_PROVIDER_SITE_OTHER): Payer: Medicare Other

## 2018-12-26 DIAGNOSIS — M2042 Other hammer toe(s) (acquired), left foot: Secondary | ICD-10-CM | POA: Diagnosis not present

## 2018-12-26 DIAGNOSIS — M2041 Other hammer toe(s) (acquired), right foot: Secondary | ICD-10-CM | POA: Diagnosis not present

## 2018-12-26 DIAGNOSIS — E1149 Type 2 diabetes mellitus with other diabetic neurological complication: Secondary | ICD-10-CM

## 2018-12-26 DIAGNOSIS — Q828 Other specified congenital malformations of skin: Secondary | ICD-10-CM | POA: Diagnosis not present

## 2018-12-26 MED ORDER — GABAPENTIN 100 MG PO CAPS: 100.0000 mg | ORAL_CAPSULE | Freq: Every day | ORAL | 0 refills | Status: DC

## 2018-12-26 NOTE — Telephone Encounter (Signed)
Copied from [place] [phone]. Topic: General - Inquiry >> Dec 26, 2018 12:42 PM Richardo Priest, NT wrote: Reason for CRM: Iona Beard from [facility] is calling in to see if the PCP will review with the patient again in regards to her cardiovascular issues. Calling to see if they can start a medication as suggested by the American heart association. If call back is needed please call [phone]. States a fax will be sent as well to discuss with more information.

## 2018-12-26 NOTE — Patient Instructions (Signed)
Gabapentin capsules or tablets What is this medicine? GABAPENTIN (GA ba pen tin) is used to control seizures in certain types of epilepsy. It is also used to treat certain types of nerve pain. This medicine may be used for other purposes; ask your health care provider or pharmacist if you have questions. COMMON BRAND NAME(S): Active-PAC with Gabapentin, Gabarone, Neurontin What should I tell my health care provider before I take this medicine? They need to know if you have any of these conditions: -kidney disease -suicidal thoughts, plans, or attempt; a previous suicide attempt by you or a family member -an unusual or allergic reaction to gabapentin, other medicines, foods, dyes, or preservatives -pregnant or trying to get pregnant -breast-feeding How should I use this medicine? Take this medicine by mouth with a glass of water. Follow the directions on the prescription label. You can take it with or without food. If it upsets your stomach, take it with food. Take your medicine at regular intervals. Do not take it more often than directed. Do not stop taking except on your doctor's advice. If you are directed to break the 600 or 800 mg tablets in half as part of your dose, the extra half tablet should be used for the next dose. If you have not used the extra half tablet within 28 days, it should be thrown away. A special MedGuide will be given to you by the pharmacist with each prescription and refill. Be sure to read this information carefully each time. Talk to your pediatrician regarding the use of this medicine in children. While this drug may be prescribed for children as young as 3 years for selected conditions, precautions do apply. Overdosage: If you think you have taken too much of this medicine contact a poison control center or emergency room at once. NOTE: This medicine is only for you. Do not share this medicine with others. What if I miss a dose? If you miss a dose, take it as soon  as you can. If it is almost time for your next dose, take only that dose. Do not take double or extra doses. What may interact with this medicine? Do not take this medicine with any of the following medications: -other gabapentin products This medicine may also interact with the following medications: -alcohol -antacids -antihistamines for allergy, cough and cold -certain medicines for anxiety or sleep -certain medicines for depression or psychotic disturbances -homatropine; hydrocodone -naproxen -narcotic medicines (opiates) for pain -phenothiazines like chlorpromazine, mesoridazine, prochlorperazine, thioridazine This list may not describe all possible interactions. Give your health care provider a list of all the medicines, herbs, non-prescription drugs, or dietary supplements you use. Also tell them if you smoke, drink alcohol, or use illegal drugs. Some items may interact with your medicine. What should I watch for while using this medicine? Visit your doctor or health care professional for regular checks on your progress. You may want to keep a record at home of how you feel your condition is responding to treatment. You may want to share this information with your doctor or health care professional at each visit. You should contact your doctor or health care professional if your seizures get worse or if you have any new types of seizures. Do not stop taking this medicine or any of your seizure medicines unless instructed by your doctor or health care professional. Stopping your medicine suddenly can increase your seizures or their severity. Wear a medical identification bracelet or chain if you are taking this medicine for   seizures, and carry a card that lists all your medications. You may get drowsy, dizzy, or have blurred vision. Do not drive, use machinery, or do anything that needs mental alertness until you know how this medicine affects you. To reduce dizzy or fainting spells, do not  sit or stand up quickly, especially if you are an older patient. Alcohol can increase drowsiness and dizziness. Avoid alcoholic drinks. Your mouth may get dry. Chewing sugarless gum or sucking hard candy, and drinking plenty of water will help. The use of this medicine may increase the chance of suicidal thoughts or actions. Pay special attention to how you are responding while on this medicine. Any worsening of mood, or thoughts of suicide or dying should be reported to your health care professional right away. Women who become pregnant while using this medicine may enroll in the [place] Antiepileptic Drug Pregnancy Registry by calling 1-888-233-2334. This registry collects information about the safety of antiepileptic drug use during pregnancy. What side effects may I notice from receiving this medicine? Side effects that you should report to your doctor or health care professional as soon as possible: -allergic reactions like skin rash, itching or hives, swelling of the face, lips, or tongue -worsening of mood, thoughts or actions of suicide or dying Side effects that usually do not require medical attention (report to your doctor or health care professional if they continue or are bothersome): -constipation -difficulty walking or controlling muscle movements -dizziness -nausea -slurred speech -tiredness -tremors -weight gain This list may not describe all possible side effects. Call your doctor for medical advice about side effects. You may report side effects to FDA at 1-800-FDA-1088. Where should I keep my medicine? Keep out of reach of children. This medicine may cause accidental overdose and death if it taken by other adults, children, or pets. Mix any unused medicine with a substance like cat litter or coffee grounds. Then throw the medicine away in a sealed container like a sealed bag or a coffee can with a lid. Do not use the medicine after the expiration date. Store at room  temperature between 15 and 30 degrees C (59 and 86 degrees F). NOTE: This sheet is a summary. It may not cover all possible information. If you have questions about this medicine, talk to your doctor, pharmacist, or health care provider.  2019 Elsevier/Gold Standard (2017-12-08 13:21:44)  

## 2018-12-26 NOTE — Telephone Encounter (Signed)
Pt is on medication as provider sees necessary.

## 2018-12-29 ENCOUNTER — Other Ambulatory Visit: Payer: Self-pay | Admitting: Internal Medicine

## 2018-12-31 NOTE — Progress Notes (Signed)
Subjective:   Patient ID: Cindy Simpson, female   DOB: 73 y.o.   MRN: [id number]   HPI 73 year old female presents the office with concerns of pain in her feet mostly at nighttime.  She said that when she is lying in bed she feels a funny sensation to her feet and almost like her skin is crawling.  She states that when she sticks her feet out of the covers they feel better.  She describes numbness and tingling to her feet at nighttime as well.  She denies any cramps in her legs when she walks.  She gets pain along the area on her fifth toe on the area of the callus as well as the fourth toe.  Denies any recent injury.   Review of Systems  All other systems reviewed and are negative.  Past Medical History:  Diagnosis Date  . Allergic rhinitis   . Anemia   . Asthmatic bronchitis 2009   allergies; post nasal drip but not allergies  . GERD (gastroesophageal reflux disease)   . History of hiatal hernia   . Hyperlipidemia   . Hypothyroidism   . IBS (irritable bowel syndrome)   . Insomnia   . Irregular heart beat Not sure; long time ago   Periodic irregular heart rate; take verapamil.  . Status post dilation of esophageal narrowing   . Torticollis 2006   Estimate date; rec'd botox and resolved    Past Surgical History:  Procedure Laterality Date  . ABDOMINAL HYSTERECTOMY     with ovaries removed also.  Marland Kitchen CARDIAC CATHETERIZATION N/A 09/13/2015   Procedure: Left Heart Cath and Coronary Angiography;  Surgeon: Charolette Forward, MD;  Location: [facility];  Service: Cardiovascular;  Laterality: N/A;  . CATARACT EXTRACTION Bilateral 2014   approx 2 years ago  . CLEFT PALATE REPAIR     child  . ESOPHAGOGASTRODUODENOSCOPY (EGD) WITH PROPOFOL N/A 12/30/2015   Procedure: ESOPHAGOGASTRODUODENOSCOPY (EGD) WITH PROPOFOL;  Surgeon: Doran Stabler, MD;  Location: [facility];  Service: Gastroenterology;  Laterality: N/A;  . HIATAL HERNIA REPAIR N/A 03/09/2016   Procedure: LAPAROSCOPIC  REPAIR OF HIATAL HERNIA  NISSEN FUNDOPLICATION UPPER ENDOSCOPY;  Surgeon: Jackolyn Confer, MD;  Location: [facility];  Service: General;  Laterality: N/A;  . NISSEN FUNDOPLICATION N/A 7/37/1062   Procedure: NISSEN FUNDOPLICATION;  Surgeon: Jackolyn Confer, MD;  Location: [facility];  Service: General;  Laterality: N/A;  . TUBAL LIGATION Bilateral 1985  . UPPER GI ENDOSCOPY  03/09/2016   Procedure: UPPER GI ENDOSCOPY;  Surgeon: Jackolyn Confer, MD;  Location: [facility];  Service: General;;     Current Outpatient Medications:  .  aspirin EC 81 MG EC tablet, Take 1 tablet (81 mg total) by mouth daily., Disp: 30 tablet, Rfl: 3 .  bismuth subsalicylate (PEPTO BISMOL) 262 MG/15ML suspension, Take 30 mLs by mouth every 6 (six) hours as needed for indigestion., Disp: , Rfl:  .  carvedilol (COREG) 6.25 MG tablet, Take 1 tablet (6.25 mg total) by mouth 2 (two) times daily with a meal., Disp: 180 tablet, Rfl: 3 .  cetirizine (ZYRTEC) 10 MG tablet, Take 10 mg by mouth at bedtime., Disp: , Rfl:  .  Cholecalciferol 1000 UNITS tablet, Take 1,000 Units by mouth daily after supper. , Disp: , Rfl:  .  gabapentin (NEURONTIN) 100 MG capsule, Take 1 capsule (100 mg total) by mouth at bedtime., Disp: 90 capsule, Rfl: 0 .  glucose blood (ACCU-CHEK AVIVA) test strip, Use to check blood sugars  twice a day, Disp: 100 each, Rfl: 5 .  Lancets (ACCU-CHEK SOFT TOUCH) lancets, Use to check blood sugars twice a day, Disp: 100 each, Rfl: 5 .  levothyroxine (SYNTHROID, LEVOTHROID) 75 MCG tablet, TAKE 1 TABLET BY MOUTH  DAILY, Disp: 90 tablet, Rfl: 3 .  lovastatin (MEVACOR) 10 MG tablet, TAKE 1 TABLET BY MOUTH  DAILY, Disp: 90 tablet, Rfl: 2 .  Multiple Vitamin (MULTIVITAMIN WITH MINERALS) TABS tablet, Take 1 tablet by mouth every evening. , Disp: , Rfl:  .  pantoprazole (PROTONIX) 40 MG tablet, Take 1 tablet (40 mg total) by mouth 2 (two) times daily., Disp: 180 tablet, Rfl: 3 .  TURMERIC PO, Take 1 tablet by mouth daily. , Disp: , Rfl:   .  valsartan (DIOVAN) 80 MG tablet, TAKE 1 TABLET BY MOUTH EVERY DAY, Disp: 90 tablet, Rfl: 3  Allergies  Allergen Reactions  . Lisinopril Cough  . Vinegar [Acetic Acid] Anaphylaxis, Shortness Of Breath and Swelling    Throat closes trouble breathing with vinegar.  . Metoclopramide Hcl Other (See Comments)    Neck spasms          Objective:  Physical Exam  General: AAO x3, NAD  Dermatological: Hyperkeratotic lesion on right fifth metatarsal head.  No ongoing ulceration drainage or any signs of infection.  Minimal on the left side.  No open lesions.  Vascular: Dorsalis Pedis artery and Posterior Tibial artery pedal pulses are 2/4 bilateral with immedate capillary fill time.  There is no pain with calf compression, swelling, warmth, erythema.   Neruologic: Sensation mildly decreased with Semmes Weinstein monofilament to the plantar aspect of the feet.  Musculoskeletal: There is no area pinpoint tenderness.  There is no significant edema, erythema.  Muscular strength 5/5 in all groups tested bilateral.  Hammertoes present  Gait: Unassisted, Nonantalgic.       Assessment:   73 year old female with neuropathy symptoms, hyperkeratotic lesions    Plan:  -Treatment options discussed including all alternatives, risks, and complications -Etiology of symptoms were discussed -X-rays were obtained and reviewed with the patient.  There is no evidence of acute fracture or stress fracture. -We discussed treatment options for neuropathy.  After discussion she wants to try medication.  I prescribed gabapentin 100 mg at nighttime.  Discussed side effects of medication.  Discussed we can titrate up based on how she is doing. -Hyperkeratotic lesion sharply debrided without any complications or bleeding.  Moisturizer daily.  Return in about 6 weeks (around 02/06/2019).  Neuropathy check  Trula Slade DPM

## 2019-01-09 ENCOUNTER — Other Ambulatory Visit: Payer: Self-pay

## 2019-01-09 ENCOUNTER — Ambulatory Visit: Payer: Medicare Other | Admitting: *Deleted

## 2019-01-09 VITALS — Ht 62.0 in | Wt 154.0 lb

## 2019-01-09 DIAGNOSIS — Z86010 Personal history of colonic polyps: Secondary | ICD-10-CM

## 2019-01-09 MED ORDER — SUPREP BOWEL PREP KIT 17.5-3.13-1.6 GM/177ML PO SOLN: 1.0000 | Freq: Once | ORAL | 0 refills | Status: AC

## 2019-01-09 NOTE — Progress Notes (Signed)
Previsit via telephone, ID per name,dob and address No egg or soy allergy known to patient  No issues with past sedation with any surgeries  or procedures, no intubation problems  No diet pills per patient No home 02 use per patient  No blood thinners per patient  Pt has constipation No A fib or A flutter  EMMI  Information in packet

## 2019-01-22 ENCOUNTER — Ambulatory Visit: Payer: Medicare Other | Admitting: Internal Medicine

## 2019-01-22 ENCOUNTER — Telehealth: Payer: Self-pay | Admitting: Gastroenterology

## 2019-01-22 NOTE — Telephone Encounter (Signed)

## 2019-01-23 ENCOUNTER — Ambulatory Visit (AMBULATORY_SURGERY_CENTER): Payer: Medicare Other | Admitting: Gastroenterology

## 2019-01-23 ENCOUNTER — Encounter: Payer: Self-pay | Admitting: Gastroenterology

## 2019-01-23 ENCOUNTER — Other Ambulatory Visit: Payer: Self-pay

## 2019-01-23 VITALS — BP 127/47 | HR 65 | Temp 98.6°F | Resp 18 | Ht 61.0 in | Wt 155.0 lb

## 2019-01-23 DIAGNOSIS — Z86010 Personal history of colonic polyps: Secondary | ICD-10-CM | POA: Diagnosis not present

## 2019-01-23 DIAGNOSIS — Z1211 Encounter for screening for malignant neoplasm of colon: Secondary | ICD-10-CM | POA: Diagnosis not present

## 2019-01-23 DIAGNOSIS — D124 Benign neoplasm of descending colon: Secondary | ICD-10-CM | POA: Diagnosis not present

## 2019-01-23 MED ORDER — SODIUM CHLORIDE 0.9 % IV SOLN: 500.0000 mL | Freq: Once | INTRAVENOUS | Status: DC

## 2019-01-23 NOTE — Progress Notes (Signed)
Called to room to assist during endoscopic procedure.  Patient ID and intended procedure confirmed with present staff. Received instructions for my participation in the procedure from the performing physician.  

## 2019-01-23 NOTE — Progress Notes (Signed)
Pt's states no medical or surgical changes since previsit or office visit. 

## 2019-01-23 NOTE — Op Note (Signed)
[facility] Patient Name: Cindy Simpson Procedure Date: 01/23/2019 7:37 AM MRN: [id number] Endoscopist: Sherilyn Cooter L. Myrtie Neither , MD Age: 73 Referring MD:  Date of Birth: Jun 05, 1946 Gender: Female Account #: [id number] Procedure:                Colonoscopy Indications:              Surveillance: History of piecemeal removal adenoma                            on last colonoscopy (< 3 yrs) - 12mm cecal adenoma                            ; piecemeal removal Medicines:                Monitored Anesthesia Care Procedure:                Pre-Anesthesia Assessment:                           - Prior to the procedure, a History and Physical                            was performed, and patient medications and                            allergies were reviewed. The patient's tolerance of                            previous anesthesia was also reviewed. The risks                            and benefits of the procedure and the sedation                            options and risks were discussed with the patient.                            All questions were answered, and informed consent                            was obtained. Prior Anticoagulants: The patient has                            taken no previous anticoagulant or antiplatelet                            agents except for aspirin. ASA Grade Assessment:                            III - A patient with severe systemic disease. After                            reviewing the risks and benefits, the patient was  deemed in satisfactory condition to undergo the                            procedure.                           After obtaining informed consent, the colonoscope                            was passed under direct vision. Throughout the                            procedure, the patient's blood pressure, pulse, and                            oxygen saturations were monitored continuously. The                             Model PCF-H190DL [id number]) scope was introduced                            through the anus and advanced to the the cecum,                            identified by appendiceal orifice and ileocecal                            valve. The colonoscopy was performed with                            difficulty due to multiple diverticula in the                            colon, significant looping and a tortuous colon.                            Successful completion of the procedure was aided by                            changing the patient to a supine position, using                            manual pressure and withdrawing the scope and                            replacing with the pediatric colonoscope. The                            patient tolerated the procedure well. The quality                            of the bowel preparation was good. The ileocecal  valve, appendiceal orifice, and rectum were                            photographed. Scope In: 7:46:51 AM Scope Out: 8:05:08 AM Scope Withdrawal Time: 0 hours 8 minutes 39 seconds  Total Procedure Duration: 0 hours 18 minutes 17 seconds  Findings:                 The perianal and digital rectal examinations were                            normal.                           The sigmoid colon was significantly tortuous.                           Multiple diverticula were found in the entire colon.                           A diminutive polyp was found in the descending                            colon. The polyp was sessile. The polyp was removed                            with a cold snare. Resection and retrieval were                            complete.                           The exam was otherwise without abnormality on                            direct and retroflexion views. Complications:            No immediate complications. Estimated Blood Loss:     Estimated blood loss was  minimal. Impression:               - Tortuous colon.                           - Diverticulosis in the entire examined colon.                           - One diminutive polyp in the descending colon,                            removed with a cold snare. Resected and retrieved.                           - The examination was otherwise normal on direct                            and retroflexion views. Recommendation:           -  Patient has a contact number available for                            emergencies. The signs and symptoms of potential                            delayed complications were discussed with the                            patient. Return to normal activities tomorrow.                            Written discharge instructions were provided to the                            patient.                           - Resume previous diet.                           - Continue present medications.                           - Await pathology results.                           - Based on current guidelines, and considering                            challenging anatomy, no repeat surveillance                            colonoscopy necessary. Henry L. Myrtie Neither, MD 01/23/2019 8:11:49 AM This report has been signed electronically.

## 2019-01-23 NOTE — Progress Notes (Signed)
To PACU, VSS. Report to [email or web address] 

## 2019-01-23 NOTE — Patient Instructions (Signed)
YOU HAD AN ENDOSCOPIC PROCEDURE TODAY AT [facility]:   Refer to the procedure report that was given to you for any specific questions about what was found during the examination.  If the procedure report does not answer your questions, please call your gastroenterologist to clarify.  If you requested that your care partner not be given the details of your procedure findings, then the procedure report has been included in a sealed envelope for you to review at your convenience later.  YOU SHOULD EXPECT: Some feelings of bloating in the abdomen. Passage of more gas than usual.  Walking can help get rid of the air that was put into your GI tract during the procedure and reduce the bloating. If you had a lower endoscopy (such as a colonoscopy or flexible sigmoidoscopy) you may notice spotting of blood in your stool or on the toilet paper. If you underwent a bowel prep for your procedure, you may not have a normal bowel movement for a few days.  Please Note:  You might notice some irritation and congestion in your nose or some drainage.  This is from the oxygen used during your procedure.  There is no need for concern and it should clear up in a day or so.  SYMPTOMS TO REPORT IMMEDIATELY:   Following lower endoscopy (colonoscopy or flexible sigmoidoscopy):  Excessive amounts of blood in the stool  Significant tenderness or worsening of abdominal pains  Swelling of the abdomen that is new, acute  Fever of 100F or higher  For urgent or emergent issues, a gastroenterologist can be reached at any hour by calling [phone].   DIET:  We do recommend a small meal at first, but then you may proceed to your regular diet.  Drink plenty of fluids but you should avoid alcoholic beverages for 24 hours.  ACTIVITY:  You should plan to take it easy for the rest of today and you should NOT DRIVE or use heavy machinery until tomorrow (because of the sedation medicines used during the test).     FOLLOW UP: Our staff will call the number listed on your records 48-72 hours following your procedure to check on you and address any questions or concerns that you may have regarding the information given to you following your procedure. If we do not reach you, we will leave a message.  We will attempt to reach you two times.  During this call, we will ask if you have developed any symptoms of COVID 19. If you develop any symptoms (ie: fever, flu-like symptoms, shortness of breath, cough etc.) before then, please call [phone].  If you test positive for Covid 19 in the 2 weeks post procedure, please call and report this information to [place].    If any biopsies were taken you will be contacted by phone or by letter within the next 1-3 weeks.  Please call us at [phone] if you have not heard about the biopsies in 3 weeks.   Await for biopsy results Polyps (handout given) Diverticulosis (handout given) No further Colonoscopy screening needed  SIGNATURES/CONFIDENTIALITY: You and/or your care partner have signed paperwork which will be entered into your electronic medical record.  These signatures attest to the fact that that the information above on your After Visit Summary has been reviewed and is understood.  Full responsibility of the confidentiality of this discharge information lies with you and/or your care-partner.

## 2019-01-24 ENCOUNTER — Ambulatory Visit (INDEPENDENT_AMBULATORY_CARE_PROVIDER_SITE_OTHER): Payer: Medicare Other | Admitting: Internal Medicine

## 2019-01-24 ENCOUNTER — Encounter: Payer: Self-pay | Admitting: Internal Medicine

## 2019-01-24 ENCOUNTER — Other Ambulatory Visit: Payer: Self-pay

## 2019-01-24 ENCOUNTER — Other Ambulatory Visit (INDEPENDENT_AMBULATORY_CARE_PROVIDER_SITE_OTHER): Payer: Medicare Other

## 2019-01-24 DIAGNOSIS — I10 Essential (primary) hypertension: Secondary | ICD-10-CM

## 2019-01-24 DIAGNOSIS — I2510 Atherosclerotic heart disease of native coronary artery without angina pectoris: Secondary | ICD-10-CM | POA: Diagnosis not present

## 2019-01-24 DIAGNOSIS — E785 Hyperlipidemia, unspecified: Secondary | ICD-10-CM

## 2019-01-24 DIAGNOSIS — E034 Atrophy of thyroid (acquired): Secondary | ICD-10-CM

## 2019-01-24 DIAGNOSIS — R739 Hyperglycemia, unspecified: Secondary | ICD-10-CM | POA: Diagnosis not present

## 2019-01-24 DIAGNOSIS — M6530 Trigger finger, unspecified finger: Secondary | ICD-10-CM | POA: Insufficient documentation

## 2019-01-24 DIAGNOSIS — K219 Gastro-esophageal reflux disease without esophagitis: Secondary | ICD-10-CM | POA: Diagnosis not present

## 2019-01-24 DIAGNOSIS — M65342 Trigger finger, left ring finger: Secondary | ICD-10-CM

## 2019-01-24 LAB — HEPATIC FUNCTION PANEL
ALT: 12 U/L (ref 0–35)
AST: 17 U/L (ref 0–37)
Albumin: 4 g/dL (ref 3.5–5.2)
Alkaline Phosphatase: 85 U/L (ref 39–117)
Bilirubin, Direct: 0.1 mg/dL (ref 0.0–0.3)
Total Bilirubin: 0.4 mg/dL (ref 0.2–1.2)
Total Protein: 6.5 g/dL (ref 6.0–8.3)

## 2019-01-24 LAB — BASIC METABOLIC PANEL
BUN: 15 mg/dL (ref 6–23)
CO2: 25 mEq/L (ref 19–32)
Calcium: 9 mg/dL (ref 8.4–10.5)
Chloride: 105 mEq/L (ref 96–112)
Creatinine, Ser: 0.99 mg/dL (ref 0.40–1.20)
GFR: 54.9 mL/min — ABNORMAL LOW (ref >60.00)
Glucose, Bld: 93 mg/dL (ref 70–99)
Potassium: 4.2 mEq/L (ref 3.5–5.1)
Sodium: 139 mEq/L (ref 135–145)

## 2019-01-24 LAB — CBC WITH DIFFERENTIAL/PLATELET
Basophils Absolute: 0.1 10*3/uL (ref 0.0–0.1)
Basophils Relative: 1.2 % (ref 0.0–3.0)
Eosinophils Absolute: 0.2 10*3/uL (ref 0.0–0.7)
Eosinophils Relative: 3 % (ref 0.0–5.0)
HCT: 35.2 % — ABNORMAL LOW (ref 36.0–46.0)
Hemoglobin: 11.9 g/dL — ABNORMAL LOW (ref 12.0–15.0)
Lymphocytes Relative: 29.7 % (ref 12.0–46.0)
Lymphs Abs: 1.9 10*3/uL (ref 0.7–4.0)
MCHC: 33.9 g/dL (ref 30.0–36.0)
MCV: 91.8 fl (ref 78.0–100.0)
Monocytes Absolute: 0.5 10*3/uL (ref 0.1–1.0)
Monocytes Relative: 8.8 % (ref 3.0–12.0)
Neutro Abs: 3.6 10*3/uL (ref 1.4–7.7)
Neutrophils Relative %: 57.3 % (ref 43.0–77.0)
Platelets: 301 10*3/uL (ref 150.0–400.0)
RBC: 3.84 Mil/uL — ABNORMAL LOW (ref 3.87–5.11)
RDW: 13.8 % (ref 11.5–15.5)
WBC: 6.3 10*3/uL (ref 4.0–10.5)

## 2019-01-24 LAB — LIPID PANEL
Cholesterol: 186 mg/dL (ref 0–200)
HDL: 55.1 mg/dL (ref >39.00)
LDL Cholesterol: 106 mg/dL — ABNORMAL HIGH (ref 0–99)
NonHDL: 131.17
Total CHOL/HDL Ratio: 3
Triglycerides: 125 mg/dL (ref 0.0–149.0)
VLDL: 25 mg/dL (ref 0.0–40.0)

## 2019-01-24 LAB — HEMOGLOBIN A1C: Hgb A1c MFr Bld: 6 % (ref 4.6–6.5)

## 2019-01-24 LAB — TSH: TSH: 4.26 u[IU]/mL (ref 0.35–4.50)

## 2019-01-24 MED ORDER — ASPIRIN 325 MG PO TABS: 162.0000 mg | ORAL_TABLET | Freq: Every day | ORAL | 3 refills | Status: DC

## 2019-01-24 MED ORDER — ASPIRIN 325 MG PO TABS: 325.0000 mg | ORAL_TABLET | Freq: Every day | ORAL | 3 refills | Status: DC

## 2019-01-24 MED ORDER — DICLOFENAC SODIUM 1 % TD GEL: 2.0000 g | Freq: Four times a day (QID) | TRANSDERMAL | 3 refills | Status: AC

## 2019-01-24 NOTE — Assessment & Plan Note (Signed)
Labs

## 2019-01-24 NOTE — Assessment & Plan Note (Signed)
Labs Levothroid 

## 2019-01-24 NOTE — Assessment & Plan Note (Signed)
Coreg, Diovan Labs

## 2019-01-24 NOTE — Assessment & Plan Note (Signed)
2017 Cardiac Cath:  Prox LAD to Mid LAD lesion, 15% stenosed.  There is mild to moderate left ventricular systolic dysfunction.  Lovastatin

## 2019-01-24 NOTE — Assessment & Plan Note (Signed)
Protonix.  ?

## 2019-01-24 NOTE — Patient Instructions (Addendum)
If you have medicare related insurance (such as traditional Medicare, [employer], [employer], or similar), Please make an appointment at the scheduling desk with Sharee Pimple, the [employer], for your Wellness visit in this office, which is a benefit with your insurance.   Trigger Finger  Trigger finger (stenosing tenosynovitis) is a condition that causes a finger to get stuck in a bent position. Each finger has a tough, cord-like tissue that connects muscle to bone (tendon), and each tendon is surrounded by a tunnel of tissue (tendon sheath). To move your finger, your tendon needs to slide freely through the sheath. Trigger finger happens when the tendon or the sheath thickens, making it difficult to move your finger. Trigger finger can affect any finger or a thumb. It may affect more than one finger. Mild cases may clear up with rest and medicine. Severe cases require more treatment. What are the causes? Trigger finger is caused by a thickened finger tendon or tendon sheath. The cause of this thickening is not known. What increases the risk? The following factors may make you more likely to develop this condition:  Doing activities that require a strong grip.  Having rheumatoid arthritis, gout, or diabetes.  Being 61-83 years old.  Being a woman. What are the signs or symptoms? Symptoms of this condition include:  Pain when bending or straightening your finger.  Tenderness or swelling where your finger attaches to the palm of your hand.  A lump in the palm of your hand or on the inside of your finger.  Hearing a popping sound when you try to straighten your finger.  Feeling a popping, catching, or locking sensation when you try to straighten your finger.  Being unable to straighten your finger. How is this diagnosed? This condition is diagnosed based on your symptoms and a physical exam. How is this treated? This condition may be treated  by:  Resting your finger and avoiding activities that make symptoms worse.  Wearing a finger splint to keep your finger in a slightly bent position.  Taking NSAIDs to relieve pain and swelling.  Injecting medicine (steroids) into the tendon sheath to reduce swelling and irritation. Injections may need to be repeated.  Having surgery to open the tendon sheath. This may be done if other treatments do not work and you cannot straighten your finger. You may need physical therapy after surgery. Follow these instructions at home:   Use moist heat to help reduce pain and swelling as told by your health care provider.  Rest your finger and avoid activities that make pain worse. Return to normal activities as told by your health care provider.  If you have a splint, wear it as told by your health care provider.  Take over-the-counter and prescription medicines only as told by your health care provider.  Keep all follow-up visits as told by your health care provider. This is important. Contact a health care provider if:  Your symptoms are not improving with home care. Summary  Trigger finger (stenosing tenosynovitis) causes your finger to get stuck in a bent position, and it can make it difficult and painful to straighten your finger.  This condition develops when a finger tendon or tendon sheath thickens.  Treatment starts with resting, wearing a splint, and taking NSAIDs.  In severe cases, surgery to open the tendon sheath may be needed. This information is not intended to replace advice given to you by your health care provider. Make sure you discuss any  questions you have with your health care provider. Document Released: 04/24/2004 Document Revised: 06/17/2017 Document Reviewed: 06/15/2016 Elsevier Patient Education  2020 [employer].

## 2019-01-24 NOTE — Progress Notes (Signed)
Subjective:  Patient ID: Cindy Simpson, female    DOB: 12/10/1945  Age: 73 y.o. MRN: [id number]  CC: No chief complaint on file.   HPI Cindy Simpson presents for L 4th trigger finger F/u HTN, hypothyroidism  Outpatient Medications Prior to Visit  Medication Sig Dispense Refill  . aspirin EC 81 MG EC tablet Take 1 tablet (81 mg total) by mouth daily. 30 tablet 3  . bismuth subsalicylate (PEPTO BISMOL) 262 MG/15ML suspension Take 30 mLs by mouth every 6 (six) hours as needed for indigestion.    . carvedilol (COREG) 6.25 MG tablet Take 1 tablet (6.25 mg total) by mouth 2 (two) times daily with a meal. 180 tablet 3  . cetirizine (ZYRTEC) 10 MG tablet Take 10 mg by mouth at bedtime.    . Cholecalciferol 1000 UNITS tablet Take 1,000 Units by mouth daily after supper.     . gabapentin (NEURONTIN) 100 MG capsule Take 1 capsule (100 mg total) by mouth at bedtime. 90 capsule 0  . glucose blood (ACCU-CHEK AVIVA) test strip Use to check blood sugars twice a day 100 each 5  . Lancets (ACCU-CHEK SOFT TOUCH) lancets Use to check blood sugars twice a day 100 each 5  . levothyroxine (SYNTHROID, LEVOTHROID) 75 MCG tablet TAKE 1 TABLET BY MOUTH  DAILY 90 tablet 3  . lovastatin (MEVACOR) 10 MG tablet TAKE 1 TABLET BY MOUTH  DAILY 90 tablet 2  . Multiple Vitamin (MULTIVITAMIN WITH MINERALS) TABS tablet Take 1 tablet by mouth every evening.     . pantoprazole (PROTONIX) 40 MG tablet Take 1 tablet (40 mg total) by mouth 2 (two) times daily. 180 tablet 3  . TURMERIC PO Take 1 tablet by mouth daily.     . valsartan (DIOVAN) 80 MG tablet TAKE 1 TABLET BY MOUTH EVERY DAY 90 tablet 3   Facility-Administered Medications Prior to Visit  Medication Dose Route Frequency Provider Last Rate Last Dose  . 0.9 %  sodium chloride infusion  500 mL Intravenous Once Charlie Pitter III, MD        ROS: Review of Systems  Constitutional: Negative for activity change, appetite change, chills, fatigue and unexpected  weight change.  HENT: Negative for congestion, mouth sores and sinus pressure.   Eyes: Negative for visual disturbance.  Respiratory: Negative for cough and chest tightness.   Gastrointestinal: Negative for abdominal pain and nausea.  Genitourinary: Negative for difficulty urinating, frequency and vaginal pain.  Musculoskeletal: Positive for arthralgias. Negative for back pain and gait problem.  Skin: Negative for pallor and rash.  Neurological: Negative for dizziness, tremors, weakness, numbness and headaches.  Psychiatric/Behavioral: Negative for confusion, sleep disturbance and suicidal ideas.    Objective:  BP 118/80 (BP Location: Left Arm, Patient Position: Sitting, Cuff Size: Normal)   Pulse 80   Temp 98.4 F (36.9 C) (Oral)   Ht 5\' 1"  (1.549 m)   Wt 156 lb (70.8 kg)   SpO2 96%   BMI 29.48 kg/m   BP Readings from Last 3 Encounters:  01/24/19 118/80  01/23/19 (!) 127/47  08/15/18 100/60    Wt Readings from Last 3 Encounters:  01/24/19 156 lb (70.8 kg)  01/23/19 155 lb (70.3 kg)  01/09/19 154 lb (69.9 kg)    Physical Exam Constitutional:      General: She is not in acute distress.    Appearance: Normal appearance. She is well-developed. She is obese.  HENT:     Head: Normocephalic.  Right Ear: External ear normal.     Left Ear: External ear normal.     Nose: Nose normal.  Eyes:     General:        Right eye: No discharge.        Left eye: No discharge.     Conjunctiva/sclera: Conjunctivae normal.     Pupils: Pupils are equal, round, and reactive to light.  Neck:     Musculoskeletal: Normal range of motion and neck supple.     Thyroid: No thyromegaly.     Vascular: No JVD.     Trachea: No tracheal deviation.  Cardiovascular:     Rate and Rhythm: Normal rate and regular rhythm.     Heart sounds: Normal heart sounds.  Pulmonary:     Effort: No respiratory distress.     Breath sounds: No stridor. No wheezing.  Abdominal:     General: Bowel sounds are  normal. There is no distension.     Palpations: Abdomen is soft. There is no mass.     Tenderness: There is no abdominal tenderness. There is no guarding or rebound.  Musculoskeletal:        General: No tenderness.  Lymphadenopathy:     Cervical: No cervical adenopathy.  Skin:    Findings: No erythema or rash.  Neurological:     Cranial Nerves: No cranial nerve deficit.     Motor: No abnormal muscle tone.     Coordination: Coordination normal.     Deep Tendon Reflexes: Reflexes normal.  Psychiatric:        Behavior: Behavior normal.        Thought Content: Thought content normal.        Judgment: Judgment normal.    L 4th trigger finger  Lab Results  Component Value Date   WBC 14.2 (H) 07/05/2018   HGB 12.3 07/05/2018   HCT 39.3 07/05/2018   PLT 361 07/05/2018   GLUCOSE 120 (H) 07/05/2018   CHOL 166 09/14/2015   TRIG 93 09/14/2015   HDL 45 09/14/2015   LDLDIRECT 137.5 03/17/2009   LDLCALC 102 (H) 09/14/2015   ALT 15 07/05/2018   AST 25 07/05/2018   NA 138 07/05/2018   K 4.0 07/05/2018   CL 107 07/05/2018   CREATININE 1.16 (H) 07/05/2018   BUN 25 (H) 07/05/2018   CO2 18 (L) 07/05/2018   TSH 2.54 08/31/2016   INR 0.99 03/03/2016   HGBA1C 6.1 10/12/2016    Ct Abdomen Pelvis W Contrast  Result Date: 07/06/2018 CLINICAL DATA:  Mid to lower abdominal pain. No relief from medications. Elevated white cells. EXAM: CT ABDOMEN AND PELVIS WITH CONTRAST TECHNIQUE: Multidetector CT imaging of the abdomen and pelvis was performed using the standard protocol following bolus administration of intravenous contrast. CONTRAST:  80mL ISOVUE-300 IOPAMIDOL (ISOVUE-300) INJECTION 61% COMPARISON:  04/05/2017 FINDINGS: Lower chest: Focal infiltration in the right lung base is unchanged since previous study, possibly indicating scarring. Postoperative changes at the EG junction consistent with fundoplication. Visualized distal esophagus appears mildly dilated and fluid-filled. This could  indicate dysmotility, reflux, or distal esophageal stricture. Hepatobiliary: No focal liver abnormality is seen. No gallstones, gallbladder wall thickening, or biliary dilatation. Pancreas: Unremarkable. No pancreatic ductal dilatation or surrounding inflammatory changes. Spleen: Normal in size without focal abnormality. Adrenals/Urinary Tract: No adrenal gland nodules. Cyst in the upper pole right kidney without change. No hydronephrosis or hydroureter. Nephrograms are symmetrical. Bladder is decompressed. Stomach/Bowel: Stomach, small bowel, and colon are not abnormally distended. Prominent  colonic diverticulosis without obvious diverticulitis. No abscess. Mild diffuse sigmoid colon wall thickening probably relates to muscular hypertrophy. Appendix is prominent but is mostly air-filled to the distal tip, similar to previous study. No definite inflammatory changes period. Vascular/Lymphatic: Aortic atherosclerosis. No enlarged abdominal or pelvic lymph nodes. Reproductive: Status post hysterectomy. No adnexal masses. Other: No free air or free fluid in the abdomen. Small periumbilical hernia containing fat. Small ventral anterior abdominal wall hernia below the umbilicus containing fat. No evidence of fat necrosis. Musculoskeletal: Mild degenerative changes in the spine. No destructive bone lesions. IMPRESSION: 1. No acute process demonstrated in the abdomen or pelvis. No evidence of bowel obstruction. Prominent colonic diverticulosis without evidence of diverticulitis. Appendix is prominent but is mostly air-filled to the distal tip, similar to previous study. No definite inflammatory changes to suggest appendicitis. 2. Postoperative fundoplication with mild fluid distention of the distal esophagus. 3. Aortic atherosclerosis. Electronically Signed   By: Burman Nieves M.D.   On: 07/06/2018 00:51    Assessment & Plan:   There are no diagnoses linked to this encounter.   No orders of the defined types were  placed in this encounter.    Follow-up: No follow-ups on file.  [name], MD

## 2019-01-24 NOTE — Assessment & Plan Note (Signed)
L 4th Voltaren gel Will inject if not better

## 2019-01-25 ENCOUNTER — Telehealth: Payer: Self-pay

## 2019-01-25 NOTE — Telephone Encounter (Signed)
Returned patient's call. No answer. Left message that she will receive a letter in the mail from Dr. Loletha Carrow in 1-2 weeks with her pathology results and as long as she is recovering well from her procedure there is no need to call us back, however if she has concerns regarding her care to please return our call.

## 2019-01-25 NOTE — Telephone Encounter (Signed)
Attempted to reach pt. For post-procedure f/u call. No answer. Left message for her to please not hesitate to call us if she has any questions/concerns regarding her care and if she develops any symptoms of COVID 19.

## 2019-01-25 NOTE — Telephone Encounter (Signed)
Pt stated that she is doing fine, no need to call back, no major problems.

## 2019-01-26 ENCOUNTER — Encounter: Payer: Self-pay | Admitting: Gastroenterology

## 2019-02-06 ENCOUNTER — Ambulatory Visit: Payer: Medicare Other | Admitting: Podiatry

## 2019-02-06 ENCOUNTER — Other Ambulatory Visit: Payer: Self-pay

## 2019-02-06 ENCOUNTER — Encounter: Payer: Self-pay | Admitting: Podiatry

## 2019-02-06 VITALS — Temp 96.9°F

## 2019-02-06 DIAGNOSIS — Q828 Other specified congenital malformations of skin: Secondary | ICD-10-CM | POA: Diagnosis not present

## 2019-02-06 DIAGNOSIS — E1149 Type 2 diabetes mellitus with other diabetic neurological complication: Secondary | ICD-10-CM | POA: Diagnosis not present

## 2019-02-06 DIAGNOSIS — B351 Tinea unguium: Secondary | ICD-10-CM

## 2019-02-07 NOTE — Progress Notes (Signed)
Subjective: 73 year old female presents the office today for evaluation of neuropathy as well as calluses.  She also has questions regarding nail fungus.  She states that she is been using over-the-counter medicine for the last couple weeks has not seen much change.  She has no pain in the nails and she denies any redness or drainage.  She done the gabapentin 100 mg and she states her pain substantially improved and has not had any pain since starting the medication.  Calluses to become painful with pressure at times.  She does file them down her self at home.Denies any systemic complaints such as fevers, chills, nausea, vomiting. No acute changes since last appointment, and no other complaints at this time.   Objective: AAO x3, NAD DP/PT pulses palpable bilaterally, CRT less than 3 seconds Overall the neuropathy symptoms have resolved with the gabapentin.  Hyperkeratotic lesion submetatarsal 1, 3, 5 bilaterally.  No ongoing ulceration drainage or signs of infection.  The nails are hypertrophic, dystrophic, discolored but no pain in the nails no redness or drainage.  No open lesions or pre-ulcerative lesions.  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  -Continue current dose of gabapentin 100 mg at nighttime -As a courtesy I debrided the calluses x6 without complications or bleeding -Discuss treatment options for nail fungus.  Discussed duration of use of topical antifungals.  She is can continue with the topical that she is already purchased.  I offered prescription but she has not hold off on that today. -Patient encouraged to call the office with any questions, concerns, change in symptoms.   Trula Slade DPM

## 2019-02-22 DIAGNOSIS — M419 Scoliosis, unspecified: Secondary | ICD-10-CM | POA: Diagnosis not present

## 2019-02-22 DIAGNOSIS — M545 Low back pain: Secondary | ICD-10-CM | POA: Diagnosis not present

## 2019-02-22 DIAGNOSIS — G8929 Other chronic pain: Secondary | ICD-10-CM | POA: Diagnosis not present

## 2019-02-22 DIAGNOSIS — M25551 Pain in right hip: Secondary | ICD-10-CM | POA: Diagnosis not present

## 2019-02-26 ENCOUNTER — Other Ambulatory Visit: Payer: Self-pay | Admitting: Internal Medicine

## 2019-03-13 ENCOUNTER — Other Ambulatory Visit: Payer: Self-pay | Admitting: *Deleted

## 2019-03-13 MED ORDER — VALSARTAN 80 MG PO TABS: 80.0000 mg | ORAL_TABLET | Freq: Every day | ORAL | 2 refills | Status: DC

## 2019-03-27 DIAGNOSIS — K1321 Leukoplakia of oral mucosa, including tongue: Secondary | ICD-10-CM | POA: Diagnosis not present

## 2019-03-27 DIAGNOSIS — K1239 Other oral mucositis (ulcerative): Secondary | ICD-10-CM | POA: Diagnosis not present

## 2019-03-28 ENCOUNTER — Emergency Department (HOSPITAL_COMMUNITY)
Admission: EM | Admit: 2019-03-28 | Discharge: 2019-03-28 | Disposition: A | Discharge status: Home / Self Care | Payer: Medicare Other | Attending: Emergency Medicine | Admitting: Emergency Medicine

## 2019-03-28 ENCOUNTER — Emergency Department (HOSPITAL_COMMUNITY): Payer: Medicare Other

## 2019-03-28 ENCOUNTER — Encounter (HOSPITAL_COMMUNITY): Payer: Self-pay | Admitting: Radiology

## 2019-03-28 ENCOUNTER — Other Ambulatory Visit: Payer: Self-pay

## 2019-03-28 DIAGNOSIS — K5730 Diverticulosis of large intestine without perforation or abscess without bleeding: Secondary | ICD-10-CM | POA: Diagnosis not present

## 2019-03-28 DIAGNOSIS — E039 Hypothyroidism, unspecified: Secondary | ICD-10-CM | POA: Insufficient documentation

## 2019-03-28 DIAGNOSIS — I2510 Atherosclerotic heart disease of native coronary artery without angina pectoris: Secondary | ICD-10-CM | POA: Insufficient documentation

## 2019-03-28 DIAGNOSIS — R110 Nausea: Secondary | ICD-10-CM | POA: Diagnosis not present

## 2019-03-28 DIAGNOSIS — Z79899 Other long term (current) drug therapy: Secondary | ICD-10-CM | POA: Insufficient documentation

## 2019-03-28 DIAGNOSIS — I10 Essential (primary) hypertension: Secondary | ICD-10-CM | POA: Diagnosis not present

## 2019-03-28 DIAGNOSIS — R197 Diarrhea, unspecified: Secondary | ICD-10-CM | POA: Diagnosis not present

## 2019-03-28 DIAGNOSIS — K828 Other specified diseases of gallbladder: Secondary | ICD-10-CM | POA: Diagnosis not present

## 2019-03-28 DIAGNOSIS — R61 Generalized hyperhidrosis: Secondary | ICD-10-CM | POA: Diagnosis not present

## 2019-03-28 DIAGNOSIS — R109 Unspecified abdominal pain: Secondary | ICD-10-CM | POA: Diagnosis present

## 2019-03-28 DIAGNOSIS — K429 Umbilical hernia without obstruction or gangrene: Secondary | ICD-10-CM | POA: Diagnosis not present

## 2019-03-28 DIAGNOSIS — I959 Hypotension, unspecified: Secondary | ICD-10-CM | POA: Diagnosis not present

## 2019-03-28 DIAGNOSIS — Z7982 Long term (current) use of aspirin: Secondary | ICD-10-CM | POA: Diagnosis not present

## 2019-03-28 DIAGNOSIS — K439 Ventral hernia without obstruction or gangrene: Secondary | ICD-10-CM | POA: Diagnosis not present

## 2019-03-28 LAB — CBC WITH DIFFERENTIAL/PLATELET
Abs Immature Granulocytes: 0.03 10*3/uL (ref 0.00–0.07)
Basophils Absolute: 0.1 10*3/uL (ref 0.0–0.1)
Basophils Relative: 1 %
Eosinophils Absolute: 0.2 10*3/uL (ref 0.0–0.5)
Eosinophils Relative: 2 %
HCT: 36.5 % (ref 36.0–46.0)
Hemoglobin: 11.5 g/dL — ABNORMAL LOW (ref 12.0–15.0)
Immature Granulocytes: 0 %
Lymphocytes Relative: 18 %
Lymphs Abs: 1.7 10*3/uL (ref 0.7–4.0)
MCH: 30.7 pg (ref 26.0–34.0)
MCHC: 31.5 g/dL (ref 30.0–36.0)
MCV: 97.3 fL (ref 80.0–100.0)
Monocytes Absolute: 0.7 10*3/uL (ref 0.1–1.0)
Monocytes Relative: 8 %
Neutro Abs: 6.8 10*3/uL (ref 1.7–7.7)
Neutrophils Relative %: 71 %
Platelets: 292 10*3/uL (ref 150–400)
RBC: 3.75 MIL/uL — ABNORMAL LOW (ref 3.87–5.11)
RDW: 13 % (ref 11.5–15.5)
WBC: 9.5 10*3/uL (ref 4.0–10.5)
nRBC: 0 % (ref 0.0–0.2)

## 2019-03-28 LAB — COMPREHENSIVE METABOLIC PANEL
ALT: 13 U/L (ref 0–44)
AST: 21 U/L (ref 15–41)
Albumin: 4 g/dL (ref 3.5–5.0)
Alkaline Phosphatase: 90 U/L (ref 38–126)
Anion gap: 7 (ref 5–15)
BUN: 22 mg/dL (ref 8–23)
CO2: 28 mmol/L (ref 22–32)
Calcium: 9.1 mg/dL (ref 8.9–10.3)
Chloride: 101 mmol/L (ref 98–111)
Creatinine, Ser: 1.06 mg/dL — ABNORMAL HIGH (ref 0.44–1.00)
GFR calc Af Amer: >60 mL/min (ref >60)
GFR calc non Af Amer: 52 mL/min — ABNORMAL LOW (ref >60)
Glucose, Bld: 97 mg/dL (ref 70–99)
Potassium: 4.2 mmol/L (ref 3.5–5.1)
Sodium: 136 mmol/L (ref 135–145)
Total Bilirubin: 0.7 mg/dL (ref 0.3–1.2)
Total Protein: 7.1 g/dL (ref 6.5–8.1)

## 2019-03-28 LAB — URINALYSIS, ROUTINE W REFLEX MICROSCOPIC
Bilirubin Urine: NEGATIVE
Glucose, UA: NEGATIVE mg/dL
Hgb urine dipstick: NEGATIVE
Ketones, ur: NEGATIVE mg/dL
Nitrite: NEGATIVE
Protein, ur: NEGATIVE mg/dL
Specific Gravity, Urine: 1.017 (ref 1.005–1.030)
pH: 7 (ref 5.0–8.0)

## 2019-03-28 LAB — TROPONIN I (HIGH SENSITIVITY)
Troponin I (High Sensitivity): <2 ng/L (ref <18)
Troponin I (High Sensitivity): <2 ng/L (ref <18)

## 2019-03-28 LAB — LIPASE, BLOOD: Lipase: 31 U/L (ref 11–51)

## 2019-03-28 MED ORDER — IOHEXOL 300 MG/ML  SOLN
100.0000 mL | Freq: Once | INTRAMUSCULAR | Status: AC | PRN
  Administered 2019-03-28: 100 mL via INTRAVENOUS

## 2019-03-28 MED ORDER — SODIUM CHLORIDE (PF) 0.9 % IJ SOLN
INTRAMUSCULAR | Status: AC
  Filled 2019-03-28: qty 50

## 2019-03-28 NOTE — ED Triage Notes (Addendum)
Pt arrives to ED via [facility] coming from home c/o one episode of sharp stabbing like abdominal pain, she broke out into a sweat and became extremely nauseated but never threw up. Pt states she has had on going abdominal issues but the sweating and nausea scared her today because that has never happened before. EMS administer 4 mg of zofran en route with relief. EMS reports their 12 lead was unremarkable. Pt has had a heart attack 4 years and that is what scared her about the sweating and nausea with the sharp pain.

## 2019-03-28 NOTE — ED Provider Notes (Signed)
[facility] DEPT Provider Note   CSN: [id number] Arrival date & time: 03/28/19  1206     History   Chief Complaint Chief Complaint  Patient presents with  . Abdominal Pain  . Nausea    HPI Cindy Simpson is a 73 y.o. female.     HPI Patient with history of chronic abdominal pain and intermittent constipation and diarrhea.  At roughly 10 AM this morning she had episode of sharp abdominal pain associated with nausea and diaphoresis.  Patient was concerned that the episode and called EMS.  She continues to have mild nausea.  She denies abdominal pain.  Denies chest pain at any point or shortness of breath.  No recent fever or chills.   Past Medical History:  Diagnosis Date  . Allergic rhinitis   . Allergy   . Anemia   . Asthmatic bronchitis 2009   allergies; post nasal drip but not allergies  . GERD (gastroesophageal reflux disease)   . History of hiatal hernia   . Hyperlipidemia   . Hypertension   . Hypothyroidism   . IBS (irritable bowel syndrome)   . Insomnia   . Irregular heart beat Not sure; long time ago   Periodic irregular heart rate; take verapamil.  . Status post dilation of esophageal narrowing   . Torticollis 2006   Estimate date; rec'd botox and resolved    Patient Active Problem List   Diagnosis Date Noted  . CAD (coronary artery disease) 01/24/2019  . Trigger finger 01/24/2019  . Abdominal pain 07/24/2018  . Hearing loss 04/18/2018  . Diarrhea 04/20/2017  . Constipation 01/17/2017  . Right hip pain 10/12/2016  . Chest wall pain 06/29/2016  . Hiatal hernia with gastroesophageal reflux 03/09/2016  . Deficiency anemia 11/03/2015  . Hyperglycemia 11/03/2015  . Eczema of both hands 02/28/2015  . Heart murmur 02/28/2015  . Onychomycosis 02/18/2014  . Callus of foot 02/18/2014  . Dizziness 08/14/2013  . Cheilitis 10/16/2012  . Hypokalemia 06/22/2012  . Leukocytosis 06/22/2012  . Viral gastroenteritis with  intractable nausea and vomiting 06/22/2012  . Well adult exam 04/17/2012  . Right shoulder pain 04/17/2012  . Hypertension 10/16/2010  . HIP PAIN 03/24/2010  . Acute upper respiratory infection 10/20/2009  . ARTHRALGIA 10/20/2009  . Spell of generalized weakness 10/20/2009  . Hypothyroidism 03/15/2008  . Dyslipidemia 03/15/2008  . INSOMNIA, PERSISTENT 03/15/2008  . ALLERGIC RHINITIS 03/15/2008  . Asthma 03/15/2008  . GERD 03/15/2008    Past Surgical History:  Procedure Laterality Date  . ABDOMINAL HYSTERECTOMY     with ovaries removed also.  Marland Kitchen CARDIAC CATHETERIZATION N/A 09/13/2015   Procedure: Left Heart Cath and Coronary Angiography;  Surgeon: Charolette Forward, MD;  Location: [facility];  Service: Cardiovascular;  Laterality: N/A;  . CATARACT EXTRACTION Bilateral 2014   approx 2 years ago  . CATARACT EXTRACTION    . CLEFT PALATE REPAIR     child  . COLONOSCOPY    . ESOPHAGOGASTRODUODENOSCOPY (EGD) WITH PROPOFOL N/A 12/30/2015   Procedure: ESOPHAGOGASTRODUODENOSCOPY (EGD) WITH PROPOFOL;  Surgeon: Doran Stabler, MD;  Location: [facility];  Service: Gastroenterology;  Laterality: N/A;  . HIATAL HERNIA REPAIR N/A 03/09/2016   Procedure: LAPAROSCOPIC REPAIR OF HIATAL HERNIA  NISSEN FUNDOPLICATION UPPER ENDOSCOPY;  Surgeon: Jackolyn Confer, MD;  Location: [facility];  Service: General;  Laterality: N/A;  . NISSEN FUNDOPLICATION N/A [id number]   Procedure: NISSEN FUNDOPLICATION;  Surgeon: Jackolyn Confer, MD;  Location: [facility];  Service: General;  Laterality: N/A;  . TUBAL LIGATION Bilateral 1985  . UPPER GI ENDOSCOPY  03/09/2016   Procedure: UPPER GI ENDOSCOPY;  Surgeon: Jackolyn Confer, MD;  Location: [facility];  Service: General;;     OB History   No obstetric history on file.      Home Medications    Prior to Admission medications   Medication Sig Start Date End Date Taking? Authorizing Provider  aspirin (BAYER ASPIRIN) 325 MG tablet Take 0.5 tablets (162 mg total) by  mouth daily. 01/24/19  Yes [name], Evie Lacks, MD  bismuth subsalicylate (PEPTO BISMOL) 262 MG/15ML suspension Take 30 mLs by mouth every 6 (six) hours as needed for indigestion.   Yes [provider]  carvedilol (COREG) 6.25 MG tablet Take 1 tablet (6.25 mg total) by mouth 2 (two) times daily with a meal. 10/19/18  Yes [name], Evie Lacks, MD  cetirizine (ZYRTEC) 10 MG tablet Take 10 mg by mouth at bedtime.   Yes [provider]  Cholecalciferol 1000 UNITS tablet Take 1,000 Units by mouth daily after supper.  10/13/10  Yes [name], Evie Lacks, MD  gabapentin (NEURONTIN) 100 MG capsule Take 1 capsule (100 mg total) by mouth at bedtime. 12/26/18  Yes Trula Slade, DPM  levothyroxine (SYNTHROID) 75 MCG tablet TAKE 1 TABLET BY MOUTH  DAILY Patient taking differently: Take 75 mcg by mouth daily before breakfast.  02/26/19  Yes [name], Evie Lacks, MD  lovastatin (MEVACOR) 10 MG tablet TAKE 1 TABLET BY MOUTH  DAILY 02/26/19  Yes [name], Evie Lacks, MD  Multiple Vitamin (MULTIVITAMIN WITH MINERALS) TABS tablet Take 1 tablet by mouth every evening.    Yes [provider]  pantoprazole (PROTONIX) 40 MG tablet Take 1 tablet (40 mg total) by mouth 2 (two) times daily. 10/19/18  Yes [name], Evie Lacks, MD  TURMERIC PO Take 1 tablet by mouth daily.    Yes [provider]  valsartan (DIOVAN) 80 MG tablet Take 1 tablet (80 mg total) by mouth daily. 03/13/19  Yes [name], Evie Lacks, MD  diclofenac sodium (VOLTAREN) 1 % GEL Apply 2 g topically 4 (four) times daily. Patient not taking: Reported on 03/28/2019 01/24/19   [name], Evie Lacks, MD  glucose blood (ACCU-CHEK AVIVA) test strip Use to check blood sugars twice a day 10/25/16   [name], Evie Lacks, MD  Lancets (ACCU-CHEK SOFT TOUCH) lancets Use to check blood sugars twice a day 10/25/16   [name], Evie Lacks, MD    Family History Family History  Problem Relation Age of Onset  . Hypertension Mother   . Heart disease  Father   . Stroke Father   . Stroke Brother        brain aneurism  . Colon polyps Sister   . Other Sister        c diff-deceased  . Stomach cancer Neg Hx   . Rectal cancer Neg Hx   . Esophageal cancer Neg Hx   . Colon cancer Neg Hx     Social History Social History   Tobacco Use  . Smoking status: Never Smoker  . Smokeless tobacco: Never Used  Substance Use Topics  . Alcohol use: Yes    Alcohol/week: 1.0 - 2.0 standard drinks    Types: 1 - 2 Shots of liquor per week    Comment: Perhaps every couple of weeks; not very frequently  . Drug use: No     Allergies   Lisinopril, Vinegar [acetic acid], and Metoclopramide hcl   Review of Systems  Review of Systems  Constitutional: Positive for diaphoresis. Negative for chills and fever.  HENT: Negative for sore throat and trouble swallowing.   Eyes: Negative for visual disturbance.  Respiratory: Negative for cough and shortness of breath.   Cardiovascular: Negative for chest pain, palpitations and leg swelling.  Gastrointestinal: Positive for abdominal pain, constipation, diarrhea and nausea. Negative for abdominal distention, blood in stool and vomiting.  Genitourinary: Negative for dysuria, flank pain, frequency, hematuria and pelvic pain.  Musculoskeletal: Negative for back pain, myalgias and neck pain.  Skin: Negative for rash and wound.  Neurological: Negative for dizziness, syncope, speech difficulty, weakness, light-headedness, numbness and headaches.  Psychiatric/Behavioral: The patient is nervous/anxious.   All other systems reviewed and are negative.    Physical Exam Updated Vital Signs BP 117/64   Pulse (!) 58   Temp 97.8 F (36.6 C) (Oral)   Resp (!) 21   Ht 5\' 1"  (1.549 m)   Wt 70.8 kg   SpO2 97%   BMI 29.49 kg/m   Physical Exam Vitals signs and nursing note reviewed.  Constitutional:      Appearance: Normal appearance. She is well-developed.     Comments: Anxious appearing.  HENT:     Head:  Normocephalic and atraumatic.     Comments: No facial asymmetry.    Nose: Nose normal.     Mouth/Throat:     Mouth: Mucous membranes are moist.  Eyes:     Extraocular Movements: Extraocular movements intact.     Pupils: Pupils are equal, round, and reactive to light.  Neck:     Musculoskeletal: Normal range of motion and neck supple. No neck rigidity or muscular tenderness.  Cardiovascular:     Rate and Rhythm: Normal rate and regular rhythm.     Heart sounds: No murmur. No friction rub. No gallop.   Pulmonary:     Effort: Pulmonary effort is normal. No respiratory distress.     Breath sounds: Normal breath sounds. No stridor. No wheezing, rhonchi or rales.  Chest:     Chest wall: No tenderness.  Abdominal:     General: Bowel sounds are normal. There is no distension.     Palpations: Abdomen is soft. There is no mass.     Tenderness: There is abdominal tenderness. There is no right CVA tenderness, left CVA tenderness, guarding or rebound.     Hernia: No hernia is present.     Comments: Bilateral lower quadrant tenderness to palpation.  There is no rebound or guarding.  Musculoskeletal: Normal range of motion.        General: No swelling, tenderness, deformity or signs of injury.     Right lower leg: No edema.     Left lower leg: No edema.     Comments: Distal pulses intact.  No lower extremity swelling, asymmetry or tenderness.  Lymphadenopathy:     Cervical: No cervical adenopathy.  Skin:    General: Skin is warm and dry.     Capillary Refill: Capillary refill takes less than 2 seconds.     Findings: No erythema or rash.  Neurological:     General: No focal deficit present.     Mental Status: She is alert and oriented to person, place, and time.     Comments: 5/5 motor in all extremities.  Sensation fully intact.  Patient speaks with clear voice.  Psychiatric:        Behavior: Behavior normal.      ED Treatments / Results  Labs (all  labs ordered are listed, but only  abnormal results are displayed) Labs Reviewed  CBC WITH DIFFERENTIAL/PLATELET - Abnormal; Notable for the following components:      Result Value   RBC 3.75 (*)    Hemoglobin 11.5 (*)    All other components within normal limits  COMPREHENSIVE METABOLIC PANEL - Abnormal; Notable for the following components:   Creatinine, Ser 1.06 (*)    GFR calc non Af Amer 52 (*)    All other components within normal limits  URINALYSIS, ROUTINE W REFLEX MICROSCOPIC - Abnormal; Notable for the following components:   Leukocytes,Ua SMALL (*)    Bacteria, UA RARE (*)    All other components within normal limits  LIPASE, BLOOD  TROPONIN I (HIGH SENSITIVITY)  TROPONIN I (HIGH SENSITIVITY)    EKG EKG Interpretation  Date/Time:  Wednesday March 28 2019 12:38:53 EDT Ventricular Rate:  55 PR Interval:    QRS Duration: 106 QT Interval:  457 QTC Calculation: 438 R Axis:   47 Text Interpretation:  Sinus rhythm Atrial premature complex Confirmed by Julianne Rice [phone]) on 03/28/2019 12:43:31 PM   Radiology Ct Abdomen Pelvis W Contrast  Result Date: 03/28/2019 CLINICAL DATA:  Acute generalized abdominal pain. EXAM: CT ABDOMEN AND PELVIS WITH CONTRAST TECHNIQUE: Multidetector CT imaging of the abdomen and pelvis was performed using the standard protocol following bolus administration of intravenous contrast. CONTRAST:  179mL OMNIPAQUE IOHEXOL 300 MG/ML  SOLN COMPARISON:  CT scan of July 06, 2018. FINDINGS: Lower chest: No acute abnormality. Hepatobiliary: No gallstones are noted. Dilated gallbladder is noted. No biliary dilatation is noted. No hepatic abnormality is noted. Pancreas: Unremarkable. No pancreatic ductal dilatation or surrounding inflammatory changes. Spleen: Normal in size without focal abnormality. Adrenals/Urinary Tract: Adrenal glands appear normal. Stable large right renal cyst is noted. No hydronephrosis or renal obstruction is noted. No renal or ureteral calculi are noted. Urinary  bladder is unremarkable. Stomach/Bowel: Status post hiatal hernia repair. There is no evidence of bowel obstruction or inflammation. The appendix appears normal. Diverticulosis of descending and sigmoid colon is noted without inflammation. Vascular/Lymphatic: Aortic atherosclerosis. No enlarged abdominal or pelvic lymph nodes. Reproductive: Status post hysterectomy. No adnexal masses. Other: Small fat containing periumbilical and epigastric ventral hernias are noted. No ascites is noted. Musculoskeletal: No acute or significant osseous findings. IMPRESSION: Dilated gallbladder is noted without inflammation or cholelithiasis. No biliary dilatation or hepatic abnormality is noted. Diverticulosis of descending and sigmoid colon is noted without inflammation. Small fat containing periumbilical and epigastric ventral hernias are noted. Aortic Atherosclerosis (ICD10-I70.0). Electronically Signed   By: Marijo Conception M.D.   On: 03/28/2019 15:22    Procedures Procedures (including critical care time)  Medications Ordered in ED Medications  sodium chloride (PF) 0.9 % injection (has no administration in time range)  iohexol (OMNIPAQUE) 300 MG/ML solution 100 mL (100 mLs Intravenous Contrast Given 03/28/19 1453)     Initial Impression / Assessment and Plan / ED Course  I have reviewed the triage vital signs and the nursing notes.  Pertinent labs & imaging results that were available during my care of the patient were reviewed by me and considered in my medical decision making (see chart for details).       Abdominal exam is benign.  CT abdomen pelvis without acute findings.  Troponin x2 is normal though low suspicion this is related to CAD.  Suspicion for possible vasovagal episode.  Patient is advised to follow-up closely with her primary physician and return precautions given.  Final Clinical Impressions(s) / ED Diagnoses   Final diagnoses:  Nausea  Diaphoresis    ED Discharge Orders    None        Julianne Rice, MD 03/28/19 1627

## 2019-03-28 NOTE — ED Notes (Signed)
Pt cannot use restroom at this time, aware urine specimen is needed.  

## 2019-03-28 NOTE — ED Notes (Signed)
Ambulated to the restroom.

## 2019-04-02 DIAGNOSIS — I10 Essential (primary) hypertension: Secondary | ICD-10-CM | POA: Diagnosis not present

## 2019-04-02 DIAGNOSIS — Z1231 Encounter for screening mammogram for malignant neoplasm of breast: Secondary | ICD-10-CM | POA: Diagnosis not present

## 2019-04-02 DIAGNOSIS — E785 Hyperlipidemia, unspecified: Secondary | ICD-10-CM | POA: Diagnosis not present

## 2019-04-02 DIAGNOSIS — E039 Hypothyroidism, unspecified: Secondary | ICD-10-CM | POA: Diagnosis not present

## 2019-04-02 DIAGNOSIS — I2510 Atherosclerotic heart disease of native coronary artery without angina pectoris: Secondary | ICD-10-CM | POA: Diagnosis not present

## 2019-04-02 DIAGNOSIS — I5181 Takotsubo syndrome: Secondary | ICD-10-CM | POA: Diagnosis not present

## 2019-04-04 ENCOUNTER — Encounter: Payer: Self-pay | Admitting: Internal Medicine

## 2019-04-04 ENCOUNTER — Other Ambulatory Visit: Payer: Self-pay | Admitting: Emergency Medicine

## 2019-04-04 ENCOUNTER — Other Ambulatory Visit: Payer: Self-pay

## 2019-04-04 ENCOUNTER — Ambulatory Visit (INDEPENDENT_AMBULATORY_CARE_PROVIDER_SITE_OTHER): Payer: Medicare Other | Admitting: Internal Medicine

## 2019-04-04 DIAGNOSIS — R5383 Other fatigue: Secondary | ICD-10-CM

## 2019-04-04 DIAGNOSIS — R5381 Other malaise: Secondary | ICD-10-CM | POA: Insufficient documentation

## 2019-04-04 DIAGNOSIS — R1084 Generalized abdominal pain: Secondary | ICD-10-CM | POA: Diagnosis not present

## 2019-04-04 DIAGNOSIS — K219 Gastro-esophageal reflux disease without esophagitis: Secondary | ICD-10-CM

## 2019-04-04 DIAGNOSIS — Z20822 Contact with and (suspected) exposure to covid-19: Secondary | ICD-10-CM

## 2019-04-04 DIAGNOSIS — R6889 Other general symptoms and signs: Secondary | ICD-10-CM | POA: Diagnosis not present

## 2019-04-04 NOTE — Assessment & Plan Note (Signed)
No angina Lovastatin, ASA

## 2019-04-04 NOTE — Progress Notes (Signed)
Subjective:  Patient ID: Cindy Simpson, female    DOB: 1945/10/22  Age: 73 y.o. MRN: [id number]  CC: No chief complaint on file.   HPI MATRICE STANSBERRY presents for ER visit for GI upset on 9/9. Abd CT was ok. C/o nausea, malaise, cold-like sx's  Outpatient Medications Prior to Visit  Medication Sig Dispense Refill   aspirin (BAYER ASPIRIN) 325 MG tablet Take 0.5 tablets (162 mg total) by mouth daily. 100 tablet 3   bismuth subsalicylate (PEPTO BISMOL) 262 MG/15ML suspension Take 30 mLs by mouth every 6 (six) hours as needed for indigestion.     carvedilol (COREG) 6.25 MG tablet Take 1 tablet (6.25 mg total) by mouth 2 (two) times daily with a meal. 180 tablet 3   cetirizine (ZYRTEC) 10 MG tablet Take 10 mg by mouth at bedtime.     Cholecalciferol 1000 UNITS tablet Take 1,000 Units by mouth daily after supper.      diclofenac sodium (VOLTAREN) 1 % GEL Apply 2 g topically 4 (four) times daily. 100 g 3   gabapentin (NEURONTIN) 100 MG capsule Take 1 capsule (100 mg total) by mouth at bedtime. 90 capsule 0   glucose blood (ACCU-CHEK AVIVA) test strip Use to check blood sugars twice a day 100 each 5   Lancets (ACCU-CHEK SOFT TOUCH) lancets Use to check blood sugars twice a day 100 each 5   levothyroxine (SYNTHROID) 75 MCG tablet TAKE 1 TABLET BY MOUTH  DAILY (Patient taking differently: Take 75 mcg by mouth daily before breakfast. ) 90 tablet 3   lovastatin (MEVACOR) 10 MG tablet TAKE 1 TABLET BY MOUTH  DAILY 90 tablet 3   Multiple Vitamin (MULTIVITAMIN WITH MINERALS) TABS tablet Take 1 tablet by mouth every evening.      pantoprazole (PROTONIX) 40 MG tablet Take 1 tablet (40 mg total) by mouth 2 (two) times daily. 180 tablet 3   TURMERIC PO Take 1 tablet by mouth daily.      valsartan (DIOVAN) 80 MG tablet Take 1 tablet (80 mg total) by mouth daily. 90 tablet 2   Facility-Administered Medications Prior to Visit  Medication Dose Route Frequency Provider Last Rate Last Dose     0.9 %  sodium chloride infusion  500 mL Intravenous Once Danis, Starr Lake III, MD        ROS: Review of Systems  Constitutional: Positive for fatigue. Negative for activity change, appetite change, chills, fever and unexpected weight change.  HENT: Negative for congestion, dental problem, mouth sores, postnasal drip, sinus pressure, sinus pain and sore throat.   Eyes: Negative for visual disturbance.  Respiratory: Negative for cough and chest tightness.   Gastrointestinal: Positive for nausea. Negative for abdominal pain, diarrhea and vomiting.  Genitourinary: Negative for difficulty urinating, frequency and vaginal pain.  Musculoskeletal: Positive for arthralgias and myalgias. Negative for back pain and gait problem.  Skin: Negative for pallor and rash.  Neurological: Negative for dizziness, tremors, weakness, numbness and headaches.  Psychiatric/Behavioral: Negative for confusion and sleep disturbance.    Objective:  BP 124/82 (BP Location: Left Arm, Patient Position: Sitting, Cuff Size: Normal)    Pulse 67    Temp 98.2 F (36.8 C) (Oral)    Ht 5\' 1"  (1.549 m)    Wt 155 lb (70.3 kg)    SpO2 97%    BMI 29.29 kg/m   BP Readings from Last 3 Encounters:  04/04/19 124/82  03/28/19 133/70  01/24/19 118/80    Wt Readings from  Last 3 Encounters:  04/04/19 155 lb (70.3 kg)  03/28/19 156 lb 1.4 oz (70.8 kg)  01/24/19 156 lb (70.8 kg)    Physical Exam Constitutional:      General: She is not in acute distress.    Appearance: She is well-developed.  HENT:     Head: Normocephalic.     Right Ear: External ear normal.     Left Ear: External ear normal.     Nose: Nose normal.  Eyes:     General:        Right eye: No discharge.        Left eye: No discharge.     Conjunctiva/sclera: Conjunctivae normal.     Pupils: Pupils are equal, round, and reactive to light.  Neck:     Musculoskeletal: Normal range of motion and neck supple.     Thyroid: No thyromegaly.     Vascular: No JVD.      Trachea: No tracheal deviation.  Cardiovascular:     Rate and Rhythm: Normal rate and regular rhythm.     Heart sounds: Normal heart sounds.  Pulmonary:     Effort: No respiratory distress.     Breath sounds: No stridor. No wheezing.  Abdominal:     General: Bowel sounds are normal. There is no distension.     Palpations: Abdomen is soft. There is no mass.     Tenderness: There is no abdominal tenderness. There is no guarding or rebound.  Musculoskeletal:        General: No tenderness.  Lymphadenopathy:     Cervical: No cervical adenopathy.  Skin:    Findings: No erythema or rash.  Neurological:     Cranial Nerves: No cranial nerve deficit.     Motor: No abnormal muscle tone.     Coordination: Coordination normal.     Deep Tendon Reflexes: Reflexes normal.  Psychiatric:        Behavior: Behavior normal.        Thought Content: Thought content normal.        Judgment: Judgment normal.     Lab Results  Component Value Date   WBC 9.5 03/28/2019   HGB 11.5 (L) 03/28/2019   HCT 36.5 03/28/2019   PLT 292 03/28/2019   GLUCOSE 97 03/28/2019   CHOL 186 01/24/2019   TRIG 125.0 01/24/2019   HDL 55.10 01/24/2019   LDLDIRECT 137.5 03/17/2009   LDLCALC 106 (H) 01/24/2019   ALT 13 03/28/2019   AST 21 03/28/2019   NA 136 03/28/2019   K 4.2 03/28/2019   CL 101 03/28/2019   CREATININE 1.06 (H) 03/28/2019   BUN 22 03/28/2019   CO2 28 03/28/2019   TSH 4.26 01/24/2019   INR 0.99 03/03/2016   HGBA1C 6.0 01/24/2019    Ct Abdomen Pelvis W Contrast  Result Date: 03/28/2019 CLINICAL DATA:  Acute generalized abdominal pain. EXAM: CT ABDOMEN AND PELVIS WITH CONTRAST TECHNIQUE: Multidetector CT imaging of the abdomen and pelvis was performed using the standard protocol following bolus administration of intravenous contrast. CONTRAST:  OMNIPAQUE IOHEXOL 300 MG/ML  SOLN COMPARISON:  CT scan of July 06, 2018. FINDINGS: Lower chest: No acute abnormality. Hepatobiliary: No  gallstones are noted. Dilated gallbladder is noted. No biliary dilatation is noted. No hepatic abnormality is noted. Pancreas: Unremarkable. No pancreatic ductal dilatation or surrounding inflammatory changes. Spleen: Normal in size without focal abnormality. Adrenals/Urinary Tract: Adrenal glands appear normal. Stable large right renal cyst is noted. No hydronephrosis or renal obstruction is  noted. No renal or ureteral calculi are noted. Urinary bladder is unremarkable. Stomach/Bowel: Status post hiatal hernia repair. There is no evidence of bowel obstruction or inflammation. The appendix appears normal. Diverticulosis of descending and sigmoid colon is noted without inflammation. Vascular/Lymphatic: Aortic atherosclerosis. No enlarged abdominal or pelvic lymph nodes. Reproductive: Status post hysterectomy. No adnexal masses. Other: Small fat containing periumbilical and epigastric ventral hernias are noted. No ascites is noted. Musculoskeletal: No acute or significant osseous findings. IMPRESSION: Dilated gallbladder is noted without inflammation or cholelithiasis. No biliary dilatation or hepatic abnormality is noted. Diverticulosis of descending and sigmoid colon is noted without inflammation. Small fat containing periumbilical and epigastric ventral hernias are noted. Aortic Atherosclerosis (ICD10-I70.0). Electronically Signed   By: Lupita Raider M.D.   On: 03/28/2019 15:22    Assessment & Plan:   There are no diagnoses linked to this encounter.   No orders of the defined types were placed in this encounter.    Follow-up: No follow-ups on file.  [name], MD

## 2019-04-04 NOTE — Assessment & Plan Note (Signed)
resolved 

## 2019-04-04 NOTE — Assessment & Plan Note (Addendum)
?  etiology Labs/CT reviewed COVID test Tylenol prn

## 2019-04-04 NOTE — Assessment & Plan Note (Signed)
Protonix.  ?

## 2019-04-05 LAB — NOVEL CORONAVIRUS, NAA: SARS-CoV-2, NAA: NOT DETECTED

## 2019-04-27 DIAGNOSIS — M542 Cervicalgia: Secondary | ICD-10-CM | POA: Diagnosis not present

## 2019-05-08 ENCOUNTER — Encounter: Payer: Self-pay | Admitting: Podiatry

## 2019-05-08 ENCOUNTER — Other Ambulatory Visit: Payer: Self-pay

## 2019-05-08 ENCOUNTER — Ambulatory Visit: Payer: Medicare Other | Admitting: Podiatry

## 2019-05-08 DIAGNOSIS — E1149 Type 2 diabetes mellitus with other diabetic neurological complication: Secondary | ICD-10-CM

## 2019-05-08 DIAGNOSIS — M79675 Pain in left toe(s): Secondary | ICD-10-CM

## 2019-05-08 DIAGNOSIS — M79674 Pain in right toe(s): Secondary | ICD-10-CM

## 2019-05-08 DIAGNOSIS — Q828 Other specified congenital malformations of skin: Secondary | ICD-10-CM | POA: Diagnosis not present

## 2019-05-08 DIAGNOSIS — B351 Tinea unguium: Secondary | ICD-10-CM | POA: Diagnosis not present

## 2019-05-08 NOTE — Progress Notes (Signed)
Subjective: 73 year old female presents the office today for evaluation of painful calluses to both nails.  She has been using topical antifungal.  She is nails and calluses are painful but the patient states it has been helping as well for antifungal medicine.  She is not at the gabapentin she is been off this medicine for 1 month and she is to hold off on medication at this time. Denies any systemic complaints such as fevers, chills, nausea, vomiting. No acute changes since last appointment, and no other complaints at this time.   Objective: AAO x3, NAD DP/PT pulses palpable bilaterally, CRT less than 3 seconds Hyperkeratotic lesion submetatarsal 1, 3, 5 bilaterally.  No ongoing ulceration drainage or signs of infection.  The nails are hypertrophic, dystrophic, discolored but no pain in the nails no redness or drainage.  No open lesions or pre-ulcerative lesions.  No pain with calf compression, swelling, warmth, erythema  Assessment: Hyperkeratotic lesions, onychomycosis  Plan: -All treatment options discussed with the patient including all alternatives, risks, complications.  -She was to hold off on gabapentin at this time.  We will continue to monitor. -As a courtesy I debrided the calluses x6 without complications or bleeding -Nails debrided x2 without complications or bleeding antifungal.  RTC 3 months or sooner if needed  Trula Slade DPM

## 2019-05-11 ENCOUNTER — Encounter (INDEPENDENT_AMBULATORY_CARE_PROVIDER_SITE_OTHER): Payer: Self-pay

## 2019-07-02 DIAGNOSIS — I5181 Takotsubo syndrome: Secondary | ICD-10-CM | POA: Diagnosis not present

## 2019-07-02 DIAGNOSIS — I10 Essential (primary) hypertension: Secondary | ICD-10-CM | POA: Diagnosis not present

## 2019-07-02 DIAGNOSIS — E785 Hyperlipidemia, unspecified: Secondary | ICD-10-CM | POA: Diagnosis not present

## 2019-07-02 DIAGNOSIS — E039 Hypothyroidism, unspecified: Secondary | ICD-10-CM | POA: Diagnosis not present

## 2019-07-02 DIAGNOSIS — I2510 Atherosclerotic heart disease of native coronary artery without angina pectoris: Secondary | ICD-10-CM | POA: Diagnosis not present

## 2019-07-18 DIAGNOSIS — H524 Presbyopia: Secondary | ICD-10-CM | POA: Diagnosis not present

## 2019-07-18 DIAGNOSIS — H35033 Hypertensive retinopathy, bilateral: Secondary | ICD-10-CM | POA: Diagnosis not present

## 2019-07-18 DIAGNOSIS — H26493 Other secondary cataract, bilateral: Secondary | ICD-10-CM | POA: Diagnosis not present

## 2019-07-18 DIAGNOSIS — Z961 Presence of intraocular lens: Secondary | ICD-10-CM | POA: Diagnosis not present

## 2019-07-28 ENCOUNTER — Other Ambulatory Visit: Payer: Self-pay

## 2019-07-28 ENCOUNTER — Ambulatory Visit: Payer: Medicare Other | Attending: Internal Medicine

## 2019-07-28 DIAGNOSIS — Z23 Encounter for immunization: Secondary | ICD-10-CM

## 2019-07-28 NOTE — Progress Notes (Signed)
Covid-19 Vaccination Clinic  Name:  Cindy Simpson    MRN: [id number] DOB: 1946/03/18  07/28/2019  [name] was observed post Covid-19 immunization for 15 minutes without incidence. She was provided with Vaccine Information Sheet and instruction to access the V-Safe system.   [name] was instructed to call 911 with any severe reactions post vaccine: Marland Kitchen Difficulty breathing  . Swelling of your face and throat  . A fast heartbeat  . A bad rash all over your body  . Dizziness and weakness    Immunizations Administered    Name Date Dose VIS Date Route   Pfizer COVID-19 Vaccine 07/28/2019 11:58 AM 0.3 mL 06/29/2019 Intramuscular   Manufacturer: [employer], [employer]   Lot: [id number]   NDC: [address]-0

## 2019-07-30 ENCOUNTER — Ambulatory Visit (INDEPENDENT_AMBULATORY_CARE_PROVIDER_SITE_OTHER): Payer: Medicare Other | Admitting: Internal Medicine

## 2019-07-30 ENCOUNTER — Encounter: Payer: Self-pay | Admitting: Internal Medicine

## 2019-07-30 ENCOUNTER — Other Ambulatory Visit: Payer: Self-pay

## 2019-07-30 DIAGNOSIS — E785 Hyperlipidemia, unspecified: Secondary | ICD-10-CM | POA: Diagnosis not present

## 2019-07-30 DIAGNOSIS — I2510 Atherosclerotic heart disease of native coronary artery without angina pectoris: Secondary | ICD-10-CM

## 2019-07-30 DIAGNOSIS — I10 Essential (primary) hypertension: Secondary | ICD-10-CM

## 2019-07-30 DIAGNOSIS — R739 Hyperglycemia, unspecified: Secondary | ICD-10-CM | POA: Diagnosis not present

## 2019-07-30 DIAGNOSIS — D539 Nutritional anemia, unspecified: Secondary | ICD-10-CM

## 2019-07-30 LAB — BASIC METABOLIC PANEL
BUN: 20 mg/dL (ref 6–23)
CO2: 29 mEq/L (ref 19–32)
Calcium: 9.2 mg/dL (ref 8.4–10.5)
Chloride: 101 mEq/L (ref 96–112)
Creatinine, Ser: 0.9 mg/dL (ref 0.40–1.20)
GFR: 61.2 mL/min (ref >60.00)
Glucose, Bld: 91 mg/dL (ref 70–99)
Potassium: 4.3 mEq/L (ref 3.5–5.1)
Sodium: 136 mEq/L (ref 135–145)

## 2019-07-30 LAB — HEMOGLOBIN A1C: Hgb A1c MFr Bld: 5.9 % (ref 4.6–6.5)

## 2019-07-30 NOTE — Progress Notes (Signed)
Subjective:  Patient ID: Cindy Simpson, female    DOB: Apr 29, 1946  Age: 74 y.o. MRN: [id number]  CC: No chief complaint on file.   HPI DYANNA KAMBER presents for HTN, OA, GERD f/u   Outpatient Medications Prior to Visit  Medication Sig Dispense Refill  . aspirin (BAYER ASPIRIN) 325 MG tablet Take 0.5 tablets (162 mg total) by mouth daily. 100 tablet 3  . bismuth subsalicylate (PEPTO BISMOL) 262 MG/15ML suspension Take 30 mLs by mouth every 6 (six) hours as needed for indigestion.    . carvedilol (COREG) 6.25 MG tablet Take 1 tablet (6.25 mg total) by mouth 2 (two) times daily with a meal. 180 tablet 3  . cetirizine (ZYRTEC) 10 MG tablet Take 10 mg by mouth at bedtime.    . Cholecalciferol 1000 UNITS tablet Take 1,000 Units by mouth daily after supper.     . diclofenac sodium (VOLTAREN) 1 % GEL Apply 2 g topically 4 (four) times daily. 100 g 3  . gabapentin (NEURONTIN) 100 MG capsule Take 1 capsule (100 mg total) by mouth at bedtime. 90 capsule 0  . glucose blood (ACCU-CHEK AVIVA) test strip Use to check blood sugars twice a day 100 each 5  . Lancets (ACCU-CHEK SOFT TOUCH) lancets Use to check blood sugars twice a day 100 each 5  . levothyroxine (SYNTHROID) 75 MCG tablet TAKE 1 TABLET BY MOUTH  DAILY (Patient taking differently: Take 75 mcg by mouth daily before breakfast. ) 90 tablet 3  . lovastatin (MEVACOR) 10 MG tablet TAKE 1 TABLET BY MOUTH  DAILY 90 tablet 3  . Multiple Vitamin (MULTIVITAMIN WITH MINERALS) TABS tablet Take 1 tablet by mouth every evening.     . pantoprazole (PROTONIX) 40 MG tablet Take 1 tablet (40 mg total) by mouth 2 (two) times daily. 180 tablet 3  . TURMERIC PO Take 1 tablet by mouth daily.     . valsartan (DIOVAN) 80 MG tablet Take 1 tablet (80 mg total) by mouth daily. 90 tablet 2   Facility-Administered Medications Prior to Visit  Medication Dose Route Frequency Provider Last Rate Last Admin  . 0.9 %  sodium chloride infusion  500 mL Intravenous Once  Charlie Pitter III, MD        ROS: Review of Systems  Constitutional: Negative for activity change, appetite change, chills, fatigue and unexpected weight change.  HENT: Negative for congestion, mouth sores and sinus pressure.   Eyes: Negative for visual disturbance.  Respiratory: Negative for cough and chest tightness.   Gastrointestinal: Negative for abdominal pain and nausea.  Genitourinary: Negative for difficulty urinating, frequency and vaginal pain.  Musculoskeletal: Negative for back pain and gait problem.  Skin: Negative for pallor and rash.  Neurological: Negative for dizziness, tremors, weakness, numbness and headaches.  Psychiatric/Behavioral: Negative for confusion and sleep disturbance. The patient is nervous/anxious.     Objective:  Ht 5\' 1"  (1.549 m)   Wt 156 lb (70.8 kg)   BMI 29.48 kg/m   BP Readings from Last 3 Encounters:  04/04/19 124/82  03/28/19 133/70  01/24/19 118/80    Wt Readings from Last 3 Encounters:  07/30/19 156 lb (70.8 kg)  04/04/19 155 lb (70.3 kg)  03/28/19 156 lb 1.4 oz (70.8 kg)    Physical Exam Constitutional:      General: She is not in acute distress.    Appearance: She is well-developed.  HENT:     Head: Normocephalic.     Right Ear: External  ear normal.     Left Ear: External ear normal.     Nose: Nose normal.  Eyes:     General:        Right eye: No discharge.        Left eye: No discharge.     Conjunctiva/sclera: Conjunctivae normal.     Pupils: Pupils are equal, round, and reactive to light.  Neck:     Thyroid: No thyromegaly.     Vascular: No JVD.     Trachea: No tracheal deviation.  Cardiovascular:     Rate and Rhythm: Normal rate and regular rhythm.     Heart sounds: Normal heart sounds.  Pulmonary:     Effort: No respiratory distress.     Breath sounds: No stridor. No wheezing.  Abdominal:     General: Bowel sounds are normal. There is no distension.     Palpations: Abdomen is soft. There is no mass.      Tenderness: There is no abdominal tenderness. There is no guarding or rebound.  Musculoskeletal:        General: No tenderness.     Cervical back: Normal range of motion and neck supple.  Lymphadenopathy:     Cervical: No cervical adenopathy.  Skin:    Findings: No erythema or rash.  Neurological:     Cranial Nerves: No cranial nerve deficit.     Motor: No abnormal muscle tone.     Coordination: Coordination normal.     Deep Tendon Reflexes: Reflexes normal.  Psychiatric:        Behavior: Behavior normal.        Thought Content: Thought content normal.        Judgment: Judgment normal.   LS tennder  Lab Results  Component Value Date   WBC 9.5 03/28/2019   HGB 11.5 (L) 03/28/2019   HCT 36.5 03/28/2019   PLT 292 03/28/2019   GLUCOSE 97 03/28/2019   CHOL 186 01/24/2019   TRIG 125.0 01/24/2019   HDL 55.10 01/24/2019   LDLDIRECT 137.5 03/17/2009   LDLCALC 106 (H) 01/24/2019   ALT 13 03/28/2019   AST 21 03/28/2019   NA 136 03/28/2019   K 4.2 03/28/2019   CL 101 03/28/2019   CREATININE 1.06 (H) 03/28/2019   BUN 22 03/28/2019   CO2 28 03/28/2019   TSH 4.26 01/24/2019   INR 0.99 03/03/2016   HGBA1C 6.0 01/24/2019    CT ABDOMEN PELVIS W CONTRAST  Result Date: 03/28/2019 CLINICAL DATA:  Acute generalized abdominal pain. EXAM: CT ABDOMEN AND PELVIS WITH CONTRAST TECHNIQUE: Multidetector CT imaging of the abdomen and pelvis was performed using the standard protocol following bolus administration of intravenous contrast. CONTRAST:  OMNIPAQUE IOHEXOL 300 MG/ML  SOLN COMPARISON:  CT scan of July 06, 2018. FINDINGS: Lower chest: No acute abnormality. Hepatobiliary: No gallstones are noted. Dilated gallbladder is noted. No biliary dilatation is noted. No hepatic abnormality is noted. Pancreas: Unremarkable. No pancreatic ductal dilatation or surrounding inflammatory changes. Spleen: Normal in size without focal abnormality. Adrenals/Urinary Tract: Adrenal glands appear normal.  Stable large right renal cyst is noted. No hydronephrosis or renal obstruction is noted. No renal or ureteral calculi are noted. Urinary bladder is unremarkable. Stomach/Bowel: Status post hiatal hernia repair. There is no evidence of bowel obstruction or inflammation. The appendix appears normal. Diverticulosis of descending and sigmoid colon is noted without inflammation. Vascular/Lymphatic: Aortic atherosclerosis. No enlarged abdominal or pelvic lymph nodes. Reproductive: Status post hysterectomy. No adnexal masses. Other: Small fat  containing periumbilical and epigastric ventral hernias are noted. No ascites is noted. Musculoskeletal: No acute or significant osseous findings. IMPRESSION: Dilated gallbladder is noted without inflammation or cholelithiasis. No biliary dilatation or hepatic abnormality is noted. Diverticulosis of descending and sigmoid colon is noted without inflammation. Small fat containing periumbilical and epigastric ventral hernias are noted. Aortic Atherosclerosis (ICD10-I70.0). Electronically Signed   By: Lupita Raider M.D.   On: 03/28/2019 15:22    Assessment & Plan:   There are no diagnoses linked to this encounter.   No orders of the defined types were placed in this encounter.    Follow-up: No follow-ups on file.  [name], MD

## 2019-07-30 NOTE — Assessment & Plan Note (Signed)
CBC

## 2019-07-30 NOTE — Assessment & Plan Note (Signed)
Lovastatin, ASA 

## 2019-07-30 NOTE — Assessment & Plan Note (Signed)
Lovastatin 

## 2019-07-30 NOTE — Assessment & Plan Note (Signed)
Coreg, Diovan 

## 2019-07-30 NOTE — Assessment & Plan Note (Signed)
A1c

## 2019-07-30 NOTE — Addendum Note (Signed)
Addended by: Trenda Moots on: [id number] 09:36 AM   Modules accepted: Orders

## 2019-08-09 ENCOUNTER — Other Ambulatory Visit: Payer: Self-pay

## 2019-08-09 ENCOUNTER — Ambulatory Visit: Payer: Medicare Other | Admitting: Podiatry

## 2019-08-09 DIAGNOSIS — M79675 Pain in left toe(s): Secondary | ICD-10-CM | POA: Diagnosis not present

## 2019-08-09 DIAGNOSIS — E1149 Type 2 diabetes mellitus with other diabetic neurological complication: Secondary | ICD-10-CM | POA: Diagnosis not present

## 2019-08-09 DIAGNOSIS — B351 Tinea unguium: Secondary | ICD-10-CM | POA: Diagnosis not present

## 2019-08-09 DIAGNOSIS — Q828 Other specified congenital malformations of skin: Secondary | ICD-10-CM

## 2019-08-09 DIAGNOSIS — M79674 Pain in right toe(s): Secondary | ICD-10-CM | POA: Diagnosis not present

## 2019-08-16 ENCOUNTER — Ambulatory Visit: Payer: Medicare Other

## 2019-08-18 ENCOUNTER — Ambulatory Visit: Payer: Medicare Other | Attending: Internal Medicine

## 2019-08-18 DIAGNOSIS — Z23 Encounter for immunization: Secondary | ICD-10-CM | POA: Insufficient documentation

## 2019-08-18 NOTE — Progress Notes (Signed)
Covid-19 Vaccination Clinic  Name:  Cindy Simpson    MRN: [id number] DOB: 1946-06-21  08/18/2019  [name] was observed post Covid-19 immunization for 15 minutes without incidence. She was provided with Vaccine Information Sheet and instruction to access the V-Safe system.   [name] was instructed to call 911 with any severe reactions post vaccine: Marland Kitchen Difficulty breathing  . Swelling of your face and throat  . A fast heartbeat  . A bad rash all over your body  . Dizziness and weakness    Immunizations Administered    Name Date Dose VIS Date Route   Pfizer COVID-19 Vaccine 08/18/2019  8:48 AM 0.3 mL 06/29/2019 Intramuscular   Manufacturer: [employer], [employer]   Lot: [id number]   NDC: [address]-2

## 2019-08-20 NOTE — Progress Notes (Signed)
Subjective: 74 year old female presents the office today for evaluation of painful calluses as well as toenails.  She denies any redness or drainage or any swelling to the area and she has no new concerns. Denies any systemic complaints such as fevers, chills, nausea, vomiting. No acute changes since last appointment, and no other complaints at this time.   Objective: AAO x3, NAD DP/PT pulses palpable bilaterally, CRT less than 3 seconds Hyperkeratotic lesion submetatarsal 1, 3, 5 bilaterally.  No ongoing ulceration drainage or signs of infection.  The nails are hypertrophic, dystrophic, discolored but no pain in the nails no redness or drainage.  No open lesions or pre-ulcerative lesions.  No pain with calf compression, swelling, warmth, erythema  Assessment: Hyperkeratotic lesions, onychomycosis  Plan: -All treatment options discussed with the patient including all alternatives, risks, complications.  -As a courtesy I debrided the calluses x6 without complications or bleeding -Nails debrided x2 without complications or bleeding antifungal. -Discussed daily foot inspection  RTC 3 months or sooner if needed  Trula Slade DPM

## 2019-08-30 ENCOUNTER — Other Ambulatory Visit: Payer: Self-pay | Admitting: Internal Medicine

## 2019-10-01 ENCOUNTER — Other Ambulatory Visit: Payer: Self-pay | Admitting: Internal Medicine

## 2019-10-01 DIAGNOSIS — R0609 Other forms of dyspnea: Secondary | ICD-10-CM | POA: Diagnosis not present

## 2019-10-01 DIAGNOSIS — I10 Essential (primary) hypertension: Secondary | ICD-10-CM | POA: Diagnosis not present

## 2019-10-01 DIAGNOSIS — E785 Hyperlipidemia, unspecified: Secondary | ICD-10-CM | POA: Diagnosis not present

## 2019-10-01 DIAGNOSIS — I2510 Atherosclerotic heart disease of native coronary artery without angina pectoris: Secondary | ICD-10-CM | POA: Diagnosis not present

## 2019-10-01 DIAGNOSIS — I5181 Takotsubo syndrome: Secondary | ICD-10-CM | POA: Diagnosis not present

## 2019-10-02 DIAGNOSIS — I10 Essential (primary) hypertension: Secondary | ICD-10-CM | POA: Diagnosis not present

## 2019-10-02 DIAGNOSIS — E039 Hypothyroidism, unspecified: Secondary | ICD-10-CM | POA: Diagnosis not present

## 2019-10-02 DIAGNOSIS — E785 Hyperlipidemia, unspecified: Secondary | ICD-10-CM | POA: Diagnosis not present

## 2019-11-08 ENCOUNTER — Ambulatory Visit: Payer: Medicare Other | Admitting: Podiatry

## 2019-11-27 ENCOUNTER — Ambulatory Visit: Payer: Medicare Other | Admitting: Podiatry

## 2019-11-27 ENCOUNTER — Other Ambulatory Visit: Payer: Self-pay

## 2019-11-27 DIAGNOSIS — B351 Tinea unguium: Secondary | ICD-10-CM

## 2019-11-27 DIAGNOSIS — Q828 Other specified congenital malformations of skin: Secondary | ICD-10-CM | POA: Diagnosis not present

## 2019-11-27 DIAGNOSIS — E1149 Type 2 diabetes mellitus with other diabetic neurological complication: Secondary | ICD-10-CM | POA: Diagnosis not present

## 2019-11-27 DIAGNOSIS — M79675 Pain in left toe(s): Secondary | ICD-10-CM | POA: Diagnosis not present

## 2019-11-27 DIAGNOSIS — M79674 Pain in right toe(s): Secondary | ICD-10-CM | POA: Diagnosis not present

## 2019-12-02 NOTE — Progress Notes (Signed)
Subjective: 74 year old female presents the office today for evaluation of painful calluses as well as toenails.  She denies any redness or drainage or any swelling to the callus or toenail sites.  No new concerns  Objective: AAO x3, NAD DP/PT pulses palpable bilaterally, CRT less than 3 seconds Hyperkeratotic lesion submetatarsal 1, 3, 5 bilaterally.  No ongoing ulceration drainage or signs of infection.  The nails are hypertrophic, dystrophic, discolored but no pain in the nails no redness or drainage.  No open lesions or pre-ulcerative lesions.  No pain with calf compression, swelling, warmth, erythema  Assessment: Hyperkeratotic lesions, onychomycosis  Plan: -All treatment options discussed with the patient including all alternatives, risks, complications.  -As a courtesy I debrided the calluses x6 without complications or bleeding -Nails debrided x2 without complications or bleeding antifungal. -Discussed daily foot inspection  RTC 3 months or sooner if needed  Trula Slade DPM

## 2019-12-14 DIAGNOSIS — R079 Chest pain, unspecified: Secondary | ICD-10-CM | POA: Diagnosis not present

## 2019-12-21 ENCOUNTER — Other Ambulatory Visit: Payer: Self-pay | Admitting: Internal Medicine

## 2019-12-24 DIAGNOSIS — I2510 Atherosclerotic heart disease of native coronary artery without angina pectoris: Secondary | ICD-10-CM | POA: Diagnosis not present

## 2019-12-24 DIAGNOSIS — I5181 Takotsubo syndrome: Secondary | ICD-10-CM | POA: Diagnosis not present

## 2019-12-24 DIAGNOSIS — I10 Essential (primary) hypertension: Secondary | ICD-10-CM | POA: Diagnosis not present

## 2019-12-24 DIAGNOSIS — E785 Hyperlipidemia, unspecified: Secondary | ICD-10-CM | POA: Diagnosis not present

## 2019-12-24 DIAGNOSIS — E039 Hypothyroidism, unspecified: Secondary | ICD-10-CM | POA: Diagnosis not present

## 2019-12-24 DIAGNOSIS — R0789 Other chest pain: Secondary | ICD-10-CM | POA: Diagnosis not present

## 2020-01-23 ENCOUNTER — Encounter: Payer: Self-pay | Admitting: Podiatry

## 2020-01-28 ENCOUNTER — Encounter: Payer: Self-pay | Admitting: Internal Medicine

## 2020-01-28 ENCOUNTER — Other Ambulatory Visit: Payer: Self-pay

## 2020-01-28 ENCOUNTER — Ambulatory Visit (INDEPENDENT_AMBULATORY_CARE_PROVIDER_SITE_OTHER): Payer: Medicare Other | Admitting: Internal Medicine

## 2020-01-28 DIAGNOSIS — E876 Hypokalemia: Secondary | ICD-10-CM | POA: Diagnosis not present

## 2020-01-28 DIAGNOSIS — E034 Atrophy of thyroid (acquired): Secondary | ICD-10-CM

## 2020-01-28 DIAGNOSIS — N183 Chronic kidney disease, stage 3 unspecified: Secondary | ICD-10-CM | POA: Diagnosis not present

## 2020-01-28 DIAGNOSIS — I2510 Atherosclerotic heart disease of native coronary artery without angina pectoris: Secondary | ICD-10-CM | POA: Diagnosis not present

## 2020-01-28 DIAGNOSIS — I10 Essential (primary) hypertension: Secondary | ICD-10-CM | POA: Diagnosis not present

## 2020-01-28 NOTE — Assessment & Plan Note (Signed)
Coreg, Diovan

## 2020-01-28 NOTE — Assessment & Plan Note (Signed)
Hydrate well 

## 2020-01-28 NOTE — Assessment & Plan Note (Signed)
Lovastatin, ASA 

## 2020-01-28 NOTE — Progress Notes (Signed)
Subjective:  Patient ID: Cindy Simpson, female    DOB: 21-Dec-1945  Age: 74 y.o. MRN: [id number]  CC: No chief complaint on file.   HPI EARNSTINE KROUT presents for dyslipidemia, hypothyroidism, CAD f/u She is on a higher Levothyroxine dose  Outpatient Medications Prior to Visit  Medication Sig Dispense Refill  . aspirin (BAYER ASPIRIN) 325 MG tablet Take 0.5 tablets (162 mg total) by mouth daily. (Patient taking differently: Take 81 mg by mouth daily. ) 100 tablet 3  . bismuth subsalicylate (PEPTO BISMOL) 262 MG/15ML suspension Take 30 mLs by mouth every 6 (six) hours as needed for indigestion.    . carvedilol (COREG) 6.25 MG tablet TAKE 1 TABLET BY MOUTH  TWICE DAILY WITH A MEAL 180 tablet 3  . cetirizine (ZYRTEC) 10 MG tablet Take 10 mg by mouth at bedtime.    . Cholecalciferol 1000 UNITS tablet Take 1,000 Units by mouth daily after supper.     . diclofenac sodium (VOLTAREN) 1 % GEL Apply 2 g topically 4 (four) times daily. 100 g 3  . glucose blood (ACCU-CHEK AVIVA) test strip Use to check blood sugars twice a day 100 each 5  . Lancets (ACCU-CHEK SOFT TOUCH) lancets Use to check blood sugars twice a day 100 each 5  . levothyroxine (SYNTHROID) 100 MCG tablet Take 100 mcg by mouth daily.    Marland Kitchen levothyroxine (SYNTHROID) 75 MCG tablet TAKE 1 TABLET BY MOUTH  DAILY 90 tablet 3  . lovastatin (MEVACOR) 10 MG tablet TAKE 1 TABLET BY MOUTH  DAILY 90 tablet 3  . Multiple Vitamin (MULTIVITAMIN WITH MINERALS) TABS tablet Take 1 tablet by mouth every evening.     . pantoprazole (PROTONIX) 40 MG tablet TAKE 1 TABLET BY MOUTH  TWICE DAILY 180 tablet 3  . TURMERIC PO Take 1 tablet by mouth daily.     . valsartan (DIOVAN) 80 MG tablet TAKE 1 TABLET BY MOUTH  DAILY 90 tablet 3  . gabapentin (NEURONTIN) 100 MG capsule Take 1 capsule (100 mg total) by mouth at bedtime. (Patient not taking: Reported on 01/28/2020) 90 capsule 0   Facility-Administered Medications Prior to Visit  Medication Dose Route  Frequency Provider Last Rate Last Admin  . 0.9 %  sodium chloride infusion  500 mL Intravenous Once Charlie Pitter III, MD        ROS: Review of Systems  Constitutional: Positive for unexpected weight change. Negative for activity change, appetite change, chills and fatigue.  HENT: Negative for congestion, mouth sores and sinus pressure.   Eyes: Negative for visual disturbance.  Respiratory: Negative for cough and chest tightness.   Gastrointestinal: Negative for abdominal pain and nausea.  Genitourinary: Negative for difficulty urinating, frequency and vaginal pain.  Musculoskeletal: Negative for back pain and gait problem.  Skin: Negative for pallor and rash.  Neurological: Negative for dizziness, tremors, weakness, numbness and headaches.  Psychiatric/Behavioral: Negative for confusion and sleep disturbance.    Objective:  BP 110/70 (BP Location: Left Arm, Patient Position: Sitting, Cuff Size: Normal)   Pulse 66   Temp 98.4 F (36.9 C) (Oral)   Ht 5\' 1"  (1.549 m)   Wt 151 lb (68.5 kg)   SpO2 97%   BMI 28.53 kg/m   BP Readings from Last 3 Encounters:  01/28/20 110/70  07/30/19 126/74  04/04/19 124/82    Wt Readings from Last 3 Encounters:  01/28/20 151 lb (68.5 kg)  07/30/19 156 lb (70.8 kg)  04/04/19 155 lb (70.3 kg)  Physical Exam Constitutional:      General: She is not in acute distress.    Appearance: She is well-developed.  HENT:     Head: Normocephalic.     Right Ear: External ear normal.     Left Ear: External ear normal.     Nose: Nose normal.  Eyes:     General:        Right eye: No discharge.        Left eye: No discharge.     Conjunctiva/sclera: Conjunctivae normal.     Pupils: Pupils are equal, round, and reactive to light.  Neck:     Thyroid: No thyromegaly.     Vascular: No JVD.     Trachea: No tracheal deviation.  Cardiovascular:     Rate and Rhythm: Normal rate and regular rhythm.     Heart sounds: Normal heart sounds.  Pulmonary:      Effort: No respiratory distress.     Breath sounds: No stridor. No wheezing.  Abdominal:     General: Bowel sounds are normal. There is no distension.     Palpations: Abdomen is soft. There is no mass.     Tenderness: There is no abdominal tenderness. There is no guarding or rebound.  Musculoskeletal:        General: No tenderness.     Cervical back: Normal range of motion and neck supple.  Lymphadenopathy:     Cervical: No cervical adenopathy.  Skin:    Findings: No erythema or rash.  Neurological:     Cranial Nerves: No cranial nerve deficit.     Motor: No abnormal muscle tone.     Coordination: Coordination normal.     Deep Tendon Reflexes: Reflexes normal.  Psychiatric:        Behavior: Behavior normal.        Thought Content: Thought content normal.        Judgment: Judgment normal.     Lab Results  Component Value Date   WBC 9.5 03/28/2019   HGB 11.5 (L) 03/28/2019   HCT 36.5 03/28/2019   PLT 292 03/28/2019   GLUCOSE 91 07/30/2019   CHOL 186 01/24/2019   TRIG 125.0 01/24/2019   HDL 55.10 01/24/2019   LDLDIRECT 137.5 03/17/2009   LDLCALC 106 (H) 01/24/2019   ALT 13 03/28/2019   AST 21 03/28/2019   NA 136 07/30/2019   K 4.3 07/30/2019   CL 101 07/30/2019   CREATININE 0.90 07/30/2019   BUN 20 07/30/2019   CO2 29 07/30/2019   TSH 4.26 01/24/2019   INR 0.99 03/03/2016   HGBA1C 5.9 07/30/2019    CT ABDOMEN PELVIS W CONTRAST  Result Date: 03/28/2019 CLINICAL DATA:  Acute generalized abdominal pain. EXAM: CT ABDOMEN AND PELVIS WITH CONTRAST TECHNIQUE: Multidetector CT imaging of the abdomen and pelvis was performed using the standard protocol following bolus administration of intravenous contrast. CONTRAST:  OMNIPAQUE IOHEXOL 300 MG/ML  SOLN COMPARISON:  CT scan of July 06, 2018. FINDINGS: Lower chest: No acute abnormality. Hepatobiliary: No gallstones are noted. Dilated gallbladder is noted. No biliary dilatation is noted. No hepatic abnormality is noted.  Pancreas: Unremarkable. No pancreatic ductal dilatation or surrounding inflammatory changes. Spleen: Normal in size without focal abnormality. Adrenals/Urinary Tract: Adrenal glands appear normal. Stable large right renal cyst is noted. No hydronephrosis or renal obstruction is noted. No renal or ureteral calculi are noted. Urinary bladder is unremarkable. Stomach/Bowel: Status post hiatal hernia repair. There is no evidence of bowel obstruction or  inflammation. The appendix appears normal. Diverticulosis of descending and sigmoid colon is noted without inflammation. Vascular/Lymphatic: Aortic atherosclerosis. No enlarged abdominal or pelvic lymph nodes. Reproductive: Status post hysterectomy. No adnexal masses. Other: Small fat containing periumbilical and epigastric ventral hernias are noted. No ascites is noted. Musculoskeletal: No acute or significant osseous findings. IMPRESSION: Dilated gallbladder is noted without inflammation or cholelithiasis. No biliary dilatation or hepatic abnormality is noted. Diverticulosis of descending and sigmoid colon is noted without inflammation. Small fat containing periumbilical and epigastric ventral hernias are noted. Aortic Atherosclerosis (ICD10-I70.0). Electronically Signed   By: Lupita Raider M.D.   On: 03/28/2019 15:22    Assessment & Plan:    [name], MD

## 2020-01-28 NOTE — Assessment & Plan Note (Signed)
Labs in 2-6 wks

## 2020-01-28 NOTE — Assessment & Plan Note (Signed)
Labs

## 2020-02-18 ENCOUNTER — Other Ambulatory Visit: Payer: Medicare Other

## 2020-02-18 ENCOUNTER — Other Ambulatory Visit: Payer: Self-pay

## 2020-02-18 DIAGNOSIS — E034 Atrophy of thyroid (acquired): Secondary | ICD-10-CM | POA: Diagnosis not present

## 2020-02-18 DIAGNOSIS — E876 Hypokalemia: Secondary | ICD-10-CM

## 2020-02-19 LAB — BASIC METABOLIC PANEL
BUN: 22 mg/dL (ref 7–25)
CO2: 28 mmol/L (ref 20–32)
Calcium: 9.7 mg/dL (ref 8.6–10.4)
Chloride: 100 mmol/L (ref 98–110)
Creat: 0.91 mg/dL (ref 0.60–0.93)
Glucose, Bld: 85 mg/dL (ref 65–99)
Potassium: 4.7 mmol/L (ref 3.5–5.3)
Sodium: 136 mmol/L (ref 135–146)

## 2020-02-19 LAB — TSH: TSH: 5.56 mIU/L — ABNORMAL HIGH (ref 0.40–4.50)

## 2020-02-19 LAB — T4, FREE: Free T4: 1.4 ng/dL (ref 0.8–1.8)

## 2020-02-28 ENCOUNTER — Other Ambulatory Visit: Payer: Self-pay

## 2020-02-28 ENCOUNTER — Ambulatory Visit: Payer: Medicare Other | Admitting: Podiatry

## 2020-02-28 DIAGNOSIS — B351 Tinea unguium: Secondary | ICD-10-CM | POA: Diagnosis not present

## 2020-02-28 DIAGNOSIS — Q828 Other specified congenital malformations of skin: Secondary | ICD-10-CM | POA: Diagnosis not present

## 2020-02-28 DIAGNOSIS — M79675 Pain in left toe(s): Secondary | ICD-10-CM

## 2020-02-28 DIAGNOSIS — M79674 Pain in right toe(s): Secondary | ICD-10-CM

## 2020-02-28 DIAGNOSIS — E1149 Type 2 diabetes mellitus with other diabetic neurological complication: Secondary | ICD-10-CM

## 2020-02-29 NOTE — Progress Notes (Signed)
Subjective: 74 year old female presents the office today for evaluation of painful calluses as well as toenails. No redness/drainage to the toenail sites.  She denies any redness or drainage or any swelling to the callus or toenail sites.  No new concerns  Objective: AAO x3, NAD DP/PT pulses palpable bilaterally, CRT less than 3 seconds Hyperkeratotic lesion submetatarsal 1, 3, 5 bilaterally.  No ongoing ulceration drainage or signs of infection.   The nails are hypertrophic, dystrophic, discolored x 10 and are causing irritation/pain. No redness, swelling, drainage.  No open lesions or pre-ulcerative lesions.  No pain with calf compression, swelling, warmth, erythema  Assessment: Hyperkeratotic lesions, symptomatic onychomycosis  Plan: -All treatment options discussed with the patient including all alternatives, risks, complications.  -Debrided the calluses x6 without complications or bleeding -Nails debrided Y17 without complications or bleeding. -Discussed daily foot inspection  RTC 3 months or sooner if needed  Cindy Simpson DPM

## 2020-03-31 DIAGNOSIS — E785 Hyperlipidemia, unspecified: Secondary | ICD-10-CM | POA: Diagnosis not present

## 2020-03-31 DIAGNOSIS — I5181 Takotsubo syndrome: Secondary | ICD-10-CM | POA: Diagnosis not present

## 2020-03-31 DIAGNOSIS — I2510 Atherosclerotic heart disease of native coronary artery without angina pectoris: Secondary | ICD-10-CM | POA: Diagnosis not present

## 2020-03-31 DIAGNOSIS — I10 Essential (primary) hypertension: Secondary | ICD-10-CM | POA: Diagnosis not present

## 2020-04-01 ENCOUNTER — Ambulatory Visit: Payer: Medicare Other | Attending: Internal Medicine

## 2020-04-01 DIAGNOSIS — Z23 Encounter for immunization: Secondary | ICD-10-CM

## 2020-04-01 NOTE — Progress Notes (Signed)
Covid-19 Vaccination Clinic  Name:  Cindy Simpson    MRN: [id number] DOB: 1945/11/14  04/01/2020  [name] was observed post Covid-19 immunization for 15 minutes without incident. She was provided with Vaccine Information Sheet and instruction to access the V-Safe system.   [name] was instructed to call 911 with any severe reactions post vaccine: Marland Kitchen Difficulty breathing  . Swelling of face and throat  . A fast heartbeat  . A bad rash all over body  . Dizziness and weakness

## 2020-04-25 DIAGNOSIS — Z1231 Encounter for screening mammogram for malignant neoplasm of breast: Secondary | ICD-10-CM | POA: Diagnosis not present

## 2020-04-25 LAB — HM MAMMOGRAPHY

## 2020-04-29 ENCOUNTER — Encounter: Payer: Self-pay | Admitting: Internal Medicine

## 2020-04-30 ENCOUNTER — Encounter: Payer: Self-pay | Admitting: Gastroenterology

## 2020-04-30 ENCOUNTER — Ambulatory Visit: Payer: Medicare Other | Admitting: Gastroenterology

## 2020-04-30 VITALS — BP 110/64 | HR 77 | Ht 61.0 in | Wt 153.2 lb

## 2020-04-30 DIAGNOSIS — K5909 Other constipation: Secondary | ICD-10-CM

## 2020-04-30 NOTE — Patient Instructions (Signed)
If you are age 75 or older, your body mass index should be between 23-30. Your Body mass index is 28.96 kg/m. If this is out of the aforementioned range listed, please consider follow up with your Primary Care Provider.  If you are age 98 or younger, your body mass index should be between 19-25. Your Body mass index is 28.96 kg/m. If this is out of the aformentioned range listed, please consider follow up with your Primary Care Provider.   Follow up as needed.   It was a pleasure to see you today!  Dr. Loletha Carrow

## 2020-04-30 NOTE — Progress Notes (Signed)
[facility] Progress Note  Chief Complaint: Chronic constipation and abdominal pain  Subjective  History: Cindy Simpson was last seen July 2020 for a surveillance colonoscopy.  She had previously had piecemeal resection of a cecal adenoma with suboptimal prep in April 2019.  On the 2020 exam, her prep was good, diminutive left colon adenoma removed.  As before, she has a markedly tortuous and redundant left colon with severe diverticulosis, making scope passage quite challenging.  Based on all that, I did not recommend a future surveillance colonoscopy.  Years before that I saw Cindy Simpson for persistent reflux and dysphagia with a large hiatal hernia that underwent surgical repair, and she had some dysphagia from dysmotility afterwards.  Last EGD June 2017 ________________________   Cindy Simpson wanted to see me for some symptoms that were bothering her over the summer and to a lesser degree continue now.  It was difficult to get a clear description of her symptoms, but over the summer when she would work in her garden she would periodically feel "bad overall" which seems to mean fatigued and lightheaded, especially when she would stand up after gardening.  She was also having some intermittent crampy lower abdominal pain before bowel movements.  On a couple occasions when she was straining for bowel movements she felt lightheaded and "like the lights were going to go out".  She will take occasional MiraLAX but says she tries not to do that and prefers just to "wait it out".  ROS: Cardiovascular:  no chest pain Respiratory: no dyspnea  The patient's Past Medical, Family and Social History were reviewed and are on file in the EMR.  Objective:  Med list reviewed  Current Outpatient Medications:  .  Aspirin 81 MG CAPS, Take by mouth., Disp: , Rfl:  .  bismuth subsalicylate (PEPTO BISMOL) 262 MG/15ML suspension, Take 30 mLs by mouth every 6 (six) hours as needed for indigestion., Disp: , Rfl:  .   carvedilol (COREG) 6.25 MG tablet, TAKE 1 TABLET BY MOUTH  TWICE DAILY WITH A MEAL, Disp: 180 tablet, Rfl: 3 .  cetirizine (ZYRTEC) 10 MG tablet, Take 10 mg by mouth at bedtime., Disp: , Rfl:  .  Cholecalciferol 1000 UNITS tablet, Take 1,000 Units by mouth daily after supper. , Disp: , Rfl:  .  diclofenac sodium (VOLTAREN) 1 % GEL, Apply 2 g topically 4 (four) times daily., Disp: 100 g, Rfl: 3 .  glucose blood (ACCU-CHEK AVIVA) test strip, Use to check blood sugars twice a day, Disp: 100 each, Rfl: 5 .  Lancets (ACCU-CHEK SOFT TOUCH) lancets, Use to check blood sugars twice a day, Disp: 100 each, Rfl: 5 .  levothyroxine (SYNTHROID) 100 MCG tablet, Take 100 mcg by mouth daily., Disp: , Rfl:  .  levothyroxine (SYNTHROID) 75 MCG tablet, TAKE 1 TABLET BY MOUTH  DAILY, Disp: 90 tablet, Rfl: 3 .  lovastatin (MEVACOR) 10 MG tablet, TAKE 1 TABLET BY MOUTH  DAILY, Disp: 90 tablet, Rfl: 3 .  pantoprazole (PROTONIX) 40 MG tablet, TAKE 1 TABLET BY MOUTH  TWICE DAILY, Disp: 180 tablet, Rfl: 3 .  TURMERIC PO, Take 1 tablet by mouth daily. , Disp: , Rfl:  .  valsartan (DIOVAN) 80 MG tablet, TAKE 1 TABLET BY MOUTH  DAILY, Disp: 90 tablet, Rfl: 3  Current Facility-Administered Medications:  .  0.9 %  sodium chloride infusion, 500 mL, Intravenous, Once, Danis, Estill Cotta III, MD   Vital signs in last 24 hrs: Vitals:   04/30/20 [id number]  BP: 110/64  Pulse: 77    Physical Exam  Well-appearing  HEENT: sclera anicteric, oral mucosa moist without lesions  Neck: supple, no thyromegaly, JVD or lymphadenopathy  Cardiac: RRR without murmurs, S1S2 heard, no peripheral edema  Pulm: clear to auscultation bilaterally, normal RR and effort noted  Abdomen: soft, no tenderness, with active bowel sounds. No guarding or palpable hepatosplenomegaly.  Skin; warm and dry, no jaundice or rash  Labs:   ___________________________________________ Radiologic  studies:   ____________________________________________ Other:   _____________________________________________ Assessment & Plan  Assessment: Encounter Diagnosis  Name Primary?  . Chronic constipation Yes   It sounds like she was probably getting fatigued and volume depleted working outside in the summer and then getting presyncopal at times.  Her episodes on the toilet during straining also sound presyncopal as well from increased vagal tone.  I reassured her, recommended she stay hydrated when working outside, try to make position changes slowly and take half capful of MiraLAX every day to every other day to relieve constipation and avoid straining.  I will see her again as needed.  20 minutes were spent on this encounter (including chart review, history/exam, counseling/coordination of care, and documentation)  [name]

## 2020-05-06 ENCOUNTER — Ambulatory Visit: Payer: Medicare Other | Admitting: Internal Medicine

## 2020-05-09 ENCOUNTER — Encounter: Payer: Self-pay | Admitting: Internal Medicine

## 2020-05-09 ENCOUNTER — Other Ambulatory Visit: Payer: Self-pay

## 2020-05-09 ENCOUNTER — Ambulatory Visit (INDEPENDENT_AMBULATORY_CARE_PROVIDER_SITE_OTHER): Payer: Medicare Other | Admitting: Internal Medicine

## 2020-05-09 DIAGNOSIS — N183 Chronic kidney disease, stage 3 unspecified: Secondary | ICD-10-CM

## 2020-05-09 DIAGNOSIS — E785 Hyperlipidemia, unspecified: Secondary | ICD-10-CM | POA: Diagnosis not present

## 2020-05-09 DIAGNOSIS — I2510 Atherosclerotic heart disease of native coronary artery without angina pectoris: Secondary | ICD-10-CM | POA: Diagnosis not present

## 2020-05-09 DIAGNOSIS — K219 Gastro-esophageal reflux disease without esophagitis: Secondary | ICD-10-CM

## 2020-05-09 DIAGNOSIS — Z0000 Encounter for general adult medical examination without abnormal findings: Secondary | ICD-10-CM

## 2020-05-09 DIAGNOSIS — R1084 Generalized abdominal pain: Secondary | ICD-10-CM | POA: Diagnosis not present

## 2020-05-09 MED ORDER — FAMOTIDINE 20 MG PO TABS: 20.0000 mg | ORAL_TABLET | Freq: Every day | ORAL | 3 refills | Status: DC

## 2020-05-09 NOTE — Assessment & Plan Note (Signed)
No CP 

## 2020-05-09 NOTE — Assessment & Plan Note (Signed)
Worse Try gluten free diet Added Pepcid Celiac panel, food IgEs tests

## 2020-05-09 NOTE — Assessment & Plan Note (Signed)
No NSAIDs 

## 2020-05-09 NOTE — Progress Notes (Signed)
Subjective:  Patient ID: Cindy Simpson, female    DOB: [birth year]  Age: 74 y.o. MRN: [id number]  CC: Follow-up   HPI [name] presents for heartburn at night. C/o a lot of gut problems She just saw Dr Myrtie Neither, GI. F/u hypothyroidism, dyslipidemia Well exam  Outpatient Medications Prior to Visit  Medication Sig Dispense Refill  . Aspirin 81 MG CAPS Take by mouth.    . bismuth subsalicylate (PEPTO BISMOL) 262 MG/15ML suspension Take 30 mLs by mouth every 6 (six) hours as needed for indigestion.    . carvedilol (COREG) 6.25 MG tablet TAKE 1 TABLET BY MOUTH  TWICE DAILY WITH A MEAL 180 tablet 3  . cetirizine (ZYRTEC) 10 MG tablet Take 10 mg by mouth at bedtime.    . Cholecalciferol 1000 UNITS tablet Take 1,000 Units by mouth daily after supper.     . diclofenac sodium (VOLTAREN) 1 % GEL Apply 2 g topically 4 (four) times daily. 100 g 3  . glucose blood (ACCU-CHEK AVIVA) test strip Use to check blood sugars twice a day 100 each 5  . Lancets (ACCU-CHEK SOFT TOUCH) lancets Use to check blood sugars twice a day 100 each 5  . levothyroxine (SYNTHROID) 100 MCG tablet Take 100 mcg by mouth daily.    Marland Kitchen levothyroxine (SYNTHROID) 75 MCG tablet TAKE 1 TABLET BY MOUTH  DAILY 90 tablet 3  . lovastatin (MEVACOR) 10 MG tablet TAKE 1 TABLET BY MOUTH  DAILY 90 tablet 3  . pantoprazole (PROTONIX) 40 MG tablet TAKE 1 TABLET BY MOUTH  TWICE DAILY 180 tablet 3  . TURMERIC PO Take 1 tablet by mouth daily.     . valsartan (DIOVAN) 80 MG tablet TAKE 1 TABLET BY MOUTH  DAILY 90 tablet 3   Facility-Administered Medications Prior to Visit  Medication Dose Route Frequency Provider Last Rate Last Admin  . 0.9 %  sodium chloride infusion  500 mL Intravenous Once Danis, Andreas Blower, MD        ROS: Review of Systems  Constitutional: Positive for fatigue. Negative for activity change, appetite change, chills and unexpected weight change.  HENT: Negative for congestion, mouth sores and sinus pressure.     Eyes: Negative for visual disturbance.  Respiratory: Negative for cough and chest tightness.   Gastrointestinal: Negative for abdominal pain and nausea.  Genitourinary: Negative for difficulty urinating, frequency and vaginal pain.  Musculoskeletal: Negative for back pain and gait problem.  Skin: Negative for pallor and rash.  Neurological: Negative for dizziness, tremors, weakness, numbness and headaches.  Psychiatric/Behavioral: Negative for confusion and sleep disturbance.    Objective:  BP 120/78 (BP Location: Right Arm, Patient Position: Sitting, Cuff Size: Large)   Pulse 74   Temp 98.2 F (36.8 C) (Oral)   Wt 152 lb (68.9 kg)   SpO2 97%   BMI 28.72 kg/m   BP Readings from Last 3 Encounters:  05/09/20 120/78  04/30/20 110/64  01/28/20 110/70    Wt Readings from Last 3 Encounters:  05/09/20 152 lb (68.9 kg)  04/30/20 153 lb 4 oz (69.5 kg)  01/28/20 151 lb (68.5 kg)    Physical Exam Constitutional:      General: She is not in acute distress.    Appearance: She is well-developed. She is obese.  HENT:     Head: Normocephalic.     Right Ear: External ear normal.     Left Ear: External ear normal.     Nose: Nose normal.  Eyes:  General:        Right eye: No discharge.        Left eye: No discharge.     Conjunctiva/sclera: Conjunctivae normal.     Pupils: Pupils are equal, round, and reactive to light.  Neck:     Thyroid: No thyromegaly.     Vascular: No JVD.     Trachea: No tracheal deviation.  Cardiovascular:     Rate and Rhythm: Normal rate and regular rhythm.     Heart sounds: Normal heart sounds.  Pulmonary:     Effort: No respiratory distress.     Breath sounds: No stridor. No wheezing.  Abdominal:     General: Bowel sounds are normal. There is no distension.     Palpations: Abdomen is soft. There is no mass.     Tenderness: There is no abdominal tenderness. There is no guarding or rebound.  Musculoskeletal:        General: No tenderness.      Cervical back: Normal range of motion and neck supple.  Lymphadenopathy:     Cervical: No cervical adenopathy.  Skin:    Findings: No erythema or rash.  Neurological:     Cranial Nerves: No cranial nerve deficit.     Motor: No abnormal muscle tone.     Coordination: Coordination normal.     Deep Tendon Reflexes: Reflexes normal.  Psychiatric:        Behavior: Behavior normal.        Thought Content: Thought content normal.        Judgment: Judgment normal.     Lab Results  Component Value Date   WBC 9.5 03/28/2019   HGB 11.5 (L) 03/28/2019   HCT 36.5 03/28/2019   PLT 292 03/28/2019   GLUCOSE 85 02/18/2020   CHOL 186 01/24/2019   TRIG 125.0 01/24/2019   HDL 55.10 01/24/2019   LDLDIRECT 137.5 03/17/2009   LDLCALC 106 (H) 01/24/2019   ALT 13 03/28/2019   AST 21 03/28/2019   NA 136 02/18/2020   K 4.7 02/18/2020   CL 100 02/18/2020   CREATININE 0.91 02/18/2020   BUN 22 02/18/2020   CO2 28 02/18/2020   TSH 5.56 (H) 02/18/2020   INR 0.99 03/03/2016   HGBA1C 5.9 07/30/2019    CT ABDOMEN PELVIS W CONTRAST  Result Date: 03/28/2019 CLINICAL DATA:  Acute generalized abdominal pain. EXAM: CT ABDOMEN AND PELVIS WITH CONTRAST TECHNIQUE: Multidetector CT imaging of the abdomen and pelvis was performed using the standard protocol following bolus administration of intravenous contrast. CONTRAST:  OMNIPAQUE IOHEXOL 300 MG/ML  SOLN COMPARISON:  CT scan of July 06, 2018. FINDINGS: Lower chest: No acute abnormality. Hepatobiliary: No gallstones are noted. Dilated gallbladder is noted. No biliary dilatation is noted. No hepatic abnormality is noted. Pancreas: Unremarkable. No pancreatic ductal dilatation or surrounding inflammatory changes. Spleen: Normal in size without focal abnormality. Adrenals/Urinary Tract: Adrenal glands appear normal. Stable large right renal cyst is noted. No hydronephrosis or renal obstruction is noted. No renal or ureteral calculi are noted. Urinary bladder  is unremarkable. Stomach/Bowel: Status post hiatal hernia repair. There is no evidence of bowel obstruction or inflammation. The appendix appears normal. Diverticulosis of descending and sigmoid colon is noted without inflammation. Vascular/Lymphatic: Aortic atherosclerosis. No enlarged abdominal or pelvic lymph nodes. Reproductive: Status post hysterectomy. No adnexal masses. Other: Small fat containing periumbilical and epigastric ventral hernias are noted. No ascites is noted. Musculoskeletal: No acute or significant osseous findings. IMPRESSION: Dilated gallbladder is noted without  inflammation or cholelithiasis. No biliary dilatation or hepatic abnormality is noted. Diverticulosis of descending and sigmoid colon is noted without inflammation. Small fat containing periumbilical and epigastric ventral hernias are noted. Aortic Atherosclerosis (ICD10-I70.0). Electronically Signed   By: Lupita Raider M.D.   On: 03/28/2019 15:22    Assessment & Plan:   There are no diagnoses linked to this encounter.   No orders of the defined types were placed in this encounter.    Follow-up: No follow-ups on file.  [name], MD

## 2020-05-09 NOTE — Assessment & Plan Note (Signed)
Lovastatin 

## 2020-05-09 NOTE — Assessment & Plan Note (Signed)

## 2020-05-13 LAB — ALLERGEN FOOD PROFILE SPECIFIC IGE
Allergen Apple, IgE: <0.1 kU/L
Allergen Corn, IgE: <0.1 kU/L
Allergen Tomato, IgE: <0.1 kU/L
Chicken IgE: <0.1 kU/L
Codfish IgE: <0.1 kU/L
Egg White IgE: <0.1 kU/L
IgE (Immunoglobulin E), Serum: 102 IU/mL (ref 6–495)
Milk IgE: <0.1 kU/L
Orange: <0.1 kU/L
Peanut IgE: <0.1 kU/L
Shrimp IgE: <0.1 kU/L
Soybean IgE: <0.1 kU/L
Tuna: <0.1 kU/L
Wheat IgE: <0.1 kU/L

## 2020-05-13 LAB — GLIADIN ANTIBODIES, SERUM
Gliadin IgA: <1 U/mL
Gliadin IgG: <1 U/mL

## 2020-05-13 LAB — TISSUE TRANSGLUTAMINASE, IGA: (tTG) Ab, IgA: <1 U/mL

## 2020-05-13 LAB — RETICULIN ANTIBODIES, IGA W TITER: Reticulin IgA Screen: NEGATIVE

## 2020-05-29 ENCOUNTER — Ambulatory Visit: Payer: Medicare Other | Admitting: Podiatry

## 2020-05-29 ENCOUNTER — Other Ambulatory Visit: Payer: Self-pay

## 2020-05-29 DIAGNOSIS — Q828 Other specified congenital malformations of skin: Secondary | ICD-10-CM

## 2020-05-29 DIAGNOSIS — M79674 Pain in right toe(s): Secondary | ICD-10-CM | POA: Diagnosis not present

## 2020-05-29 DIAGNOSIS — E1149 Type 2 diabetes mellitus with other diabetic neurological complication: Secondary | ICD-10-CM | POA: Diagnosis not present

## 2020-05-29 DIAGNOSIS — B351 Tinea unguium: Secondary | ICD-10-CM

## 2020-05-29 DIAGNOSIS — M79675 Pain in left toe(s): Secondary | ICD-10-CM

## 2020-05-29 NOTE — Progress Notes (Signed)
Subjective: 74 year old female presents the office today for evaluation of painful calluses as well as toenails. No redness/drainage to the toenail sites. She states she has been on her feet more recently and the calluses started to get more thick.  She denies any redness or drainage or any swelling to the callus or toenail sites.  No new concerns  Objective: AAO x3, NAD DP/PT pulses palpable bilaterally, CRT less than 3 seconds Hyperkeratotic lesion submetatarsal 1, 3, 5 bilaterally.  No ongoing ulceration drainage or signs of infection however on the left foot submetatarsal 3 lesion the area was thicker and had some minimal dried blood present.  Upon debridement the area was "raw" but there is no drainage or any bleeding noted during debridement. The nails are hypertrophic, dystrophic, discolored x 10 and are causing irritation/pain. No redness, swelling, drainage.  No open lesions or pre-ulcerative lesions.  No pain with calf compression, swelling, warmth, erythema  Assessment: Hyperkeratotic lesions/preulcerative calluses, symptomatic onychomycosis  Plan: -All treatment options discussed with the patient including all alternatives, risks, complications.  -Debrided the calluses x6 without complications or bleeding.  Left submetatarsal 3 lesion I cleaned and antibiotic ointment is applied and offloading pads.  Monitor for any signs or symptoms of infection. -Nails debrided H60 without complications or bleeding. -Discussed daily foot inspection  RTC 3 months or sooner if needed  Trula Slade DPM

## 2020-06-11 ENCOUNTER — Other Ambulatory Visit: Payer: Self-pay | Admitting: Internal Medicine

## 2020-06-30 DIAGNOSIS — E039 Hypothyroidism, unspecified: Secondary | ICD-10-CM | POA: Diagnosis not present

## 2020-06-30 DIAGNOSIS — I2510 Atherosclerotic heart disease of native coronary artery without angina pectoris: Secondary | ICD-10-CM | POA: Diagnosis not present

## 2020-06-30 DIAGNOSIS — I10 Essential (primary) hypertension: Secondary | ICD-10-CM | POA: Diagnosis not present

## 2020-06-30 DIAGNOSIS — I5181 Takotsubo syndrome: Secondary | ICD-10-CM | POA: Diagnosis not present

## 2020-06-30 DIAGNOSIS — E785 Hyperlipidemia, unspecified: Secondary | ICD-10-CM | POA: Diagnosis not present

## 2020-07-31 ENCOUNTER — Other Ambulatory Visit: Payer: Self-pay

## 2020-07-31 ENCOUNTER — Ambulatory Visit: Payer: Medicare Other | Admitting: Podiatry

## 2020-07-31 DIAGNOSIS — M79675 Pain in left toe(s): Secondary | ICD-10-CM | POA: Diagnosis not present

## 2020-07-31 DIAGNOSIS — M79674 Pain in right toe(s): Secondary | ICD-10-CM | POA: Diagnosis not present

## 2020-07-31 DIAGNOSIS — B351 Tinea unguium: Secondary | ICD-10-CM | POA: Diagnosis not present

## 2020-07-31 DIAGNOSIS — E1149 Type 2 diabetes mellitus with other diabetic neurological complication: Secondary | ICD-10-CM | POA: Diagnosis not present

## 2020-07-31 DIAGNOSIS — Q828 Other specified congenital malformations of skin: Secondary | ICD-10-CM | POA: Diagnosis not present

## 2020-08-05 NOTE — Progress Notes (Signed)
Subjective: 75 year old female presents the office today for evaluation of painful calluses as well as toenails. No redness/drainage to the toenail or callus areas. Denies any open lesions. She denies any redness or drainage or any swelling to the callus or toenail sites.  No new concerns  Objective: AAO x3, NAD DP/PT pulses palpable bilaterally, CRT less than 3 seconds  Hyperkeratotic lesion submetatarsal 1, 3, 5 bilaterally.  No ongoing ulceration drainage or signs of infection however on the left foot submetatarsal 3 lesion the area was thicker.  There is no underlying ulceration drainage or any signs of infection. The nails are hypertrophic, dystrophic, discolored x 10 and are causing irritation/pain. No redness, swelling, drainage.  No open lesions or pre-ulcerative lesions.  No pain with calf compression, swelling, warmth, erythema  Assessment: Hyperkeratotic lesions/preulcerative calluses, symptomatic onychomycosis  Plan: -All treatment options discussed with the patient including all alternatives, risks, complications.  -Debrided the calluses x6 without complications or bleeding. Left submetatarsal 3 callus appears to be the most symptomatic there is no signs of infection or ulceration today. Offloading pads dispensed and recommend moisturizer daily. -Nails debrided H63 without complications or bleeding. -Discussed daily foot inspection  RTC 3 months or sooner if needed  Trula Slade DPM

## 2020-08-14 ENCOUNTER — Telehealth: Payer: Self-pay | Admitting: Internal Medicine

## 2020-08-14 NOTE — Telephone Encounter (Signed)
LVM for pt to rtn my call to schedule awv with nha. Please schedule this appt if pt calls the office.

## 2020-08-21 ENCOUNTER — Ambulatory Visit (INDEPENDENT_AMBULATORY_CARE_PROVIDER_SITE_OTHER): Payer: Medicare Other

## 2020-08-21 ENCOUNTER — Other Ambulatory Visit: Payer: Self-pay

## 2020-08-21 VITALS — BP 110/80 | HR 78 | Temp 97.5°F | Ht 61.0 in | Wt 152.8 lb

## 2020-08-21 DIAGNOSIS — Z0000 Encounter for general adult medical examination without abnormal findings: Secondary | ICD-10-CM

## 2020-08-21 NOTE — Patient Instructions (Addendum)
Ms. Casserly , Thank you for taking time to come for your Medicare Wellness Visit. I appreciate your ongoing commitment to your health goals. Please review the following plan we discussed and let me know if I can assist you in the future.   Screening recommendations/referrals: Colonoscopy: 01/23/2019; no repeat due to age 75: 04/25/2020 Bone Density: 12/20/2014; due every 2 years Recommended yearly ophthalmology/optometry visit for glaucoma screening and checkup Recommended yearly dental visit for hygiene and checkup  Vaccinations: Influenza vaccine: 04/2020; due every year Pneumococcal vaccine: up to date Tdap vaccine: 01/28/2020; due every 10 years Shingles vaccine: up to date; done at local pharmacy Covid-19: up to date  Advanced directives: Advance directive discussed with you today. Even though you declined this today please call our office should you change your mind and we can give you the proper paperwork for you to fill out.  Conditions/risks identified: Yes; Reviewed health maintenance screenings with patient today and relevant education, vaccines, and/or referrals were provided. Please continue to do your personal lifestyle choices by: daily care of teeth and gums, regular physical activity (goal should be 5 days a week for 30 minutes), eat a healthy diet, avoid tobacco and drug use, limiting any alcohol intake, taking a low-dose aspirin (if not allergic or have been advised by your provider otherwise) and taking vitamins and minerals as recommended by your provider. Continue doing brain stimulating activities (puzzles, reading, adult coloring books, staying active) to keep memory sharp. Continue to eat heart healthy diet (full of fruits, vegetables, whole grains, lean protein, water--limit salt, fat, and sugar intake) and increase physical activity as tolerated.  Next appointment: Please schedule your next Medicare Wellness Visit with your Nurse Health Advisor in 1 year by calling  [phone].   Preventive Care 76 Years and Older, Female Preventive care refers to lifestyle choices and visits with your health care provider that can promote health and wellness. What does preventive care include?  A yearly physical exam. This is also called an annual well check.  Dental exams once or twice a year.  Routine eye exams. Ask your health care provider how often you should have your eyes checked.  Personal lifestyle choices, including:  Daily care of your teeth and gums.  Regular physical activity.  Eating a healthy diet.  Avoiding tobacco and drug use.  Limiting alcohol use.  Practicing safe sex.  Taking low-dose aspirin every day.  Taking vitamin and mineral supplements as recommended by your health care provider. What happens during an annual well check? The services and screenings done by your health care provider during your annual well check will depend on your age, overall health, lifestyle risk factors, and family history of disease. Counseling  Your health care provider may ask you questions about your:  Alcohol use.  Tobacco use.  Drug use.  Emotional well-being.  Home and relationship well-being.  Sexual activity.  Eating habits.  History of falls.  Memory and ability to understand (cognition).  Work and work Statistician.  Reproductive health. Screening  You may have the following tests or measurements:  Height, weight, and BMI.  Blood pressure.  Lipid and cholesterol levels. These may be checked every 5 years, or more frequently if you are over 76 years old.  Skin check.  Lung cancer screening. You may have this screening every year starting at age 16 if you have a 30-pack-year history of smoking and currently smoke or have quit within the past 15 years.  Fecal occult blood test (FOBT) of  the stool. You may have this test every year starting at age 49.  Flexible sigmoidoscopy or colonoscopy. You may have a  sigmoidoscopy every 5 years or a colonoscopy every 10 years starting at age 71.  Hepatitis C blood test.  Hepatitis B blood test.  Sexually transmitted disease (STD) testing.  Diabetes screening. This is done by checking your blood sugar (glucose) after you have not eaten for a while (fasting). You may have this done every 1-3 years.  Bone density scan. This is done to screen for osteoporosis. You may have this done starting at age 6.  Mammogram. This may be done every 1-2 years. Talk to your health care provider about how often you should have regular mammograms. Talk with your health care provider about your test results, treatment options, and if necessary, the need for more tests. Vaccines  Your health care provider may recommend certain vaccines, such as:  Influenza vaccine. This is recommended every year.  Tetanus, diphtheria, and acellular pertussis (Tdap, Td) vaccine. You may need a Td booster every 10 years.  Zoster vaccine. You may need this after age 76.  Pneumococcal 13-valent conjugate (PCV13) vaccine. One dose is recommended after age 76.  Pneumococcal polysaccharide (PPSV23) vaccine. One dose is recommended after age 76. Talk to your health care provider about which screenings and vaccines you need and how often you need them. This information is not intended to replace advice given to you by your health care provider. Make sure you discuss any questions you have with your health care provider. Document Released: 08/01/2015 Document Revised: 03/24/2016 Document Reviewed: 05/06/2015 Elsevier Interactive Patient Education  2017 [place] Prevention in the Home Falls can cause injuries. They can happen to people of all ages. There are many things you can do to make your home safe and to help prevent falls. What can I do on the outside of my home?  Regularly fix the edges of walkways and driveways and fix any cracks.  Remove anything that might make you  trip as you walk through a door, such as a raised step or threshold.  Trim any bushes or trees on the path to your home.  Use bright outdoor lighting.  Clear any walking paths of anything that might make someone trip, such as rocks or tools.  Regularly check to see if handrails are loose or broken. Make sure that both sides of any steps have handrails.  Any raised decks and porches should have guardrails on the edges.  Have any leaves, snow, or ice cleared regularly.  Use sand or salt on walking paths during winter.  Clean up any spills in your garage right away. This includes oil or grease spills. What can I do in the bathroom?  Use night lights.  Install grab bars by the toilet and in the tub and shower. Do not use towel bars as grab bars.  Use non-skid mats or decals in the tub or shower.  If you need to sit down in the shower, use a plastic, non-slip stool.  Keep the floor dry. Clean up any water that spills on the floor as soon as it happens.  Remove soap buildup in the tub or shower regularly.  Attach bath mats securely with double-sided non-slip rug tape.  Do not have throw rugs and other things on the floor that can make you trip. What can I do in the bedroom?  Use night lights.  Make sure that you have a light by your  bed that is easy to reach.  Do not use any sheets or blankets that are too big for your bed. They should not hang down onto the floor.  Have a firm chair that has side arms. You can use this for support while you get dressed.  Do not have throw rugs and other things on the floor that can make you trip. What can I do in the kitchen?  Clean up any spills right away.  Avoid walking on wet floors.  Keep items that you use a lot in easy-to-reach places.  If you need to reach something above you, use a strong step stool that has a grab bar.  Keep electrical cords out of the way.  Do not use floor polish or wax that makes floors slippery. If  you must use wax, use non-skid floor wax.  Do not have throw rugs and other things on the floor that can make you trip. What can I do with my stairs?  Do not leave any items on the stairs.  Make sure that there are handrails on both sides of the stairs and use them. Fix handrails that are broken or loose. Make sure that handrails are as long as the stairways.  Check any carpeting to make sure that it is firmly attached to the stairs. Fix any carpet that is loose or worn.  Avoid having throw rugs at the top or bottom of the stairs. If you do have throw rugs, attach them to the floor with carpet tape.  Make sure that you have a light switch at the top of the stairs and the bottom of the stairs. If you do not have them, ask someone to add them for you. What else can I do to help prevent falls?  Wear shoes that:  Do not have high heels.  Have rubber bottoms.  Are comfortable and fit you well.  Are closed at the toe. Do not wear sandals.  If you use a stepladder:  Make sure that it is fully opened. Do not climb a closed stepladder.  Make sure that both sides of the stepladder are locked into place.  Ask someone to hold it for you, if possible.  Clearly mark and make sure that you can see:  Any grab bars or handrails.  First and last steps.  Where the edge of each step is.  Use tools that help you move around (mobility aids) if they are needed. These include:  Canes.  Walkers.  Scooters.  Crutches.  Turn on the lights when you go into a dark area. Replace any light bulbs as soon as they burn out.  Set up your furniture so you have a clear path. Avoid moving your furniture around.  If any of your floors are uneven, fix them.  If there are any pets around you, be aware of where they are.  Review your medicines with your doctor. Some medicines can make you feel dizzy. This can increase your chance of falling. Ask your doctor what other things that you can do to  help prevent falls. This information is not intended to replace advice given to you by your health care provider. Make sure you discuss any questions you have with your health care provider. Document Released: 05/01/2009 Document Revised: 12/11/2015 Document Reviewed: 08/09/2014 Elsevier Interactive Patient Education  2017 [employer].

## 2020-08-21 NOTE — Progress Notes (Addendum)
Subjective:   Cindy Simpson is a 75 y.o. female who presents for Medicare Annual (Subsequent) preventive examination.  Review of Systems    No ROS. Medicare Wellness Visit. Additional risk factors are reflected in social history. Cardiac Risk Factors include: advanced age (>98men, >70 women);dyslipidemia;family history of premature cardiovascular disease;hypertension     Objective:    Today's Vitals   08/21/20 1153  BP: 110/80  Pulse: 78  Temp: (!) 97.5 F (36.4 C)  SpO2: 97%  Weight: 152 lb 12.8 oz (69.3 kg)  Height: 5\' 1"  (1.549 m)  PainSc: 0-No pain   Body mass index is 28.87 kg/m.  Advanced Directives 08/21/2020 03/28/2019 07/05/2018 04/05/2017 07/20/2016 03/09/2016 03/09/2016  Does Patient Have a Medical Advance Directive? No No No No No No No  Would patient like information on creating a medical advance directive? No - Patient declined - - - - No - patient declined information No - patient declined information  Pre-existing out of facility DNR order (yellow form or pink MOST form) - - - - - - -    Current Medications (verified) Outpatient Encounter Medications as of 08/21/2020  Medication Sig   Aspirin 81 MG CAPS Take by mouth.   bismuth subsalicylate (PEPTO BISMOL) 262 MG/15ML suspension Take 30 mLs by mouth every 6 (six) hours as needed for indigestion.   carvedilol (COREG) 6.25 MG tablet TAKE 1 TABLET BY MOUTH  TWICE DAILY WITH A MEAL   cetirizine (ZYRTEC) 10 MG tablet Take 10 mg by mouth at bedtime.   Cholecalciferol 1000 UNITS tablet Take 1,000 Units by mouth daily after supper.    diclofenac sodium (VOLTAREN) 1 % GEL Apply 2 g topically 4 (four) times daily.   famotidine (PEPCID) 20 MG tablet Take 1 tablet (20 mg total) by mouth at bedtime.   glucose blood (ACCU-CHEK AVIVA) test strip Use to check blood sugars twice a day   Lancets (ACCU-CHEK SOFT TOUCH) lancets Use to check blood sugars twice a day   levothyroxine (SYNTHROID) 100 MCG tablet Take 100 mcg by mouth  daily.   levothyroxine (SYNTHROID) 75 MCG tablet TAKE 1 TABLET BY MOUTH  DAILY   lovastatin (MEVACOR) 10 MG tablet TAKE 1 TABLET BY MOUTH  DAILY   pantoprazole (PROTONIX) 40 MG tablet TAKE 1 TABLET BY MOUTH  TWICE DAILY   TURMERIC PO Take 1 tablet by mouth daily.    valsartan (DIOVAN) 80 MG tablet TAKE 1 TABLET BY MOUTH  DAILY   Facility-Administered Encounter Medications as of 08/21/2020  Medication   0.9 %  sodium chloride infusion    Allergies (verified) Lisinopril, Vinegar [acetic acid], and Metoclopramide hcl   History: Past Medical History:  Diagnosis Date   Allergic rhinitis    Allergy    Anemia    Asthmatic bronchitis 2009   allergies; post nasal drip but not allergies   GERD (gastroesophageal reflux disease)    History of hiatal hernia    Hyperlipidemia    Hypertension    Hypothyroidism    IBS (irritable bowel syndrome)    Insomnia    Irregular heart beat Not sure; long time ago   Periodic irregular heart rate; take verapamil.   Status post dilation of esophageal narrowing    Torticollis 2006   Estimate date; rec'd botox and resolved   Past Surgical History:  Procedure Laterality Date   ABDOMINAL HYSTERECTOMY     with ovaries removed also.   CARDIAC CATHETERIZATION N/A 09/13/2015   Procedure: Left Heart Cath and  Coronary Angiography;  Surgeon: Rinaldo Cloud, MD;  Location: [facility] INVASIVE CV LAB;  Service: Cardiovascular;  Laterality: N/A;   CATARACT EXTRACTION Bilateral 2014   approx 2 years ago   CATARACT EXTRACTION     CLEFT PALATE REPAIR     child   COLONOSCOPY     ESOPHAGOGASTRODUODENOSCOPY (EGD) WITH PROPOFOL N/A 12/30/2015   Procedure: ESOPHAGOGASTRODUODENOSCOPY (EGD) WITH PROPOFOL;  Surgeon: Sherrilyn Rist, MD;  Location: [facility] ENDOSCOPY;  Service: Gastroenterology;  Laterality: N/A;   HIATAL HERNIA REPAIR N/A 03/09/2016   Procedure: LAPAROSCOPIC REPAIR OF HIATAL HERNIA  NISSEN FUNDOPLICATION UPPER ENDOSCOPY;  Surgeon: Avel Peace, MD;  Location: [facility] ORS;   Service: General;  Laterality: N/A;   NISSEN FUNDOPLICATION N/A 03/09/2016   Procedure: NISSEN FUNDOPLICATION;  Surgeon: Avel Peace, MD;  Location: [facility] ORS;  Service: General;  Laterality: N/A;   TUBAL LIGATION Bilateral 1985   UPPER GI ENDOSCOPY  03/09/2016   Procedure: UPPER GI ENDOSCOPY;  Surgeon: Avel Peace, MD;  Location: [facility] ORS;  Service: General;;   Family History  Problem Relation Age of Onset   Hypertension Mother    Heart disease Father    Stroke Father    Stroke Brother        brain aneurism   Colon polyps Sister    Other Sister        c diff-deceased   Stomach cancer Neg Hx    Rectal cancer Neg Hx    Esophageal cancer Neg Hx    Colon cancer Neg Hx    Social History   Socioeconomic History   Marital status: Married    Spouse name: Not on file   Number of children: 0   Years of education: Not on file   Highest education level: Not on file  Occupational History   Occupation: Retired  Tobacco Use   Smoking status: Never Smoker   Smokeless tobacco: Never Used  Building services engineer Use: Never used  Substance and Sexual Activity   Alcohol use: Yes    Alcohol/week: 1.0 - 2.0 standard drink    Types: 1 - 2 Shots of liquor per week    Comment: Perhaps every couple of weeks; not very frequently   Drug use: No   Sexual activity: Never  Other Topics Concern   Not on file  Social History Narrative   Not on file   Social Determinants of Health   Financial Resource Strain: Low Risk    Difficulty of Paying Living Expenses: Not hard at all  Food Insecurity: No Food Insecurity   Worried About Programme researcher, broadcasting/film/video in the Last Year: Never true   Ran Out of Food in the Last Year: Never true  Transportation Needs: No Transportation Needs   Lack of Transportation (Medical): No   Lack of Transportation (Non-Medical): No  Physical Activity: Sufficiently Active   Days of Exercise per Week: 5 days   Minutes of Exercise per Session: 30 min  Stress: No Stress Concern  Present   Feeling of Stress : Not at all  Social Connections: Moderately Isolated   Frequency of Communication with Friends and Family: More than three times a week   Frequency of Social Gatherings with Friends and Family: Once a week   Attends Religious Services: Never   Database administrator or Organizations: No   Attends Banker Meetings: Never   Marital Status: Married    Tobacco Counseling Counseling given: Not Answered   Clinical Intake:  Pre-visit  preparation completed: Yes  Pain : No/denies pain Pain Score: 0-No pain     BMI - recorded: 28.87 Nutritional Status: BMI 25 -29 Overweight Nutritional Risks: None Diabetes: No  How often do you need to have someone help you when you read instructions, pamphlets, or other written materials from your doctor or pharmacy?: 1 - Never What is the last grade level you completed in school?: High School Graduate  Diabetic? no  Interpreter Needed?: No  Information entered by :: Lisette Abu, LPN   Activities of Daily Living In your present state of health, do you have any difficulty performing the following activities: 08/21/2020 05/09/2020  Hearing? N N  Vision? N N  Difficulty concentrating or making decisions? N N  Walking or climbing stairs? N N  Dressing or bathing? N N  Doing errands, shopping? N N  Preparing Food and eating ? N -  Using the Toilet? N -  In the past six months, have you accidently leaked urine? Y -  Do you have problems with loss of bowel control? N -  Managing your Medications? N -  Managing your Finances? N -  Housekeeping or managing your Housekeeping? N -  Some recent data might be hidden    Patient Care Team: [name], Evie Lacks, MD as PCP - General Danis, Kirke Corin, MD as Consulting Physician (Gastroenterology) Charolette Forward, MD as Consulting Physician (Cardiology) Keene Breath., MD as Consulting Physician (Ophthalmology)  Indicate any recent Medical Services you  may have received from other than [facility] providers in the past year (date may be approximate).     Assessment:   This is a routine wellness examination for [place].  Hearing/Vision screen No exam data present  Dietary issues and exercise activities discussed: Current Exercise Habits: Home exercise routine, Type of exercise: walking, Time (Minutes): 30, Frequency (Times/Week): 5, Weekly Exercise (Minutes/Week): 150, Intensity: Moderate, Exercise limited by: cardiac condition(s)  Goals      Exercise 3x per week (30 min per time)     Likes to work in the yard; Is very busy; Is not sedentary; does not sit around; Keeps moving     Weight < 200 lb (90.719 kg)     Weight: goal is to not gain; Likes pasta; eats fruit as desert; Stays mentally happy; low stress; Enjoys to play golf and will attempt to start playing        Depression Screen PHQ 2/9 Scores 08/21/2020 05/09/2020 04/04/2019 01/11/2018 11/29/2016 11/03/2015 06/10/2015  PHQ - 2 Score 0 0 0 0 0 0 0    Fall Risk Fall Risk  08/21/2020 05/09/2020 04/04/2019 01/11/2018 11/29/2016  Falls in the past year? 0 0 0 No No  Number falls in past yr: 0 - - - -  Injury with Fall? 0 0 - - -  Risk for fall due to : No Fall Risks - - - -  Risk for fall due to: Comment - - - - -  Follow up Falls evaluation completed - Falls evaluation completed - -    FALL RISK PREVENTION PERTAINING TO THE HOME:  Any stairs in or around the home? No  If so, are there any without handrails? No  Home free of loose throw rugs in walkways, pet beds, electrical cords, etc? Yes  Adequate lighting in your home to reduce risk of falls? Yes   ASSISTIVE DEVICES UTILIZED TO PREVENT FALLS:  Life alert? No  Use of a cane, walker or w/c? No  Grab bars in  the bathroom? No  Shower chair or bench in shower? No  Elevated toilet seat or a handicapped toilet? No   TIMED UP AND GO:  Was the test performed? No .  Length of time to ambulate 10 feet: 0 sec.   Gait steady and fast  without use of assistive device  Cognitive Function: Normal cognitive status assessed by direct observation by this Nurse Health Advisor. No abnormalities found.          Immunizations Immunization History  Administered Date(s) Administered   Influenza Split 04/03/2012   Influenza Whole 03/24/2010, 04/19/2011   Influenza, High Dose Seasonal PF 03/30/2016, 03/25/2017, 04/10/2018, 03/15/2019   Influenza-Unspecified 03/19/2013   PFIZER(Purple Top)SARS-COV-2 Vaccination 07/28/2019, 08/18/2019, 04/01/2020   Pneumococcal Conjugate-13 08/14/2013   Pneumococcal Polysaccharide-23 04/16/2011   Td 09/15/2009   Tdap 01/28/2020   Zoster Recombinat (Shingrix) 05/09/2018, 07/07/2018    TDAP status: Up to date  Flu Vaccine status: Up to date  Pneumococcal vaccine status: Up to date  Covid-19 vaccine status: Completed vaccines  Qualifies for Shingles Vaccine? Yes   Zostavax completed Yes   Shingrix Completed?: Yes  Screening Tests Health Maintenance  Topic Date Due   Hepatitis C Screening  Never done   Fecal DNA (Cologuard)  03/08/2018   INFLUENZA VACCINE  02/17/2020   TETANUS/TDAP  01/27/2030   DEXA SCAN  Completed   COVID-19 Vaccine  Completed   PNA vac Low Risk Adult  Completed    Health Maintenance  Health Maintenance Due  Topic Date Due   Hepatitis C Screening  Never done   Fecal DNA (Cologuard)  03/08/2018   INFLUENZA VACCINE  02/17/2020    Colorectal cancer screening: No longer required.   Mammogram status: Completed 04/25/2020. Repeat every year  Bone Density status: Completed 12/20/2014. Results reflect: Bone density results: OSTEOPENIA. Repeat every 2-3 years.  Lung Cancer Screening: (Low Dose CT Chest recommended if Age 58-80 years, 30 pack-year currently smoking OR have quit w/in 15years.) does not qualify.   Lung Cancer Screening Referral: no  Additional Screening:  Hepatitis C Screening: does qualify; Completed no  Vision Screening: Recommended annual  ophthalmology exams for early detection of glaucoma and other disorders of the eye. Is the patient up to date with their annual eye exam?  Yes  Who is the provider or what is the name of the office in which the patient attends annual eye exams? Velna Ochs, MD. If pt is not established with a provider, would they like to be referred to a provider to establish care? No .   Dental Screening: Recommended annual dental exams for proper oral hygiene  Community Resource Referral / Chronic Care Management: CRR required this visit?  No   CCM required this visit?  No      Plan:     I have personally reviewed and noted the following in the patient's chart:   Medical and social history Use of alcohol, tobacco or illicit drugs  Current medications and supplements Functional ability and status Nutritional status Physical activity Advanced directives List of other physicians Hospitalizations, surgeries, and ER visits in previous 12 months Vitals Screenings to include cognitive, depression, and falls Referrals and appointments  In addition, I have reviewed and discussed with patient certain preventive protocols, quality metrics, and best practice recommendations. A written personalized care plan for preventive services as well as general preventive health recommendations were provided to patient.     Mickeal Needy, LPN   08/23/8525   Nurse Notes: n/a  Medical screening examination/treatment/procedure(s) were performed by non-physician practitioner and as supervising physician I was immediately available for consultation/collaboration.  I agree with above. Lew Dawes, MD

## 2020-09-29 ENCOUNTER — Other Ambulatory Visit: Payer: Self-pay | Admitting: Internal Medicine

## 2020-09-29 DIAGNOSIS — I2510 Atherosclerotic heart disease of native coronary artery without angina pectoris: Secondary | ICD-10-CM | POA: Diagnosis not present

## 2020-09-29 DIAGNOSIS — I5181 Takotsubo syndrome: Secondary | ICD-10-CM | POA: Diagnosis not present

## 2020-09-29 DIAGNOSIS — E039 Hypothyroidism, unspecified: Secondary | ICD-10-CM | POA: Diagnosis not present

## 2020-09-29 DIAGNOSIS — K589 Irritable bowel syndrome without diarrhea: Secondary | ICD-10-CM | POA: Diagnosis not present

## 2020-09-29 DIAGNOSIS — R0789 Other chest pain: Secondary | ICD-10-CM | POA: Diagnosis not present

## 2020-09-29 DIAGNOSIS — I10 Essential (primary) hypertension: Secondary | ICD-10-CM | POA: Diagnosis not present

## 2020-09-29 DIAGNOSIS — E785 Hyperlipidemia, unspecified: Secondary | ICD-10-CM | POA: Diagnosis not present

## 2020-10-06 DIAGNOSIS — E039 Hypothyroidism, unspecified: Secondary | ICD-10-CM | POA: Diagnosis not present

## 2020-10-06 DIAGNOSIS — E785 Hyperlipidemia, unspecified: Secondary | ICD-10-CM | POA: Diagnosis not present

## 2020-10-06 DIAGNOSIS — I10 Essential (primary) hypertension: Secondary | ICD-10-CM | POA: Diagnosis not present

## 2020-10-14 ENCOUNTER — Other Ambulatory Visit: Payer: Self-pay | Admitting: Internal Medicine

## 2020-10-14 DIAGNOSIS — N183 Chronic kidney disease, stage 3 unspecified: Secondary | ICD-10-CM

## 2020-11-04 ENCOUNTER — Other Ambulatory Visit: Payer: Self-pay

## 2020-11-04 ENCOUNTER — Ambulatory Visit: Payer: Medicare Other | Admitting: Podiatry

## 2020-11-04 DIAGNOSIS — M79675 Pain in left toe(s): Secondary | ICD-10-CM

## 2020-11-04 DIAGNOSIS — M2042 Other hammer toe(s) (acquired), left foot: Secondary | ICD-10-CM

## 2020-11-04 DIAGNOSIS — Q828 Other specified congenital malformations of skin: Secondary | ICD-10-CM

## 2020-11-04 DIAGNOSIS — M79674 Pain in right toe(s): Secondary | ICD-10-CM

## 2020-11-04 DIAGNOSIS — E1149 Type 2 diabetes mellitus with other diabetic neurological complication: Secondary | ICD-10-CM | POA: Diagnosis not present

## 2020-11-04 DIAGNOSIS — M2041 Other hammer toe(s) (acquired), right foot: Secondary | ICD-10-CM

## 2020-11-04 DIAGNOSIS — B351 Tinea unguium: Secondary | ICD-10-CM

## 2020-11-09 NOTE — Progress Notes (Signed)
Subjective: 75 year old female presents the office today for evaluation of painful calluses as well as toenails. No redness/drainage to the toenail or callus areas. Denies any open lesions. She denies any redness or drainage or any swelling to the callus or toenail sites.  No new concerns today.  Objective: AAO x3, NAD DP/PT pulses palpable bilaterally, CRT less than 3 seconds   Hyperkeratotic lesion submetatarsal 1, 3, 5 bilaterally.  No ongoing ulceration drainage or signs of infection.  There is no underlying ulceration drainage or any signs of infection. The nails are hypertrophic, dystrophic, discolored x 10 and are causing irritation/pain. No redness, swelling, drainage.  No open lesions or pre-ulcerative lesions.  No pain with calf compression, swelling, warmth, erythema  Assessment: Hyperkeratotic lesions/preulcerative calluses, symptomatic onychomycosis  Plan: -All treatment options discussed with the patient including all alternatives, risks, complications.  -Debrided the calluses x6 without complications or bleeding.  Offloading pads dispensed and recommend moisturizer daily. -Nails debrided X32 without complications or bleeding. -Discussed daily foot inspection  RTC 3 months or sooner if needed  Trula Slade DPM

## 2020-11-20 ENCOUNTER — Telehealth: Payer: Self-pay | Admitting: Podiatry

## 2020-11-20 NOTE — Telephone Encounter (Signed)
Pt returned my call and left message.   I returned call and explained that m[employer] covers 3 pr of diabetic inserts a yr at 80% so the 20% coinsurance is estimated between 80 to 90 dollars. I did tell pt she qualified for shoes as well but she is only wanting the inserts. She is scheduled next week to be molded.

## 2020-11-20 NOTE — Telephone Encounter (Signed)
Left message per Dr Jacqualyn Posey for pt to call me back to discuss diabetic inserts and coverage information.

## 2020-11-25 ENCOUNTER — Ambulatory Visit (INDEPENDENT_AMBULATORY_CARE_PROVIDER_SITE_OTHER): Payer: Medicare Other | Admitting: Podiatry

## 2020-11-25 ENCOUNTER — Other Ambulatory Visit: Payer: Self-pay | Admitting: Internal Medicine

## 2020-11-25 ENCOUNTER — Other Ambulatory Visit: Payer: Self-pay

## 2020-11-25 DIAGNOSIS — E1149 Type 2 diabetes mellitus with other diabetic neurological complication: Secondary | ICD-10-CM

## 2020-11-25 DIAGNOSIS — M2042 Other hammer toe(s) (acquired), left foot: Secondary | ICD-10-CM

## 2020-11-25 DIAGNOSIS — M2041 Other hammer toe(s) (acquired), right foot: Secondary | ICD-10-CM

## 2020-11-25 NOTE — Progress Notes (Signed)
Patient presented for foam casting for 3 pair custom diabetic shoe inserts only.  Patient will be contacted when the inserts are ready for pick up.

## 2020-12-18 ENCOUNTER — Telehealth: Payer: Self-pay | Admitting: Podiatry

## 2020-12-18 NOTE — Telephone Encounter (Signed)
Pt left message returning my call and said she is scheduled to see the pcp on 6.14.  I called pt back and told her I have faxed the documents needed with a note at the top about appt scheduled for 6.14.

## 2020-12-18 NOTE — Telephone Encounter (Signed)
Called and made pt an OV to discuss an order from podiatry regarding shoe inserts.

## 2020-12-18 NOTE — Telephone Encounter (Signed)
Received documents from pcp and last office visit was 10.2021 and it has to be within 6 months.  I called and left message for pt to call and schedule an appt with pcp for a follow up on her diabetes and let me know and I can send paperwork over again.

## 2020-12-30 ENCOUNTER — Encounter: Payer: Self-pay | Admitting: Internal Medicine

## 2020-12-30 ENCOUNTER — Ambulatory Visit (INDEPENDENT_AMBULATORY_CARE_PROVIDER_SITE_OTHER): Payer: Medicare Other | Admitting: Internal Medicine

## 2020-12-30 ENCOUNTER — Other Ambulatory Visit: Payer: Self-pay

## 2020-12-30 ENCOUNTER — Other Ambulatory Visit (INDEPENDENT_AMBULATORY_CARE_PROVIDER_SITE_OTHER): Payer: Medicare Other

## 2020-12-30 VITALS — BP 120/70 | HR 73 | Temp 98.5°F | Ht 61.0 in | Wt 151.6 lb

## 2020-12-30 DIAGNOSIS — R1084 Generalized abdominal pain: Secondary | ICD-10-CM

## 2020-12-30 DIAGNOSIS — K219 Gastro-esophageal reflux disease without esophagitis: Secondary | ICD-10-CM | POA: Diagnosis not present

## 2020-12-30 LAB — CBC WITH DIFFERENTIAL/PLATELET
Basophils Absolute: 0.1 10*3/uL (ref 0.0–0.1)
Basophils Relative: 0.9 % (ref 0.0–3.0)
Eosinophils Absolute: 0.3 10*3/uL (ref 0.0–0.7)
Eosinophils Relative: 3.8 % (ref 0.0–5.0)
HCT: 35.4 % — ABNORMAL LOW (ref 36.0–46.0)
Hemoglobin: 11.7 g/dL — ABNORMAL LOW (ref 12.0–15.0)
Lymphocytes Relative: 34.7 % (ref 12.0–46.0)
Lymphs Abs: 2.7 10*3/uL (ref 0.7–4.0)
MCHC: 33.1 g/dL (ref 30.0–36.0)
MCV: 92.1 fl (ref 78.0–100.0)
Monocytes Absolute: 0.9 10*3/uL (ref 0.1–1.0)
Monocytes Relative: 11.8 % (ref 3.0–12.0)
Neutro Abs: 3.8 10*3/uL (ref 1.4–7.7)
Neutrophils Relative %: 48.8 % (ref 43.0–77.0)
Platelets: 314 10*3/uL (ref 150.0–400.0)
RBC: 3.84 Mil/uL — ABNORMAL LOW (ref 3.87–5.11)
RDW: 13.8 % (ref 11.5–15.5)
WBC: 7.8 10*3/uL (ref 4.0–10.5)

## 2020-12-30 LAB — COMPREHENSIVE METABOLIC PANEL
ALT: 13 U/L (ref 0–35)
AST: 16 U/L (ref 0–37)
Albumin: 4.4 g/dL (ref 3.5–5.2)
Alkaline Phosphatase: 97 U/L (ref 39–117)
BUN: 29 mg/dL — ABNORMAL HIGH (ref 6–23)
CO2: 28 mEq/L (ref 19–32)
Calcium: 9.5 mg/dL (ref 8.4–10.5)
Chloride: 102 mEq/L (ref 96–112)
Creatinine, Ser: 1.28 mg/dL — ABNORMAL HIGH (ref 0.40–1.20)
GFR: 40.99 mL/min — ABNORMAL LOW (ref >60.00)
Glucose, Bld: 80 mg/dL (ref 70–99)
Potassium: 4.6 mEq/L (ref 3.5–5.1)
Sodium: 137 mEq/L (ref 135–145)
Total Bilirubin: 0.3 mg/dL (ref 0.2–1.2)
Total Protein: 7.1 g/dL (ref 6.0–8.3)

## 2020-12-30 LAB — LIPASE: Lipase: 43 U/L (ref 11.0–59.0)

## 2020-12-30 MED ORDER — KETOROLAC TROMETHAMINE 30 MG/ML IJ SOLN
30.0000 mg | Freq: Once | INTRAMUSCULAR | Status: AC
  Administered 2020-12-30: 30 mg via INTRAMUSCULAR

## 2020-12-30 NOTE — Patient Instructions (Signed)
Go to ER if worse 

## 2020-12-30 NOTE — Progress Notes (Signed)
Subjective:  Patient ID: Cindy Simpson, female    DOB: [birth year]  Age: 75 y.o. MRN: [id number]  CC: Follow-up   HPI DAYANARA BRUSHABER presents for abd pain after eating a cheese berger w/fries at [facility] for lunch and the patient drank a chocolate milk from a bottle a little later - c/o generalized upper abd pain that started recently.  She is very uncomfortable.  No n/v. No diarrhea  Outpatient Medications Prior to Visit  Medication Sig Dispense Refill   Aspirin 81 MG CAPS Take by mouth.     bismuth subsalicylate (PEPTO BISMOL) 262 MG/15ML suspension Take 30 mLs by mouth every 6 (six) hours as needed for indigestion.     carvedilol (COREG) 6.25 MG tablet TAKE 1 TABLET BY MOUTH  TWICE DAILY WITH A MEAL 180 tablet 3   cetirizine (ZYRTEC) 10 MG tablet Take 10 mg by mouth at bedtime.     Cholecalciferol 1000 UNITS tablet Take 1,000 Units by mouth daily after supper.      diclofenac sodium (VOLTAREN) 1 % GEL Apply 2 g topically 4 (four) times daily. 100 g 3   famotidine (PEPCID) 20 MG tablet Take 1 tablet (20 mg total) by mouth at bedtime. 90 tablet 3   glucose blood (ACCU-CHEK AVIVA) test strip Use to check blood sugars twice a day 100 each 5   Lancets (ACCU-CHEK SOFT TOUCH) lancets Use to check blood sugars twice a day 100 each 5   levothyroxine (SYNTHROID) 100 MCG tablet Take 100 mcg by mouth daily.     lovastatin (MEVACOR) 10 MG tablet Take 1 tablet (10 mg total) by mouth daily. Annual appt due in Sept w/ labs must see provider for future refills 90 tablet 1   pantoprazole (PROTONIX) 40 MG tablet TAKE 1 TABLET BY MOUTH  TWICE DAILY 180 tablet 3   TURMERIC PO Take 1 tablet by mouth daily.      valsartan (DIOVAN) 80 MG tablet Take 1 tablet (80 mg total) by mouth daily. Annual appt due in Sept must see provider for future refills 90 tablet 1   levothyroxine (SYNTHROID) 75 MCG tablet TAKE 1 TABLET BY MOUTH  DAILY (Patient not taking: Reported on 12/30/2020) 90 tablet 3   0.9 %  sodium  chloride infusion      No facility-administered medications prior to visit.    ROS: Review of Systems  Constitutional:  Negative for activity change, appetite change, chills, fatigue and unexpected weight change.  HENT:  Negative for congestion, mouth sores and sinus pressure.   Eyes:  Negative for visual disturbance.  Respiratory:  Negative for cough and chest tightness.   Gastrointestinal:  Positive for abdominal distention and abdominal pain. Negative for nausea and vomiting.  Genitourinary:  Negative for difficulty urinating, frequency and vaginal pain.  Musculoskeletal:  Negative for back pain and gait problem.  Skin:  Negative for pallor and rash.  Neurological:  Negative for dizziness, tremors, weakness, numbness and headaches.  Psychiatric/Behavioral:  Negative for confusion and sleep disturbance.    Objective:  BP 120/70 (BP Location: Left Arm)   Pulse 73   Temp 98.5 F (36.9 C) (Oral)   Ht 5\' 1"  (1.549 m)   Wt 151 lb 9.6 oz (68.8 kg)   SpO2 97%   BMI 28.64 kg/m   BP Readings from Last 3 Encounters:  12/30/20 120/70  08/21/20 110/80  05/09/20 120/78    Wt Readings from Last 3 Encounters:  12/30/20 151 lb 9.6 oz (68.8 kg)  08/21/20 152 lb 12.8 oz (69.3 kg)  05/09/20 152 lb (68.9 kg)    Physical Exam Constitutional:      General: She is in acute distress.     Appearance: She is well-developed. She is obese. She is not toxic-appearing.  HENT:     Head: Normocephalic.     Right Ear: External ear normal.     Left Ear: External ear normal.     Nose: Nose normal.  Eyes:     General:        Right eye: No discharge.        Left eye: No discharge.     Conjunctiva/sclera: Conjunctivae normal.     Pupils: Pupils are equal, round, and reactive to light.  Neck:     Thyroid: No thyromegaly.     Vascular: No JVD.     Trachea: No tracheal deviation.  Cardiovascular:     Rate and Rhythm: Normal rate and regular rhythm.     Heart sounds: Normal heart sounds.   Pulmonary:     Effort: No respiratory distress.     Breath sounds: No stridor. No wheezing.  Abdominal:     General: Bowel sounds are normal. There is no distension.     Palpations: Abdomen is soft. There is no mass.     Tenderness: There is abdominal tenderness. There is no right CVA tenderness, left CVA tenderness, guarding or rebound.     Hernia: No hernia is present.  Musculoskeletal:        General: No tenderness.     Cervical back: Normal range of motion and neck supple. No rigidity.  Lymphadenopathy:     Cervical: No cervical adenopathy.  Skin:    Findings: No erythema or rash.  Neurological:     Cranial Nerves: No cranial nerve deficit.     Motor: No abnormal muscle tone.     Coordination: Coordination normal.     Deep Tendon Reflexes: Reflexes normal.  Psychiatric:        Behavior: Behavior normal.        Thought Content: Thought content normal.        Judgment: Judgment normal.  The patient is in the mild acute distress due to pain.  She is pacing across the room to calm her pain.  No jaundice  Lab Results  Component Value Date   WBC 9.5 03/28/2019   HGB 11.5 (L) 03/28/2019   HCT 36.5 03/28/2019   PLT 292 03/28/2019   GLUCOSE 85 02/18/2020   CHOL 186 01/24/2019   TRIG 125.0 01/24/2019   HDL 55.10 01/24/2019   LDLDIRECT 137.5 03/17/2009   LDLCALC 106 (H) 01/24/2019   ALT 13 03/28/2019   AST 21 03/28/2019   NA 136 02/18/2020   K 4.7 02/18/2020   CL 100 02/18/2020   CREATININE 0.91 02/18/2020   BUN 22 02/18/2020   CO2 28 02/18/2020   TSH 5.56 (H) 02/18/2020   INR 0.99 03/03/2016   HGBA1C 5.9 07/30/2019    CT ABDOMEN PELVIS W CONTRAST  Result Date: 03/28/2019 CLINICAL DATA:  Acute generalized abdominal pain. EXAM: CT ABDOMEN AND PELVIS WITH CONTRAST TECHNIQUE: Multidetector CT imaging of the abdomen and pelvis was performed using the standard protocol following bolus administration of intravenous contrast. CONTRAST:  OMNIPAQUE IOHEXOL 300 MG/ML  SOLN  COMPARISON:  CT scan of July 06, 2018. FINDINGS: Lower chest: No acute abnormality. Hepatobiliary: No gallstones are noted. Dilated gallbladder is noted. No biliary dilatation is noted. No hepatic abnormality is  noted. Pancreas: Unremarkable. No pancreatic ductal dilatation or surrounding inflammatory changes. Spleen: Normal in size without focal abnormality. Adrenals/Urinary Tract: Adrenal glands appear normal. Stable large right renal cyst is noted. No hydronephrosis or renal obstruction is noted. No renal or ureteral calculi are noted. Urinary bladder is unremarkable. Stomach/Bowel: Status post hiatal hernia repair. There is no evidence of bowel obstruction or inflammation. The appendix appears normal. Diverticulosis of descending and sigmoid colon is noted without inflammation. Vascular/Lymphatic: Aortic atherosclerosis. No enlarged abdominal or pelvic lymph nodes. Reproductive: Status post hysterectomy. No adnexal masses. Other: Small fat containing periumbilical and epigastric ventral hernias are noted. No ascites is noted. Musculoskeletal: No acute or significant osseous findings. IMPRESSION: Dilated gallbladder is noted without inflammation or cholelithiasis. No biliary dilatation or hepatic abnormality is noted. Diverticulosis of descending and sigmoid colon is noted without inflammation. Small fat containing periumbilical and epigastric ventral hernias are noted. Aortic Atherosclerosis (ICD10-I70.0). Electronically Signed   By: Lupita Raider M.D.   On: 03/28/2019 15:22    Assessment & Plan:   There are no diagnoses linked to this encounter.   No orders of the defined types were placed in this encounter.    Follow-up: No follow-ups on file.  [name], MD

## 2020-12-30 NOTE — Assessment & Plan Note (Signed)
I gave the patient Pepcid to take with water

## 2020-12-30 NOTE — Assessment & Plan Note (Addendum)
Acute abdominal pain of unclear etiology.  Most likely ileus related to biliary dysfunction or pancreatitis following the consumption of AP fatty meal and chocolate milk. I gave her sample of Pepcid.  Toradol 30 mg IM injection Obtain c-Met, CBC, lipase We discussed the abdominal CT report that the patient had in 2020 done for generalized abdominal pain. The patient was instructed to go to ER if worse or if not better. Fast or have a light snack tonight.

## 2021-01-05 DIAGNOSIS — I2510 Atherosclerotic heart disease of native coronary artery without angina pectoris: Secondary | ICD-10-CM | POA: Diagnosis not present

## 2021-01-05 DIAGNOSIS — I10 Essential (primary) hypertension: Secondary | ICD-10-CM | POA: Diagnosis not present

## 2021-01-05 DIAGNOSIS — E039 Hypothyroidism, unspecified: Secondary | ICD-10-CM | POA: Diagnosis not present

## 2021-01-05 DIAGNOSIS — K589 Irritable bowel syndrome without diarrhea: Secondary | ICD-10-CM | POA: Diagnosis not present

## 2021-01-05 DIAGNOSIS — I5181 Takotsubo syndrome: Secondary | ICD-10-CM | POA: Diagnosis not present

## 2021-01-05 DIAGNOSIS — E785 Hyperlipidemia, unspecified: Secondary | ICD-10-CM | POA: Diagnosis not present

## 2021-02-03 ENCOUNTER — Encounter: Payer: Self-pay | Admitting: Podiatry

## 2021-02-03 ENCOUNTER — Other Ambulatory Visit: Payer: Self-pay

## 2021-02-03 ENCOUNTER — Ambulatory Visit: Payer: Medicare Other | Admitting: Podiatry

## 2021-02-03 DIAGNOSIS — Q828 Other specified congenital malformations of skin: Secondary | ICD-10-CM

## 2021-02-03 DIAGNOSIS — M79675 Pain in left toe(s): Secondary | ICD-10-CM

## 2021-02-03 DIAGNOSIS — E1149 Type 2 diabetes mellitus with other diabetic neurological complication: Secondary | ICD-10-CM | POA: Diagnosis not present

## 2021-02-03 DIAGNOSIS — M79674 Pain in right toe(s): Secondary | ICD-10-CM

## 2021-02-03 DIAGNOSIS — B351 Tinea unguium: Secondary | ICD-10-CM

## 2021-02-07 NOTE — Progress Notes (Signed)
Subjective: 75 year old female presents the office today for evaluation of painful calluses as well as toenails. No redness/drainage to the toenail or callus areas. Denies any ulcerations. She denies any redness or drainage or any swelling to the callus or toenail sites.  No new concerns today.  Objective: AAO x3, NAD DP/PT pulses palpable bilaterally, CRT less than 3 seconds  Hyperkeratotic lesion submetatarsal 1, 3, 5 bilaterally.  No ongoing ulceration drainage or signs of infection.  There is no underlying ulceration drainage or any signs of infection. The nails are hypertrophic, dystrophic, discolored x 10 and are causing irritation/pain. No redness, swelling, drainage.  No open lesions or pre-ulcerative lesions.  No pain with calf compression, swelling, warmth, erythema  Assessment: Hyperkeratotic lesions/preulcerative calluses, symptomatic onychomycosis  Plan: -All treatment options discussed with the patient including all alternatives, risks, complications.  -Debrided the calluses x6 without complications or bleeding.  Offloading pads dispensed and recommend moisturizer daily. -Nails debrided [id number] without complications or bleeding. -Discussed daily foot inspection  RTC 3 months or sooner if needed  Trula Slade DPM

## 2021-02-19 ENCOUNTER — Telehealth: Payer: Self-pay | Admitting: Podiatry

## 2021-02-19 NOTE — Telephone Encounter (Signed)
Diabetic shoes/inserts in..lvm for pt to call to schedule an appt to pick them up. 

## 2021-02-26 ENCOUNTER — Ambulatory Visit (INDEPENDENT_AMBULATORY_CARE_PROVIDER_SITE_OTHER): Payer: Medicare Other

## 2021-02-26 ENCOUNTER — Other Ambulatory Visit: Payer: Self-pay

## 2021-02-26 DIAGNOSIS — E1140 Type 2 diabetes mellitus with diabetic neuropathy, unspecified: Secondary | ICD-10-CM | POA: Diagnosis not present

## 2021-02-26 DIAGNOSIS — M2042 Other hammer toe(s) (acquired), left foot: Secondary | ICD-10-CM | POA: Diagnosis not present

## 2021-02-26 DIAGNOSIS — M2041 Other hammer toe(s) (acquired), right foot: Secondary | ICD-10-CM | POA: Diagnosis not present

## 2021-02-26 DIAGNOSIS — E1149 Type 2 diabetes mellitus with other diabetic neurological complication: Secondary | ICD-10-CM

## 2021-02-26 NOTE — Progress Notes (Signed)
Patient in office today to pick-up custom diabetic inserts. Patient tried the inserts on and was satisfied with the fit. Patient educated on the break-in process as this time and verbalized understanding. Advised patient to call the office with any questions, comments or concerns.

## 2021-03-17 ENCOUNTER — Other Ambulatory Visit: Payer: Self-pay | Admitting: Internal Medicine

## 2021-04-06 DIAGNOSIS — I5181 Takotsubo syndrome: Secondary | ICD-10-CM | POA: Diagnosis not present

## 2021-04-06 DIAGNOSIS — E785 Hyperlipidemia, unspecified: Secondary | ICD-10-CM | POA: Diagnosis not present

## 2021-04-06 DIAGNOSIS — I2510 Atherosclerotic heart disease of native coronary artery without angina pectoris: Secondary | ICD-10-CM | POA: Diagnosis not present

## 2021-04-06 DIAGNOSIS — I10 Essential (primary) hypertension: Secondary | ICD-10-CM | POA: Diagnosis not present

## 2021-04-07 ENCOUNTER — Ambulatory Visit: Payer: Medicare Other | Admitting: Podiatry

## 2021-04-07 ENCOUNTER — Other Ambulatory Visit: Payer: Self-pay

## 2021-04-07 DIAGNOSIS — M79675 Pain in left toe(s): Secondary | ICD-10-CM

## 2021-04-07 DIAGNOSIS — M79674 Pain in right toe(s): Secondary | ICD-10-CM | POA: Diagnosis not present

## 2021-04-07 DIAGNOSIS — B351 Tinea unguium: Secondary | ICD-10-CM | POA: Diagnosis not present

## 2021-04-07 DIAGNOSIS — E1149 Type 2 diabetes mellitus with other diabetic neurological complication: Secondary | ICD-10-CM

## 2021-04-07 DIAGNOSIS — Q828 Other specified congenital malformations of skin: Secondary | ICD-10-CM

## 2021-04-12 NOTE — Progress Notes (Signed)
Subjective: 75 year old female presents the office today for evaluation of painful calluses as well as toenails. No redness/drainage to the toenail or callus areas.  She denies any open sores or any swelling or redness or any drainage.  No new concerns.   Objective: AAO x3, NAD DP/PT pulses palpable bilaterally, CRT less than 3 seconds  Hyperkeratotic lesion submetatarsal 1, 3, 5 bilaterally.  No ongoing ulceration drainage or signs of infection.  There is no underlying ulceration drainage or any signs of infection. The nails are hypertrophic, dystrophic, discolored x 10 and are causing irritation/pain. No redness, swelling, drainage.  No open lesions or pre-ulcerative lesions.  No pain with calf compression, swelling, warmth, erythema  Assessment: Hyperkeratotic lesions/preulcerative calluses, symptomatic onychomycosis  Plan: -All treatment options discussed with the patient including all alternatives, risks, complications.  -Debrided the calluses x6 without complications or bleeding.  It was tender to trim the calluses on the right foot she had pain with this.  Please soften calluses before debridement.  Offloading pads dispensed and recommend moisturizer daily. -Nails debrided D89 without complications or bleeding. -Discussed daily foot inspection  RTC 3 months or sooner if needed  Trula Slade DPM

## 2021-04-23 ENCOUNTER — Ambulatory Visit (INDEPENDENT_AMBULATORY_CARE_PROVIDER_SITE_OTHER): Payer: Medicare Other

## 2021-04-23 ENCOUNTER — Other Ambulatory Visit: Payer: Self-pay

## 2021-04-23 ENCOUNTER — Ambulatory Visit: Payer: Medicare Other | Admitting: Podiatry

## 2021-04-23 DIAGNOSIS — M778 Other enthesopathies, not elsewhere classified: Secondary | ICD-10-CM

## 2021-04-23 DIAGNOSIS — M7751 Other enthesopathy of right foot: Secondary | ICD-10-CM

## 2021-04-23 DIAGNOSIS — M25476 Effusion, unspecified foot: Secondary | ICD-10-CM

## 2021-04-23 MED ORDER — MELOXICAM 7.5 MG PO TABS: 7.5000 mg | ORAL_TABLET | Freq: Every day | ORAL | 0 refills | Status: DC

## 2021-04-26 NOTE — Progress Notes (Signed)
Subjective: 75 year old female presents the office today for concerns of a new issue of the side of her right foot.  She states that shortly after last saw her she started getting pain inside of her foot without any injury.  She does note some mild swelling.  She states is not the calluses that are causing any discomfort she has not seen any swelling or redness or any drainage to the callus sites.  No treatment for this new issue.  She has no other concerns today.  Objective: AAO x3, NAD DP/PT pulses palpable bilaterally, CRT less than 3 seconds There is localized edema present on the fourth, fifth metatarsal cuboid joint.  Faint erythema.  There is no pain on the course of the peroneal tendon otherwise.  No other areas of discomfort.  There is no open lesions.  Callused areas that were recently debrided submetatarsal without any edema, erythema, drainage or pus or any open sores.  No pain with calf compression, swelling, warmth, erythema  Assessment: Insertional peroneal tendinitis, capsulitis right foot  Plan: -All treatment options discussed with the patient including all alternatives, risks, complications.  -X-rays obtained reviewed.  No evidence of acute fracture or stress fracture. -We discussed steroid injection but held off on this today.  Prescribe meloxicam.  Surgical shoe dispensed.  Ice elevation. -Patient encouraged to call the office with any questions, concerns, change in symptoms.   Trula Slade DPM

## 2021-04-27 DIAGNOSIS — Z1231 Encounter for screening mammogram for malignant neoplasm of breast: Secondary | ICD-10-CM | POA: Diagnosis not present

## 2021-04-27 LAB — HM MAMMOGRAPHY

## 2021-05-01 ENCOUNTER — Encounter: Payer: Self-pay | Admitting: Internal Medicine

## 2021-05-12 ENCOUNTER — Other Ambulatory Visit: Payer: Self-pay | Admitting: Podiatry

## 2021-05-12 DIAGNOSIS — M778 Other enthesopathies, not elsewhere classified: Secondary | ICD-10-CM

## 2021-05-14 ENCOUNTER — Other Ambulatory Visit: Payer: Self-pay | Admitting: Internal Medicine

## 2021-05-17 ENCOUNTER — Encounter: Payer: Self-pay | Admitting: Podiatry

## 2021-05-21 ENCOUNTER — Ambulatory Visit: Payer: Medicare Other | Admitting: Podiatry

## 2021-07-01 DIAGNOSIS — Z961 Presence of intraocular lens: Secondary | ICD-10-CM | POA: Diagnosis not present

## 2021-07-01 DIAGNOSIS — H35033 Hypertensive retinopathy, bilateral: Secondary | ICD-10-CM | POA: Diagnosis not present

## 2021-07-01 DIAGNOSIS — H04123 Dry eye syndrome of bilateral lacrimal glands: Secondary | ICD-10-CM | POA: Diagnosis not present

## 2021-07-01 DIAGNOSIS — H26493 Other secondary cataract, bilateral: Secondary | ICD-10-CM | POA: Diagnosis not present

## 2021-07-06 DIAGNOSIS — I2510 Atherosclerotic heart disease of native coronary artery without angina pectoris: Secondary | ICD-10-CM | POA: Diagnosis not present

## 2021-07-06 DIAGNOSIS — E785 Hyperlipidemia, unspecified: Secondary | ICD-10-CM | POA: Diagnosis not present

## 2021-07-06 DIAGNOSIS — I10 Essential (primary) hypertension: Secondary | ICD-10-CM | POA: Diagnosis not present

## 2021-07-06 DIAGNOSIS — I5181 Takotsubo syndrome: Secondary | ICD-10-CM | POA: Diagnosis not present

## 2021-07-07 ENCOUNTER — Ambulatory Visit: Payer: Medicare Other | Admitting: Podiatry

## 2021-07-07 ENCOUNTER — Other Ambulatory Visit: Payer: Self-pay

## 2021-07-07 DIAGNOSIS — M79674 Pain in right toe(s): Secondary | ICD-10-CM

## 2021-07-07 DIAGNOSIS — E1149 Type 2 diabetes mellitus with other diabetic neurological complication: Secondary | ICD-10-CM

## 2021-07-07 DIAGNOSIS — Q828 Other specified congenital malformations of skin: Secondary | ICD-10-CM

## 2021-07-07 DIAGNOSIS — B351 Tinea unguium: Secondary | ICD-10-CM | POA: Diagnosis not present

## 2021-07-07 DIAGNOSIS — M79675 Pain in left toe(s): Secondary | ICD-10-CM

## 2021-07-09 NOTE — Progress Notes (Signed)
Subjective: 74 year old female presents the office today for evaluation of painful calluses as well as toenails. No redness/drainage to the toenail or callus areas.  The pain that she experienced in her right foot is resolved.  She denies any open sores or any swelling or redness or any drainage.  No new concerns.   Objective: AAO x3, NAD DP/PT pulses palpable bilaterally, CRT less than 3 seconds  Hyperkeratotic lesion submetatarsal 1, 3, 5 bilaterally.  No ongoing ulceration drainage or signs of infection.  There is no underlying ulceration drainage or any signs of infection. The nails are hypertrophic, dystrophic, discolored x 10 and are causing irritation/pain. No redness, swelling, drainage.  No open lesions or pre-ulcerative lesions.  No pain with calf compression, swelling, warmth, erythema  Assessment: Hyperkeratotic lesions/preulcerative calluses, symptomatic onychomycosis  Plan: -All treatment options discussed with the patient including all alternatives, risks, complications.  -Debrided the calluses x6 without complications or bleeding.  It was tender to trim the calluses on the right foot she had pain with this.  Please soften calluses before debridement.  Offloading pads dispensed and recommend moisturizer daily. -Nails debrided U44 without complications or bleeding. -Discussed daily foot inspection  RTC 3 months or sooner if needed  Trula Slade DPM

## 2021-07-28 ENCOUNTER — Encounter: Payer: Self-pay | Admitting: Internal Medicine

## 2021-08-17 ENCOUNTER — Encounter: Payer: Self-pay | Admitting: Internal Medicine

## 2021-08-18 ENCOUNTER — Telehealth: Payer: Self-pay | Admitting: Internal Medicine

## 2021-08-18 NOTE — Telephone Encounter (Signed)
Left message for patient to call back to schedule Medicare Annual Wellness Visit   Last AWV  08/21/20  Please schedule at anytime with [facility] if patient calls the office back.    40 Minutes appointment   Any questions, please call me at [phone]

## 2021-08-25 ENCOUNTER — Ambulatory Visit (INDEPENDENT_AMBULATORY_CARE_PROVIDER_SITE_OTHER): Payer: Medicare Other

## 2021-08-25 ENCOUNTER — Other Ambulatory Visit: Payer: Self-pay

## 2021-08-25 DIAGNOSIS — Z0000 Encounter for general adult medical examination without abnormal findings: Secondary | ICD-10-CM

## 2021-08-25 NOTE — Progress Notes (Addendum)
I connected with Cindy Simpson today by telephone and verified that I am speaking with the correct person using two identifiers. Location patient: home Location provider: work Persons participating in the virtual visit: patient, provider.   I discussed the limitations, risks, security and privacy concerns of performing an evaluation and management service by telephone and the availability of in person appointments. I also discussed with the patient that there may be a patient responsible charge related to this service. The patient expressed understanding and verbally consented to this telephonic visit.    Interactive audio and video telecommunications were attempted between this provider and patient, however failed, due to patient having technical difficulties OR patient did not have access to video capability.  We continued and completed visit with audio only.  Some vital signs may be absent or patient reported.   Time Spent with patient on telephone encounter: 40 minutes  Subjective:   Cindy Simpson is a 76 y.o. female who presents for Medicare Annual (Subsequent) preventive examination.  Review of Systems     Cardiac Risk Factors include: advanced age (>59men, >65 women);dyslipidemia;family history of premature cardiovascular disease;hypertension     Objective:    There were no vitals filed for this visit. There is no height or weight on file to calculate BMI.  Advanced Directives 08/25/2021 08/21/2020 03/28/2019 07/05/2018 04/05/2017 07/20/2016 03/09/2016  Does Patient Have a Medical Advance Directive? No No No No No No No  Would patient like information on creating a medical advance directive? No - Patient declined No - Patient declined - - - - No - patient declined information  Pre-existing out of facility DNR order (yellow form or pink MOST form) - - - - - - -    Current Medications (verified) Outpatient Encounter Medications as of 08/25/2021  Medication Sig   Aspirin 81 MG CAPS  Take by mouth.   atorvastatin (LIPITOR) 10 MG tablet Take 10 mg by mouth daily.   bismuth subsalicylate (PEPTO BISMOL) 262 MG/15ML suspension Take 30 mLs by mouth every 6 (six) hours as needed for indigestion.   carvedilol (COREG) 6.25 MG tablet TAKE 1 TABLET BY MOUTH  TWICE DAILY WITH A MEAL   cetirizine (ZYRTEC) 10 MG tablet Take 10 mg by mouth at bedtime.   Cholecalciferol 1000 UNITS tablet Take 1,000 Units by mouth daily after supper.    diclofenac sodium (VOLTAREN) 1 % GEL Apply 2 g topically 4 (four) times daily.   famotidine (PEPCID) 20 MG tablet Take 1 tablet (20 mg total) by mouth at bedtime.   glucose blood (ACCU-CHEK AVIVA) test strip Use to check blood sugars twice a day   Lancets (ACCU-CHEK SOFT TOUCH) lancets Use to check blood sugars twice a day   levothyroxine (SYNTHROID) 100 MCG tablet Take 100 mcg by mouth daily.   lovastatin (MEVACOR) 10 MG tablet TAKE 1 TABLET BY MOUTH  DAILY   meloxicam (MOBIC) 7.5 MG tablet Take 1 tablet (7.5 mg total) by mouth daily.   pantoprazole (PROTONIX) 40 MG tablet TAKE 1 TABLET BY MOUTH  TWICE DAILY   TURMERIC PO Take 1 tablet by mouth daily.    valsartan (DIOVAN) 80 MG tablet TAKE 1 TABLET BY MOUTH  DAILY   No facility-administered encounter medications on file as of 08/25/2021.    Allergies (verified) Lisinopril, Vinegar [acetic acid], and Metoclopramide hcl   History: Past Medical History:  Diagnosis Date   Allergic rhinitis    Allergy    Anemia    Asthmatic bronchitis 2009  allergies; post nasal drip but not allergies   GERD (gastroesophageal reflux disease)    History of hiatal hernia    Hyperlipidemia    Hypertension    Hypothyroidism    IBS (irritable bowel syndrome)    Insomnia    Irregular heart beat Not sure; long time ago   Periodic irregular heart rate; take verapamil.   Status post dilation of esophageal narrowing    Torticollis 2006   Estimate date; rec'd botox and resolved   Past Surgical History:  Procedure  Laterality Date   ABDOMINAL HYSTERECTOMY     with ovaries removed also.   CARDIAC CATHETERIZATION N/A 09/13/2015   Procedure: Left Heart Cath and Coronary Angiography;  Surgeon: Charolette Forward, MD;  Location: [facility];  Service: Cardiovascular;  Laterality: N/A;   CATARACT EXTRACTION Bilateral 2014   approx 2 years ago   CATARACT EXTRACTION     [place]     child   COLONOSCOPY     ESOPHAGOGASTRODUODENOSCOPY (EGD) WITH PROPOFOL N/A 12/30/2015   Procedure: ESOPHAGOGASTRODUODENOSCOPY (EGD) WITH PROPOFOL;  Surgeon: Doran Stabler, MD;  Location: [facility];  Service: Gastroenterology;  Laterality: N/A;   HIATAL HERNIA REPAIR N/A 03/09/2016   Procedure: LAPAROSCOPIC REPAIR OF HIATAL HERNIA  NISSEN FUNDOPLICATION UPPER ENDOSCOPY;  Surgeon: Jackolyn Confer, MD;  Location: [facility];  Service: General;  Laterality: N/A;   NISSEN FUNDOPLICATION N/A 01/31/9677   Procedure: NISSEN FUNDOPLICATION;  Surgeon: Jackolyn Confer, MD;  Location: [facility];  Service: General;  Laterality: N/A;   TUBAL LIGATION Bilateral 1985   UPPER GI ENDOSCOPY  03/09/2016   Procedure: UPPER GI ENDOSCOPY;  Surgeon: Jackolyn Confer, MD;  Location: [facility];  Service: General;;   Family History  Problem Relation Age of Onset   Hypertension Mother    Heart disease Father    Stroke Father    Stroke Brother        brain aneurism   Colon polyps Sister    Other Sister        c diff-deceased   Stomach cancer Neg Hx    Rectal cancer Neg Hx    Esophageal cancer Neg Hx    Colon cancer Neg Hx    Social History   Socioeconomic History   Marital status: Married    Spouse name: Not on file   Number of children: 0   Years of education: Not on file   Highest education level: Not on file  Occupational History   Occupation: Retired  Tobacco Use   Smoking status: Never   Smokeless tobacco: Never  Vaping Use   Vaping Use: Never used  Substance and Sexual Activity   Alcohol use: Yes    Alcohol/week: 1.0 - 2.0  standard drink    Types: 1 - 2 Shots of liquor per week    Comment: Perhaps every couple of weeks; not very frequently   Drug use: No   Sexual activity: Never  Other Topics Concern   Not on file  Social History Narrative   Not on file   Social Determinants of Health   Financial Resource Strain: Low Risk    Difficulty of Paying Living Expenses: Not hard at all  Food Insecurity: No Food Insecurity   Worried About Charity fundraiser in the Last Year: Never true   [place] in the Last Year: Never true  Transportation Needs: No Transportation Needs   Lack of Transportation (Medical): No   Lack of Transportation (Non-Medical):  No  Physical Activity: Insufficiently Active   Days of Exercise per Week: 3 days   Minutes of Exercise per Session: 30 min  Stress: No Stress Concern Present   Feeling of Stress : Not at all  Social Connections: Unknown   Frequency of Communication with Friends and Family: More than three times a week   Frequency of Social Gatherings with Friends and Family: Once a week   Attends Religious Services: Patient refused   Marine scientist or Organizations: No   Attends Music therapist: Patient refused   Marital Status: Married    Tobacco Counseling Counseling given: Not Answered   Clinical Intake:  Pre-visit preparation completed: Yes  Pain : No/denies pain     Nutritional Risks: None Diabetes: No  How often do you need to have someone help you when you read instructions, pamphlets, or other written materials from your doctor or pharmacy?: 1 - Never What is the last grade level you completed in school?: 12th grade  Diabetic? no  Interpreter Needed?: No  Information entered by :: Lisette Abu, LPN   Activities of Daily Living In your present state of health, do you have any difficulty performing the following activities: 08/25/2021  Hearing? N  Vision? N  Difficulty concentrating or making decisions? N  Walking  or climbing stairs? N  Dressing or bathing? N  Doing errands, shopping? N  Preparing Food and eating ? N  Using the Toilet? N  In the past six months, have you accidently leaked urine? Y  Comment wear pads for protection  Do you have problems with loss of bowel control? N  Managing your Medications? N  Managing your Finances? N  Housekeeping or managing your Housekeeping? N  Some recent data might be hidden    Patient Care Team: [name], Evie Lacks, MD as PCP - General Danis, Kirke Corin, MD as Consulting Physician (Gastroenterology) Charolette Forward, MD as Consulting Physician (Cardiology) Lonia Skinner, MD as Consulting Physician (Ophthalmology)  Indicate any recent Medical Services you may have received from other than [facility] providers in the past year (date may be approximate).     Assessment:   This is a routine wellness examination for Terease.  Hearing/Vision screen Hearing Screening - Comments:: Patient denied any hearing difficulty.   No hearing aids.  Vision Screening - Comments:: Patient wears only readers.  Eye exam done annually by: Gala Romney, MD.  Dietary issues and exercise activities discussed: Current Exercise Habits: The patient does not participate in regular exercise at present, Exercise limited by: respiratory conditions(s)   Goals Addressed               This Visit's Progress     Exercise 3x per week (30 min per time) (pt-stated)        Likes to work in the yard; Is very busy; Is not sedentary; does not sit around; Keeps moving      Depression Screen PHQ 2/9 Scores 08/25/2021 08/21/2020 05/09/2020 04/04/2019 01/11/2018 11/29/2016 11/03/2015  PHQ - 2 Score 0 0 0 0 0 0 0    Fall Risk Fall Risk  08/25/2021 08/21/2020 05/09/2020 04/04/2019 01/11/2018  Falls in the past year? 1 0 0 0 No  Number falls in past yr: 0 0 - - -  Injury with Fall? 0 0 0 - -  Risk for fall due to : - No Fall Risks - - -  Risk for fall due to: Comment - - - - -  Follow up - Falls  evaluation completed - Falls evaluation completed -    FALL RISK PREVENTION PERTAINING TO THE HOME:  Any stairs in or around the home? No  If so, are there any without handrails? No  Home free of loose throw rugs in walkways, pet beds, electrical cords, etc? Yes  Adequate lighting in your home to reduce risk of falls? Yes   ASSISTIVE DEVICES UTILIZED TO PREVENT FALLS:  Life alert? No  Use of a cane, walker or w/c? No  Grab bars in the bathroom? Yes  Shower chair or bench in shower? Yes  Elevated toilet seat or a handicapped toilet? No   TIMED UP AND GO:  Was the test performed? No .  Length of time to ambulate 10 feet: n/a sec.   Gait steady and fast without use of assistive device  Cognitive Function: Normal cognitive status assessed by direct observation by this Nurse Health Advisor. No abnormalities found.          Immunizations Immunization History  Administered Date(s) Administered   Fluad Quad(high Dose 65+) 04/25/2021   Influenza Split 04/03/2012   Influenza Whole 03/24/2010, 04/19/2011   Influenza, High Dose Seasonal PF 03/30/2016, 03/25/2017, 04/10/2018, 03/15/2019   Influenza-Unspecified 03/19/2013, 04/25/2020   PFIZER(Purple Top)SARS-COV-2 Vaccination 07/28/2019, 08/18/2019, 04/01/2020   Pfizer Covid-19 Vaccine Bivalent Booster 46yrs & up 04/11/2021   Pneumococcal Conjugate-13 08/14/2013   Pneumococcal Polysaccharide-23 04/16/2011   Td 09/15/2009   Tdap 01/28/2020   Zoster Recombinat (Shingrix) 05/09/2018, 07/07/2018    TDAP status: Up to date  Flu Vaccine status: Up to date  Pneumococcal vaccine status: Up to date  Covid-19 vaccine status: Completed vaccines  Qualifies for Shingles Vaccine? Yes   Zostavax completed No   Shingrix Completed?: Yes  Screening Tests Health Maintenance  Topic Date Due   Hepatitis C Screening  Never done   TETANUS/TDAP  01/27/2030   Pneumonia Vaccine 1+ Years old  Completed   INFLUENZA VACCINE  Completed   DEXA  SCAN  Completed   COVID-19 Vaccine  Completed   Zoster Vaccines- Shingrix  Completed   HPV VACCINES  Aged Out   Fecal DNA (Cologuard)  Discontinued    Health Maintenance  Health Maintenance Due  Topic Date Due   Hepatitis C Screening  Never done    Colorectal cancer screening: No longer required.   Mammogram status: Completed 04/27/2021. Repeat every year  Bone Density status: Completed 12/20/2014. Results reflect: Bone density results: OSTEOPENIA. Repeat every 2-3 years.  Lung Cancer Screening: (Low Dose CT Chest recommended if Age 35-80 years, 30 pack-year currently smoking OR have quit w/in 15years.) does not qualify.   Lung Cancer Screening Referral: no  Additional Screening:  Hepatitis C Screening: does qualify; Completed no  Vision Screening: Recommended annual ophthalmology exams for early detection of glaucoma and other disorders of the eye. Is the patient up to date with their annual eye exam?  Yes  Who is the provider or what is the name of the office in which the patient attends annual eye exams? Gala Romney, MD. If pt is not established with a provider, would they like to be referred to a provider to establish care? No .   Dental Screening: Recommended annual dental exams for proper oral hygiene  Community Resource Referral / Chronic Care Management: CRR required this visit?  No   CCM required this visit?  No      Plan:     I have personally reviewed and noted the following  in the patients chart:   Medical and social history Use of alcohol, tobacco or illicit drugs  Current medications and supplements including opioid prescriptions.  Functional ability and status Nutritional status Physical activity Advanced directives List of other physicians Hospitalizations, surgeries, and ER visits in previous 12 months Vitals Screenings to include cognitive, depression, and falls Referrals and appointments  In addition, I have reviewed and discussed with  patient certain preventive protocols, quality metrics, and best practice recommendations. A written personalized care plan for preventive services as well as general preventive health recommendations were provided to patient.     Sheral Flow, LPN   10/24/2498   Nurse Notes:  Patient is cogitatively intact. There were no vitals filed for this visit. There is no height or weight on file to calculate BMI. Patient stated that she has no issues with gait or balance; does not use any assistive devices.  Medical screening examination/treatment/procedure(s) were performed by non-physician practitioner and as supervising physician I was immediately available for consultation/collaboration.  I agree with above. Lew Dawes, MD

## 2021-08-25 NOTE — Patient Instructions (Addendum)
Cindy Simpson , Thank you for taking time to come for your Medicare Wellness Visit. I appreciate your ongoing commitment to your health goals. Please review the following plan we discussed and let me know if I can assist you in the future.   Screening recommendations/referrals: Colonoscopy: Not a candidate for screening due to age 76: 04/27/2021; due every year Bone Density: 12/20/2014 (completed) Recommended yearly ophthalmology/optometry visit for glaucoma screening and checkup Recommended yearly dental visit for hygiene and checkup  Vaccinations: Influenza vaccine: 04/2021 Pneumococcal vaccine: 04/16/2011, 08/14/2013 Tdap vaccine: 01/28/2020; due every 10 years Shingles vaccine: 05/09/2018, 07/07/2018   Covid-19: 07/28/2019, 08/18/2019, 04/01/2020, 04/11/2021  Advanced directives: Advance directive discussed with you today. Even though you declined this today please call our office should you change your mind and we can give you the proper paperwork for you to fill out.  Conditions/risks identified: Yes; Client understands the importance of follow-up with providers by attending scheduled visits and discussed goals to eat healthier, increase physical activity, exercise the brain, socialize more, get enough sleep and make time for laughter.  Next appointment: 08/26/2022 at 1:00 p.m. telephone visit with Lisette Abu, LPN.  If need to reschedule or cancel please call [phone].   Preventive Care 21 Years and Older, Female Preventive care refers to lifestyle choices and visits with your health care provider that can promote health and wellness. What does preventive care include? A yearly physical exam. This is also called an annual well check. Dental exams once or twice a year. Routine eye exams. Ask your health care provider how often you should have your eyes checked. Personal lifestyle choices, including: Daily care of your teeth and gums. Regular physical activity. Eating a  healthy diet. Avoiding tobacco and drug use. Limiting alcohol use. Practicing safe sex. Taking low-dose aspirin every day. Taking vitamin and mineral supplements as recommended by your health care provider. What happens during an annual well check? The services and screenings done by your health care provider during your annual well check will depend on your age, overall health, lifestyle risk factors, and family history of disease. Counseling  Your health care provider may ask you questions about your: Alcohol use. Tobacco use. Drug use. Emotional well-being. Home and relationship well-being. Sexual activity. Eating habits. History of falls. Memory and ability to understand (cognition). Work and work Statistician. Reproductive health. Screening  You may have the following tests or measurements: Height, weight, and BMI. Blood pressure. Lipid and cholesterol levels. These may be checked every 5 years, or more frequently if you are over 29 years old. Skin check. Lung cancer screening. You may have this screening every year starting at age 72 if you have a 30-pack-year history of smoking and currently smoke or have quit within the past 15 years. Fecal occult blood test (FOBT) of the stool. You may have this test every year starting at age 31. Flexible sigmoidoscopy or colonoscopy. You may have a sigmoidoscopy every 5 years or a colonoscopy every 10 years starting at age 13. Hepatitis C blood test. Hepatitis B blood test. Sexually transmitted disease (STD) testing. Diabetes screening. This is done by checking your blood sugar (glucose) after you have not eaten for a while (fasting). You may have this done every 1-3 years. Bone density scan. This is done to screen for osteoporosis. You may have this done starting at age 42. Mammogram. This may be done every 1-2 years. Talk to your health care provider about how often you should have regular mammograms. Talk with your  health care  provider about your test results, treatment options, and if necessary, the need for more tests. Vaccines  Your health care provider may recommend certain vaccines, such as: Influenza vaccine. This is recommended every year. Tetanus, diphtheria, and acellular pertussis (Tdap, Td) vaccine. You may need a Td booster every 10 years. Zoster vaccine. You may need this after age 27. Pneumococcal 13-valent conjugate (PCV13) vaccine. One dose is recommended after age 70. Pneumococcal polysaccharide (PPSV23) vaccine. One dose is recommended after age 28. Talk to your health care provider about which screenings and vaccines you need and how often you need them. This information is not intended to replace advice given to you by your health care provider. Make sure you discuss any questions you have with your health care provider. Document Released: 08/01/2015 Document Revised: 03/24/2016 Document Reviewed: 05/06/2015 Elsevier Interactive Patient Education  2017 [place] Prevention in the Home Falls can cause injuries. They can happen to people of all ages. There are many things you can do to make your home safe and to help prevent falls. What can I do on the outside of my home? Regularly fix the edges of walkways and driveways and fix any cracks. Remove anything that might make you trip as you walk through a door, such as a raised step or threshold. Trim any bushes or trees on the path to your home. Use bright outdoor lighting. Clear any walking paths of anything that might make someone trip, such as rocks or tools. Regularly check to see if handrails are loose or broken. Make sure that both sides of any steps have handrails. Any raised decks and porches should have guardrails on the edges. Have any leaves, snow, or ice cleared regularly. Use sand or salt on walking paths during winter. Clean up any spills in your garage right away. This includes oil or grease spills. What can I do in the  bathroom? Use night lights. Install grab bars by the toilet and in the tub and shower. Do not use towel bars as grab bars. Use non-skid mats or decals in the tub or shower. If you need to sit down in the shower, use a plastic, non-slip stool. Keep the floor dry. Clean up any water that spills on the floor as soon as it happens. Remove soap buildup in the tub or shower regularly. Attach bath mats securely with double-sided non-slip rug tape. Do not have throw rugs and other things on the floor that can make you trip. What can I do in the bedroom? Use night lights. Make sure that you have a light by your bed that is easy to reach. Do not use any sheets or blankets that are too big for your bed. They should not hang down onto the floor. Have a firm chair that has side arms. You can use this for support while you get dressed. Do not have throw rugs and other things on the floor that can make you trip. What can I do in the kitchen? Clean up any spills right away. Avoid walking on wet floors. Keep items that you use a lot in easy-to-reach places. If you need to reach something above you, use a strong step stool that has a grab bar. Keep electrical cords out of the way. Do not use floor polish or wax that makes floors slippery. If you must use wax, use non-skid floor wax. Do not have throw rugs and other things on the floor that can make you trip. What  can I do with my stairs? Do not leave any items on the stairs. Make sure that there are handrails on both sides of the stairs and use them. Fix handrails that are broken or loose. Make sure that handrails are as long as the stairways. Check any carpeting to make sure that it is firmly attached to the stairs. Fix any carpet that is loose or worn. Avoid having throw rugs at the top or bottom of the stairs. If you do have throw rugs, attach them to the floor with carpet tape. Make sure that you have a light switch at the top of the stairs and the  bottom of the stairs. If you do not have them, ask someone to add them for you. What else can I do to help prevent falls? Wear shoes that: Do not have high heels. Have rubber bottoms. Are comfortable and fit you well. Are closed at the toe. Do not wear sandals. If you use a stepladder: Make sure that it is fully opened. Do not climb a closed stepladder. Make sure that both sides of the stepladder are locked into place. Ask someone to hold it for you, if possible. Clearly mark and make sure that you can see: Any grab bars or handrails. First and last steps. Where the edge of each step is. Use tools that help you move around (mobility aids) if they are needed. These include: Canes. Walkers. Scooters. Crutches. Turn on the lights when you go into a dark area. Replace any light bulbs as soon as they burn out. Set up your furniture so you have a clear path. Avoid moving your furniture around. If any of your floors are uneven, fix them. If there are any pets around you, be aware of where they are. Review your medicines with your doctor. Some medicines can make you feel dizzy. This can increase your chance of falling. Ask your doctor what other things that you can do to help prevent falls. This information is not intended to replace advice given to you by your health care provider. Make sure you discuss any questions you have with your health care provider. Document Released: 05/01/2009 Document Revised: 12/11/2015 Document Reviewed: 08/09/2014 Elsevier Interactive Patient Education  2017 [employer].

## 2021-10-06 ENCOUNTER — Ambulatory Visit: Payer: Medicare Other | Admitting: Podiatry

## 2021-10-06 ENCOUNTER — Ambulatory Visit: Payer: Medicare Other

## 2021-10-06 ENCOUNTER — Other Ambulatory Visit: Payer: Self-pay

## 2021-10-06 DIAGNOSIS — M79675 Pain in left toe(s): Secondary | ICD-10-CM

## 2021-10-06 DIAGNOSIS — Q828 Other specified congenital malformations of skin: Secondary | ICD-10-CM

## 2021-10-06 DIAGNOSIS — M79674 Pain in right toe(s): Secondary | ICD-10-CM | POA: Diagnosis not present

## 2021-10-06 DIAGNOSIS — E1149 Type 2 diabetes mellitus with other diabetic neurological complication: Secondary | ICD-10-CM

## 2021-10-06 DIAGNOSIS — B351 Tinea unguium: Secondary | ICD-10-CM | POA: Diagnosis not present

## 2021-10-06 DIAGNOSIS — M2041 Other hammer toe(s) (acquired), right foot: Secondary | ICD-10-CM

## 2021-10-06 NOTE — Progress Notes (Signed)
SITUATION ?Reason for Consult: Evaluation for Prefabricated Diabetic Shoes and Custom Diabetic Inserts. ?Patient / Caregiver Report: Patient would like well fitting shoes ? ?OBJECTIVE DATA: ?Patient History / Diagnosis:  ?  ICD-10-CM   ?1. Type II diabetes mellitus with neurological manifestations ([place])  E11.49   ?  ?2. Hammertoes of both feet  M20.41   ? M20.42   ?  ? ?Physician Treating Diabetes:  Cassandria Anger, MD ? ?Current or Previous Devices:   None and no history ? ?In-Person Foot Examination: ?Ulcers & Callousing:   Present ?Deformities:    Hammertoes ?Sensation:    Compromised  ?Shoe Size:     7.5W ? ?ORTHOTIC RECOMMENDATION ?Recommended Devices: ?- 1x pair prefabricated PDAC approved diabetic shoes; Patient Selected Orthofeet Milinda Hirschfeld 844 Size 7.5W ?- 3x pair custom-to-patient PDAC approved vacuum formed diabetic insoles. ? ?GOALS OF SHOES AND INSOLES ?- Reduce shear and pressure ?- Reduce / Prevent callus formation ?- Reduce / Prevent ulceration ?- Protect the fragile healing compromised diabetic foot. ? ?Patient would benefit from diabetic shoes and inserts as patient has diabetes mellitus and the patient has one or more of the following conditions: ?- History of pre-ulcerative callus ?- Peripheral neuropathy with evidence of callus formation ?- Foot deformity ?- Poor circulation ? ?ACTIONS PERFORMED ?Potential out of pocket cost was communicated to patient. Patient understood and consented to measurement and casting. Patient was casted for insoles via crush box and measured for shoes via brannock device. Procedure was explained and patient tolerated procedure well. All questions were answered and concerns addressed. Casts were shipped to central fabrication for HOLD until Certificate of Medical Necessity or otherwise necessary authorization from insurance is obtained. ? ?PLAN ?Shoes are to be ordered and casts released from hold once all appropriate paperwork is complete. Patient is to be  contacted and scheduled for fitting once shoes and insoles have been fabricated and received. ? ?

## 2021-10-08 ENCOUNTER — Encounter: Payer: Self-pay | Admitting: Internal Medicine

## 2021-10-08 ENCOUNTER — Ambulatory Visit: Payer: Medicare Other | Admitting: Internal Medicine

## 2021-10-08 NOTE — Progress Notes (Signed)
Subjective: ?76 year old female presents the office today for evaluation of painful calluses as well as toenails. No redness/drainage to the toenail or callus areas.  No ulcerations that she reports.  No new concerns or injuries that she reports.  ? ?Objective: ?AAO x3, NAD ?DP/PT pulses palpable bilaterally, CRT less than 3 seconds  ?Hyperkeratotic lesion submetatarsal 1, 3, 5 bilaterally.  No ongoing ulceration drainage or signs of infection.  There is no underlying ulceration drainage or any signs of infection. ?The nails are hypertrophic, dystrophic, discolored x 10 and are causing irritation/pain. No redness, swelling, drainage.  No open lesions or pre-ulcerative lesions.  ?No pain with calf compression, swelling, warmth, erythema ? ?Assessment: ?Hyperkeratotic lesions/preulcerative calluses, symptomatic onychomycosis ? ?Plan: ?-All treatment options discussed with the patient including all alternatives, risks, complications.  ?-Debrided the calluses x6 without complications or bleeding.  It was tender to trim the calluses on the right foot she had pain with this.  Please soften calluses before debridement.  Offloading pads dispensed and recommend moisturizer daily. ?-Nails debrided W65 without complications or bleeding. ?-Discussed daily foot inspection ? ?RTC 3 months or sooner if needed ? ?Trula Slade DPM ? ?

## 2021-10-12 ENCOUNTER — Other Ambulatory Visit: Payer: Self-pay

## 2021-10-12 ENCOUNTER — Ambulatory Visit (INDEPENDENT_AMBULATORY_CARE_PROVIDER_SITE_OTHER): Payer: Medicare Other | Admitting: Internal Medicine

## 2021-10-12 ENCOUNTER — Encounter: Payer: Self-pay | Admitting: Internal Medicine

## 2021-10-12 DIAGNOSIS — R531 Weakness: Secondary | ICD-10-CM

## 2021-10-12 DIAGNOSIS — E785 Hyperlipidemia, unspecified: Secondary | ICD-10-CM | POA: Diagnosis not present

## 2021-10-12 DIAGNOSIS — I2510 Atherosclerotic heart disease of native coronary artery without angina pectoris: Secondary | ICD-10-CM | POA: Diagnosis not present

## 2021-10-12 DIAGNOSIS — R739 Hyperglycemia, unspecified: Secondary | ICD-10-CM | POA: Diagnosis not present

## 2021-10-12 DIAGNOSIS — N183 Chronic kidney disease, stage 3 unspecified: Secondary | ICD-10-CM | POA: Diagnosis not present

## 2021-10-12 DIAGNOSIS — I10 Essential (primary) hypertension: Secondary | ICD-10-CM | POA: Diagnosis not present

## 2021-10-12 DIAGNOSIS — E039 Hypothyroidism, unspecified: Secondary | ICD-10-CM | POA: Diagnosis not present

## 2021-10-12 DIAGNOSIS — I5181 Takotsubo syndrome: Secondary | ICD-10-CM | POA: Diagnosis not present

## 2021-10-12 LAB — HEPATIC FUNCTION PANEL
ALT: 13 U/L (ref 7–35)
AST: 16 (ref 13–35)
Alkaline Phosphatase: 108 (ref 25–125)
Bilirubin, Direct: 0.14 (ref 0.01–0.4)
Bilirubin, Total: 0.3

## 2021-10-12 LAB — BASIC METABOLIC PANEL
BUN: 21 (ref 4–21)
CO2: 24 — AB (ref 13–22)
Chloride: 102 (ref 99–108)
Creatinine: 1.1 (ref 0.5–1.1)
Glucose: 87
Potassium: 4.8 mEq/L (ref 3.5–5.1)
Sodium: 139 (ref 137–147)

## 2021-10-12 LAB — LIPID PANEL
Cholesterol: 160 (ref 0–200)
HDL: 59 (ref 35–70)
LDL Cholesterol: 84
Triglycerides: 93 (ref 40–160)

## 2021-10-12 LAB — COMPREHENSIVE METABOLIC PANEL
Calcium: 9.4 (ref 8.7–10.7)
eGFR: 54

## 2021-10-12 LAB — TSH: TSH: 3.05 (ref 0.41–5.90)

## 2021-10-12 MED ORDER — FREESTYLE LIBRE 2 SENSOR MISC: 1.0000 [IU] | 3 refills | Status: AC

## 2021-10-12 MED ORDER — FREESTYLE LIBRE 2 READER DEVI: 1.0000 [IU] | 0 refills | Status: AC

## 2021-10-12 NOTE — Assessment & Plan Note (Signed)
Recurrent weakness, sweats - rare. The pt is worried about low glucose ?The pt would like to use [place] device ?Rx given - Libre 2 ?Check A1c, CMET ?

## 2021-10-12 NOTE — Assessment & Plan Note (Signed)
Chronic ?Pt is seeing her heart doctor - Dr Terrence Dupont ?Cont on Lovastatin ?

## 2021-10-12 NOTE — Assessment & Plan Note (Addendum)
Chronic ?Pt is seeing her heart doctor - Dr Terrence Dupont ?Cont on Lovastatin ?

## 2021-10-12 NOTE — Progress Notes (Signed)
? ?Subjective:  ?Patient ID: Cindy Simpson, female    DOB: Dec 13, 1945  Age: 76 y.o. MRN: [id number] ? ?CC: No chief complaint on file. ? ? ?HPI ?Cindy Simpson presents for HTN, GERD, dyslipidemia f/u ?C/o possible hypoglycemia - lightheaded at times, sweating - pt is asking to use [place] monitor. F/u CRI ? ?Outpatient Medications Prior to Visit  ?Medication Sig Dispense Refill  ? Aspirin 81 MG CAPS Take by mouth.    ? atorvastatin (LIPITOR) 10 MG tablet Take 10 mg by mouth daily.    ? bismuth subsalicylate (PEPTO BISMOL) 262 MG/15ML suspension Take 30 mLs by mouth every 6 (six) hours as needed for indigestion.    ? carvedilol (COREG) 6.25 MG tablet TAKE 1 TABLET BY MOUTH  TWICE DAILY WITH A MEAL 180 tablet 3  ? cetirizine (ZYRTEC) 10 MG tablet Take 10 mg by mouth at bedtime.    ? Cholecalciferol 1000 UNITS tablet Take 1,000 Units by mouth daily after supper.     ? diclofenac sodium (VOLTAREN) 1 % GEL Apply 2 g topically 4 (four) times daily. 100 g 3  ? famotidine (PEPCID) 20 MG tablet Take 1 tablet (20 mg total) by mouth at bedtime. 90 tablet 3  ? glucose blood (ACCU-CHEK AVIVA) test strip Use to check blood sugars twice a day 100 each 5  ? Lancets (ACCU-CHEK SOFT TOUCH) lancets Use to check blood sugars twice a day 100 each 5  ? levothyroxine (SYNTHROID) 100 MCG tablet Take 100 mcg by mouth daily.    ? lovastatin (MEVACOR) 10 MG tablet TAKE 1 TABLET BY MOUTH  DAILY 90 tablet 3  ? meloxicam (MOBIC) 7.5 MG tablet Take 1 tablet (7.5 mg total) by mouth daily. 14 tablet 0  ? pantoprazole (PROTONIX) 40 MG tablet TAKE 1 TABLET BY MOUTH  TWICE DAILY 180 tablet 3  ? TURMERIC PO Take 1 tablet by mouth daily.     ? valsartan (DIOVAN) 80 MG tablet TAKE 1 TABLET BY MOUTH  DAILY 90 tablet 3  ? ?No facility-administered medications prior to visit.  ? ? ?ROS: ?Review of Systems  ?Constitutional:  Negative for activity change, appetite change, chills, fatigue and unexpected weight change.  ?HENT:  Negative for congestion, mouth  sores and sinus pressure.   ?Eyes:  Negative for visual disturbance.  ?Respiratory:  Negative for cough and chest tightness.   ?Gastrointestinal:  Negative for abdominal pain and nausea.  ?Genitourinary:  Negative for difficulty urinating, frequency and vaginal pain.  ?Musculoskeletal:  Negative for back pain and gait problem.  ?Skin:  Negative for pallor and rash.  ?Neurological:  Positive for dizziness and weakness. Negative for tremors, numbness and headaches.  ?Psychiatric/Behavioral:  Negative for confusion and sleep disturbance.   ? ?Objective:  ?BP 118/70 (BP Location: Left Arm, Patient Position: Sitting, Cuff Size: Large)   Pulse 80   Temp 98.2 ?F (36.8 ?C) (Oral)   Ht 5\' 1"  (1.549 m)   Wt 150 lb (68 kg)   SpO2 95%   BMI 28.34 kg/m?  ? ?BP Readings from Last 3 Encounters:  ?10/12/21 118/70  ?12/30/20 120/70  ?08/21/20 110/80  ? ? ?Wt Readings from Last 3 Encounters:  ?10/12/21 150 lb (68 kg)  ?12/30/20 151 lb 9.6 oz (68.8 kg)  ?08/21/20 152 lb 12.8 oz (69.3 kg)  ? ? ?Physical Exam ?Constitutional:   ?   General: She is not in acute distress. ?   Appearance: She is well-developed. She is obese.  ?HENT:  ?  Head: Normocephalic.  ?   Right Ear: External ear normal.  ?   Left Ear: External ear normal.  ?   Nose: Nose normal.  ?Eyes:  ?   General:     ?   Right eye: No discharge.     ?   Left eye: No discharge.  ?   Conjunctiva/sclera: Conjunctivae normal.  ?   Pupils: Pupils are equal, round, and reactive to light.  ?Neck:  ?   Thyroid: No thyromegaly.  ?   Vascular: No JVD.  ?   Trachea: No tracheal deviation.  ?Cardiovascular:  ?   Rate and Rhythm: Normal rate and regular rhythm.  ?   Heart sounds: Normal heart sounds.  ?Pulmonary:  ?   Effort: No respiratory distress.  ?   Breath sounds: No stridor. No wheezing.  ?Abdominal:  ?   General: Bowel sounds are normal. There is no distension.  ?   Palpations: Abdomen is soft. There is no mass.  ?   Tenderness: There is no abdominal tenderness. There is no  guarding or rebound.  ?Musculoskeletal:     ?   General: No tenderness.  ?   Cervical back: Normal range of motion and neck supple. No rigidity.  ?Lymphadenopathy:  ?   Cervical: No cervical adenopathy.  ?Skin: ?   Findings: No erythema or rash.  ?Neurological:  ?   Cranial Nerves: No cranial nerve deficit.  ?   Motor: No abnormal muscle tone.  ?   Coordination: Coordination normal.  ?   Deep Tendon Reflexes: Reflexes normal.  ?Psychiatric:     ?   Behavior: Behavior normal.     ?   Thought Content: Thought content normal.     ?   Judgment: Judgment normal.  ? ? ?Lab Results  ?Component Value Date  ? WBC 7.8 12/30/2020  ? HGB 11.7 (L) 12/30/2020  ? HCT 35.4 (L) 12/30/2020  ? PLT 314.0 12/30/2020  ? GLUCOSE 80 12/30/2020  ? CHOL 186 01/24/2019  ? TRIG 125.0 01/24/2019  ? HDL 55.10 01/24/2019  ? LDLDIRECT 137.5 03/17/2009  ? LDLCALC 106 (H) 01/24/2019  ? ALT 13 12/30/2020  ? AST 16 12/30/2020  ? NA 137 12/30/2020  ? K 4.6 12/30/2020  ? CL 102 12/30/2020  ? CREATININE 1.28 (H) 12/30/2020  ? BUN 29 (H) 12/30/2020  ? CO2 28 12/30/2020  ? TSH 5.56 (H) 02/18/2020  ? INR 0.99 03/03/2016  ? HGBA1C 5.9 07/30/2019  ? ? ?CT ABDOMEN PELVIS W CONTRAST ? ?Result Date: 03/28/2019 ?CLINICAL DATA:  Acute generalized abdominal pain. EXAM: CT ABDOMEN AND PELVIS WITH CONTRAST TECHNIQUE: Multidetector CT imaging of the abdomen and pelvis was performed using the standard protocol following bolus administration of intravenous contrast. CONTRAST:  OMNIPAQUE IOHEXOL 300 MG/ML  SOLN COMPARISON:  CT scan of July 06, 2018. FINDINGS: Lower chest: No acute abnormality. Hepatobiliary: No gallstones are noted. Dilated gallbladder is noted. No biliary dilatation is noted. No hepatic abnormality is noted. Pancreas: Unremarkable. No pancreatic ductal dilatation or surrounding inflammatory changes. Spleen: Normal in size without focal abnormality. Adrenals/Urinary Tract: Adrenal glands appear normal. Stable large right renal cyst is noted. No  hydronephrosis or renal obstruction is noted. No renal or ureteral calculi are noted. Urinary bladder is unremarkable. Stomach/Bowel: Status post hiatal hernia repair. There is no evidence of bowel obstruction or inflammation. The appendix appears normal. Diverticulosis of descending and sigmoid colon is noted without inflammation. Vascular/Lymphatic: Aortic atherosclerosis. No enlarged abdominal or pelvic  lymph nodes. Reproductive: Status post hysterectomy. No adnexal masses. Other: Small fat containing periumbilical and epigastric ventral hernias are noted. No ascites is noted. Musculoskeletal: No acute or significant osseous findings. IMPRESSION: Dilated gallbladder is noted without inflammation or cholelithiasis. No biliary dilatation or hepatic abnormality is noted. Diverticulosis of descending and sigmoid colon is noted without inflammation. Small fat containing periumbilical and epigastric ventral hernias are noted. Aortic Atherosclerosis (ICD10-I70.0). Electronically Signed   By: Lupita Raider M.D.   On: 03/28/2019 15:22  ? ? ?Assessment & Plan:  ? ?Problem List Items Addressed This Visit   ? ? Dyslipidemia  ?  Chronic ?Pt is seeing her heart doctor - Dr Sharyn Lull ?Cont on Lovastatin ?  ?  ? Spell of generalized weakness  ?  Recurrent weakness, sweats - rare. The pt is worried about low glucose ?The pt would like to use [place] device ?Rx given - Libre 2 ?Check A1c, CMET ?  ?  ? Relevant Orders  ? CBC with Differential/Platelet  ? Comprehensive metabolic panel  ? Hemoglobin A1c  ? TSH  ? Hyperglycemia  ?  Nl CBGs, A1c lately ?Recurrent weakness, sweats - rare. The pt is worried about low glucose ?The pt would like to use [place] device ?Rx given - Libre 2 ?Check A1c, CMET ?  ?  ? Relevant Orders  ? CBC with Differential/Platelet  ? Comprehensive metabolic panel  ? Hemoglobin A1c  ? TSH  ? CAD (coronary artery disease)  ?  Chronic ?Pt is seeing her heart doctor - Dr Sharyn Lull ?Cont on Lovastatin ?  ?  ? CRF  (chronic renal failure), stage 3 (moderate) (HCC)  ?  Monitor GFR. Hydrate well ?  ?  ? Relevant Orders  ? CBC with Differential/Platelet  ? Comprehensive metabolic panel  ? Hemoglobin A1c  ? TSH  ?  ? ? ?Meds ordere

## 2021-10-12 NOTE — Assessment & Plan Note (Signed)
Nl CBGs, A1c lately ?Recurrent weakness, sweats - rare. The pt is worried about low glucose ?The pt would like to use [place] device ?Rx given - Libre 2 ?Check A1c, CMET ?

## 2021-10-12 NOTE — Assessment & Plan Note (Signed)
Monitor GFR Hydrate well 

## 2021-10-26 ENCOUNTER — Telehealth: Payer: Self-pay | Admitting: Internal Medicine

## 2021-10-26 NOTE — Telephone Encounter (Signed)
PT visits today with a form for Dr.[name]. Form has been left in Dr.[name]'s mailbox! ?

## 2021-10-27 ENCOUNTER — Encounter: Payer: Self-pay | Admitting: Internal Medicine

## 2021-10-27 NOTE — Progress Notes (Addendum)
Abstracted labs from Dr. Terrence Dupont.Marland KitchenJohny Chess ? ? ?Medical screening examination/treatment/procedure(s) were performed by non-physician practitioner and as supervising physician I was immediately available for consultation/collaboration.  I agree with above. Lew Dawes, MD ? ?

## 2021-11-25 ENCOUNTER — Other Ambulatory Visit: Payer: Self-pay | Admitting: Internal Medicine

## 2021-12-31 ENCOUNTER — Telehealth: Payer: Self-pay

## 2021-12-31 NOTE — Telephone Encounter (Signed)
CMN Received - Shoes ordered and casts released from fabrication hold.  

## 2022-01-05 ENCOUNTER — Ambulatory Visit: Payer: Medicare Other | Admitting: Podiatry

## 2022-01-05 DIAGNOSIS — E1149 Type 2 diabetes mellitus with other diabetic neurological complication: Secondary | ICD-10-CM

## 2022-01-05 DIAGNOSIS — B351 Tinea unguium: Secondary | ICD-10-CM

## 2022-01-05 DIAGNOSIS — Q828 Other specified congenital malformations of skin: Secondary | ICD-10-CM | POA: Diagnosis not present

## 2022-01-05 DIAGNOSIS — M79675 Pain in left toe(s): Secondary | ICD-10-CM

## 2022-01-05 DIAGNOSIS — M79674 Pain in right toe(s): Secondary | ICD-10-CM | POA: Diagnosis not present

## 2022-01-11 DIAGNOSIS — I10 Essential (primary) hypertension: Secondary | ICD-10-CM | POA: Diagnosis not present

## 2022-01-11 DIAGNOSIS — I2510 Atherosclerotic heart disease of native coronary artery without angina pectoris: Secondary | ICD-10-CM | POA: Diagnosis not present

## 2022-01-11 DIAGNOSIS — E785 Hyperlipidemia, unspecified: Secondary | ICD-10-CM | POA: Diagnosis not present

## 2022-01-11 DIAGNOSIS — E039 Hypothyroidism, unspecified: Secondary | ICD-10-CM | POA: Diagnosis not present

## 2022-01-12 ENCOUNTER — Encounter: Payer: Self-pay | Admitting: Internal Medicine

## 2022-01-12 ENCOUNTER — Ambulatory Visit (INDEPENDENT_AMBULATORY_CARE_PROVIDER_SITE_OTHER): Payer: Medicare Other | Admitting: Internal Medicine

## 2022-01-12 DIAGNOSIS — N183 Chronic kidney disease, stage 3 unspecified: Secondary | ICD-10-CM

## 2022-01-12 DIAGNOSIS — E785 Hyperlipidemia, unspecified: Secondary | ICD-10-CM | POA: Diagnosis not present

## 2022-01-12 DIAGNOSIS — K449 Diaphragmatic hernia without obstruction or gangrene: Secondary | ICD-10-CM

## 2022-01-12 DIAGNOSIS — R739 Hyperglycemia, unspecified: Secondary | ICD-10-CM

## 2022-01-12 DIAGNOSIS — K219 Gastro-esophageal reflux disease without esophagitis: Secondary | ICD-10-CM | POA: Diagnosis not present

## 2022-01-12 DIAGNOSIS — R531 Weakness: Secondary | ICD-10-CM | POA: Diagnosis not present

## 2022-01-12 LAB — CBC WITH DIFFERENTIAL/PLATELET
Basophils Absolute: 0.1 10*3/uL (ref 0.0–0.1)
Basophils Relative: 0.9 % (ref 0.0–3.0)
Eosinophils Absolute: 0.3 10*3/uL (ref 0.0–0.7)
Eosinophils Relative: 4.7 % (ref 0.0–5.0)
HCT: 34.8 % — ABNORMAL LOW (ref 36.0–46.0)
Hemoglobin: 11.5 g/dL — ABNORMAL LOW (ref 12.0–15.0)
Lymphocytes Relative: 25.6 % (ref 12.0–46.0)
Lymphs Abs: 1.7 10*3/uL (ref 0.7–4.0)
MCHC: 33.1 g/dL (ref 30.0–36.0)
MCV: 92.8 fl (ref 78.0–100.0)
Monocytes Absolute: 0.7 10*3/uL (ref 0.1–1.0)
Monocytes Relative: 10 % (ref 3.0–12.0)
Neutro Abs: 3.9 10*3/uL (ref 1.4–7.7)
Neutrophils Relative %: 58.8 % (ref 43.0–77.0)
Platelets: 307 10*3/uL (ref 150.0–400.0)
RBC: 3.75 Mil/uL — ABNORMAL LOW (ref 3.87–5.11)
RDW: 13.8 % (ref 11.5–15.5)
WBC: 6.6 10*3/uL (ref 4.0–10.5)

## 2022-01-12 LAB — COMPREHENSIVE METABOLIC PANEL
ALT: 14 U/L (ref 0–35)
AST: 18 U/L (ref 0–37)
Albumin: 4.1 g/dL (ref 3.5–5.2)
Alkaline Phosphatase: 97 U/L (ref 39–117)
BUN: 22 mg/dL (ref 6–23)
CO2: 28 mEq/L (ref 19–32)
Calcium: 9.6 mg/dL (ref 8.4–10.5)
Chloride: 100 mEq/L (ref 96–112)
Creatinine, Ser: 1.11 mg/dL (ref 0.40–1.20)
GFR: 48.28 mL/min — ABNORMAL LOW (ref >60.00)
Glucose, Bld: 90 mg/dL (ref 70–99)
Potassium: 4.9 mEq/L (ref 3.5–5.1)
Sodium: 135 mEq/L (ref 135–145)
Total Bilirubin: 0.6 mg/dL (ref 0.2–1.2)
Total Protein: 6.8 g/dL (ref 6.0–8.3)

## 2022-01-12 LAB — TSH: TSH: 15.32 u[IU]/mL — ABNORMAL HIGH (ref 0.35–5.50)

## 2022-01-12 LAB — HEMOGLOBIN A1C: Hgb A1c MFr Bld: 6.1 % (ref 4.6–6.5)

## 2022-01-12 NOTE — Assessment & Plan Note (Signed)
Doing well on Protonix.

## 2022-01-14 ENCOUNTER — Other Ambulatory Visit (INDEPENDENT_AMBULATORY_CARE_PROVIDER_SITE_OTHER): Payer: Medicare Other

## 2022-01-14 DIAGNOSIS — E039 Hypothyroidism, unspecified: Secondary | ICD-10-CM | POA: Diagnosis not present

## 2022-01-14 LAB — T4, FREE: Free T4: 0.84 ng/dL (ref 0.60–1.60)

## 2022-01-31 ENCOUNTER — Other Ambulatory Visit: Payer: Self-pay | Admitting: Internal Medicine

## 2022-02-10 ENCOUNTER — Encounter: Payer: Self-pay | Admitting: Internal Medicine

## 2022-03-01 ENCOUNTER — Encounter: Payer: Self-pay | Admitting: Internal Medicine

## 2022-04-12 DIAGNOSIS — I2510 Atherosclerotic heart disease of native coronary artery without angina pectoris: Secondary | ICD-10-CM | POA: Diagnosis not present

## 2022-04-12 DIAGNOSIS — E785 Hyperlipidemia, unspecified: Secondary | ICD-10-CM | POA: Diagnosis not present

## 2022-04-12 DIAGNOSIS — I10 Essential (primary) hypertension: Secondary | ICD-10-CM | POA: Diagnosis not present

## 2022-04-12 DIAGNOSIS — I5181 Takotsubo syndrome: Secondary | ICD-10-CM | POA: Diagnosis not present

## 2022-04-13 ENCOUNTER — Ambulatory Visit: Payer: Medicare Other | Admitting: Podiatry

## 2022-04-13 DIAGNOSIS — M79674 Pain in right toe(s): Secondary | ICD-10-CM | POA: Diagnosis not present

## 2022-04-13 DIAGNOSIS — E1149 Type 2 diabetes mellitus with other diabetic neurological complication: Secondary | ICD-10-CM

## 2022-04-13 DIAGNOSIS — M79675 Pain in left toe(s): Secondary | ICD-10-CM

## 2022-04-13 DIAGNOSIS — B351 Tinea unguium: Secondary | ICD-10-CM

## 2022-04-13 DIAGNOSIS — Q828 Other specified congenital malformations of skin: Secondary | ICD-10-CM | POA: Diagnosis not present

## 2022-04-13 NOTE — Progress Notes (Signed)
Subjective: 76 year old female presents the office today for evaluation of painful calluses as well as toenails.  She has no new concerns today.  Denies any open lesions.   Objective: AAO x3, NAD DP/PT pulses palpable bilaterally, CRT less than 3 seconds  Hyperkeratotic lesion submetatarsal 1, 3, 5 bilaterally.  No ongoing ulceration drainage or signs of infection.  There is no underlying ulceration drainage or any signs of infection. The nails are hypertrophic, dystrophic, discolored x 10 and are causing irritation/pain. No redness, swelling, drainage.  No open lesions or pre-ulcerative lesions.  Bunions present.  She states that it was causing some discomfort but she change her shoes and the symptoms went away. No pain with calf compression, swelling, warmth, erythema  Assessment: Hyperkeratotic lesions/preulcerative calluses, symptomatic onychomycosis  Plan: -All treatment options discussed with the patient including all alternatives, risks, complications.  -Debrided the calluses x6 without complications or bleeding.  It was tender to trim the calluses on the right foot she had pain with this.  Continue moisturizer, offloading.-Nails debrided T03 without complications or bleeding. -Discussed daily foot inspection  RTC 3 months or sooner if needed  Trula Slade DPM

## 2022-04-18 ENCOUNTER — Encounter: Payer: Self-pay | Admitting: Internal Medicine

## 2022-04-29 DIAGNOSIS — Z1231 Encounter for screening mammogram for malignant neoplasm of breast: Secondary | ICD-10-CM | POA: Diagnosis not present

## 2022-04-29 LAB — HM MAMMOGRAPHY

## 2022-05-03 ENCOUNTER — Encounter: Payer: Self-pay | Admitting: Internal Medicine

## 2022-05-09 ENCOUNTER — Encounter: Payer: Self-pay | Admitting: Internal Medicine

## 2022-05-17 ENCOUNTER — Ambulatory Visit: Payer: Medicare Other | Admitting: Internal Medicine

## 2022-05-25 ENCOUNTER — Telehealth: Payer: Self-pay | Admitting: Podiatry

## 2022-05-25 NOTE — Telephone Encounter (Signed)
Patient presents to the office today with issues concerning the diabetic shoes picked up on 04/13/22.   The shoes feel too small.  We will send the [employer]ofeet brand, style 844, size 7.5W back.   Reorder: none  Patient no longer wants shoes or inserts and she would not like to reorder at this time.

## 2022-06-21 ENCOUNTER — Encounter: Payer: Self-pay | Admitting: Internal Medicine

## 2022-06-21 ENCOUNTER — Ambulatory Visit (INDEPENDENT_AMBULATORY_CARE_PROVIDER_SITE_OTHER): Payer: Medicare Other | Admitting: Internal Medicine

## 2022-06-21 VITALS — BP 120/76 | HR 80 | Temp 97.9°F | Ht 60.0 in | Wt 144.0 lb

## 2022-06-21 DIAGNOSIS — G8929 Other chronic pain: Secondary | ICD-10-CM | POA: Diagnosis not present

## 2022-06-21 DIAGNOSIS — M25512 Pain in left shoulder: Secondary | ICD-10-CM | POA: Diagnosis not present

## 2022-06-21 MED ORDER — METHYLPREDNISOLONE ACETATE 80 MG/ML IJ SUSP
80.0000 mg | Freq: Once | INTRAMUSCULAR | Status: AC
  Administered 2022-06-21: 80 mg via INTRAMUSCULAR

## 2022-06-21 NOTE — Progress Notes (Signed)
Subjective:  Patient ID: Cindy Simpson, female    DOB: April 13, 1946  Age: 76 y.o. MRN: [id number]  CC: Follow-up (Left arm painful)   HPI Cindy Simpson presents for L arm pain in the shoulder; worse after a COVID booster in Oct 2023. C/o weakness   Outpatient Medications Prior to Visit  Medication Sig Dispense Refill   Aspirin 81 MG CAPS Take by mouth.     atorvastatin (LIPITOR) 20 MG tablet Take 20 mg by mouth daily.     bismuth subsalicylate (PEPTO BISMOL) 262 MG/15ML suspension Take 30 mLs by mouth every 6 (six) hours as needed for indigestion.     carvedilol (COREG) 6.25 MG tablet TAKE 1 TABLET BY MOUTH TWICE  DAILY WITH A MEAL 200 tablet 2   cetirizine (ZYRTEC) 10 MG tablet Take 10 mg by mouth at bedtime.     Cholecalciferol 1000 UNITS tablet Take 1,000 Units by mouth daily after supper.      Continuous Blood Gluc Receiver (FREESTYLE LIBRE 2 READER) DEVI 1 Units by Does not apply route every 14 (fourteen) days. 1 each 0   Continuous Blood Gluc Sensor (FREESTYLE LIBRE 2 SENSOR) MISC 1 Units by Does not apply route every 14 (fourteen) days. 6 each 3   diclofenac sodium (VOLTAREN) 1 % GEL Apply 2 g topically 4 (four) times daily. 100 g 3   FLUZONE HIGH-DOSE QUADRIVALENT 0.7 ML SUSY      glucose blood (ACCU-CHEK AVIVA) test strip Use to check blood sugars twice a day 100 each 5   Lancets (ACCU-CHEK SOFT TOUCH) lancets Use to check blood sugars twice a day 100 each 5   levothyroxine (SYNTHROID) 100 MCG tablet Take 100 mcg by mouth daily.     meloxicam (MOBIC) 7.5 MG tablet Take 1 tablet (7.5 mg total) by mouth daily. 14 tablet 0   pantoprazole (PROTONIX) 40 MG tablet TAKE 1 TABLET BY MOUTH TWICE  DAILY 200 tablet 2   valsartan (DIOVAN) 80 MG tablet TAKE 1 TABLET BY MOUTH  DAILY 100 tablet 2   famotidine (PEPCID) 20 MG tablet Take 1 tablet (20 mg total) by mouth at bedtime. (Patient not taking: Reported on 06/21/2022) 90 tablet 3   lovastatin (MEVACOR) 10 MG tablet TAKE 1 TABLET BY  MOUTH  DAILY (Patient not taking: Reported on 06/21/2022) 90 tablet 3   TURMERIC PO Take 1 tablet by mouth daily.  (Patient not taking: Reported on 06/21/2022)     No facility-administered medications prior to visit.    ROS: Review of Systems  Constitutional:  Negative for activity change, appetite change, chills, fatigue and unexpected weight change.  HENT:  Negative for congestion, mouth sores and sinus pressure.   Eyes:  Negative for visual disturbance.  Respiratory:  Negative for cough and chest tightness.   Gastrointestinal:  Negative for abdominal pain and nausea.  Genitourinary:  Negative for difficulty urinating, frequency and vaginal pain.  Musculoskeletal:  Positive for arthralgias and neck stiffness. Negative for back pain, gait problem and neck pain.  Skin:  Negative for pallor and rash.  Neurological:  Negative for dizziness, tremors, weakness, numbness and headaches.  Psychiatric/Behavioral:  Negative for confusion and sleep disturbance.     Objective:  BP 120/76 (BP Location: Left Arm, Patient Position: Sitting, Cuff Size: Normal)   Pulse 80   Temp 97.9 F (36.6 C) (Oral)   Ht 5' (1.524 m)   Wt 144 lb (65.3 kg)   SpO2 98%   BMI 28.12 kg/m  BP Readings from Last 3 Encounters:  06/21/22 120/76  01/12/22 110/68  10/12/21 118/70    Wt Readings from Last 3 Encounters:  06/21/22 144 lb (65.3 kg)  01/12/22 143 lb (64.9 kg)  10/12/21 150 lb (68 kg)    Physical Exam Constitutional:      General: She is not in acute distress.    Appearance: She is well-developed. She is obese.  HENT:     Head: Normocephalic.     Right Ear: External ear normal.     Left Ear: External ear normal.     Nose: Nose normal.  Eyes:     General:        Right eye: No discharge.        Left eye: No discharge.     Conjunctiva/sclera: Conjunctivae normal.     Pupils: Pupils are equal, round, and reactive to light.  Neck:     Thyroid: No thyromegaly.     Vascular: No JVD.      Trachea: No tracheal deviation.  Cardiovascular:     Rate and Rhythm: Normal rate and regular rhythm.     Heart sounds: Normal heart sounds.  Pulmonary:     Effort: No respiratory distress.     Breath sounds: No stridor. No wheezing.  Abdominal:     General: Bowel sounds are normal. There is no distension.     Palpations: Abdomen is soft. There is no mass.     Tenderness: There is no abdominal tenderness. There is no guarding or rebound.  Musculoskeletal:        General: No tenderness.     Cervical back: Normal range of motion and neck supple. No rigidity.  Lymphadenopathy:     Cervical: No cervical adenopathy.  Skin:    Findings: No erythema or rash.  Neurological:     Cranial Nerves: No cranial nerve deficit.     Motor: No abnormal muscle tone.     Coordination: Coordination normal.     Deep Tendon Reflexes: Reflexes normal.  Psychiatric:        Behavior: Behavior normal.        Thought Content: Thought content normal.        Judgment: Judgment normal.    B shoulder pain w/ROM L>>R MS OK DTRs/MS ok   Procedure :Joint Injection,  L shoulder   Indication:  Subacromial bursitis with refractory  chronic pain.   Risks including unsuccessful procedure , bleeding, infection, bruising, skin atrophy, "steroid flare-up" and others were explained to the patient in detail as well as the benefits. Informed consent was obtained and signed.   Tthe patient was placed in a comfortable position. Lateral approach was used. Skin was prepped with Betadine and alcohol  and anesthetized with a cooling spray. Then, a 5 cc syringe with a 2 inch long 24-gauge needle was used for a joint injection. The needle was advanced  Into the subacromial space.The bursa was injected with 3 mL of 2% lidocaine and 80 mg of Depo-Medrol .  Band-Aid was applied.   Tolerated well. Complications: None. Good pain relief following the procedure.   Postprocedure instructions :    A Band-Aid should be left on for 12  hours. Injection therapy is not a cure itself. It is used in conjunction with other modalities. You can use nonsteroidal anti-inflammatories like ibuprofen , hot and cold compresses. Rest is recommended in the next 24 hours. You need to report immediately  if fever, chills or any signs of infection develop.  Lab Results  Component Value Date   WBC 6.6 01/12/2022   HGB 11.5 (L) 01/12/2022   HCT 34.8 (L) 01/12/2022   PLT 307.0 01/12/2022   GLUCOSE 90 01/12/2022   CHOL 160 10/12/2021   TRIG 93 10/12/2021   HDL 59 10/12/2021   LDLDIRECT 137.5 03/17/2009   LDLCALC 84 10/12/2021   ALT 14 01/12/2022   AST 18 01/12/2022   NA 135 01/12/2022   K 4.9 01/12/2022   CL 100 01/12/2022   CREATININE 1.11 01/12/2022   BUN 22 01/12/2022   CO2 28 01/12/2022   TSH 15.32 (H) 01/12/2022   INR 0.99 03/03/2016   HGBA1C 6.1 01/12/2022    CT ABDOMEN PELVIS W CONTRAST  Result Date: 03/28/2019 CLINICAL DATA:  Acute generalized abdominal pain. EXAM: CT ABDOMEN AND PELVIS WITH CONTRAST TECHNIQUE: Multidetector CT imaging of the abdomen and pelvis was performed using the standard protocol following bolus administration of intravenous contrast. CONTRAST:  OMNIPAQUE IOHEXOL 300 MG/ML  SOLN COMPARISON:  CT scan of July 06, 2018. FINDINGS: Lower chest: No acute abnormality. Hepatobiliary: No gallstones are noted. Dilated gallbladder is noted. No biliary dilatation is noted. No hepatic abnormality is noted. Pancreas: Unremarkable. No pancreatic ductal dilatation or surrounding inflammatory changes. Spleen: Normal in size without focal abnormality. Adrenals/Urinary Tract: Adrenal glands appear normal. Stable large right renal cyst is noted. No hydronephrosis or renal obstruction is noted. No renal or ureteral calculi are noted. Urinary bladder is unremarkable. Stomach/Bowel: Status post hiatal hernia repair. There is no evidence of bowel obstruction or inflammation. The appendix appears normal. Diverticulosis of  descending and sigmoid colon is noted without inflammation. Vascular/Lymphatic: Aortic atherosclerosis. No enlarged abdominal or pelvic lymph nodes. Reproductive: Status post hysterectomy. No adnexal masses. Other: Small fat containing periumbilical and epigastric ventral hernias are noted. No ascites is noted. Musculoskeletal: No acute or significant osseous findings. IMPRESSION: Dilated gallbladder is noted without inflammation or cholelithiasis. No biliary dilatation or hepatic abnormality is noted. Diverticulosis of descending and sigmoid colon is noted without inflammation. Small fat containing periumbilical and epigastric ventral hernias are noted. Aortic Atherosclerosis (ICD10-I70.0). Electronically Signed   By: Lupita Raider M.D.   On: 03/28/2019 15:22    Assessment & Plan:   Problem List Items Addressed This Visit     Shoulder pain, left - Primary    Worse Options discussed ROM exercises Asper cream Will inject - see procedure         No orders of the defined types were placed in this encounter.     Follow-up: Return in about 3 months (around 09/20/2022) for a follow-up visit.  [name], MD

## 2022-06-21 NOTE — Assessment & Plan Note (Signed)
Worse Options discussed ROM exercises Asper cream Will inject - see procedure

## 2022-06-21 NOTE — Addendum Note (Signed)
Addended by: Basil Dess on: 06/21/2022 10:42 AM   Modules accepted: Orders

## 2022-07-05 DIAGNOSIS — H26493 Other secondary cataract, bilateral: Secondary | ICD-10-CM | POA: Diagnosis not present

## 2022-07-05 DIAGNOSIS — Z961 Presence of intraocular lens: Secondary | ICD-10-CM | POA: Diagnosis not present

## 2022-07-05 DIAGNOSIS — H35033 Hypertensive retinopathy, bilateral: Secondary | ICD-10-CM | POA: Diagnosis not present

## 2022-07-05 DIAGNOSIS — H04123 Dry eye syndrome of bilateral lacrimal glands: Secondary | ICD-10-CM | POA: Diagnosis not present

## 2022-07-15 ENCOUNTER — Ambulatory Visit: Payer: Medicare Other | Admitting: Podiatry

## 2022-07-15 DIAGNOSIS — B351 Tinea unguium: Secondary | ICD-10-CM | POA: Diagnosis not present

## 2022-07-15 DIAGNOSIS — M79675 Pain in left toe(s): Secondary | ICD-10-CM

## 2022-07-15 DIAGNOSIS — E1149 Type 2 diabetes mellitus with other diabetic neurological complication: Secondary | ICD-10-CM | POA: Diagnosis not present

## 2022-07-15 DIAGNOSIS — M79674 Pain in right toe(s): Secondary | ICD-10-CM | POA: Diagnosis not present

## 2022-07-15 DIAGNOSIS — Q828 Other specified congenital malformations of skin: Secondary | ICD-10-CM | POA: Diagnosis not present

## 2022-07-20 NOTE — Progress Notes (Signed)
Subjective: Chief Complaint  Patient presents with   foot care    [facility]    77 year old female presents the office today for evaluation of painful calluses as well as toenails.  She has no new concerns today.  Denies any open lesions.   Objective: AAO x3, NAD DP/PT pulses palpable bilaterally, CRT less than 3 seconds  Hyperkeratotic lesion submetatarsal 1, 3, 5 bilaterally.  No ongoing ulceration drainage or signs of infection.  There is no underlying ulceration drainage or any signs of infection. The nails are hypertrophic, dystrophic, discolored x 10 and are causing irritation/pain. No redness, swelling, drainage.  No open lesions or pre-ulcerative lesions.  Bunions present. Prominent metatarsal heads with atrophy of the fat pad.  No pain with calf compression, swelling, warmth, erythema  Assessment: Hyperkeratotic lesions/preulcerative calluses, symptomatic onychomycosis  Plan: -All treatment options discussed with the patient including all alternatives, risks, complications.  -Debrided the calluses x 6 without complications or bleeding.  It was tender to trim the calluses on the right foot she had pain with this.  Continue moisturizer, offloading. -Nails debrided W97 without complications or bleeding. -Discussed daily foot inspection  Return in about 3 months (around 10/14/2022).  Trula Slade DPM

## 2022-08-26 ENCOUNTER — Ambulatory Visit (INDEPENDENT_AMBULATORY_CARE_PROVIDER_SITE_OTHER): Payer: Medicare Other

## 2022-08-26 VITALS — Ht 60.0 in | Wt 140.0 lb

## 2022-08-26 DIAGNOSIS — Z0000 Encounter for general adult medical examination without abnormal findings: Secondary | ICD-10-CM | POA: Diagnosis not present

## 2022-08-26 NOTE — Progress Notes (Cosign Needed Addendum)
Virtual Visit via Telephone Note  I connected with  Cindy Simpson on 08/26/22 at  1:00 PM EST by telephone and verified that I am speaking with the correct person using two identifiers.  Location: Patient: Home Provider: [place] Persons participating in the virtual visit: [place]   I discussed the limitations, risks, security and privacy concerns of performing an evaluation and management service by telephone and the availability of in person appointments. The patient expressed understanding and agreed to proceed.  Interactive audio and video telecommunications were attempted between this nurse and patient, however failed, due to patient having technical difficulties OR patient did not have access to video capability.  We continued and completed visit with audio only.  Some vital signs may be absent or patient reported.   Sheral Flow, LPN  Subjective:   Cindy Simpson is a 77 y.o. female who presents for Medicare Annual (Subsequent) preventive examination.  Review of Systems     Cardiac Risk Factors include: advanced age (>6mn, >>88women);hypertension;dyslipidemia;sedentary lifestyle;family history of premature cardiovascular disease     Objective:    Today's Vitals   08/26/22 1303  Weight: 140 lb (63.5 kg)  Height: 5' (1.524 m)  PainSc: 0-No pain   Body mass index is 27.34 kg/m.     08/26/2022    1:05 PM 08/25/2021    1:45 PM 08/21/2020    1:10 PM 03/28/2019   12:23 PM 07/05/2018    9:57 PM 04/05/2017    3:57 PM 07/20/2016    8:50 PM  Advanced Directives  Does Patient Have a Medical Advance Directive? No No No No No No No  Would patient like information on creating a medical advance directive? No - Patient declined No - Patient declined No - Patient declined        Current Medications (verified) Outpatient Encounter Medications as of 08/26/2022  Medication Sig   Aspirin 81 MG CAPS Take by mouth.   atorvastatin (LIPITOR) 20 MG  tablet Take 20 mg by mouth daily.   bismuth subsalicylate (PEPTO BISMOL) 262 MG/15ML suspension Take 30 mLs by mouth every 6 (six) hours as needed for indigestion.   carvedilol (COREG) 6.25 MG tablet TAKE 1 TABLET BY MOUTH TWICE  DAILY WITH A MEAL   cetirizine (ZYRTEC) 10 MG tablet Take 10 mg by mouth at bedtime.   Cholecalciferol 1000 UNITS tablet Take 1,000 Units by mouth daily after supper.    Continuous Blood Gluc Receiver (FREESTYLE LIBRE 2 READER) DEVI 1 Units by Does not apply route every 14 (fourteen) days.   Continuous Blood Gluc Sensor (FREESTYLE LIBRE 2 SENSOR) MISC 1 Units by Does not apply route every 14 (fourteen) days.   diclofenac sodium (VOLTAREN) 1 % GEL Apply 2 g topically 4 (four) times daily.   famotidine (PEPCID) 20 MG tablet Take 1 tablet (20 mg total) by mouth at bedtime.   FLUZONE HIGH-DOSE QUADRIVALENT 0.7 ML SUSY    glucose blood (ACCU-CHEK AVIVA) test strip Use to check blood sugars twice a day   Lancets (ACCU-CHEK SOFT TOUCH) lancets Use to check blood sugars twice a day   levothyroxine (SYNTHROID) 100 MCG tablet Take 100 mcg by mouth daily.   lovastatin (MEVACOR) 10 MG tablet TAKE 1 TABLET BY MOUTH  DAILY   meloxicam (MOBIC) 7.5 MG tablet Take 1 tablet (7.5 mg total) by mouth daily.   pantoprazole (PROTONIX) 40 MG tablet TAKE 1 TABLET BY MOUTH TWICE  DAILY   TURMERIC PO Take 1 tablet  by mouth daily.   valsartan (DIOVAN) 80 MG tablet TAKE 1 TABLET BY MOUTH  DAILY   No facility-administered encounter medications on file as of 08/26/2022.    Allergies (verified) Lisinopril, Vinegar [acetic acid], and Metoclopramide hcl   History: Past Medical History:  Diagnosis Date   Allergic rhinitis    Allergy    Anemia    Asthmatic bronchitis 2009   allergies; post nasal drip but not allergies   GERD (gastroesophageal reflux disease)    History of hiatal hernia    Hyperlipidemia    Hypertension    Hypothyroidism    IBS (irritable bowel syndrome)    Insomnia     Irregular heart beat Not sure; long time ago   Periodic irregular heart rate; take verapamil.   Status post dilation of esophageal narrowing    Torticollis 2006   Estimate date; rec'd botox and resolved   Past Surgical History:  Procedure Laterality Date   ABDOMINAL HYSTERECTOMY     with ovaries removed also.   CARDIAC CATHETERIZATION N/A 09/13/2015   Procedure: Left Heart Cath and Coronary Angiography;  Surgeon: Charolette Forward, MD;  Location: [place] [facility];  Service: Cardiovascular;  Laterality: N/A;   CATARACT EXTRACTION Bilateral 2014   approx 2 years ago   CATARACT EXTRACTION     [place]     child   COLONOSCOPY     ESOPHAGOGASTRODUODENOSCOPY (EGD) WITH PROPOFOL N/A 12/30/2015   Procedure: ESOPHAGOGASTRODUODENOSCOPY (EGD) WITH PROPOFOL;  Surgeon: Doran Stabler, MD;  Location: [facility];  Service: Gastroenterology;  Laterality: N/A;   HIATAL HERNIA REPAIR N/A 03/09/2016   Procedure: LAPAROSCOPIC REPAIR OF HIATAL HERNIA  NISSEN FUNDOPLICATION UPPER ENDOSCOPY;  Surgeon: Jackolyn Confer, MD;  Location: [facility];  Service: General;  Laterality: N/A;   NISSEN FUNDOPLICATION N/A [id number]   Procedure: NISSEN FUNDOPLICATION;  Surgeon: Jackolyn Confer, MD;  Location: [facility];  Service: General;  Laterality: N/A;   TUBAL LIGATION Bilateral 1985   UPPER GI ENDOSCOPY  03/09/2016   Procedure: UPPER GI ENDOSCOPY;  Surgeon: Jackolyn Confer, MD;  Location: [facility];  Service: General;;   Family History  Problem Relation Age of Onset   Hypertension Mother    Heart disease Father    Stroke Father    Stroke Brother        brain aneurism   Colon polyps Sister    Other Sister        c diff-deceased   Stomach cancer Neg Hx    Rectal cancer Neg Hx    Esophageal cancer Neg Hx    Colon cancer Neg Hx    Social History   Socioeconomic History   Marital status: Married    Spouse name: Not on file   Number of children: 0   Years of education: Not on file   Highest education  level: Not on file  Occupational History   Occupation: Retired  Tobacco Use   Smoking status: Never   Smokeless tobacco: Never  Vaping Use   Vaping Use: Never used  Substance and Sexual Activity   Alcohol use: Yes    Alcohol/week: 1.0 - 2.0 standard drink of alcohol    Types: 1 - 2 Shots of liquor per week    Comment: Perhaps every couple of weeks; not very frequently   Drug use: No   Sexual activity: Never  Other Topics Concern   Not on file  Social History Narrative   Not on file   Social Determinants of  Health   Financial Resource Strain: Low Risk  (08/26/2022)   Overall Financial Resource Strain (CARDIA)    Difficulty of Paying Living Expenses: Not hard at all  Food Insecurity: No Food Insecurity (08/26/2022)   Hunger Vital Sign    Worried About Running Out of Food in the Last Year: Never true    Ran Out of Food in the Last Year: Never true  Transportation Needs: No Transportation Needs (08/26/2022)   PRAPARE - Hydrologist (Medical): No    Lack of Transportation (Non-Medical): No  Physical Activity: Inactive (08/26/2022)   Exercise Vital Sign    Days of Exercise per Week: 0 days    Minutes of Exercise per Session: 0 min  Stress: No Stress Concern Present (08/26/2022)   [place]    Feeling of Stress : Not at all  Social Connections: Unknown (08/26/2022)   Social Connection and Isolation Panel [NHANES]    Frequency of Communication with Friends and Family: More than three times a week    Frequency of Social Gatherings with Friends and Family: Once a week    Attends Religious Services: Patient refused    Marine scientist or Organizations: No    Attends Music therapist: Patient refused    Marital Status: Married    Tobacco Counseling Counseling given: Not Answered   Clinical Intake:  Pre-visit preparation completed: Yes  Pain : No/denies pain Pain Score:  0-No pain     BMI - recorded: 27.34 Nutritional Status: BMI 25 -29 Overweight Nutritional Risks: None Diabetes: No  How often do you need to have someone help you when you read instructions, pamphlets, or other written materials from your doctor or pharmacy?: 1 - Never What is the last grade level you completed in school?: HSG  Diabetic? Patient is prediabetic.  Interpreter Needed?: No  Information entered by :: Lisette Abu, LPN.   Activities of Daily Living    08/26/2022    1:08 PM  In your present state of health, do you have any difficulty performing the following activities:  Hearing? 0  Vision? 0  Difficulty concentrating or making decisions? 0  Walking or climbing stairs? 0  Dressing or bathing? 0  Doing errands, shopping? 0  Preparing Food and eating ? N  Using the Toilet? N  In the past six months, have you accidently leaked urine? Y  Comment wear protection  Do you have problems with loss of bowel control? N  Managing your Medications? N  Managing your Finances? N  Housekeeping or managing your Housekeeping? N    Patient Care Team: [name], Evie Lacks, MD as PCP - General Danis, Kirke Corin, MD as Consulting Physician (Gastroenterology) Charolette Forward, MD as Consulting Physician (Cardiology) Lonia Skinner, MD as Consulting Physician (Ophthalmology)  Indicate any recent Medical Services you may have received from other than [facility] providers in the past year (date may be approximate).     Assessment:   This is a routine wellness examination for Cindy Simpson.  Hearing/Vision screen Hearing Screening - Comments:: Denies hearing difficulties; no hearing aids.  Vision Screening - Comments:: No eyeglasses - up to date with routine eye exams with Gala Romney, MD.   Dietary issues and exercise activities discussed: Current Exercise Habits: The patient does not participate in regular exercise at present (Patient stated that she does not exercise during the  winter months.), Exercise limited by: respiratory conditions(s)   Goals  Addressed             This Visit's Progress    My healthcare goal for 2024 is to maintain my current health status by continuing to eat healthy, independent, stay physically active and socially active.        Depression Screen    08/26/2022    1:11 PM 06/21/2022    9:58 AM 01/12/2022    9:06 AM 08/25/2021    1:49 PM 08/21/2020    1:03 PM 05/09/2020    9:10 AM 04/04/2019    9:34 AM  PHQ 2/9 Scores  PHQ - 2 Score 0 0 0 0 0 0 0  PHQ- 9 Score  3         Fall Risk    08/26/2022    1:06 PM 06/21/2022    9:57 AM 01/12/2022    9:05 AM 08/25/2021    1:46 PM 08/21/2020    1:10 PM  [place] in the past year? 0 1 0 1 0  Number falls in past yr: 0 0 0 0 0  Injury with Fall? 0 0 0 0 0  Risk for fall due to : No Fall Risks History of fall(s) No Fall Risks  No Fall Risks  Follow up Falls prevention discussed Falls evaluation completed Falls evaluation completed  Falls evaluation completed    [place]:  Any stairs in or around the home? No  If so, are there any without handrails? No  Home free of loose throw rugs in walkways, pet beds, electrical cords, etc? Yes  Adequate lighting in your home to reduce risk of falls? Yes   ASSISTIVE DEVICES UTILIZED TO PREVENT FALLS:  Life alert? No  Use of a cane, walker or w/c? No  Grab bars in the bathroom? Yes  Shower chair or bench in shower? Yes  Elevated toilet seat or a handicapped toilet? No   TIMED UP AND GO:  Was the test performed? No . Phone Visit   Cognitive Function:        08/26/2022    1:10 PM  6CIT Screen  What Year? 0 points  What month? 0 points  What time? 0 points  Count back from 20 0 points  Months in reverse 0 points  Repeat phrase 0 points  Total Score 0 points    Immunizations Immunization History  Administered Date(s) Administered   Fluad Quad(high Dose 65+) 04/25/2021, 03/25/2022   Influenza  Split 04/03/2012   Influenza Whole 03/24/2010, 04/19/2011   Influenza, High Dose Seasonal PF 03/30/2016, 03/25/2017, 04/10/2018, 03/15/2019   Influenza-Unspecified 03/19/2013, 04/25/2020   Moderna Covid-19 Vaccine Bivalent Booster 72yr & up 05/06/2022   PFIZER(Purple Top)SARS-COV-2 Vaccination 07/28/2019, 08/18/2019, 04/01/2020   Pfizer Covid-19 Vaccine Bivalent Booster 122yr& up 04/11/2021   Pneumococcal Conjugate-13 08/14/2013   Pneumococcal Polysaccharide-23 04/16/2011   Td 09/15/2009   Tdap 01/28/2020   Zoster Recombinat (Shingrix) 05/09/2018, 07/07/2018    TDAP status: Up to date  Flu Vaccine status: Up to date  Pneumococcal vaccine status: Up to date  Covid-19 vaccine status: Completed vaccines  Qualifies for Shingles Vaccine? Yes   Zostavax completed No   Shingrix Completed?: Yes  Screening Tests Health Maintenance  Topic Date Due   Diabetic kidney evaluation - Urine ACR  Never done   Hepatitis C Screening  Never done   COVID-19 Vaccine (6 - 2023-24 season) 07/01/2022   Diabetic kidney evaluation - eGFR measurement  01/13/2023  Medicare Annual Wellness (AWV)  08/27/2023   DTaP/Tdap/Td (3 - Td or Tdap) 01/27/2030   Pneumonia Vaccine 77+ Years old  Completed   INFLUENZA VACCINE  Completed   DEXA SCAN  Completed   Zoster Vaccines- Shingrix  Completed   HPV VACCINES  Aged Out   Fecal DNA (Cologuard)  Discontinued    Health Maintenance  Health Maintenance Due  Topic Date Due   Diabetic kidney evaluation - Urine ACR  Never done   Hepatitis C Screening  Never done   COVID-19 Vaccine (6 - 2023-24 season) 07/01/2022    Colorectal cancer screening: No longer required.   Mammogram status: Completed 04/29/2022. Repeat every year  Bone Density status: Completed 12/20/2014. Results reflect: Bone density results: OSTEOPENIA. Repeat every 2-3 years.  Lung Cancer Screening: (Low Dose CT Chest recommended if Age 52-80 years, 30 pack-year currently smoking OR have quit  w/in 15years.) does not qualify.   Lung Cancer Screening Referral: no  Additional Screening:  Hepatitis C Screening: does qualify; Completed no  Vision Screening: Recommended annual ophthalmology exams for early detection of glaucoma and other disorders of the eye. Is the patient up to date with their annual eye exam?  Yes  Who is the provider or what is the name of the office in which the patient attends annual eye exams? Gala Romney, MD. If pt is not established with a provider, would they like to be referred to a provider to establish care? No .   Dental Screening: Recommended annual dental exams for proper oral hygiene  Community Resource Referral / Chronic Care Management: CRR required this visit?  No   CCM required this visit?  No      Plan:     I have personally reviewed and noted the following in the patient's chart:   Medical and social history Use of alcohol, tobacco or illicit drugs  Current medications and supplements including opioid prescriptions. Patient is not currently taking opioid prescriptions. Functional ability and status Nutritional status Physical activity Advanced directives List of other physicians Hospitalizations, surgeries, and ER visits in previous 12 months Vitals Screenings to include cognitive, depression, and falls Referrals and appointments  In addition, I have reviewed and discussed with patient certain preventive protocols, quality metrics, and best practice recommendations. A written personalized care plan for preventive services as well as general preventive health recommendations were provided to patient.     Sheral Flow, LPN   [id number]   Nurse Notes: N/A   Medical screening examination/treatment/procedure(s) were performed by non-physician practitioner and as supervising physician I was immediately available for consultation/collaboration.  I agree with above. Lew Dawes, MD

## 2022-08-26 NOTE — Patient Instructions (Addendum)
Cindy Simpson , Thank you for taking time to come for your Medicare Wellness Visit. I appreciate your ongoing commitment to your health goals. Please review the following plan we discussed and let me know if I can assist you in the future.   These are the goals we discussed:  Goals      My healthcare goal for 2024 is to maintain my current health status by continuing to eat healthy, independent, stay physically active and socially active.        This is a list of the screening recommended for you and due dates:  Health Maintenance  Topic Date Due   Yearly kidney health urinalysis for diabetes  Never done   Hepatitis C Screening: USPSTF Recommendation to screen - Ages 62-79 yo.  Never done   COVID-19 Vaccine (6 - 2023-24 season) 07/01/2022   Yearly kidney function blood test for diabetes  01/13/2023   Medicare Annual Wellness Visit  08/27/2023   DTaP/Tdap/Td vaccine (3 - Td or Tdap) 01/27/2030   Pneumonia Vaccine  Completed   Flu Shot  Completed   DEXA scan (bone density measurement)  Completed   Zoster (Shingles) Vaccine  Completed   HPV Vaccine  Aged Out   Cologuard (Stool DNA test)  Discontinued    Advanced directives: Yes; Please bring a copy of your health care power of attorney and living will to the office at your convenience.  Conditions/risks identified: Yes  Next appointment: Follow up in one year for your annual wellness visit.   Preventive Care 59 Years and Older, Female Preventive care refers to lifestyle choices and visits with your health care provider that can promote health and wellness. What does preventive care include? A yearly physical exam. This is also called an annual well check. Dental exams once or twice a year. Routine eye exams. Ask your health care provider how often you should have your eyes checked. Personal lifestyle choices, including: Daily care of your teeth and gums. Regular physical activity. Eating a healthy diet. Avoiding tobacco and  drug use. Limiting alcohol use. Practicing safe sex. Taking low-dose aspirin every day. Taking vitamin and mineral supplements as recommended by your health care provider. What happens during an annual well check? The services and screenings done by your health care provider during your annual well check will depend on your age, overall health, lifestyle risk factors, and family history of disease. Counseling  Your health care provider may ask you questions about your: Alcohol use. Tobacco use. Drug use. Emotional well-being. Home and relationship well-being. Sexual activity. Eating habits. History of falls. Memory and ability to understand (cognition). Work and work Statistician. Reproductive health. Screening  You may have the following tests or measurements: Height, weight, and BMI. Blood pressure. Lipid and cholesterol levels. These may be checked every 5 years, or more frequently if you are over 49 years old. Skin check. Lung cancer screening. You may have this screening every year starting at age 32 if you have a 30-pack-year history of smoking and currently smoke or have quit within the past 15 years. Fecal occult blood test (FOBT) of the stool. You may have this test every year starting at age 79. Flexible sigmoidoscopy or colonoscopy. You may have a sigmoidoscopy every 5 years or a colonoscopy every 10 years starting at age 32. Hepatitis C blood test. Hepatitis B blood test. Sexually transmitted disease (STD) testing. Diabetes screening. This is done by checking your blood sugar (glucose) after you have not eaten for a while (  fasting). You may have this done every 1-3 years. Bone density scan. This is done to screen for osteoporosis. You may have this done starting at age 85. Mammogram. This may be done every 1-2 years. Talk to your health care provider about how often you should have regular mammograms. Talk with your health care provider about your test results, treatment  options, and if necessary, the need for more tests. Vaccines  Your health care provider may recommend certain vaccines, such as: Influenza vaccine. This is recommended every year. Tetanus, diphtheria, and acellular pertussis (Tdap, Td) vaccine. You may need a Td booster every 10 years. Zoster vaccine. You may need this after age 37. Pneumococcal 13-valent conjugate (PCV13) vaccine. One dose is recommended after age 36. Pneumococcal polysaccharide (PPSV23) vaccine. One dose is recommended after age 56. Talk to your health care provider about which screenings and vaccines you need and how often you need them. This information is not intended to replace advice given to you by your health care provider. Make sure you discuss any questions you have with your health care provider. Document Released: 08/01/2015 Document Revised: 03/24/2016 Document Reviewed: 05/06/2015 Elsevier Interactive Patient Education  2017 [employer] Prevention in the Home Falls can cause injuries. They can happen to people of all ages. There are many things you can do to make your home safe and to help prevent falls. What can I do on the outside of my home? Regularly fix the edges of walkways and driveways and fix any cracks. Remove anything that might make you trip as you walk through a door, such as a raised step or threshold. Trim any bushes or trees on the path to your home. Use bright outdoor lighting. Clear any walking paths of anything that might make someone trip, such as rocks or tools. Regularly check to see if handrails are loose or broken. Make sure that both sides of any steps have handrails. Any raised decks and porches should have guardrails on the edges. Have any leaves, snow, or ice cleared regularly. Use sand or salt on walking paths during winter. Clean up any spills in your garage right away. This includes oil or grease spills. What can I do in the bathroom? Use night lights. Install grab  bars by the toilet and in the tub and shower. Do not use towel bars as grab bars. Use non-skid mats or decals in the tub or shower. If you need to sit down in the shower, use a plastic, non-slip stool. Keep the floor dry. Clean up any water that spills on the floor as soon as it happens. Remove soap buildup in the tub or shower regularly. Attach bath mats securely with double-sided non-slip rug tape. Do not have throw rugs and other things on the floor that can make you trip. What can I do in the bedroom? Use night lights. Make sure that you have a light by your bed that is easy to reach. Do not use any sheets or blankets that are too big for your bed. They should not hang down onto the floor. Have a firm chair that has side arms. You can use this for support while you get dressed. Do not have throw rugs and other things on the floor that can make you trip. What can I do in the kitchen? Clean up any spills right away. Avoid walking on wet floors. Keep items that you use a lot in easy-to-reach places. If you need to reach something above you, use  a strong step stool that has a grab bar. Keep electrical cords out of the way. Do not use floor polish or wax that makes floors slippery. If you must use wax, use non-skid floor wax. Do not have throw rugs and other things on the floor that can make you trip. What can I do with my stairs? Do not leave any items on the stairs. Make sure that there are handrails on both sides of the stairs and use them. Fix handrails that are broken or loose. Make sure that handrails are as long as the stairways. Check any carpeting to make sure that it is firmly attached to the stairs. Fix any carpet that is loose or worn. Avoid having throw rugs at the top or bottom of the stairs. If you do have throw rugs, attach them to the floor with carpet tape. Make sure that you have a light switch at the top of the stairs and the bottom of the stairs. If you do not have them,  ask someone to add them for you. What else can I do to help prevent falls? Wear shoes that: Do not have high heels. Have rubber bottoms. Are comfortable and fit you well. Are closed at the toe. Do not wear sandals. If you use a stepladder: Make sure that it is fully opened. Do not climb a closed stepladder. Make sure that both sides of the stepladder are locked into place. Ask someone to hold it for you, if possible. Clearly mark and make sure that you can see: Any grab bars or handrails. First and last steps. Where the edge of each step is. Use tools that help you move around (mobility aids) if they are needed. These include: Canes. Walkers. Scooters. Crutches. Turn on the lights when you go into a dark area. Replace any light bulbs as soon as they burn out. Set up your furniture so you have a clear path. Avoid moving your furniture around. If any of your floors are uneven, fix them. If there are any pets around you, be aware of where they are. Review your medicines with your doctor. Some medicines can make you feel dizzy. This can increase your chance of falling. Ask your doctor what other things that you can do to help prevent falls. This information is not intended to replace advice given to you by your health care provider. Make sure you discuss any questions you have with your health care provider. Document Released: 05/01/2009 Document Revised: 12/11/2015 Document Reviewed: 08/09/2014 Elsevier Interactive Patient Education  2017 [employer].

## 2022-09-03 ENCOUNTER — Other Ambulatory Visit: Payer: Self-pay | Admitting: Internal Medicine

## 2022-09-21 ENCOUNTER — Ambulatory Visit: Payer: Medicare Other | Admitting: Internal Medicine

## 2022-09-29 ENCOUNTER — Ambulatory Visit (INDEPENDENT_AMBULATORY_CARE_PROVIDER_SITE_OTHER): Payer: Medicare Other | Admitting: Internal Medicine

## 2022-09-29 ENCOUNTER — Encounter: Payer: Self-pay | Admitting: Internal Medicine

## 2022-09-29 VITALS — BP 124/80 | HR 65 | Temp 97.9°F | Ht 60.0 in | Wt 145.0 lb

## 2022-09-29 DIAGNOSIS — S76011A Strain of muscle, fascia and tendon of right hip, initial encounter: Secondary | ICD-10-CM | POA: Insufficient documentation

## 2022-09-29 DIAGNOSIS — I10 Essential (primary) hypertension: Secondary | ICD-10-CM | POA: Diagnosis not present

## 2022-09-29 DIAGNOSIS — E034 Atrophy of thyroid (acquired): Secondary | ICD-10-CM | POA: Diagnosis not present

## 2022-09-29 DIAGNOSIS — D649 Anemia, unspecified: Secondary | ICD-10-CM | POA: Diagnosis not present

## 2022-09-29 DIAGNOSIS — N183 Chronic kidney disease, stage 3 unspecified: Secondary | ICD-10-CM | POA: Diagnosis not present

## 2022-09-29 LAB — COMPREHENSIVE METABOLIC PANEL
ALT: 16 U/L (ref 0–35)
AST: 21 U/L (ref 0–37)
Albumin: 4 g/dL (ref 3.5–5.2)
Alkaline Phosphatase: 106 U/L (ref 39–117)
BUN: 15 mg/dL (ref 6–23)
CO2: 29 mEq/L (ref 19–32)
Calcium: 9.6 mg/dL (ref 8.4–10.5)
Chloride: 100 mEq/L (ref 96–112)
Creatinine, Ser: 0.96 mg/dL (ref 0.40–1.20)
GFR: 57.18 mL/min — ABNORMAL LOW (ref >60.00)
Glucose, Bld: 95 mg/dL (ref 70–99)
Potassium: 4.6 mEq/L (ref 3.5–5.1)
Sodium: 136 mEq/L (ref 135–145)
Total Bilirubin: 0.5 mg/dL (ref 0.2–1.2)
Total Protein: 6.8 g/dL (ref 6.0–8.3)

## 2022-09-29 LAB — CBC WITH DIFFERENTIAL/PLATELET
Basophils Absolute: 0.1 10*3/uL (ref 0.0–0.1)
Basophils Relative: 0.8 % (ref 0.0–3.0)
Eosinophils Absolute: 0.3 10*3/uL (ref 0.0–0.7)
Eosinophils Relative: 3.2 % (ref 0.0–5.0)
HCT: 34.9 % — ABNORMAL LOW (ref 36.0–46.0)
Hemoglobin: 11.8 g/dL — ABNORMAL LOW (ref 12.0–15.0)
Lymphocytes Relative: 21.1 % (ref 12.0–46.0)
Lymphs Abs: 1.7 10*3/uL (ref 0.7–4.0)
MCHC: 33.7 g/dL (ref 30.0–36.0)
MCV: 92.8 fl (ref 78.0–100.0)
Monocytes Absolute: 0.7 10*3/uL (ref 0.1–1.0)
Monocytes Relative: 9 % (ref 3.0–12.0)
Neutro Abs: 5.4 10*3/uL (ref 1.4–7.7)
Neutrophils Relative %: 65.9 % (ref 43.0–77.0)
Platelets: 385 10*3/uL (ref 150.0–400.0)
RBC: 3.76 Mil/uL — ABNORMAL LOW (ref 3.87–5.11)
RDW: 13.2 % (ref 11.5–15.5)
WBC: 8.2 10*3/uL (ref 4.0–10.5)

## 2022-09-29 NOTE — Progress Notes (Signed)
Subjective:  Patient ID: Cindy Simpson, female    DOB: 04-Apr-1946  Age: 77 y.o. MRN: [id number]  CC: Follow-up (Recently pulled a hamstring )   HPI MIDA HLINKA presents for slipping on the ice cube at home, did not fall. C/o R posterior glut and thigh pain.  F/u HTN, dyslipidemia  Outpatient Medications Prior to Visit  Medication Sig Dispense Refill   Aspirin 81 MG CAPS Take by mouth.     atorvastatin (LIPITOR) 20 MG tablet Take 20 mg by mouth daily.     bismuth subsalicylate (PEPTO BISMOL) 262 MG/15ML suspension Take 30 mLs by mouth every 6 (six) hours as needed for indigestion.     carvedilol (COREG) 6.25 MG tablet TAKE 1 TABLET BY MOUTH TWICE  DAILY WITH A MEAL 200 tablet 2   cetirizine (ZYRTEC) 10 MG tablet Take 10 mg by mouth at bedtime.     Cholecalciferol 1000 UNITS tablet Take 1,000 Units by mouth daily after supper.      Continuous Blood Gluc Receiver (FREESTYLE LIBRE 2 READER) DEVI 1 Units by Does not apply route every 14 (fourteen) days. 1 each 0   Continuous Blood Gluc Sensor (FREESTYLE LIBRE 2 SENSOR) MISC 1 Units by Does not apply route every 14 (fourteen) days. 6 each 3   diclofenac sodium (VOLTAREN) 1 % GEL Apply 2 g topically 4 (four) times daily. 100 g 3   famotidine (PEPCID) 20 MG tablet Take 1 tablet (20 mg total) by mouth at bedtime. 90 tablet 3   FLUZONE HIGH-DOSE QUADRIVALENT 0.7 ML SUSY      glucose blood (ACCU-CHEK AVIVA) test strip Use to check blood sugars twice a day 100 each 5   Lancets (ACCU-CHEK SOFT TOUCH) lancets Use to check blood sugars twice a day 100 each 5   levothyroxine (SYNTHROID) 100 MCG tablet Take 100 mcg by mouth daily.     lovastatin (MEVACOR) 10 MG tablet TAKE 1 TABLET BY MOUTH  DAILY 90 tablet 3   meloxicam (MOBIC) 7.5 MG tablet Take 1 tablet (7.5 mg total) by mouth daily. 14 tablet 0   pantoprazole (PROTONIX) 40 MG tablet TAKE 1 TABLET BY MOUTH TWICE  DAILY 200 tablet 2   TURMERIC PO Take 1 tablet by mouth daily.     valsartan  (DIOVAN) 80 MG tablet Take 1 tablet (80 mg total) by mouth daily. Annual appt due in March must see provider for future refills 100 tablet 0   No facility-administered medications prior to visit.    ROS: Review of Systems  Constitutional:  Negative for activity change, appetite change, chills, fatigue and unexpected weight change.  HENT:  Negative for congestion, mouth sores and sinus pressure.   Eyes:  Negative for visual disturbance.  Respiratory:  Negative for cough and chest tightness.   Gastrointestinal:  Negative for abdominal pain and nausea.  Genitourinary:  Negative for difficulty urinating, frequency and vaginal pain.  Musculoskeletal:  Positive for arthralgias and gait problem. Negative for back pain.  Skin:  Negative for pallor and rash.  Neurological:  Negative for dizziness, tremors, weakness, numbness and headaches.  Psychiatric/Behavioral:  Negative for confusion and sleep disturbance.     Objective:  BP 124/80 (BP Location: Left Arm, Patient Position: Sitting, Cuff Size: Normal)   Pulse 65   Temp 97.9 F (36.6 C) (Oral)   Ht 5' (1.524 m)   Wt 145 lb (65.8 kg)   SpO2 98%   BMI 28.32 kg/m   BP Readings from Last 3  Encounters:  09/29/22 124/80  06/21/22 120/76  01/12/22 110/68    Wt Readings from Last 3 Encounters:  09/29/22 145 lb (65.8 kg)  08/26/22 140 lb (63.5 kg)  06/21/22 144 lb (65.3 kg)    Physical Exam Constitutional:      General: She is not in acute distress.    Appearance: She is well-developed.  HENT:     Head: Normocephalic.     Right Ear: External ear normal.     Left Ear: External ear normal.     Nose: Nose normal.  Eyes:     General:        Right eye: No discharge.        Left eye: No discharge.     Conjunctiva/sclera: Conjunctivae normal.     Pupils: Pupils are equal, round, and reactive to light.  Neck:     Thyroid: No thyromegaly.     Vascular: No JVD.     Trachea: No tracheal deviation.  Cardiovascular:     Rate and  Rhythm: Normal rate and regular rhythm.     Heart sounds: Normal heart sounds.  Pulmonary:     Effort: No respiratory distress.     Breath sounds: No stridor. No wheezing.  Abdominal:     General: Bowel sounds are normal. There is no distension.     Palpations: Abdomen is soft. There is no mass.     Tenderness: There is no abdominal tenderness. There is no guarding or rebound.  Musculoskeletal:        General: No tenderness.     Cervical back: Normal range of motion and neck supple. No rigidity.  Lymphadenopathy:     Cervical: No cervical adenopathy.  Skin:    Findings: No bruising, erythema or rash.  Neurological:     Mental Status: She is oriented to person, place, and time.     Cranial Nerves: No cranial nerve deficit.     Motor: No abnormal muscle tone.     Coordination: Coordination normal.     Gait: Gait abnormal.     Deep Tendon Reflexes: Reflexes normal.  Psychiatric:        Behavior: Behavior normal.        Thought Content: Thought content normal.        Judgment: Judgment normal.    R poster and lat glut+thigh - painful Hip ROM OK   Lab Results  Component Value Date   WBC 6.6 01/12/2022   HGB 11.5 (L) 01/12/2022   HCT 34.8 (L) 01/12/2022   PLT 307.0 01/12/2022   GLUCOSE 90 01/12/2022   CHOL 160 10/12/2021   TRIG 93 10/12/2021   HDL 59 10/12/2021   LDLDIRECT 137.5 03/17/2009   LDLCALC 84 10/12/2021   ALT 14 01/12/2022   AST 18 01/12/2022   NA 135 01/12/2022   K 4.9 01/12/2022   CL 100 01/12/2022   CREATININE 1.11 01/12/2022   BUN 22 01/12/2022   CO2 28 01/12/2022   TSH 15.32 (H) 01/12/2022   INR 0.99 03/03/2016   HGBA1C 6.1 01/12/2022    CT ABDOMEN PELVIS W CONTRAST  Result Date: 03/28/2019 CLINICAL DATA:  Acute generalized abdominal pain. EXAM: CT ABDOMEN AND PELVIS WITH CONTRAST TECHNIQUE: Multidetector CT imaging of the abdomen and pelvis was performed using the standard protocol following bolus administration of intravenous contrast. CONTRAST:   OMNIPAQUE IOHEXOL 300 MG/ML  SOLN COMPARISON:  CT scan of July 06, 2018. FINDINGS: Lower chest: No acute abnormality. Hepatobiliary: No gallstones are noted.  Dilated gallbladder is noted. No biliary dilatation is noted. No hepatic abnormality is noted. Pancreas: Unremarkable. No pancreatic ductal dilatation or surrounding inflammatory changes. Spleen: Normal in size without focal abnormality. Adrenals/Urinary Tract: Adrenal glands appear normal. Stable large right renal cyst is noted. No hydronephrosis or renal obstruction is noted. No renal or ureteral calculi are noted. Urinary bladder is unremarkable. Stomach/Bowel: Status post hiatal hernia repair. There is no evidence of bowel obstruction or inflammation. The appendix appears normal. Diverticulosis of descending and sigmoid colon is noted without inflammation. Vascular/Lymphatic: Aortic atherosclerosis. No enlarged abdominal or pelvic lymph nodes. Reproductive: Status post hysterectomy. No adnexal masses. Other: Small fat containing periumbilical and epigastric ventral hernias are noted. No ascites is noted. Musculoskeletal: No acute or significant osseous findings. IMPRESSION: Dilated gallbladder is noted without inflammation or cholelithiasis. No biliary dilatation or hepatic abnormality is noted. Diverticulosis of descending and sigmoid colon is noted without inflammation. Small fat containing periumbilical and epigastric ventral hernias are noted. Aortic Atherosclerosis (ICD10-I70.0). Electronically Signed   By: Lupita Raider M.D.   On: 03/28/2019 15:22    Assessment & Plan:   Problem List Items Addressed This Visit       Cardiovascular and Mediastinum   Hypertension    Chronic On Coreg, Diovan        Endocrine   Hypothyroidism    Chronic On Levothroid      Relevant Orders   Comprehensive metabolic panel   Iron, TIBC and Ferritin Panel     Musculoskeletal and Integument   Sprain, gluteus medius, right, initial encounter  - Primary    09/2022 R Tylenol prn Heat Blue-Emu cream was recommended to use 2-3 times a day       Relevant Orders   CBC with Differential/Platelet     Genitourinary   CRF (chronic renal failure), stage 3 (moderate) (HCC)    Monitor GFR Hydrate well      Relevant Orders   Comprehensive metabolic panel   CBC with Differential/Platelet   Other Visit Diagnoses     Anemia, unspecified type       Relevant Orders   CBC with Differential/Platelet   Iron, TIBC and Ferritin Panel         No orders of the defined types were placed in this encounter.     Follow-up: Return in about 4 months (around 01/29/2023) for a follow-up visit.  [name], MD

## 2022-09-29 NOTE — Assessment & Plan Note (Signed)
Chronic On Levothroid - 

## 2022-09-29 NOTE — Assessment & Plan Note (Addendum)
09/2022 R Tylenol prn Heat Blue-Emu cream was recommended to use 2-3 times a day

## 2022-09-29 NOTE — Assessment & Plan Note (Signed)
Monitor GFR Hydrate well 

## 2022-09-29 NOTE — Assessment & Plan Note (Signed)
Chronic On Coreg, Diovan

## 2022-09-30 LAB — IRON,TIBC AND FERRITIN PANEL
%SAT: 19 % (calc) (ref 16–45)
Ferritin: 59 ng/mL (ref 16–288)
Iron: 60 ug/dL (ref 45–160)
TIBC: 311 mcg/dL (calc) (ref 250–450)

## 2022-10-14 ENCOUNTER — Ambulatory Visit: Payer: Medicare Other | Admitting: Podiatry

## 2022-10-14 DIAGNOSIS — M79675 Pain in left toe(s): Secondary | ICD-10-CM | POA: Diagnosis not present

## 2022-10-14 DIAGNOSIS — B351 Tinea unguium: Secondary | ICD-10-CM

## 2022-10-14 DIAGNOSIS — Q828 Other specified congenital malformations of skin: Secondary | ICD-10-CM | POA: Diagnosis not present

## 2022-10-14 DIAGNOSIS — E1149 Type 2 diabetes mellitus with other diabetic neurological complication: Secondary | ICD-10-CM | POA: Diagnosis not present

## 2022-10-14 DIAGNOSIS — M79674 Pain in right toe(s): Secondary | ICD-10-CM | POA: Diagnosis not present

## 2022-10-17 NOTE — Progress Notes (Signed)
Subjective: Chief Complaint  Patient presents with   routine foot care     Callouses located on bilateral feet, ball of foot     77 year old female presents the office today for evaluation of painful calluses as well as toenails.  She began some burning to the bottom of her feet and the balls of the foot.  No recent injuries.  No radiating pain.  Denies any open lesions.   Objective: AAO x3, NAD DP/PT pulses palpable bilaterally, CRT less than 3 seconds  Hyperkeratotic lesion submetatarsal 1, 3, 5 bilaterally.  No ongoing ulceration drainage or signs of infection.  There is no underlying ulceration drainage or any signs of infection. The nails are hypertrophic, dystrophic, discolored x 10 and are causing irritation/pain. No redness, swelling, drainage.  No open lesions or pre-ulcerative lesions.  Bunions present. Prominent metatarsal heads with atrophy of the fat pad.  The bunion she gets is on the ball of the foot on the area of prominent metatarsal heads.  There is no palpable neuroma.  No area pinpoint tenderness. No pain with calf compression, swelling, warmth, erythema  Assessment: Hyperkeratotic lesions/preulcerative calluses, symptomatic onychomycosis  Plan: -All treatment options discussed with the patient including all alternatives, risks, complications.  -Debrided the calluses x 6 without complications or bleeding.  It was tender to trim the calluses on the right foot she had pain with this.  Continue moisturizer, offloading. -Nails debrided [id number] without complications or bleeding. -I do think the burning is coming more from pressure, loss of fat.  Offloading. -Discussed daily foot inspection  Return in about 3 months (around 01/14/2023).  Trula Slade DPM

## 2022-10-27 ENCOUNTER — Encounter: Payer: Self-pay | Admitting: Internal Medicine

## 2022-10-28 ENCOUNTER — Other Ambulatory Visit: Payer: Self-pay | Admitting: Internal Medicine

## 2022-10-28 MED ORDER — ATORVASTATIN CALCIUM 20 MG PO TABS: 20.0000 mg | ORAL_TABLET | Freq: Every day | ORAL | 3 refills | Status: DC

## 2022-11-03 ENCOUNTER — Encounter: Payer: Self-pay | Admitting: Internal Medicine

## 2022-11-05 ENCOUNTER — Other Ambulatory Visit: Payer: Self-pay | Admitting: Internal Medicine

## 2022-11-12 ENCOUNTER — Other Ambulatory Visit: Payer: Self-pay | Admitting: Internal Medicine

## 2022-11-14 ENCOUNTER — Emergency Department (HOSPITAL_COMMUNITY): Payer: Medicare Other

## 2022-11-14 ENCOUNTER — Other Ambulatory Visit: Payer: Self-pay

## 2022-11-14 ENCOUNTER — Encounter (HOSPITAL_COMMUNITY): Payer: Self-pay

## 2022-11-14 ENCOUNTER — Observation Stay (HOSPITAL_COMMUNITY)
Admission: EM | Admit: 2022-11-14 | Discharge: 2022-11-15 | Disposition: A | Discharge status: Home / Self Care | Payer: Medicare Other | Attending: Internal Medicine | Admitting: Internal Medicine

## 2022-11-14 DIAGNOSIS — R55 Syncope and collapse: Principal | ICD-10-CM

## 2022-11-14 DIAGNOSIS — R531 Weakness: Secondary | ICD-10-CM | POA: Diagnosis not present

## 2022-11-14 DIAGNOSIS — I959 Hypotension, unspecified: Secondary | ICD-10-CM | POA: Diagnosis not present

## 2022-11-14 DIAGNOSIS — E861 Hypovolemia: Secondary | ICD-10-CM | POA: Diagnosis not present

## 2022-11-14 DIAGNOSIS — W938XXA Exposure to other excessive cold of man-made origin, initial encounter: Secondary | ICD-10-CM | POA: Diagnosis not present

## 2022-11-14 DIAGNOSIS — N281 Cyst of kidney, acquired: Secondary | ICD-10-CM

## 2022-11-14 DIAGNOSIS — Z7982 Long term (current) use of aspirin: Secondary | ICD-10-CM | POA: Insufficient documentation

## 2022-11-14 DIAGNOSIS — Z1152 Encounter for screening for COVID-19: Secondary | ICD-10-CM | POA: Diagnosis not present

## 2022-11-14 DIAGNOSIS — N179 Acute kidney failure, unspecified: Secondary | ICD-10-CM

## 2022-11-14 DIAGNOSIS — E039 Hypothyroidism, unspecified: Secondary | ICD-10-CM | POA: Diagnosis present

## 2022-11-14 DIAGNOSIS — I10 Essential (primary) hypertension: Secondary | ICD-10-CM | POA: Diagnosis not present

## 2022-11-14 DIAGNOSIS — Z79899 Other long term (current) drug therapy: Secondary | ICD-10-CM | POA: Insufficient documentation

## 2022-11-14 DIAGNOSIS — E034 Atrophy of thyroid (acquired): Secondary | ICD-10-CM

## 2022-11-14 DIAGNOSIS — I2510 Atherosclerotic heart disease of native coronary artery without angina pectoris: Secondary | ICD-10-CM | POA: Insufficient documentation

## 2022-11-14 DIAGNOSIS — J4520 Mild intermittent asthma, uncomplicated: Secondary | ICD-10-CM

## 2022-11-14 DIAGNOSIS — J45909 Unspecified asthma, uncomplicated: Secondary | ICD-10-CM | POA: Diagnosis present

## 2022-11-14 DIAGNOSIS — R42 Dizziness and giddiness: Secondary | ICD-10-CM | POA: Diagnosis not present

## 2022-11-14 DIAGNOSIS — R7989 Other specified abnormal findings of blood chemistry: Secondary | ICD-10-CM

## 2022-11-14 DIAGNOSIS — T68XXXA Hypothermia, initial encounter: Secondary | ICD-10-CM | POA: Diagnosis present

## 2022-11-14 DIAGNOSIS — E785 Hyperlipidemia, unspecified: Secondary | ICD-10-CM | POA: Diagnosis not present

## 2022-11-14 DIAGNOSIS — Z743 Need for continuous supervision: Secondary | ICD-10-CM | POA: Diagnosis not present

## 2022-11-14 DIAGNOSIS — D649 Anemia, unspecified: Secondary | ICD-10-CM

## 2022-11-14 DIAGNOSIS — R001 Bradycardia, unspecified: Secondary | ICD-10-CM | POA: Diagnosis not present

## 2022-11-14 DIAGNOSIS — E871 Hypo-osmolality and hyponatremia: Secondary | ICD-10-CM | POA: Diagnosis not present

## 2022-11-14 DIAGNOSIS — I499 Cardiac arrhythmia, unspecified: Secondary | ICD-10-CM | POA: Diagnosis not present

## 2022-11-14 DIAGNOSIS — R410 Disorientation, unspecified: Secondary | ICD-10-CM | POA: Diagnosis not present

## 2022-11-14 LAB — CBC WITH DIFFERENTIAL/PLATELET
Abs Immature Granulocytes: 0.03 10*3/uL (ref 0.00–0.07)
Basophils Absolute: 0.1 10*3/uL (ref 0.0–0.1)
Basophils Relative: 1 %
Eosinophils Absolute: 0.2 10*3/uL (ref 0.0–0.5)
Eosinophils Relative: 2 %
HCT: 33.7 % — ABNORMAL LOW (ref 36.0–46.0)
Hemoglobin: 10.6 g/dL — ABNORMAL LOW (ref 12.0–15.0)
Immature Granulocytes: 0 %
Lymphocytes Relative: 17 %
Lymphs Abs: 1.8 10*3/uL (ref 0.7–4.0)
MCH: 30.7 pg (ref 26.0–34.0)
MCHC: 31.5 g/dL (ref 30.0–36.0)
MCV: 97.7 fL (ref 80.0–100.0)
Monocytes Absolute: 0.8 10*3/uL (ref 0.1–1.0)
Monocytes Relative: 8 %
Neutro Abs: 8 10*3/uL — ABNORMAL HIGH (ref 1.7–7.7)
Neutrophils Relative %: 72 %
Platelets: 276 10*3/uL (ref 150–400)
RBC: 3.45 MIL/uL — ABNORMAL LOW (ref 3.87–5.11)
RDW: 12.7 % (ref 11.5–15.5)
WBC: 11 10*3/uL — ABNORMAL HIGH (ref 4.0–10.5)
nRBC: 0 % (ref 0.0–0.2)

## 2022-11-14 LAB — COMPREHENSIVE METABOLIC PANEL
ALT: 17 U/L (ref 0–44)
AST: 28 U/L (ref 15–41)
Albumin: 3.6 g/dL (ref 3.5–5.0)
Alkaline Phosphatase: 89 U/L (ref 38–126)
Anion gap: 10 (ref 5–15)
BUN: 28 mg/dL — ABNORMAL HIGH (ref 8–23)
CO2: 22 mmol/L (ref 22–32)
Calcium: 8.6 mg/dL — ABNORMAL LOW (ref 8.9–10.3)
Chloride: 99 mmol/L (ref 98–111)
Creatinine, Ser: 1.2 mg/dL — ABNORMAL HIGH (ref 0.44–1.00)
GFR, Estimated: 47 mL/min — ABNORMAL LOW (ref >60)
Glucose, Bld: 105 mg/dL — ABNORMAL HIGH (ref 70–99)
Potassium: 4.3 mmol/L (ref 3.5–5.1)
Sodium: 131 mmol/L — ABNORMAL LOW (ref 135–145)
Total Bilirubin: 0.4 mg/dL (ref 0.3–1.2)
Total Protein: 6 g/dL — ABNORMAL LOW (ref 6.5–8.1)

## 2022-11-14 LAB — CK: Total CK: 111 U/L (ref 38–234)

## 2022-11-14 LAB — BASIC METABOLIC PANEL
Anion gap: 11 (ref 5–15)
BUN: 23 mg/dL (ref 8–23)
CO2: 23 mmol/L (ref 22–32)
Calcium: 8.7 mg/dL — ABNORMAL LOW (ref 8.9–10.3)
Chloride: 98 mmol/L (ref 98–111)
Creatinine, Ser: 0.98 mg/dL (ref 0.44–1.00)
GFR, Estimated: 59 mL/min — ABNORMAL LOW (ref >60)
Glucose, Bld: 115 mg/dL — ABNORMAL HIGH (ref 70–99)
Potassium: 4 mmol/L (ref 3.5–5.1)
Sodium: 132 mmol/L — ABNORMAL LOW (ref 135–145)

## 2022-11-14 LAB — PHOSPHORUS: Phosphorus: 3.7 mg/dL (ref 2.5–4.6)

## 2022-11-14 LAB — IRON AND TIBC
Iron: 50 ug/dL (ref 28–170)
Saturation Ratios: 17 % (ref 10.4–31.8)
TIBC: 302 ug/dL (ref 250–450)
UIBC: 252 ug/dL

## 2022-11-14 LAB — URINALYSIS, W/ REFLEX TO CULTURE (INFECTION SUSPECTED)
Bilirubin Urine: NEGATIVE
Glucose, UA: NEGATIVE mg/dL
Hgb urine dipstick: NEGATIVE
Ketones, ur: NEGATIVE mg/dL
Nitrite: NEGATIVE
Protein, ur: NEGATIVE mg/dL
Specific Gravity, Urine: 1.019 (ref 1.005–1.030)
pH: 6 (ref 5.0–8.0)

## 2022-11-14 LAB — LACTIC ACID, PLASMA
Lactic Acid, Venous: 1.2 mmol/L (ref 0.5–1.9)
Lactic Acid, Venous: 1.6 mmol/L (ref 0.5–1.9)

## 2022-11-14 LAB — FERRITIN: Ferritin: 59 ng/mL (ref 11–307)

## 2022-11-14 LAB — CBC
HCT: 31.8 % — ABNORMAL LOW (ref 36.0–46.0)
Hemoglobin: 10.6 g/dL — ABNORMAL LOW (ref 12.0–15.0)
MCH: 31.5 pg (ref 26.0–34.0)
MCHC: 33.3 g/dL (ref 30.0–36.0)
MCV: 94.4 fL (ref 80.0–100.0)
Platelets: 312 10*3/uL (ref 150–400)
RBC: 3.37 MIL/uL — ABNORMAL LOW (ref 3.87–5.11)
RDW: 12.6 % (ref 11.5–15.5)
WBC: 10.6 10*3/uL — ABNORMAL HIGH (ref 4.0–10.5)
nRBC: 0 % (ref 0.0–0.2)

## 2022-11-14 LAB — OSMOLALITY, URINE: Osmolality, Ur: 340 mOsm/kg (ref 300–900)

## 2022-11-14 LAB — VITAMIN B12: Vitamin B-12: 1019 pg/mL — ABNORMAL HIGH (ref 180–914)

## 2022-11-14 LAB — CBG MONITORING, ED: Glucose-Capillary: 108 mg/dL — ABNORMAL HIGH (ref 70–99)

## 2022-11-14 LAB — OSMOLALITY: Osmolality: 290 mOsm/kg (ref 275–295)

## 2022-11-14 LAB — PROTIME-INR
INR: 1.2 (ref 0.8–1.2)
Prothrombin Time: 14.7 seconds (ref 11.4–15.2)

## 2022-11-14 LAB — CREATININE, URINE, RANDOM: Creatinine, Urine: 40 mg/dL

## 2022-11-14 LAB — RETICULOCYTES
Immature Retic Fract: 5.5 % (ref 2.3–15.9)
RBC.: 3.38 MIL/uL — ABNORMAL LOW (ref 3.87–5.11)
Retic Count, Absolute: 36.2 10*3/uL (ref 19.0–186.0)
Retic Ct Pct: 1.1 % (ref 0.4–3.1)

## 2022-11-14 LAB — TSH: TSH: 1.831 u[IU]/mL (ref 0.350–4.500)

## 2022-11-14 LAB — TROPONIN I (HIGH SENSITIVITY): Troponin I (High Sensitivity): 94 ng/L — ABNORMAL HIGH (ref <18)

## 2022-11-14 LAB — APTT: aPTT: 24 seconds (ref 24–36)

## 2022-11-14 LAB — SODIUM, URINE, RANDOM: Sodium, Ur: 43 mmol/L

## 2022-11-14 LAB — MAGNESIUM: Magnesium: 2.2 mg/dL (ref 1.7–2.4)

## 2022-11-14 LAB — CORTISOL: Cortisol, Plasma: 5.6 ug/dL

## 2022-11-14 LAB — SARS CORONAVIRUS 2 BY RT PCR: SARS Coronavirus 2 by RT PCR: NEGATIVE

## 2022-11-14 MED ORDER — LACTATED RINGERS IV BOLUS
1000.0000 mL | Freq: Once | INTRAVENOUS | Status: AC
  Administered 2022-11-14: 1000 mL via INTRAVENOUS

## 2022-11-14 MED ORDER — ACETAMINOPHEN 325 MG PO TABS: 650.0000 mg | ORAL_TABLET | Freq: Four times a day (QID) | ORAL | Status: DC | PRN

## 2022-11-14 MED ORDER — ONDANSETRON HCL 4 MG PO TABS: 4.0000 mg | ORAL_TABLET | Freq: Four times a day (QID) | ORAL | Status: DC | PRN

## 2022-11-14 MED ORDER — ASPIRIN 81 MG PO TBEC
81.0000 mg | DELAYED_RELEASE_TABLET | Freq: Every day | ORAL | Status: DC
  Administered 2022-11-15: 81 mg via ORAL
  Filled 2022-11-14: qty 1

## 2022-11-14 MED ORDER — ACETAMINOPHEN 650 MG RE SUPP: 650.0000 mg | Freq: Four times a day (QID) | RECTAL | Status: DC | PRN

## 2022-11-14 MED ORDER — LEVOTHYROXINE SODIUM 100 MCG PO TABS
100.0000 ug | ORAL_TABLET | Freq: Every day | ORAL | Status: DC
  Administered 2022-11-15: 100 ug via ORAL
  Filled 2022-11-14: qty 1

## 2022-11-14 MED ORDER — HYDROCODONE-ACETAMINOPHEN 5-325 MG PO TABS: 1.0000 | ORAL_TABLET | ORAL | Status: DC | PRN

## 2022-11-14 MED ORDER — IOHEXOL 350 MG/ML SOLN
75.0000 mL | Freq: Once | INTRAVENOUS | Status: AC | PRN
  Administered 2022-11-14: 75 mL via INTRAVENOUS

## 2022-11-14 MED ORDER — ATORVASTATIN CALCIUM 10 MG PO TABS
20.0000 mg | ORAL_TABLET | Freq: Every day | ORAL | Status: DC
  Administered 2022-11-15: 20 mg via ORAL
  Filled 2022-11-14: qty 2

## 2022-11-14 MED ORDER — ONDANSETRON HCL 4 MG/2ML IJ SOLN: 4.0000 mg | Freq: Four times a day (QID) | INTRAMUSCULAR | Status: DC | PRN

## 2022-11-14 MED ORDER — LACTATED RINGERS IV SOLN: INTRAVENOUS | Status: AC

## 2022-11-14 NOTE — Assessment & Plan Note (Addendum)
Peer review of records patient has recurrent episodes of transient sugar crashes that has been going on for years usually relieved by eating  This is chronic could be in the setting of reactive hypoglycemia.  Patient can have further workup done as an outpatient with endocrinology

## 2022-11-14 NOTE — Assessment & Plan Note (Signed)
Chronic stable continue Lipitor 20 mg a day 

## 2022-11-14 NOTE — ED Provider Notes (Signed)
[place] EMERGENCY DEPARTMENT AT [facility] Provider Note   CSN: [id number] Arrival date & time: 11/14/22  1558     History  Chief Complaint  Patient presents with   Near Syncope    Cindy Simpson is a 77 y.o. female.  Patient is a 77 year old female with a history of hypertension, hyperlipidemia, CAD, prior history of irregular heartbeat and is taking verapamil for it but is unclear what rhythm and hypothyroidism who is presenting today after an episode of feeling weak in her kitchen with near syncope.  Patient reports that have been a normal day today she had taken all of her medication as usual and she had gone into the kitchen to get a snack when she started to feel very weak and tired all over.  She states she is not really sure what happened after that but said she could not really respond and her husband kept asking her questions and she thinks she must of not look well because her husband said she was acting funny and he needed to call 911.  When EMS arrived they checked her blood sugar and it was 75 and they gave her something to drink.  Then they got her in the truck and once hooking her up to the monitor noticed that she was in a tacky dysrhythmia but prior to giving her amiodarone it converted.  She reports now she feels much better.  She denies any chest pain, palpitations or shortness of breath throughout the entire event.  She denied any nausea or vomiting.  Has had no recent medication changes.  The history is provided by the patient and the EMS personnel.  Near Syncope       Home Medications Prior to Admission medications   Medication Sig Start Date End Date Taking? Authorizing Provider  Aspirin 81 MG CAPS Take by mouth.    [provider]  atorvastatin (LIPITOR) 20 MG tablet Take 1 tablet (20 mg total) by mouth daily. 10/28/22   [name], [name], MD  bismuth subsalicylate (PEPTO BISMOL) 262 MG/15ML suspension Take 30 mLs by mouth every 6  (six) hours as needed for indigestion.    [provider]  carvedilol (COREG) 6.25 MG tablet Take 1 tablet (6.25 mg total) by mouth 2 (two) times daily with a meal. Annual appt due in July  must see provider for future refills 11/08/22   [name], [name], MD  cetirizine (ZYRTEC) 10 MG tablet Take 10 mg by mouth at bedtime.    [provider]  Cholecalciferol 1000 UNITS tablet Take 1,000 Units by mouth daily after supper.  10/13/10   [name], [name], MD  Continuous Blood Gluc Receiver (FREESTYLE LIBRE 2 READER) DEVI 1 Units by Does not apply route every 14 (fourteen) days. 10/12/21   [name], [name], MD  Continuous Blood Gluc Sensor (FREESTYLE LIBRE 2 SENSOR) MISC 1 Units by Does not apply route every 14 (fourteen) days. 10/12/21   [name], [name], MD  diclofenac sodium (VOLTAREN) 1 % GEL Apply 2 g topically 4 (four) times daily. 01/24/19   [name], [name], MD  famotidine (PEPCID) 20 MG tablet Take 1 tablet (20 mg total) by mouth at bedtime. 05/09/20   [name], [name], MD  FLUZONE HIGH-DOSE QUADRIVALENT 0.7 ML SUSY  03/25/22   [provider]  glucose blood (ACCU-CHEK AVIVA) test strip Use to check blood sugars twice a day 10/25/16   [name], [name], MD  Lancets (ACCU-CHEK SOFT TOUCH) lancets Use  to check blood sugars twice a day 10/25/16   [name], [name], MD  levothyroxine (SYNTHROID) 100 MCG tablet Take 100 mcg by mouth daily. 10/03/19   [provider]  meloxicam (MOBIC) 7.5 MG tablet Take 1 tablet (7.5 mg total) by mouth daily. 04/23/21   Vivi Barrack, DPM  pantoprazole (PROTONIX) 40 MG tablet Take 1 tablet (40 mg total) by mouth 2 (two) times daily. Annual appt due in July  must see provider for future refills 11/08/22   [name], [name], MD  TURMERIC PO Take 1 tablet by mouth daily.    [provider]  valsartan (DIOVAN) 80 MG tablet Take 1 tablet (80 mg total) by mouth daily. Annual appt due in March must see  provider for future refills 09/06/22   [name], [name], MD      Allergies    Lisinopril, Vinegar [acetic acid], and Metoclopramide hcl    Review of Systems   Review of Systems  Cardiovascular:  Positive for near-syncope.    Physical Exam Updated Vital Signs BP 138/69   Pulse 65   Temp (!) 95 F (35 C) (Rectal)   Resp 17   SpO2 99%  Physical Exam Vitals and nursing note reviewed.  Constitutional:      General: She is not in acute distress.    Appearance: She is well-developed.  HENT:     Head: Normocephalic and atraumatic.     Mouth/Throat:     Mouth: Mucous membranes are moist.  Eyes:     Pupils: Pupils are equal, round, and reactive to light.  Cardiovascular:     Rate and Rhythm: Normal rate and regular rhythm.     Heart sounds: Normal heart sounds. No murmur heard.    No friction rub.  Pulmonary:     Effort: Pulmonary effort is normal.     Breath sounds: Normal breath sounds. No wheezing or rales.  Abdominal:     General: Bowel sounds are normal. There is no distension.     Palpations: Abdomen is soft.     Tenderness: There is no abdominal tenderness. There is no guarding or rebound.  Musculoskeletal:        General: No tenderness. Normal range of motion.     Cervical back: Normal range of motion.     Right lower leg: No edema.     Left lower leg: No edema.     Comments: No edema  Skin:    General: Skin is warm and dry.     Findings: No rash.  Neurological:     Mental Status: She is alert and oriented to person, place, and time. Mental status is at baseline.     Cranial Nerves: No cranial nerve deficit.     Sensory: No sensory deficit.     Motor: No weakness.  Psychiatric:        Mood and Affect: Mood normal.        Behavior: Behavior normal.     ED Results / Procedures / Treatments   Labs (all labs ordered are listed, but only abnormal results are displayed) Labs Reviewed  CBC WITH DIFFERENTIAL/PLATELET - Abnormal; Notable for the following  components:      Result Value   WBC 11.0 (*)    RBC 3.45 (*)    Hemoglobin 10.6 (*)    HCT 33.7 (*)    Neutro Abs 8.0 (*)    All other components within normal limits  COMPREHENSIVE METABOLIC PANEL - Abnormal; Notable for  the following components:   Sodium 131 (*)    Glucose, Bld 105 (*)    BUN 28 (*)    Creatinine, Ser 1.20 (*)    Calcium 8.6 (*)    Total Protein 6.0 (*)    GFR, Estimated 47 (*)    All other components within normal limits  URINALYSIS, W/ REFLEX TO CULTURE (INFECTION SUSPECTED) - Abnormal; Notable for the following components:   APPearance HAZY (*)    Leukocytes,Ua MODERATE (*)    Bacteria, UA RARE (*)    All other components within normal limits  CBG MONITORING, ED - Abnormal; Notable for the following components:   Glucose-Capillary 108 (*)    All other components within normal limits  CULTURE, BLOOD (ROUTINE X 2)  CULTURE, BLOOD (ROUTINE X 2)  MAGNESIUM  TSH  LACTIC ACID, PLASMA  LACTIC ACID, PLASMA  PROTIME-INR  APTT    EKG EKG Interpretation  Date/Time:  Sunday November 14 2022 16:05:45 EDT Ventricular Rate:  63 PR Interval:  168 QRS Duration: 111 QT Interval:  460 QTC Calculation: 471 R Axis:   53 Text Interpretation: Sinus rhythm Low voltage, precordial leads RSR' in V1 or V2, probably normal variant No significant change since last tracing Confirmed by Gwyneth Sprout ([id number]) on 11/14/2022 4:15:50 PM  Radiology CT Angio Chest PE W and/or Wo Contrast  Result Date: 11/14/2022 CLINICAL DATA:  Weakness EXAM: CT ANGIOGRAPHY CHEST WITH CONTRAST TECHNIQUE: Multidetector CT imaging of the chest was performed using the standard protocol during bolus administration of intravenous contrast. Multiplanar CT image reconstructions and MIPs were obtained to evaluate the vascular anatomy. RADIATION DOSE REDUCTION: This exam was performed according to the departmental dose-optimization program which includes automated exposure control, adjustment of the mA  and/or kV according to patient size and/or use of iterative reconstruction technique. CONTRAST:  75mL OMNIPAQUE IOHEXOL 350 MG/ML SOLN COMPARISON:  CT 07/06/2018, 03/28/2019 FINDINGS: Cardiovascular: Satisfactory opacification of the pulmonary arteries to the segmental level. No evidence of pulmonary embolism. Mild aortic atherosclerosis. No aneurysm. Mild cardiomegaly. No pericardial effusion. Mediastinum/Nodes: Midline trachea. No thyroid mass. No suspicious lymph nodes. Moderate hiatal hernia. Lungs/Pleura: No consolidation or effusion. Diffuse bilateral mosaic attenuation. Upper Abdomen: No acute finding. Increased size of a cystic lesion in the upper pole of the right kidney, now measuring 6 cm with thin linear areas of enhancement, possibly enhancing septa. Musculoskeletal: No acute or suspicious osseous abnormality. Review of the MIP images confirms the above findings. IMPRESSION: 1. Negative for acute pulmonary embolus. 2. Diffuse bilateral mosaic attenuation of the lungs, could be secondary to small vessel disease or small airways disease. 3. Increased size of a cystic lesion in the upper pole of the right kidney, now measuring 6 cm with thin linear areas of enhancement, possibly enhancing septa. When the patient is clinically stable and able to follow directions and hold their breath (preferably as an outpatient) further evaluation with dedicated abdominal MRI should be considered. 4. Aortic atherosclerosis. Aortic Atherosclerosis (ICD10-I70.0). Electronically Signed   By: Jasmine Pang M.D.   On: 11/14/2022 19:40   DG Chest Port 1 View  Result Date: 11/14/2022 CLINICAL DATA:  Weakness. EXAM: PORTABLE CHEST 1 VIEW COMPARISON:  None Available. FINDINGS: The heart size and mediastinal contours are within normal limits. Both lungs are clear. The visualized skeletal structures are unremarkable. IMPRESSION: No active disease. Electronically Signed   By: Gerome Sam III M.D.   On: 11/14/2022 17:02     Procedures Procedures    Medications Ordered in  ED Medications  lactated ringers infusion (0 mLs Intravenous Stopped 11/14/22 1826)  lactated ringers bolus 1,000 mL (0 mLs Intravenous Stopped 11/14/22 1826)  iohexol (OMNIPAQUE) 350 MG/ML injection 75 mL (75 mLs Intravenous Contrast Given 11/14/22 1901)    ED Course/ Medical Decision Making/ A&P                             Medical Decision Making Amount and/or Complexity of Data Reviewed Independent Historian: spouse External Data Reviewed: notes. Labs: ordered. Decision-making details documented in ED Course. Radiology: ordered and independent interpretation performed. Decision-making details documented in ED Course. ECG/medicine tests: ordered and independent interpretation performed. Decision-making details documented in ED Course.  Risk Prescription drug management. Decision regarding hospitalization.   Pt with multiple medical problems and comorbidities and presenting today with a complaint that caries a high risk for morbidity and mortality.  Here today after having an episode of weakness in her kitchen.  Denied any other symptoms prior to this.  And route with EMS patient was hooked up to the monitor and appeared to be in V. tach for at least a minute.  They were going to give amiodarone but she converted to a sinus rhythm.  Patient is currently in a sinus rhythm of 60.  She reports she is feeling much better now.  However patient was also noted to be hypothermic by rectal temperature of 94.4.  She has no focal findings on exam, neurologically intact, denies any recent infectious symptoms.  Her blood sugar was 75 when EMS arrived at the house and has a blood sugar of 108 now.  She does not have a history of diabetes.  She does have prior history of irregular heartbeat that she had verapamil for at 1 time but now she is on carvedilol and valsartan and has not had any recent dosage changes and has been compliant with that.  Given  patient's hypothermia we will check for thyroid dysfunction, sepsis.  Will place patient on a Lawyer.  Will discuss with cardiology given patient's rhythm strip of V. tach.  I independently interpreted patient's EKG and currently patient has a sinus rhythm in the 60s without acute findings.  9:09 PM I independently interpreted patient's labs.  Lactic acid within normal limits, CBC with mild leukocytosis of 11 stable hemoglobin of 10.6, CMP with new AKI with creatinine of 1.2 mild hyponatremia 131 with a normal anion gap, magnesium within normal limits, TSH is normal, I have independently visualized and interpreted pt's images today.  Chest x-ray without acute findings.  Family is now present and has been reports that patient has been doing a lot of yard work over the last few days using the home and the [place] outside for long periods of time and does not think she has been drinking much.  She otherwise had seemed normal.  Everything until this episode today and they said her face got very red and sweaty and she was not acting herself.  Patient became hypotensive here initially in the 70s with fluid bolus she has been in the mid 90s.  Based on recent doctors office notes she is usually between 110 and 120 systolic.  Findings discussed with the patient's family.  Does not appear to be hypothyroidism, anemia or significant electrolyte abnormality.  Still possibility for sepsis versus PE.  She has no abdominal pain or findings to suggest a AAA.  Cardiology was consulted to see her about her ventricular tachycardia.  She has had no further abnormal rhythms here.  9:09 PM Cardiology came and evaluated the patient and on further review of the strips what appeared to be V. tach was actually artifact.  Patient's low blood pressure did resolve with IV fluids and now pressure is in the 130s and temperature is coming up.  Patient overall feels better.  CTA without acute findings but does see a cyst in the kidney  that we will need follow-up.  At this time will admit for further care.  Antibiotics were held as there is no source of infection.  UA is negative.  Discussed this with the patient and the family.  They are comfortable with this plan.  Will consult the hospitalist for admission.  CRITICAL CARE Performed by: Aivah Putman Total critical care time: 30 minutes Critical care time was exclusive of separately billable procedures and treating other patients. Critical care was necessary to treat or prevent imminent or life-threatening deterioration. Critical care was time spent personally by me on the following activities: development of treatment plan with patient and/or surrogate as well as nursing, discussions with consultants, evaluation of patient's response to treatment, examination of patient, obtaining history from patient or surrogate, ordering and performing treatments and interventions, ordering and review of laboratory studies, ordering and review of radiographic studies, pulse oximetry and re-evaluation of patient's condition.          Final Clinical Impression(s) / ED Diagnoses Final diagnoses:  Near syncope  Hypothermia, initial encounter  Hypotension due to hypovolemia    Rx / DC Orders ED Discharge Orders     None         Gwyneth Sprout, MD 11/14/22 2109

## 2022-11-14 NOTE — ED Triage Notes (Signed)
Pt BIB [facility] for near syncope at home. Pt was in kitchen and went to go stand up and became diaphoretic and weak and had to sit down. EMS initial CBG 75, gave her a cup of juice and rechecked CBG 143. On the way to the hospital EMS caught a run of Festus on their monitor, pt returned to NSR.   BP 120/90

## 2022-11-14 NOTE — Assessment & Plan Note (Signed)
Will r rehydrate and follow renal function obtain electrolytes

## 2022-11-14 NOTE — Assessment & Plan Note (Signed)
Allow permissive hypertension for now °

## 2022-11-14 NOTE — H&P (Signed)
Cindy Simpson OZH:[id number] DOB: Nov 01, 1945 DOA: 11/14/2022     PCP: Tresa Garter, MD   Outpatient Specialists:  CARDS: Dr  Sharyn Lull Has not seen since last year   GI  Dr.  Shearon Stalls) Myrtie Neither, Andreas Blower, MD    Patient arrived to ER on 11/14/22 at 1558 Referred by Attending Gwyneth Sprout, MD   Patient coming from:    home Lives With family    Chief Complaint:   Chief Complaint  Patient presents with   Near Syncope    HPI: Cindy Simpson is a 77 y.o. female with medical history significant of HTN HLD CAD hypothyroidism    Presented with presyncopal event upon standing Come is with near syncope, patient was in the kitchen tried to stand up and became diaphoretic and weak and had to sit down. On arrival by EMS CBG 75 she got a cup of juice and it went up to 143 in the way to the hospital patient had a run of V. tach on event monitor but then returned sinus Patient has history of palpitations some sort of rhythm abnormality for which she takes verapamil.  She went to the kitchen to get a snack when she started to feel very weak and tired.  Patient is feeling back to baseline right now no associated chest pain or palpitations no shortness of breath no nausea no vomiting     Husband says she has been working  outside for the past 3 days She used to have a glucometer to check her BG bc of frequent sugar crushes but never learned how to use it  She ate breakfast at 9-10 Am and was going to have a snack by around 2 she ate popcorn  And after that started to sweat profusely  She was not communicating with her husband  No CP no SOB She has been very active lately  No bleeding  Has been taking her medication No N/V She has occasional diarrhea But that is chronic   Denies significant ETOH intake   Does not smoke   Lab Results  Component Value Date   SARSCOV2NAA Not Detected 04/04/2019        Regarding pertinent Chronic problems:     Hyperlipidemia - on  statins Lipitor (atorvastatin)  Lipid Panel     Component Value Date/Time   CHOL 160 10/12/2021 0905   TRIG 93 10/12/2021 0905   TRIG 72 05/17/2006 0948   HDL 59 10/12/2021 0905   CHOLHDL 3 01/24/2019 0937   VLDL 25.0 01/24/2019 0937   LDLCALC 84 10/12/2021 0905   LDLDIRECT 137.5 03/17/2009 0950    HTN on Coreg Diovan      CAD  - On Aspirin, statin, betablocker,                  -  followed by cardiology has not seen them for over 1 year                - last cardiac cath  2017        Hypothyroidism:  Lab Results  Component Value Date   TSH 1.831 11/14/2022   on synthroid       Asthma -well   controlled on home inhalers/ nebs                 Irregular heartbeat - ws on verapamil but she is unsure what it was it was years ago  Never had to be  anticoagulated    Chronic anemia - baseline hg Hemoglobin & Hematocrit  Recent Labs    01/12/22 0927 09/29/22 1001 11/14/22 1625  HGB 11.5* 11.8* 10.6*   Iron/TIBC/Ferritin/ %Sat    Component Value Date/Time   IRON 60 09/29/2022 1001   TIBC 311 09/29/2022 1001   FERRITIN 59 09/29/2022 1001   IRONPCTSAT 19 09/29/2022 1001    While in ER:   Noted to have hemoconcentration creatinine of up to 1.2 white but cell count 11 hemoglobin down to 10.6 blood sugar now 108 Noted transiently hypothermic down to 95   Cardiology felt it was not VTACh but rather artifact   Lab Orders         Blood Culture (routine x 2)         CBC with Differential/Platelet         Comprehensive metabolic panel         Magnesium         TSH         Lactic acid, plasma         Protime-INR         APTT         Urinalysis, w/ Reflex to Culture (Infection Suspected) -Urine, Clean Catch         CBG monitoring, ED       CXR -  NON acute    CTA chest -  nonacute, no PE,  no evidence of infiltrate  1. Negative for acute pulmonary embolus. 2. Diffuse bilateral mosaic attenuation of the lungs, could be secondary to small vessel disease or small  airways disease. 3. Increased size of a cystic lesion in the upper pole of the right kidney, now measuring 6 cm with thin linear areas of enhancement, possibly enhancing septa. When the patient is clinically stable and able to follow directions and hold their breath (preferably as an outpatient) further evaluation with dedicated abdominal MRI should be considered. Following Medications were ordered in ER: Medications  lactated ringers infusion (0 mLs Intravenous Stopped 11/14/22 1826)  lactated ringers bolus 1,000 mL (0 mLs Intravenous Stopped 11/14/22 1826)  iohexol (OMNIPAQUE) 350 MG/ML injection 75 mL (75 mLs Intravenous Contrast Given 11/14/22 1901)    _______________________________________________________ ER Provider Called:  Cardiology    Dr. Jayme Cloud They Recommend admit to medicine no evidence of V. tach   ED Triage Vitals  Enc Vitals Group     BP 11/14/22 1630 102/64     Pulse Rate 11/14/22 1630 (!) 58     Resp 11/14/22 1711 16     Temp 11/14/22 1623 (!) 94.4 F (34.7 C)     Temp Source 11/14/22 1623 Oral     SpO2 11/14/22 1630 98 %     Weight --      Height --      Head Circumference --      Peak Flow --      Pain Score 11/14/22 1613 0     Pain Loc --      Pain Edu? --      Excl. in GC? --   TMAX(24)@     _________________________________________ Significant initial  Findings: Abnormal Labs Reviewed  CBC WITH DIFFERENTIAL/PLATELET - Abnormal; Notable for the following components:      Result Value   WBC 11.0 (*)    RBC 3.45 (*)    Hemoglobin 10.6 (*)    HCT 33.7 (*)    Neutro Abs 8.0 (*)    All other components within  normal limits  COMPREHENSIVE METABOLIC PANEL - Abnormal; Notable for the following components:   Sodium 131 (*)    Glucose, Bld 105 (*)    BUN 28 (*)    Creatinine, Ser 1.20 (*)    Calcium 8.6 (*)    Total Protein 6.0 (*)    GFR, Estimated 47 (*)    All other components within normal limits  URINALYSIS, W/ REFLEX TO CULTURE (INFECTION  SUSPECTED) - Abnormal; Notable for the following components:   APPearance HAZY (*)    Leukocytes,Ua MODERATE (*)    Bacteria, UA RARE (*)    All other components within normal limits  CBG MONITORING, ED - Abnormal; Notable for the following components:   Glucose-Capillary 108 (*)    All other components within normal limits      _________________________ Troponin  ordered   ECG: Ordered Personally reviewed and interpreted by me showing: HR : 63 Rhythm: Sinus rhythm Low voltage, precordial leads RSR' in V1 or V2, probably normal variant  QTC 471   COVID-19 Labs  No results for input(s): "DDIMER", "FERRITIN", "LDH", "CRP" in the last 72 hours.  Lab Results  Component Value Date   SARSCOV2NAA Not Detected 04/04/2019     ____________________ This patient meets SIRS Criteria and may be septic.    The recent clinical data is shown below. Vitals:   11/14/22 1821 11/14/22 1823 11/14/22 1839 11/14/22 2030  BP:  (!) 95/46 99/60 138/69  Pulse:  66 67 65  Resp:  18 20 17   Temp: (!) 95 F (35 C)     TempSrc: Rectal     SpO2:  97% 98% 99%    WBC     Component Value Date/Time   WBC 11.0 (H) 11/14/2022 1625   LYMPHSABS 1.8 11/14/2022 1625   MONOABS 0.8 11/14/2022 1625   EOSABS 0.2 11/14/2022 1625   BASOSABS 0.1 11/14/2022 1625    Lactic Acid, Venous    Component Value Date/Time   LATICACIDVEN 1.6 11/14/2022 1953         UA   no evidence of UTI   Urine analysis:    Component Value Date/Time   COLORURINE YELLOW 11/14/2022 1953   APPEARANCEUR HAZY (A) 11/14/2022 1953   LABSPEC 1.019 11/14/2022 1953   PHURINE 6.0 11/14/2022 1953   GLUCOSEU NEGATIVE 11/14/2022 1953   GLUCOSEU NEGATIVE 06/29/2016 1001   HGBUR NEGATIVE 11/14/2022 1953   BILIRUBINUR NEGATIVE 11/14/2022 1953   KETONESUR NEGATIVE 11/14/2022 1953   PROTEINUR NEGATIVE 11/14/2022 1953   UROBILINOGEN 0.2 06/29/2016 1001   NITRITE NEGATIVE 11/14/2022 1953   LEUKOCYTESUR MODERATE (A) 11/14/2022 1953        __________________________________________________________ Recent Labs  Lab 11/14/22 1625  NA 131*  K 4.3  CO2 22  GLUCOSE 105*  BUN 28*  CREATININE 1.20*  CALCIUM 8.6*  MG 2.2    Cr    Up from baseline see below Lab Results  Component Value Date   CREATININE 1.20 (H) 11/14/2022   CREATININE 0.96 09/29/2022   CREATININE 1.11 01/12/2022    Recent Labs  Lab 11/14/22 1625  AST 28  ALT 17  ALKPHOS 89  BILITOT 0.4  PROT 6.0*  ALBUMIN 3.6   Lab Results  Component Value Date   CALCIUM 8.6 (L) 11/14/2022    Plt: Lab Results  Component Value Date   PLT 276 11/14/2022       Recent Labs  Lab 11/14/22 1625  WBC 11.0*  NEUTROABS 8.0*  HGB 10.6*  HCT 33.7*  MCV 97.7  PLT 276    HG/HCT  Down  from baseline see below    Component Value Date/Time   HGB 10.6 (L) 11/14/2022 1625   HCT 33.7 (L) 11/14/2022 1625   MCV 97.7 11/14/2022 1625    _______________________________________________ Hospitalist was called for admission for   Near syncope,  Hypothermia, Hypotension due to hypovolemia    The following Work up has been ordered so far:  Orders Placed This Encounter  Procedures   Blood Culture (routine x 2)   DG Chest Port 1 View   CT Angio Chest PE W and/or Wo Contrast   CBC with Differential/Platelet   Comprehensive metabolic panel   Magnesium   TSH   Lactic acid, plasma   Protime-INR   APTT   Urinalysis, w/ Reflex to Culture (Infection Suspected) -Urine, Clean Catch   Document height and weight   Assess and Document Glasgow Coma Scale   Document vital signs within 1-hour of fluid bolus completion.  Notify provider of abnormal vital signs despite fluid resuscitation.   Refer to Sidebar Report: Sepsis Bundle ED/IP   Notify provider for difficulties obtaining IV access   Initiate Carrier Fluid Protocol   In and Out Cath   Consult to hospitalist   CBG monitoring, ED   EKG 12-Lead   Insert peripheral IV X 1     OTHER Significant initial   Findings:  labs showing:     DM  labs:  HbA1C: Recent Labs    01/12/22 0927  HGBA1C 6.1       CBG (last 3)  Recent Labs    11/14/22 1609  GLUCAP 108*          Cultures:    Component Value Date/Time   SDES URINE, RANDOM 06/21/2012 0628   SPECREQUEST NONE 06/21/2012 0628   CULT  06/21/2012 0628    Multiple bacterial morphotypes present, none predominant. Suggest appropriate recollection if clinically indicated.   REPTSTATUS 06/22/2012 FINAL 06/21/2012 5409     Radiological Exams on Admission: CT Angio Chest PE W and/or Wo Contrast  Result Date: 11/14/2022 CLINICAL DATA:  Weakness EXAM: CT ANGIOGRAPHY CHEST WITH CONTRAST TECHNIQUE: Multidetector CT imaging of the chest was performed using the standard protocol during bolus administration of intravenous contrast. Multiplanar CT image reconstructions and MIPs were obtained to evaluate the vascular anatomy. RADIATION DOSE REDUCTION: This exam was performed according to the departmental dose-optimization program which includes automated exposure control, adjustment of the mA and/or kV according to patient size and/or use of iterative reconstruction technique. CONTRAST:  75mL OMNIPAQUE IOHEXOL 350 MG/ML SOLN COMPARISON:  CT 07/06/2018, 03/28/2019 FINDINGS: Cardiovascular: Satisfactory opacification of the pulmonary arteries to the segmental level. No evidence of pulmonary embolism. Mild aortic atherosclerosis. No aneurysm. Mild cardiomegaly. No pericardial effusion. Mediastinum/Nodes: Midline trachea. No thyroid mass. No suspicious lymph nodes. Moderate hiatal hernia. Lungs/Pleura: No consolidation or effusion. Diffuse bilateral mosaic attenuation. Upper Abdomen: No acute finding. Increased size of a cystic lesion in the upper pole of the right kidney, now measuring 6 cm with thin linear areas of enhancement, possibly enhancing septa. Musculoskeletal: No acute or suspicious osseous abnormality. Review of the MIP images confirms the above  findings. IMPRESSION: 1. Negative for acute pulmonary embolus. 2. Diffuse bilateral mosaic attenuation of the lungs, could be secondary to small vessel disease or small airways disease. 3. Increased size of a cystic lesion in the upper pole of the right kidney, now measuring 6 cm with thin linear areas of enhancement, possibly enhancing septa.  When the patient is clinically stable and able to follow directions and hold their breath (preferably as an outpatient) further evaluation with dedicated abdominal MRI should be considered. 4. Aortic atherosclerosis. Aortic Atherosclerosis (ICD10-I70.0). Electronically Signed   By: Jasmine Pang M.D.   On: 11/14/2022 19:40   DG Chest Port 1 View  Result Date: 11/14/2022 CLINICAL DATA:  Weakness. EXAM: PORTABLE CHEST 1 VIEW COMPARISON:  None Available. FINDINGS: The heart size and mediastinal contours are within normal limits. Both lungs are clear. The visualized skeletal structures are unremarkable. IMPRESSION: No active disease. Electronically Signed   By: Gerome Sam III M.D.   On: 11/14/2022 17:02   _______________________________________________________________________________________________________ Latest  Blood pressure 138/69, pulse 65, temperature (!) 95 F (35 C), temperature source Rectal, resp. rate 17, SpO2 99 %.   Vitals  labs and radiology finding personally reviewed  Review of Systems:    Pertinent positives include:  fatigue, presyncope  Constitutional:  No weight loss, night sweats, Fevers, chills, weight loss  HEENT:  No headaches, Difficulty swallowing,Tooth/dental problems,Sore throat,  No sneezing, itching, ear ache, nasal congestion, post nasal drip,  Cardio-vascular:  No chest pain, Orthopnea, PND, anasarca, dizziness, palpitations.no Bilateral lower extremity swelling  GI:  No heartburn, indigestion, abdominal pain, nausea, vomiting, diarrhea, change in bowel habits, loss of appetite, melena, blood in stool, hematemesis Resp:   no shortness of breath at rest. No dyspnea on exertion, No excess mucus, no productive cough, No non-productive cough, No coughing up of blood.No change in color of mucus.No wheezing. Skin:  no rash or lesions. No jaundice GU:  no dysuria, change in color of urine, no urgency or frequency. No straining to urinate.  No flank pain.  Musculoskeletal:  No joint pain or no joint swelling. No decreased range of motion. No back pain.  Psych:  No change in mood or affect. No depression or anxiety. No memory loss.  Neuro: no localizing neurological complaints, no tingling, no weakness, no double vision, no gait abnormality, no slurred speech, no confusion  All systems reviewed and apart from HOPI all are negative _______________________________________________________________________________________________ Past Medical History:   Past Medical History:  Diagnosis Date   Allergic rhinitis    Allergy    Anemia    Asthmatic bronchitis 2009   allergies; post nasal drip but not allergies   GERD (gastroesophageal reflux disease)    History of hiatal hernia    Hyperlipidemia    Hypertension    Hypothyroidism    IBS (irritable bowel syndrome)    Insomnia    Irregular heart beat Not sure; long time ago   Periodic irregular heart rate; take verapamil.   Status post dilation of esophageal narrowing    Torticollis 2006   Estimate date; rec'd botox and resolved      Past Surgical History:  Procedure Laterality Date   ABDOMINAL HYSTERECTOMY     with ovaries removed also.   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   COLONOSCOPY     ESOPHAGOGASTRODUODENOSCOPY (EGD) WITH PROPOFOL N/A 12/30/2015   Procedure:  ESOPHAGOGASTRODUODENOSCOPY (EGD) WITH PROPOFOL;  Surgeon: Sherrilyn Rist, MD;  Location: [facility] ENDOSCOPY;  Service: Gastroenterology;  Laterality: N/A;   HIATAL HERNIA REPAIR  N/A 03/09/2016   Procedure: LAPAROSCOPIC REPAIR OF HIATAL HERNIA  NISSEN FUNDOPLICATION UPPER ENDOSCOPY;  Surgeon: Avel Peace, MD;  Location: [facility] ORS;  Service: General;  Laterality: N/A;   NISSEN FUNDOPLICATION N/A 03/09/2016   Procedure: NISSEN FUNDOPLICATION;  Surgeon: Avel Peace, MD;  Location: [facility] ORS;  Service: General;  Laterality: N/A;   TUBAL LIGATION Bilateral 1985   UPPER GI ENDOSCOPY  03/09/2016   Procedure: UPPER GI ENDOSCOPY;  Surgeon: Avel Peace, MD;  Location: [facility] ORS;  Service: General;;    Social History:  Ambulatory   independently      reports that she has never smoked. She has never used smokeless tobacco. She reports current alcohol use of about 1.0 - 2.0 standard drink of alcohol per week. She reports that she does not use drugs.     Family History:  Family History  Problem Relation Age of Onset   Hypertension Mother    Heart disease Father    Stroke Father    Stroke Brother        brain aneurism   Colon polyps Sister    Other Sister        c diff-deceased   Stomach cancer Neg Hx    Rectal cancer Neg Hx    Esophageal cancer Neg Hx    Colon cancer Neg Hx    ______________________________________________________________________________________________ Allergies: Allergies  Allergen Reactions   Lisinopril Cough   Vinegar [Acetic Acid] Anaphylaxis, Shortness Of Breath and Swelling    Throat closes trouble breathing with vinegar.   Metoclopramide Hcl Other (See Comments)    Neck spasms     Prior to Admission medications   Medication Sig Start Date End Date Taking? Authorizing Provider  Aspirin 81 MG CAPS Take by mouth.    [provider]  atorvastatin (LIPITOR) 20 MG tablet Take 1 tablet (20 mg total) by mouth daily. 10/28/22   [name], [name], MD   bismuth subsalicylate (PEPTO BISMOL) 262 MG/15ML suspension Take 30 mLs by mouth every 6 (six) hours as needed for indigestion.    [provider]  carvedilol (COREG) 6.25 MG tablet Take 1 tablet (6.25 mg total) by mouth 2 (two) times daily with a meal. Annual appt due in July  must see provider for future refills 11/08/22   [name], [name], MD  cetirizine (ZYRTEC) 10 MG tablet Take 10 mg by mouth at bedtime.    [provider]  Cholecalciferol 1000 UNITS tablet Take 1,000 Units by mouth daily after supper.  10/13/10   [name], [name], MD  Continuous Blood Gluc Receiver (FREESTYLE LIBRE 2 READER) DEVI 1 Units by Does not apply route every 14 (fourteen) days. 10/12/21   [name], [name], MD  Continuous Blood Gluc Sensor (FREESTYLE LIBRE 2 SENSOR) MISC 1 Units by Does not apply route every 14 (fourteen) days. 10/12/21   [name], [name], MD  diclofenac sodium (VOLTAREN) 1 % GEL Apply 2 g topically 4 (four) times daily. 01/24/19   [name], [name], MD  famotidine (PEPCID) 20 MG tablet Take 1 tablet (20 mg total) by mouth at bedtime. 05/09/20   [name], [name], MD  FLUZONE HIGH-DOSE QUADRIVALENT 0.7 ML SUSY  03/25/22   [provider]  glucose blood (ACCU-CHEK AVIVA) test strip Use to check blood sugars twice a day 10/25/16   [name], [name], MD  Lancets (ACCU-CHEK SOFT TOUCH) lancets Use to check blood sugars twice a day 10/25/16   [name], [name], MD  levothyroxine (SYNTHROID) 100 MCG tablet  Take 100 mcg by mouth daily. 10/03/19   [provider]  meloxicam (MOBIC) 7.5 MG tablet Take 1 tablet (7.5 mg total) by mouth daily. 04/23/21   Vivi Barrack, DPM  pantoprazole (PROTONIX) 40 MG tablet Take 1 tablet (40 mg total) by mouth 2 (two) times daily. Annual appt due in July  must see provider for future refills 11/08/22   [name], [name], MD  TURMERIC PO Take 1 tablet by mouth daily.    [provider]  valsartan (DIOVAN) 80 MG  tablet Take 1 tablet (80 mg total) by mouth daily. Annual appt due in March must see provider for future refills 09/06/22   [name], [name], MD    ___________________________________________________________________________________________________ Physical Exam:    11/14/2022    8:30 PM 11/14/2022    6:39 PM 11/14/2022    6:23 PM  Vitals with BMI  Systolic 138 99 95  Diastolic 69 60 46  Pulse 65 67 66    1. General:  in No  Acute distress   Chronically ill   -appearing 2. Psychological: Alert and   Oriented 3. Head/ENT:    dry Mucous Membranes                          Head Non traumatic, neck supple                           Poor Dentition 4. SKIN decreased Skin turgor,  Skin clean Dry and intact no rash    5. Heart: Regular rate and rhythm no  Murmur, no Rub or gallop 6. Lungs no wheezes or crackles   7. Abdomen: Soft,  non-tender, Non distended  obese  bowel sounds present 8. Lower extremities: no clubbing, cyanosis, no  edema 9. Neurologically Grossly intact, moving all 4 extremities equally  10. MSK: Normal range of motion    Chart has been reviewed  ___________ _____  Assessment/Plan 77 y.o. female with medical history significant of HTN HLD CAD hypothyroidism    Admitted for   Near syncope  Hypothermia, initial encounter  Hypotension due to hypovolemia    Present on Admission:  Hypothyroidism  Dyslipidemia  Asthma  AKI (acute kidney injury) (HCC)  Hyponatremia  Normocytic anemia  Hypothermia  Hypertension  Elevated troponin    Hypothyroidism - Check TSH wnl  continue home medications Synthroid at po q day   Dyslipidemia Chronic stable continue Lipitor 20 mg a day  Asthma Chronic stable albuterol as needed  Spell of generalized weakness Peer review of records patient has recurrent episodes of transient sugar crashes that has been going on for years usually relieved by eating  This is chronic could be in the setting of reactive  hypoglycemia.  Patient can have further workup done as an outpatient with endocrinology  AKI (acute kidney injury) (HCC) Will r rehydrate and follow renal function obtain electrolytes  Hyponatremia In the setting of decreased p.o. intake and decreased solid intake.  Will gently rehydrate follow sodium check orthostatics check your electrolytes  Normocytic anemia Obtain anemia panel Hemoccult stool I suspect hemoglobin will drop after fluid resuscitation.  This could have contributed to patient's presyncopal event  Postural dizziness with presyncope In the setting of relatively low blood sugar dehydration relative anemia.  Likely multifactorial. Obtain orthostatics check echogram monitor on telemetry cycle cardiac enzymes Patient may need further workup for dysautonomia  Hypothermia Transient in the setting of  hypoglycemia. Currently improving we will continue to monitor no evidence of infection at this time  Hypertension Allow permissive hypertension for now  Renal cyst Will need outpatient renal MRI once stable  Elevated troponin Will continue to trend pt does not endorse any CP No sig ECg changes  Echo in am Cardiology is aware of the pt    Other plan as per orders.  DVT prophylaxis:  SCD    Code Status:    Code Status: Prior FULL CODE  as per patient   I had personally discussed CODE STATUS with patient   ACP none   Family Communication:   Family not at  Bedside    Diet regular   Disposition Plan:        To home once workup is complete and patient is stable   Following barriers for discharge:                            Electrolytes corrected                               Anemia stable                                Consult Orders  (From admission, onward)           Start     Ordered   11/14/22 2107  Consult to hospitalist  Once       Provider:  (Not yet assigned)  Question Answer Comment  Place call to: [facility]   Reason for Consult Admit       11/14/22 2107                               Would benefit from PT/OT eval prior to DC  Ordered                    Consults called: Cardiology is aware   Admission status:  ED Disposition     ED Disposition  Admit   Condition  --   Comment  Hospital Area: [facility] [[id number]]  Level of Care: Progressive [102]  Admit to Progressive based on following criteria: CARDIOVASCULAR & THORACIC of moderate stability with acute coronary syndrome symptoms/low risk myocardial infarction/hypertensive urgency/arrhythmias/heart failure potentially compromising stability and stable post cardiovascular intervention patients.  May place patient in observation at [facility] or [facility] if equivalent level of care is available:: No  Covid Evaluation: Asymptomatic - no recent exposure (last 10 days) testing not required  Diagnosis: Postural dizziness with presyncope [[id number]]  Admitting Physician: Therisa Doyne [[id number]]  Attending Physician: Therisa Doyne [[id number]]           Obs      Level of care    progressive tele indefinitely please discontinue once patient no longer qualifies COVID-19 Labs    Lab Results  Component Value Date   SARSCOV2NAA Not Detected 04/04/2019     Therisa Doyne 11/14/2022, 10:15 PM    [facility] Hospitalists     after 2 AM please page floor coverage PA If 7AM-7PM, please contact the day team taking care of the patient using [email or web address]

## 2022-11-14 NOTE — ED Notes (Signed)
Patient transported to CT 

## 2022-11-14 NOTE — Assessment & Plan Note (Signed)
Transient in the setting of hypoglycemia. Currently improving we will continue to monitor no evidence of infection at this time

## 2022-11-14 NOTE — Assessment & Plan Note (Signed)
Will need outpatient renal MRI once stable

## 2022-11-14 NOTE — Consult Note (Signed)
Cardiology Consultation   Patient ID: Cindy Simpson MRN: [id number]; DOB: 01/05/1946  Admit date: 11/14/2022 Date of Consult: 11/14/2022  PCP:  Tresa Garter, MD   [facility] Providers Cardiologist:  None        Patient Profile:   Cindy Simpson is a 77 y.o. female with a hx of stress induced cardiomyopathy in 2017, hyperlipidemia, hypothyroidism, hypertension, GERD and IBS who is being seen 11/14/2022 for the evaluation of ventricular tachycardia at the request of urgency department.  History of Present Illness:   Cindy Simpson was brought to the emergency department today by EMS after abnormal behavior at home and generalized weakness.  History was obtained by both the patient and her husband.  Patient woke up this morning in her usual state of health, had breakfast and did not feel any particular issues.  Around 2 PM she was getting ready to have popcorn for lunch, started to prep it and her husband started addressing her but she did not respond back.  Patient remembers the episode and states that she did not have anything to say at the moment.  However, husband states that her behavior was not normal and despite him telling her to talk to him, she was silent.  This is uncommon for her.  She then sat down at the kitchen table and started to eat.  Shortly after, she felt generalized weakness and became diaphoretic.  Patient denies chest pain, lightheadedness, presyncope.  She did not pass out.  Her husband called 911 and upon arrival glucose was 75 and first ECG showed findings concerning for ventricular tachycardia.  Because of this reason she was brought to the emergency department.  Upon close observation of the ECG strips, they appear consistent with artifact. Patient feels back to baseline.  Cardiac review of systems is negative.  She was hypothermic on arrival of 94.4 with blood pressures ranging between 77/47 and 95/52 mmHg.  White count was slightly elevated.  No  acute findings on chest x-ray.  Past Medical History:  Diagnosis Date   Allergic rhinitis    Allergy    Anemia    Asthmatic bronchitis 2009   allergies; post nasal drip but not allergies   GERD (gastroesophageal reflux disease)    History of hiatal hernia    Hyperlipidemia    Hypertension    Hypothyroidism    IBS (irritable bowel syndrome)    Insomnia    Irregular heart beat Not sure; long time ago   Periodic irregular heart rate; take verapamil.   Status post dilation of esophageal narrowing    Torticollis 2006   Estimate date; rec'd botox and resolved    Past Surgical History:  Procedure Laterality Date   ABDOMINAL HYSTERECTOMY     with ovaries removed also.   CARDIAC CATHETERIZATION N/A 09/13/2015   Procedure: Left Heart Cath and Coronary Angiography;  Surgeon: Rinaldo Cloud, MD;  Location: [facility] INVASIVE CV LAB;  Service: Cardiovascular;  Laterality: N/A;   CATARACT EXTRACTION Bilateral 2014   approx 2 years ago   CATARACT EXTRACTION     CLEFT PALATE REPAIR     child   COLONOSCOPY     ESOPHAGOGASTRODUODENOSCOPY (EGD) WITH PROPOFOL N/A 12/30/2015   Procedure: ESOPHAGOGASTRODUODENOSCOPY (EGD) WITH PROPOFOL;  Surgeon: Sherrilyn Rist, MD;  Location: [facility] ENDOSCOPY;  Service: Gastroenterology;  Laterality: N/A;   HIATAL HERNIA REPAIR N/A 03/09/2016   Procedure: LAPAROSCOPIC REPAIR OF HIATAL HERNIA  NISSEN FUNDOPLICATION UPPER ENDOSCOPY;  Surgeon: Avel Peace, MD;  Location: [facility] ORS;  Service: General;  Laterality: N/A;   NISSEN FUNDOPLICATION N/A 03/09/2016   Procedure: NISSEN FUNDOPLICATION;  Surgeon: Avel Peace, MD;  Location: [facility] ORS;  Service: General;  Laterality: N/A;   TUBAL LIGATION Bilateral 1985   UPPER GI ENDOSCOPY  03/09/2016   Procedure: UPPER GI ENDOSCOPY;  Surgeon: Avel Peace, MD;  Location: [facility] ORS;  Service: General;;     Home Medications:  Prior to Admission medications   Medication Sig Start Date End Date Taking? Authorizing Provider  Aspirin 81  MG CAPS Take by mouth.    [provider]  atorvastatin (LIPITOR) 20 MG tablet Take 1 tablet (20 mg total) by mouth daily. 10/28/22   [name], [name], MD  bismuth subsalicylate (PEPTO BISMOL) 262 MG/15ML suspension Take 30 mLs by mouth every 6 (six) hours as needed for indigestion.    [provider]  carvedilol (COREG) 6.25 MG tablet Take 1 tablet (6.25 mg total) by mouth 2 (two) times daily with a meal. Annual appt due in July  must see provider for future refills 11/08/22   [name], [name], MD  cetirizine (ZYRTEC) 10 MG tablet Take 10 mg by mouth at bedtime.    [provider]  Cholecalciferol 1000 UNITS tablet Take 1,000 Units by mouth daily after supper.  10/13/10   [name], [name], MD  Continuous Blood Gluc Receiver (FREESTYLE LIBRE 2 READER) DEVI 1 Units by Does not apply route every 14 (fourteen) days. 10/12/21   [name], [name], MD  Continuous Blood Gluc Sensor (FREESTYLE LIBRE 2 SENSOR) MISC 1 Units by Does not apply route every 14 (fourteen) days. 10/12/21   [name], [name], MD  diclofenac sodium (VOLTAREN) 1 % GEL Apply 2 g topically 4 (four) times daily. 01/24/19   [name], [name], MD  famotidine (PEPCID) 20 MG tablet Take 1 tablet (20 mg total) by mouth at bedtime. 05/09/20   [name], [name], MD  FLUZONE HIGH-DOSE QUADRIVALENT 0.7 ML SUSY  03/25/22   [provider]  glucose blood (ACCU-CHEK AVIVA) test strip Use to check blood sugars twice a day 10/25/16   [name], [name], MD  Lancets (ACCU-CHEK SOFT TOUCH) lancets Use to check blood sugars twice a day 10/25/16   [name], [name], MD  levothyroxine (SYNTHROID) 100 MCG tablet Take 100 mcg by mouth daily. 10/03/19   [provider]  meloxicam (MOBIC) 7.5 MG tablet Take 1 tablet (7.5 mg total) by mouth daily. 04/23/21   Vivi Barrack, DPM  pantoprazole (PROTONIX) 40 MG tablet Take 1 tablet (40 mg total) by mouth 2 (two) times daily. Annual appt due in July  must  see provider for future refills 11/08/22   [name], [name], MD  TURMERIC PO Take 1 tablet by mouth daily.    [provider]  valsartan (DIOVAN) 80 MG tablet Take 1 tablet (80 mg total) by mouth daily. Annual appt due in March must see provider for future refills 09/06/22   [name], [name], MD    Inpatient Medications: Scheduled Meds:  Continuous Infusions:  lactated ringers Stopped (11/14/22 1826)   PRN Meds:   Allergies:    Allergies  Allergen Reactions   Lisinopril Cough   Vinegar [Acetic Acid] Anaphylaxis, Shortness Of Breath and Swelling    Throat closes trouble breathing with vinegar.   Metoclopramide Hcl Other (See Comments)    Neck spasms    Social History:   Social History   Socioeconomic History   Marital status: Married  Spouse name: Not on file   Number of children: 0   Years of education: Not on file   Highest education level: Not on file  Occupational History   Occupation: Retired  Tobacco Use   Smoking status: Never   Smokeless tobacco: Never  Vaping Use   Vaping Use: Never used  Substance and Sexual Activity   Alcohol use: Yes    Alcohol/week: 1.0 - 2.0 standard drink of alcohol    Types: 1 - 2 Shots of liquor per week    Comment: Perhaps every couple of weeks; not very frequently   Drug use: No   Sexual activity: Never  Other Topics Concern   Not on file  Social History Narrative   Not on file   Social Determinants of Health   Financial Resource Strain: Low Risk  (08/26/2022)   Overall Financial Resource Strain (CARDIA)    Difficulty of Paying Living Expenses: Not hard at all  Food Insecurity: No Food Insecurity (08/26/2022)   Hunger Vital Sign    Worried About Running Out of Food in the Last Year: Never true    Ran Out of Food in the Last Year: Never true  Transportation Needs: No Transportation Needs (08/26/2022)   PRAPARE - Administrator, Civil Service (Medical): No    Lack of Transportation (Non-Medical): No   Physical Activity: Inactive (08/26/2022)   Exercise Vital Sign    Days of Exercise per Week: 0 days    Minutes of Exercise per Session: 0 min  Stress: No Stress Concern Present (08/26/2022)   [employer] of Occupational Health - Occupational Stress Questionnaire    Feeling of Stress : Not at all  Social Connections: Unknown (08/26/2022)   Social Connection and Isolation Panel [NHANES]    Frequency of Communication with Friends and Family: More than three times a week    Frequency of Social Gatherings with Friends and Family: Once a week    Attends Religious Services: Patient declined    Database administrator or Organizations: No    Attends Banker Meetings: Patient declined    Marital Status: Married  Catering manager Violence: Not At Risk (08/26/2022)   Humiliation, Afraid, Rape, and Kick questionnaire    Fear of Current or Ex-Partner: No    Emotionally Abused: No    Physically Abused: No    Sexually Abused: No    Family History:    Family History  Problem Relation Age of Onset   Hypertension Mother    Heart disease Father    Stroke Father    Stroke Brother        brain aneurism   Colon polyps Sister    Other Sister        c diff-deceased   Stomach cancer Neg Hx    Rectal cancer Neg Hx    Esophageal cancer Neg Hx    Colon cancer Neg Hx      ROS:  Please see the history of present illness.  All other ROS reviewed and negative.     Physical Exam/Data:   Vitals:   11/14/22 1821 11/14/22 1823 11/14/22 1839 11/14/22 2030  BP:  (!) 95/46 99/60 138/69  Pulse:  66 67 65  Resp:  18 20 17   Temp: (!) 95 F (35 C)     TempSrc: Rectal     SpO2:  97% 98% 99%   No intake or output data in the 24 hours ending 11/14/22 2059    09/29/2022  9:18 AM 08/26/2022    1:03 PM 06/21/2022    9:58 AM  Last 3 Weights  Weight (lbs) 145 lb 140 lb 144 lb  Weight (kg) 65.772 kg 63.504 kg 65.318 kg     There is no height or weight on file to calculate BMI.  General:   Well nourished, well developed, in no acute distress HEENT: normal Neck: no JVD Vascular: No carotid bruits; Distal pulses 2+ bilaterally Cardiac:  normal S1, S2; RRR; no murmur  Lungs:  clear to auscultation bilaterally, no wheezing, rhonchi or rales  Abd: soft, nontender, no hepatomegaly  Ext: no edema Musculoskeletal:  No deformities, BUE and BLE strength normal and equal Skin: warm and dry  Neuro:  CNs 2-12 intact, no focal abnormalities noted Psych:  Normal affect   EKG:  The EKG was personally reviewed and demonstrates:  NSR Telemetry:  Telemetry was personally reviewed and demonstrates:  NSR  Relevant CV Studies:  Laboratory Data:  High Sensitivity Troponin:  No results for input(s): "TROPONINIHS" in the last 720 hours.   Chemistry Recent Labs  Lab 11/14/22 1625  NA 131*  K 4.3  CL 99  CO2 22  GLUCOSE 105*  BUN 28*  CREATININE 1.20*  CALCIUM 8.6*  MG 2.2  GFRNONAA 47*  ANIONGAP 10    Recent Labs  Lab 11/14/22 1625  PROT 6.0*  ALBUMIN 3.6  AST 28  ALT 17  ALKPHOS 89  BILITOT 0.4   Lipids No results for input(s): "CHOL", "TRIG", "HDL", "LABVLDL", "LDLCALC", "CHOLHDL" in the last 168 hours.  Hematology Recent Labs  Lab 11/14/22 1625  WBC 11.0*  RBC 3.45*  HGB 10.6*  HCT 33.7*  MCV 97.7  MCH 30.7  MCHC 31.5  RDW 12.7  PLT 276   Thyroid  Recent Labs  Lab 11/14/22 1625  TSH 1.831    BNPNo results for input(s): "BNP", "PROBNP" in the last 168 hours.  DDimer No results for input(s): "DDIMER" in the last 168 hours.   Radiology/Studies:  CT Angio Chest PE W and/or Wo Contrast  Result Date: 11/14/2022 CLINICAL DATA:  Weakness EXAM: CT ANGIOGRAPHY CHEST WITH CONTRAST TECHNIQUE: Multidetector CT imaging of the chest was performed using the standard protocol during bolus administration of intravenous contrast. Multiplanar CT image reconstructions and MIPs were obtained to evaluate the vascular anatomy. RADIATION DOSE REDUCTION: This exam was  performed according to the departmental dose-optimization program which includes automated exposure control, adjustment of the mA and/or kV according to patient size and/or use of iterative reconstruction technique. CONTRAST:  75mL OMNIPAQUE IOHEXOL 350 MG/ML SOLN COMPARISON:  CT 07/06/2018, 03/28/2019 FINDINGS: Cardiovascular: Satisfactory opacification of the pulmonary arteries to the segmental level. No evidence of pulmonary embolism. Mild aortic atherosclerosis. No aneurysm. Mild cardiomegaly. No pericardial effusion. Mediastinum/Nodes: Midline trachea. No thyroid mass. No suspicious lymph nodes. Moderate hiatal hernia. Lungs/Pleura: No consolidation or effusion. Diffuse bilateral mosaic attenuation. Upper Abdomen: No acute finding. Increased size of a cystic lesion in the upper pole of the right kidney, now measuring 6 cm with thin linear areas of enhancement, possibly enhancing septa. Musculoskeletal: No acute or suspicious osseous abnormality. Review of the MIP images confirms the above findings. IMPRESSION: 1. Negative for acute pulmonary embolus. 2. Diffuse bilateral mosaic attenuation of the lungs, could be secondary to small vessel disease or small airways disease. 3. Increased size of a cystic lesion in the upper pole of the right kidney, now measuring 6 cm with thin linear areas of enhancement, possibly enhancing septa. When the  patient is clinically stable and able to follow directions and hold their breath (preferably as an outpatient) further evaluation with dedicated abdominal MRI should be considered. 4. Aortic atherosclerosis. Aortic Atherosclerosis (ICD10-I70.0). Electronically Signed   By: Jasmine Pang M.D.   On: 11/14/2022 19:40   DG Chest Port 1 View  Result Date: 11/14/2022 CLINICAL DATA:  Weakness. EXAM: PORTABLE CHEST 1 VIEW COMPARISON:  None Available. FINDINGS: The heart size and mediastinal contours are within normal limits. Both lungs are clear. The visualized skeletal structures  are unremarkable. IMPRESSION: No active disease. Electronically Signed   By: Gerome Sam III M.D.   On: 11/14/2022 17:02      Assessment and Plan:   77 year old with medical history of a stress-induced cardiomyopathy in 2017, hypertension, hyperlipidemia, hypothyroidism, IBS, GERD who presented to the emergency department after abnormal behavior at home associated with diaphoresis.  Upon arrival, patient hypothermic and hypotensive with elevated white count.  This appears consistent with an acute infectious process Cardiology was consulted given suspected ventricular tachycardia based on EMS ECG.  The findings are more consistent with artifact since it can be seen how in lead III there are discernible QRSs that march out. Her cardiac examination is relevant for a systolic ejection murmur in the right upper sternal border consistent with aortic stenosis.  This is early peaking with S2 closure sound still audible.  Echocardiogram in the morning.  Possible sepsis History of stress-induced cardiomyopathy Hypertension Hyperlipidemia Hypothyroidism IBS  Risk Assessment/Risk Scores:                For questions or updates, please contact [facility] Please consult [email or web address] for contact info under    Signed, Regan Rakers, MD  11/14/2022 8:59 PM

## 2022-11-14 NOTE — Assessment & Plan Note (Signed)
Chronic stable albuterol as needed ?

## 2022-11-14 NOTE — Assessment & Plan Note (Signed)
-   Check TSH wnl  continue home medications Synthroid at po q day

## 2022-11-14 NOTE — ED Notes (Signed)
ED TO INPATIENT HANDOFF REPORT  ED Nurse Name and Phone #: [phone]  S Name/Age/Gender Cindy Simpson 77 y.o. female Room/Bed: 035C/035C  Code Status   Code Status: Full Code  Home/SNF/Other Home Patient oriented to: self, place, time, and situation Is this baseline? Yes   Triage Complete: Triage complete  Chief Complaint Postural dizziness with presyncope [R42, R55]  Triage Note Pt BIB [facility] for near syncope at home. Pt was in kitchen and went to go stand up and became diaphoretic and weak and had to sit down. EMS initial CBG 75, gave her a cup of juice and rechecked CBG 143. On the way to the hospital EMS caught a run of [place] on their monitor, pt returned to NSR.   BP 120/90    Allergies Allergies  Allergen Reactions   Lisinopril Cough   Vinegar [Acetic Acid] Anaphylaxis, Shortness Of Breath and Swelling    Throat closes trouble breathing with vinegar.   Metoclopramide Hcl Other (See Comments)    Neck spasms    Level of Care/Admitting Diagnosis ED Disposition     ED Disposition  Admit   Condition  --   Comment  Hospital Area: [facility] [[id number]]  Level of Care: Progressive [102]  Admit to Progressive based on following criteria: CARDIOVASCULAR & THORACIC of moderate stability with acute coronary syndrome symptoms/low risk myocardial infarction/hypertensive urgency/arrhythmias/heart failure potentially compromising stability and stable post cardiovascular intervention patients.  May place patient in observation at [facility] or [facility] if equivalent level of care is available:: No  Covid Evaluation: Asymptomatic - no recent exposure (last 10 days) testing not required  Diagnosis: Postural dizziness with presyncope [[id number]]  Admitting Physician: Therisa Doyne [[id number]]  Attending Physician: Therisa Doyne [[id number]]          B Medical/Surgery History Past Medical History:  Diagnosis Date   Allergic rhinitis    Allergy     Anemia    Asthmatic bronchitis 2009   allergies; post nasal drip but not allergies   GERD (gastroesophageal reflux disease)    History of hiatal hernia    Hyperlipidemia    Hypertension    Hypothyroidism    IBS (irritable bowel syndrome)    Insomnia    Irregular heart beat Not sure; long time ago   Periodic irregular heart rate; take verapamil.   Status post dilation of esophageal narrowing    Torticollis 2006   Estimate date; rec'd botox and resolved   Past Surgical History:  Procedure Laterality Date   ABDOMINAL HYSTERECTOMY     with ovaries removed also.   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   COLONOSCOPY     ESOPHAGOGASTRODUODENOSCOPY (EGD) WITH PROPOFOL N/A 12/30/2015   Procedure: ESOPHAGOGASTRODUODENOSCOPY (EGD) WITH PROPOFOL;  Surgeon: Sherrilyn Rist, MD;  Location: [facility] ENDOSCOPY;  Service: Gastroenterology;  Laterality: N/A;   HIATAL HERNIA REPAIR N/A 03/09/2016   Procedure: LAPAROSCOPIC REPAIR OF HIATAL HERNIA  NISSEN FUNDOPLICATION UPPER ENDOSCOPY;  Surgeon: Avel Peace, MD;  Location: [facility] ORS;  Service: General;  Laterality: N/A;   NISSEN FUNDOPLICATION N/A 03/09/2016   Procedure: NISSEN FUNDOPLICATION;  Surgeon: Avel Peace, MD;  Location: [facility] ORS;  Service: General;  Laterality:  N/A;   TUBAL LIGATION Bilateral 1985   UPPER GI ENDOSCOPY  03/09/2016   Procedure: UPPER GI ENDOSCOPY;  Surgeon: Avel Peace, MD;  Location: [facility] ORS;  Service: General;;     A IV Location/Drains/Wounds Patient Lines/Drains/Airways Status     Active Line/Drains/Airways     Name Placement date Placement time Site Days   Peripheral IV 11/14/22 20 G Right Antecubital 11/14/22  1604  Antecubital  less than 1    Peripheral IV 11/14/22 20 G Left;Posterior Forearm 11/14/22  1704  Forearm  less than 1   Incision (Closed) 03/09/16 Abdomen 03/09/16  0955  -- 2441   Incision - 6 Ports Abdomen Right;Lateral Right;Upper;Medial Umbilicus Left;Upper;Medial Left;Medial;Lateral Left;Lateral 03/09/16  0801  -- 2441            Intake/Output Last 24 hours No intake or output data in the 24 hours ending 11/14/22 2158  Labs/Imaging Results for orders placed or performed during the hospital encounter of 11/14/22 (from the past 48 hour(s))  CBG monitoring, ED     Status: Abnormal   Collection Time: 11/14/22  4:09 PM  Result Value Ref Range   Glucose-Capillary 108 (H) 70 - 99 mg/dL    Comment: Glucose reference range applies only to samples taken after fasting for at least 8 hours.  CBC with Differential/Platelet     Status: Abnormal   Collection Time: 11/14/22  4:25 PM  Result Value Ref Range   WBC 11.0 (H) 4.0 - 10.5 K/uL   RBC 3.45 (L) 3.87 - 5.11 MIL/uL   Hemoglobin 10.6 (L) 12.0 - 15.0 g/dL   HCT 16.1 (L) 09.6 - 04.5 %   MCV 97.7 80.0 - 100.0 fL   MCH 30.7 26.0 - 34.0 pg   MCHC 31.5 30.0 - 36.0 g/dL   RDW 40.9 81.1 - 91.4 %   Platelets 276 150 - 400 K/uL    Comment: REPEATED TO VERIFY   nRBC 0.0 0.0 - 0.2 %   Neutrophils Relative % 72 %   Neutro Abs 8.0 (H) 1.7 - 7.7 K/uL   Lymphocytes Relative 17 %   Lymphs Abs 1.8 0.7 - 4.0 K/uL   Monocytes Relative 8 %   Monocytes Absolute 0.8 0.1 - 1.0 K/uL   Eosinophils Relative 2 %   Eosinophils Absolute 0.2 0.0 - 0.5 K/uL   Basophils Relative 1 %   Basophils Absolute 0.1 0.0 - 0.1 K/uL   Immature Granulocytes 0 %   Abs Immature Granulocytes 0.03 0.00 - 0.07 K/uL    Comment: Performed at [facility], [address], [place], [place] [address]  Comprehensive metabolic panel     Status: Abnormal   Collection Time: 11/14/22  4:25 PM  Result Value Ref Range   Sodium 131 (L) 135 - 145 mmol/L   Potassium 4.3 3.5 - 5.1 mmol/L   Chloride 99 98 - 111  mmol/L   CO2 22 22 - 32 mmol/L   Glucose, Bld 105 (H) 70 - 99 mg/dL    Comment: Glucose reference range applies only to samples taken after fasting for at least 8 hours.   BUN 28 (H) 8 - 23 mg/dL   Creatinine, Ser 6.21 (H) 0.44 - 1.00 mg/dL   Calcium 8.6 (L) 8.9 - 10.3 mg/dL   Total Protein 6.0 (L) 6.5 - 8.1 g/dL   Albumin 3.6 3.5 - 5.0 g/dL   AST 28 15 - 41 U/L   ALT 17 0 - 44 U/L   Alkaline Phosphatase  89 38 - 126 U/L   Total Bilirubin 0.4 0.3 - 1.2 mg/dL   GFR, Estimated 47 (L) >60 mL/min    Comment: (NOTE) Calculated using the CKD-EPI Creatinine Equation (2021)    Anion gap 10 5 - 15    Comment: Performed at [facility], [address] [place], [place] [address]  Magnesium     Status: None   Collection Time: 11/14/22  4:25 PM  Result Value Ref Range   Magnesium 2.2 1.7 - 2.4 mg/dL    Comment: Performed at [facility], [address] [place], [place] [address]  TSH     Status: None   Collection Time: 11/14/22  4:25 PM  Result Value Ref Range   TSH 1.831 0.350 - 4.500 uIU/mL    Comment: Performed by a 3rd Generation assay with a functional sensitivity of <=0.01 uIU/mL. Performed at [facility], [address] [place], [place] [address]   Protime-INR     Status: None   Collection Time: 11/14/22  4:25 PM  Result Value Ref Range   Prothrombin Time 14.7 11.4 - 15.2 seconds   INR 1.2 0.8 - 1.2    Comment: (NOTE) INR goal varies based on device and disease states. Performed at [facility], [address] [place], [place] [address]   APTT     Status: None   Collection Time: 11/14/22  4:25 PM  Result Value Ref Range   aPTT 24 24 - 36 seconds    Comment: Performed at [facility], [address] [place], [place] [address]  Lactic acid, plasma     Status: None   Collection Time: 11/14/22  4:28 PM  Result Value Ref Range   Lactic Acid, Venous 1.2 0.5 - 1.9 mmol/L    Comment: Performed at [facility], [address],  [place], [place] [address]  Lactic acid, plasma     Status: None   Collection Time: 11/14/22  7:53 PM  Result Value Ref Range   Lactic Acid, Venous 1.6 0.5 - 1.9 mmol/L    Comment: Performed at [facility], [address] [place], [place] [address]  Urinalysis, w/ Reflex to Culture (Infection Suspected) -Urine, Clean Catch     Status: Abnormal   Collection Time: 11/14/22  7:53 PM  Result Value Ref Range   Specimen Source URINE, CLEAN CATCH    Color, Urine YELLOW YELLOW   APPearance HAZY (A) CLEAR   Specific Gravity, Urine 1.019 1.005 - 1.030   pH 6.0 5.0 - 8.0   Glucose, UA NEGATIVE NEGATIVE mg/dL   Hgb urine dipstick NEGATIVE NEGATIVE   Bilirubin Urine NEGATIVE NEGATIVE   Ketones, ur NEGATIVE NEGATIVE mg/dL   Protein, ur NEGATIVE NEGATIVE mg/dL   Nitrite NEGATIVE NEGATIVE   Leukocytes,Ua MODERATE (A) NEGATIVE   RBC / HPF 0-5 0 - 5 RBC/hpf   WBC, UA 0-5 0 - 5 WBC/hpf    Comment:        Reflex urine culture not performed if WBC <=10, OR if Squamous epithelial cells >5. If Squamous epithelial cells >5 suggest recollection.    Bacteria, UA RARE (A) NONE SEEN   Squamous Epithelial / HPF 0-5 0 - 5 /HPF    Comment: Performed at [facility], [address], [place], [place] [address]   CT Angio Chest PE W and/or Wo Contrast  Result Date: 11/14/2022 CLINICAL DATA:  Weakness EXAM: CT ANGIOGRAPHY CHEST WITH  CONTRAST TECHNIQUE: Multidetector CT imaging of the chest was performed using the standard protocol during bolus administration of intravenous contrast. Multiplanar CT image reconstructions and MIPs were obtained to evaluate the vascular anatomy. RADIATION DOSE REDUCTION: This exam was performed according to the departmental dose-optimization program which includes automated exposure control, adjustment of the mA and/or kV according to patient size and/or use of iterative reconstruction technique. CONTRAST:  75mL OMNIPAQUE IOHEXOL 350 MG/ML SOLN COMPARISON:  CT 07/06/2018,  03/28/2019 FINDINGS: Cardiovascular: Satisfactory opacification of the pulmonary arteries to the segmental level. No evidence of pulmonary embolism. Mild aortic atherosclerosis. No aneurysm. Mild cardiomegaly. No pericardial effusion. Mediastinum/Nodes: Midline trachea. No thyroid mass. No suspicious lymph nodes. Moderate hiatal hernia. Lungs/Pleura: No consolidation or effusion. Diffuse bilateral mosaic attenuation. Upper Abdomen: No acute finding. Increased size of a cystic lesion in the upper pole of the right kidney, now measuring 6 cm with thin linear areas of enhancement, possibly enhancing septa. Musculoskeletal: No acute or suspicious osseous abnormality. Review of the MIP images confirms the above findings. IMPRESSION: 1. Negative for acute pulmonary embolus. 2. Diffuse bilateral mosaic attenuation of the lungs, could be secondary to small vessel disease or small airways disease. 3. Increased size of a cystic lesion in the upper pole of the right kidney, now measuring 6 cm with thin linear areas of enhancement, possibly enhancing septa. When the patient is clinically stable and able to follow directions and hold their breath (preferably as an outpatient) further evaluation with dedicated abdominal MRI should be considered. 4. Aortic atherosclerosis. Aortic Atherosclerosis (ICD10-I70.0). Electronically Signed   By: Jasmine Pang M.D.   On: 11/14/2022 19:40   DG Chest Port 1 View  Result Date: 11/14/2022 CLINICAL DATA:  Weakness. EXAM: PORTABLE CHEST 1 VIEW COMPARISON:  None Available. FINDINGS: The heart size and mediastinal contours are within normal limits. Both lungs are clear. The visualized skeletal structures are unremarkable. IMPRESSION: No active disease. Electronically Signed   By: Gerome Sam III M.D.   On: 11/14/2022 17:02    Pending Labs Unresulted Labs (From admission, onward)     Start     Ordered   11/15/22 0500  Prealbumin  Tomorrow morning,   R        11/14/22 2135   11/14/22  2154  SARS Coronavirus 2 by RT PCR (hospital order, performed in [facility] hospital lab) *cepheid single result test* Anterior Nasal Swab  (Tier 2 - SARS Coronavirus 2 by RT PCR (hospital order, performed in [facility] hospital lab) *cepheid single result test*)  Once,   R        11/14/22 2153   11/14/22 2144  Cortisol  Once,   R        11/14/22 2144   11/14/22 2136  CK  Add-on,   AD        11/14/22 2135   11/14/22 2136  Phosphorus  Add-on,   AD        11/14/22 2135   11/14/22 2136  Osmolality, urine  Once,   R        11/14/22 2135   11/14/22 2136  Osmolality  Add-on,   AD        11/14/22 2135   11/14/22 2136  Creatinine, urine, random  Once,   R        11/14/22 2135   11/14/22 2136  Sodium, urine, random  Once,   R        11/14/22 2135   11/14/22 2136  Vitamin B12  (  Anemia Panel (PNL))  Once,   R        11/14/22 2135   11/14/22 2136  Folate  (Anemia Panel (PNL))  Once,   R        11/14/22 2135   11/14/22 2136  Iron and TIBC  (Anemia Panel (PNL))  Once,   R        11/14/22 2135   11/14/22 2136  Ferritin  (Anemia Panel (PNL))  Once,   R        11/14/22 2135   11/14/22 2136  Reticulocytes  (Anemia Panel (PNL))  Once,   R        11/14/22 2135   11/14/22 2134  Basic metabolic panel  Once,   STAT        11/14/22 2133   11/14/22 2134  CBC  Once,   STAT        11/14/22 2133   11/14/22 1628  Blood Culture (routine x 2)  (Undifferentiated presentation (screening labs and basic nursing orders))  BLOOD CULTURE X 2,   STAT      11/14/22 1627   Unscheduled  Occult blood card to lab, stool RN will collect  As needed,   R     Question:  Specimen to be collected by:  Answer:  RN will collect   11/14/22 2133   Signed and Held  Magnesium  Tomorrow morning,   R        Signed and Held   Signed and Held  Phosphorus  Tomorrow morning,   R        Signed and Held   Signed and Held  Comprehensive metabolic panel  Tomorrow morning,   R       Question:  Release to patient  Answer:  Immediate    Signed and Held   Signed and Held  CBC  Tomorrow morning,   R       Question:  Release to patient  Answer:  Immediate   Signed and Held            Vitals/Pain Today's Vitals   11/14/22 1821 11/14/22 1823 11/14/22 1839 11/14/22 2030  BP:  (!) 95/46 99/60 138/69  Pulse:  66 67 65  Resp:  18 20 17   Temp: (!) 95 F (35 C)     TempSrc: Rectal     SpO2:  97% 98% 99%  PainSc:        Isolation Precautions Airborne and Contact precautions  Medications Medications  lactated ringers infusion (0 mLs Intravenous Stopped 11/14/22 1826)  lactated ringers bolus 1,000 mL (0 mLs Intravenous Stopped 11/14/22 1826)  iohexol (OMNIPAQUE) 350 MG/ML injection 75 mL (75 mLs Intravenous Contrast Given 11/14/22 1901)    Mobility walks     Focused Assessments Neuro Assessment Handoff:  Swallow screen pass? Yes  Cardiac Rhythm: Normal sinus rhythm       Neuro Assessment: Within Defined Limits Neuro Checks:      Has TPA been given? No If patient is a Neuro Trauma and patient is going to OR before floor call report to 4N Charge nurse: [phone] or [phone]   R Recommendations: See Admitting Provider Note  Report given to:   Additional Notes:

## 2022-11-14 NOTE — Assessment & Plan Note (Signed)
In the setting of decreased p.o. intake and decreased solid intake.  Will gently rehydrate follow sodium check orthostatics check your electrolytes

## 2022-11-14 NOTE — Assessment & Plan Note (Signed)
Obtain anemia panel Hemoccult stool I suspect hemoglobin will drop after fluid resuscitation.  This could have contributed to patient's presyncopal event

## 2022-11-14 NOTE — Assessment & Plan Note (Signed)
In the setting of relatively low blood sugar dehydration relative anemia.  Likely multifactorial. Obtain orthostatics check echogram monitor on telemetry cycle cardiac enzymes Patient may need further workup for dysautonomia

## 2022-11-14 NOTE — Subjective & Objective (Signed)
Come is with near syncope, patient was in the kitchen tried to stand up and became diaphoretic and weak and had to sit down. On arrival by EMS CBG 75 she got a cup of juice and it went up to 143 in the way to the hospital patient had a run of [place]. t[place] on event monitor but then returned sinus Patient has history of palpitations some sort of rhythm abnormality for which she takes verapamil.  She went to the kitchen to get a snack when she started to feel very weak and tired.  Patient is feeling back to baseline right now no associated chest pain or palpitations no shortness of breath no nausea no vomiting

## 2022-11-15 ENCOUNTER — Other Ambulatory Visit: Payer: Self-pay | Admitting: Home Health

## 2022-11-15 ENCOUNTER — Encounter (HOSPITAL_COMMUNITY): Payer: Self-pay | Admitting: Internal Medicine

## 2022-11-15 ENCOUNTER — Other Ambulatory Visit (HOSPITAL_COMMUNITY): Payer: Self-pay

## 2022-11-15 ENCOUNTER — Observation Stay (HOSPITAL_BASED_OUTPATIENT_CLINIC_OR_DEPARTMENT_OTHER): Payer: Medicare Other

## 2022-11-15 DIAGNOSIS — R42 Dizziness and giddiness: Secondary | ICD-10-CM | POA: Diagnosis not present

## 2022-11-15 DIAGNOSIS — R55 Syncope and collapse: Secondary | ICD-10-CM

## 2022-11-15 DIAGNOSIS — R7989 Other specified abnormal findings of blood chemistry: Secondary | ICD-10-CM | POA: Diagnosis present

## 2022-11-15 DIAGNOSIS — I499 Cardiac arrhythmia, unspecified: Secondary | ICD-10-CM

## 2022-11-15 LAB — COMPREHENSIVE METABOLIC PANEL
ALT: 15 U/L (ref 0–44)
AST: 26 U/L (ref 15–41)
Albumin: 3.3 g/dL — ABNORMAL LOW (ref 3.5–5.0)
Alkaline Phosphatase: 87 U/L (ref 38–126)
Anion gap: 10 (ref 5–15)
BUN: 21 mg/dL (ref 8–23)
CO2: 23 mmol/L (ref 22–32)
Calcium: 8.8 mg/dL — ABNORMAL LOW (ref 8.9–10.3)
Chloride: 99 mmol/L (ref 98–111)
Creatinine, Ser: 0.95 mg/dL (ref 0.44–1.00)
GFR, Estimated: >60 mL/min (ref >60)
Glucose, Bld: 111 mg/dL — ABNORMAL HIGH (ref 70–99)
Potassium: 4.5 mmol/L (ref 3.5–5.1)
Sodium: 132 mmol/L — ABNORMAL LOW (ref 135–145)
Total Bilirubin: 0.9 mg/dL (ref 0.3–1.2)
Total Protein: 5.6 g/dL — ABNORMAL LOW (ref 6.5–8.1)

## 2022-11-15 LAB — CBC
HCT: 30.9 % — ABNORMAL LOW (ref 36.0–46.0)
Hemoglobin: 10.3 g/dL — ABNORMAL LOW (ref 12.0–15.0)
MCH: 31 pg (ref 26.0–34.0)
MCHC: 33.3 g/dL (ref 30.0–36.0)
MCV: 93.1 fL (ref 80.0–100.0)
Platelets: 304 10*3/uL (ref 150–400)
RBC: 3.32 MIL/uL — ABNORMAL LOW (ref 3.87–5.11)
RDW: 12.6 % (ref 11.5–15.5)
WBC: 11.1 10*3/uL — ABNORMAL HIGH (ref 4.0–10.5)
nRBC: 0 % (ref 0.0–0.2)

## 2022-11-15 LAB — MAGNESIUM: Magnesium: 2.1 mg/dL (ref 1.7–2.4)

## 2022-11-15 LAB — TROPONIN I (HIGH SENSITIVITY)
Troponin I (High Sensitivity): 108 ng/L (ref <18)
Troponin I (High Sensitivity): 120 ng/L (ref <18)
Troponin I (High Sensitivity): 123 ng/L (ref <18)
Troponin I (High Sensitivity): 85 ng/L — ABNORMAL HIGH (ref <18)
Troponin I (High Sensitivity): 80 ng/L — ABNORMAL HIGH (ref <18)

## 2022-11-15 LAB — ECHOCARDIOGRAM COMPLETE
Area-P 1/2: 3.65 cm2
Height: 60 in
S' Lateral: 2.7 cm
Weight: 2321 oz

## 2022-11-15 LAB — PREALBUMIN: Prealbumin: 20 mg/dL (ref 18–38)

## 2022-11-15 LAB — FOLATE: Folate: 35.7 ng/mL (ref >5.9)

## 2022-11-15 LAB — MRSA NEXT GEN BY PCR, NASAL: MRSA by PCR Next Gen: NOT DETECTED

## 2022-11-15 LAB — PHOSPHORUS: Phosphorus: 3.4 mg/dL (ref 2.5–4.6)

## 2022-11-15 MED ORDER — CARVEDILOL 3.125 MG PO TABS
3.1250 mg | ORAL_TABLET | Freq: Two times a day (BID) | ORAL | 1 refills | Status: DC
  Filled 2022-11-15: qty 30, 15d supply, fill #0

## 2022-11-15 MED ORDER — ORAL CARE MOUTH RINSE: 15.0000 mL | OROMUCOSAL | Status: DC | PRN

## 2022-11-15 MED ORDER — ALUM & MAG HYDROXIDE-SIMETH 200-200-20 MG/5ML PO SUSP
30.0000 mL | ORAL | Status: DC | PRN
  Administered 2022-11-15: 30 mL via ORAL
  Filled 2022-11-15: qty 30

## 2022-11-15 NOTE — Discharge Summary (Signed)
[address] [place], [place] [address]  MRSA Next Gen by PCR, Nasal     Status: None   Collection Time: 11/14/22 11:22 PM   Specimen: Nasal Mucosa; Nasal Swab  Result Value Ref Range Status   MRSA by PCR Next Gen NOT DETECTED NOT DETECTED Final    Comment: (NOTE) The GeneXpert MRSA Assay (FDA approved for NASAL specimens only), is one component of a comprehensive MRSA colonization surveillance program. It is not intended to diagnose MRSA infection nor to guide or monitor treatment for MRSA infections. Test performance is not FDA approved in patients less than 57 years old. Performed at [facility], [address], [place], [place] [address]     Procedures/Studies: ECHOCARDIOGRAM COMPLETE  Result Date: 11/15/2022    ECHOCARDIOGRAM REPORT   Patient Name:   Cindy Simpson Date of Exam: 11/15/2022 Medical Rec #:  [id number]       Height:       60.0 in Accession #:    [id number]      Weight:       145.1 lb Date of Birth:  04/27/1946      BSA:          1.628 m Patient Age:    76 years        BP:           124/49 mmHg Patient Gender: F               HR:           76 bpm. Exam Location:  Inpatient Procedure: 2D Echo, Cardiac Doppler and Color Doppler Indications:    syncope  History:        Patient has prior history of Echocardiogram examinations, most                 recent 03/07/2015. Arrythmias:V-Tach; Risk Factors:Hypertension                 and Dyslipidemia.  Sonographer:    Delcie Roch RDCS Referring Phys: [id number] ANASTASSIA DOUTOVA IMPRESSIONS  1. Left ventricular ejection fraction, by estimation, is 60 to 65%. The left ventricle has normal function. The left ventricle has no regional wall motion abnormalities. Left  ventricular diastolic parameters were normal.  2. Right ventricular systolic function is normal. The right ventricular size is normal. There is normal pulmonary artery systolic pressure.  3. The mitral valve is normal in structure. Trivial mitral valve regurgitation. No evidence of mitral stenosis.  4. The aortic valve is tricuspid. Aortic valve regurgitation is not visualized. Aortic valve sclerosis is present, with no evidence of aortic valve stenosis.  5. The inferior vena cava is normal in size with greater than 50% respiratory variability, suggesting right atrial pressure of 3 mmHg. FINDINGS  Left Ventricle: Left ventricular ejection fraction, by estimation, is 60 to 65%. The left ventricle has normal function. The left ventricle has no regional wall motion abnormalities. The left ventricular internal cavity size was normal in size. There is  no left ventricular hypertrophy. Left ventricular diastolic parameters were normal. Right Ventricle: The right ventricular size is normal. Right ventricular systolic function is normal. There is normal pulmonary artery systolic pressure. The tricuspid regurgitant velocity is 2.71 m/s, and with an assumed right atrial pressure of 3 mmHg,  the estimated right ventricular systolic pressure is 32.4 mmHg. Left Atrium: Left atrial size was normal in size. Right Atrium: Right atrial size was normal in size. Pericardium: There is no evidence of pericardial  [address] [place], [place] [address]  MRSA Next Gen by PCR, Nasal     Status: None   Collection Time: 11/14/22 11:22 PM   Specimen: Nasal Mucosa; Nasal Swab  Result Value Ref Range Status   MRSA by PCR Next Gen NOT DETECTED NOT DETECTED Final    Comment: (NOTE) The GeneXpert MRSA Assay (FDA approved for NASAL specimens only), is one component of a comprehensive MRSA colonization surveillance program. It is not intended to diagnose MRSA infection nor to guide or monitor treatment for MRSA infections. Test performance is not FDA approved in patients less than 57 years old. Performed at [facility], [address], [place], [place] [address]     Procedures/Studies: ECHOCARDIOGRAM COMPLETE  Result Date: 11/15/2022    ECHOCARDIOGRAM REPORT   Patient Name:   Cindy Simpson Date of Exam: 11/15/2022 Medical Rec #:  [id number]       Height:       60.0 in Accession #:    [id number]      Weight:       145.1 lb Date of Birth:  04/27/1946      BSA:          1.628 m Patient Age:    76 years        BP:           124/49 mmHg Patient Gender: F               HR:           76 bpm. Exam Location:  Inpatient Procedure: 2D Echo, Cardiac Doppler and Color Doppler Indications:    syncope  History:        Patient has prior history of Echocardiogram examinations, most                 recent 03/07/2015. Arrythmias:V-Tach; Risk Factors:Hypertension                 and Dyslipidemia.  Sonographer:    Delcie Roch RDCS Referring Phys: [id number] ANASTASSIA DOUTOVA IMPRESSIONS  1. Left ventricular ejection fraction, by estimation, is 60 to 65%. The left ventricle has normal function. The left ventricle has no regional wall motion abnormalities. Left  ventricular diastolic parameters were normal.  2. Right ventricular systolic function is normal. The right ventricular size is normal. There is normal pulmonary artery systolic pressure.  3. The mitral valve is normal in structure. Trivial mitral valve regurgitation. No evidence of mitral stenosis.  4. The aortic valve is tricuspid. Aortic valve regurgitation is not visualized. Aortic valve sclerosis is present, with no evidence of aortic valve stenosis.  5. The inferior vena cava is normal in size with greater than 50% respiratory variability, suggesting right atrial pressure of 3 mmHg. FINDINGS  Left Ventricle: Left ventricular ejection fraction, by estimation, is 60 to 65%. The left ventricle has normal function. The left ventricle has no regional wall motion abnormalities. The left ventricular internal cavity size was normal in size. There is  no left ventricular hypertrophy. Left ventricular diastolic parameters were normal. Right Ventricle: The right ventricular size is normal. Right ventricular systolic function is normal. There is normal pulmonary artery systolic pressure. The tricuspid regurgitant velocity is 2.71 m/s, and with an assumed right atrial pressure of 3 mmHg,  the estimated right ventricular systolic pressure is 32.4 mmHg. Left Atrium: Left atrial size was normal in size. Right Atrium: Right atrial size was normal in size. Pericardium: There is no evidence of pericardial  Physician Discharge Summary  GRANT HENKES ZOX:[id number] DOB: [birth year] DOA: 11/14/2022  PCP: Tresa Garter, MD  Admit date: 11/14/2022 Discharge date: 11/15/2022 Recommendations for Outpatient Follow-up:  Follow up with PCP in 1 weeks-call for appointment Please obtain BMP/CBC in one week  Discharge Dispo: home Discharge Condition: Stable Code Status:   Code Status: Full Code Diet recommendation:  Diet Order             Diet regular Room service appropriate? Yes; Fluid consistency: Thin  Diet effective now                    Brief/Interim Summary:  77 y.o. female with medical history significant of HTN HLD CAD hypothyroidism, presented with presyncopal event upon standing. She was in the kitchen tried to stand up and became diaphoretic and weak and had to sit down. For EMS CBG was 75 she got a cup of juice and it went up to 143 in the way to the hospital patient had a run of V. tach on event monitor but then returned sinus She has history of palpitation and some sort of rhythm abnormality nd is on verpamil Husband says she has been working  outside for the past 3 days She used to have a glucometer to check her BG bc of frequent sugar crushes but never learned how to use it Patient was admitted for further management.  No evidence of V. tach, seen by cardiology worked with PT OT negative orthostatics. Echo showed EF 60 to 65%, no RWMA normal pulmonary artery systolic pressure aortic valve is tricuspid mitral valve is normal. Overall feeling much better.  Initially Dr. Ane Payment who plan for 4 weeks of cardiac monitoring as outpatient and outpatient follow-up already arranged, possible perfusion study as outpatient. 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 Bp meds held-continue to hold and discussed with PCP and cardiology as outpatient Recent Labs    01/12/22 0927 09/29/22 1001 11/14/22 1625 11/14/22 2210 11/15/22 0013  BUN 22 15 28* 23 21  CREATININE 1.11 0.96 1.20* 0.98 0.95    Mild hyponatremia due to dehydration stable encourage oral intake Presyncope Episode of ? V. tach during transport: Seen by cardiology currently no VT noted , artifact noted, echo ordered by cardiology-unremarkable currently orthostatic vitals are negative did well with PT. outpatient cardiac monitoring is being arranged  Elevated troponin: 94>> 123> 85, follow-up echo unremarkable, continue aspirin, Lipitor> possible perfusion study as outpatient History of Takotsubo cardiomyopathy with normal EF most recently cardiac cath in 2017 with minimal coronary plaque Dyslipidemia continue statin Hypertension allow blood pressure to run high, holding meds.  Orthostatic vitals negative.  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 Yes; Fluid consistency: Thin  Diet effective now                    Brief/Interim Summary:  77 y.o. female with medical history significant of HTN HLD CAD hypothyroidism, presented with presyncopal event upon standing. She was in the kitchen tried to stand up and became diaphoretic and weak and had to sit down. For EMS CBG was 75 she got a cup of juice and it went up to 143 in the way to the hospital patient had a run of V. tach on event monitor but then returned sinus She has history of palpitation and some sort of rhythm abnormality nd is on verpamil Husband says she has been working  outside for the past 3 days She used to have a glucometer to check her BG bc of frequent sugar crushes but never learned how to use it Patient was admitted for further management.  No evidence of V. tach, seen by cardiology worked with PT OT negative orthostatics. Echo showed EF 60 to 65%, no RWMA normal pulmonary artery systolic pressure aortic valve is tricuspid mitral valve is normal. Overall feeling much better.  Initially Dr. Ane Payment who plan for 4 weeks of cardiac monitoring as outpatient and outpatient follow-up already arranged, possible perfusion study as outpatient. 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 Bp meds held-continue to hold and discussed with PCP and cardiology as outpatient Recent Labs    01/12/22 0927 09/29/22 1001 11/14/22 1625 11/14/22 2210 11/15/22 0013  BUN 22 15 28* 23 21  CREATININE 1.11 0.96 1.20* 0.98 0.95    Mild hyponatremia due to dehydration stable encourage oral intake Presyncope Episode of ? V. tach during transport: Seen by cardiology currently no VT noted , artifact noted, echo ordered by cardiology-unremarkable currently orthostatic vitals are negative did well with PT. outpatient cardiac monitoring is being arranged  Elevated troponin: 94>> 123> 85, follow-up echo unremarkable, continue aspirin, Lipitor> possible perfusion study as outpatient History of Takotsubo cardiomyopathy with normal EF most recently cardiac cath in 2017 with minimal coronary plaque Dyslipidemia continue statin Hypertension allow blood pressure to run high, holding meds.  Orthostatic vitals negative.  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 It is not intended to diagnose MRSA infection nor to guide or monitor treatment for MRSA infections. Test performance is not FDA approved in patients less than 57 years old. Performed at [facility], [address], [place], [place] [address]     Procedures/Studies: ECHOCARDIOGRAM COMPLETE  Result Date: 11/15/2022    ECHOCARDIOGRAM REPORT   Patient Name:   Cindy Simpson Date of Exam: 11/15/2022 Medical Rec #:  [id number]       Height:       60.0 in Accession #:    [id number]      Weight:       145.1 lb Date of Birth:  04/27/1946      BSA:          1.628 m Patient Age:    76 years        BP:           124/49 mmHg Patient Gender: F               HR:           76 bpm. Exam Location:  Inpatient Procedure: 2D Echo, Cardiac Doppler and Color Doppler Indications:    syncope  History:        Patient has prior history of Echocardiogram examinations, most                 recent 03/07/2015. Arrythmias:V-Tach; Risk Factors:Hypertension                 and Dyslipidemia.  Sonographer:    Delcie Roch RDCS Referring Phys: [id number] ANASTASSIA DOUTOVA IMPRESSIONS  1. Left ventricular ejection fraction, by estimation, is 60 to 65%. The left ventricle has normal function. The left ventricle has no regional wall motion abnormalities. Left  ventricular diastolic parameters were normal.  2. Right ventricular systolic function is normal. The right ventricular size is normal. There is normal pulmonary artery systolic pressure.  3. The mitral valve is normal in structure. Trivial mitral valve regurgitation. No evidence of mitral stenosis.  4. The aortic valve is tricuspid. Aortic valve regurgitation is not visualized. Aortic valve sclerosis is present, with no evidence of aortic valve stenosis.  5. The inferior vena cava is normal in size with greater than 50% respiratory variability, suggesting right atrial pressure of 3 mmHg. FINDINGS  Left Ventricle: Left ventricular ejection fraction, by estimation, is 60 to 65%. The left ventricle has normal function. The left ventricle has no regional wall motion abnormalities. The left ventricular internal cavity size was normal in size. There is  no left ventricular hypertrophy. Left ventricular diastolic parameters were normal. Right Ventricle: The right ventricular size is normal. Right ventricular systolic function is normal. There is normal pulmonary artery systolic pressure. The tricuspid regurgitant velocity is 2.71 m/s, and with an assumed right atrial pressure of 3 mmHg,  the estimated right ventricular systolic pressure is 32.4 mmHg. Left Atrium: Left atrial size was normal in size. Right Atrium: Right atrial size was normal in size. Pericardium: There is no evidence of pericardial  [address] [place], [place] [address]  MRSA Next Gen by PCR, Nasal     Status: None   Collection Time: 11/14/22 11:22 PM   Specimen: Nasal Mucosa; Nasal Swab  Result Value Ref Range Status   MRSA by PCR Next Gen NOT DETECTED NOT DETECTED Final    Comment: (NOTE) The GeneXpert MRSA Assay (FDA approved for NASAL specimens only), is one component of a comprehensive MRSA colonization surveillance program. It is not intended to diagnose MRSA infection nor to guide or monitor treatment for MRSA infections. Test performance is not FDA approved in patients less than 57 years old. Performed at [facility], [address], [place], [place] [address]     Procedures/Studies: ECHOCARDIOGRAM COMPLETE  Result Date: 11/15/2022    ECHOCARDIOGRAM REPORT   Patient Name:   Cindy Simpson Date of Exam: 11/15/2022 Medical Rec #:  [id number]       Height:       60.0 in Accession #:    [id number]      Weight:       145.1 lb Date of Birth:  04/27/1946      BSA:          1.628 m Patient Age:    76 years        BP:           124/49 mmHg Patient Gender: F               HR:           76 bpm. Exam Location:  Inpatient Procedure: 2D Echo, Cardiac Doppler and Color Doppler Indications:    syncope  History:        Patient has prior history of Echocardiogram examinations, most                 recent 03/07/2015. Arrythmias:V-Tach; Risk Factors:Hypertension                 and Dyslipidemia.  Sonographer:    Delcie Roch RDCS Referring Phys: [id number] ANASTASSIA DOUTOVA IMPRESSIONS  1. Left ventricular ejection fraction, by estimation, is 60 to 65%. The left ventricle has normal function. The left ventricle has no regional wall motion abnormalities. Left  ventricular diastolic parameters were normal.  2. Right ventricular systolic function is normal. The right ventricular size is normal. There is normal pulmonary artery systolic pressure.  3. The mitral valve is normal in structure. Trivial mitral valve regurgitation. No evidence of mitral stenosis.  4. The aortic valve is tricuspid. Aortic valve regurgitation is not visualized. Aortic valve sclerosis is present, with no evidence of aortic valve stenosis.  5. The inferior vena cava is normal in size with greater than 50% respiratory variability, suggesting right atrial pressure of 3 mmHg. FINDINGS  Left Ventricle: Left ventricular ejection fraction, by estimation, is 60 to 65%. The left ventricle has normal function. The left ventricle has no regional wall motion abnormalities. The left ventricular internal cavity size was normal in size. There is  no left ventricular hypertrophy. Left ventricular diastolic parameters were normal. Right Ventricle: The right ventricular size is normal. Right ventricular systolic function is normal. There is normal pulmonary artery systolic pressure. The tricuspid regurgitant velocity is 2.71 m/s, and with an assumed right atrial pressure of 3 mmHg,  the estimated right ventricular systolic pressure is 32.4 mmHg. Left Atrium: Left atrial size was normal in size. Right Atrium: Right atrial size was normal in size. Pericardium: There is no evidence of pericardial  Physician Discharge Summary  GRANT HENKES ZOX:[id number] DOB: [birth year] DOA: 11/14/2022  PCP: Tresa Garter, MD  Admit date: 11/14/2022 Discharge date: 11/15/2022 Recommendations for Outpatient Follow-up:  Follow up with PCP in 1 weeks-call for appointment Please obtain BMP/CBC in one week  Discharge Dispo: home Discharge Condition: Stable Code Status:   Code Status: Full Code Diet recommendation:  Diet Order             Diet regular Room service appropriate? Yes; Fluid consistency: Thin  Diet effective now                    Brief/Interim Summary:  77 y.o. female with medical history significant of HTN HLD CAD hypothyroidism, presented with presyncopal event upon standing. She was in the kitchen tried to stand up and became diaphoretic and weak and had to sit down. For EMS CBG was 75 she got a cup of juice and it went up to 143 in the way to the hospital patient had a run of V. tach on event monitor but then returned sinus She has history of palpitation and some sort of rhythm abnormality nd is on verpamil Husband says she has been working  outside for the past 3 days She used to have a glucometer to check her BG bc of frequent sugar crushes but never learned how to use it Patient was admitted for further management.  No evidence of V. tach, seen by cardiology worked with PT OT negative orthostatics. Echo showed EF 60 to 65%, no RWMA normal pulmonary artery systolic pressure aortic valve is tricuspid mitral valve is normal. Overall feeling much better.  Initially Dr. Ane Payment who plan for 4 weeks of cardiac monitoring as outpatient and outpatient follow-up already arranged, possible perfusion study as outpatient. 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 Bp meds held-continue to hold and discussed with PCP and cardiology as outpatient Recent Labs    01/12/22 0927 09/29/22 1001 11/14/22 1625 11/14/22 2210 11/15/22 0013  BUN 22 15 28* 23 21  CREATININE 1.11 0.96 1.20* 0.98 0.95    Mild hyponatremia due to dehydration stable encourage oral intake Presyncope Episode of ? V. tach during transport: Seen by cardiology currently no VT noted , artifact noted, echo ordered by cardiology-unremarkable currently orthostatic vitals are negative did well with PT. outpatient cardiac monitoring is being arranged  Elevated troponin: 94>> 123> 85, follow-up echo unremarkable, continue aspirin, Lipitor> possible perfusion study as outpatient History of Takotsubo cardiomyopathy with normal EF most recently cardiac cath in 2017 with minimal coronary plaque Dyslipidemia continue statin Hypertension allow blood pressure to run high, holding meds.  Orthostatic vitals negative.  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

## 2022-11-15 NOTE — Evaluation (Signed)
Physical Therapy Evaluation Patient Details Name: Cindy Simpson MRN: [id number] DOB: 05-13-1946 Today's Date: 11/15/2022  History of Present Illness  77 yo female admitted 4/28 with near syncope and run of Vtach with EMS. PMhx: HTN, HLD, IBS, insomnia  Clinical Impression  Pt with no dizziness or presyncope during session with HR 71 and SPO2 100%. Pt normally independent, gardening and active. Pt able to get OOB and walk without physical assist but reliant on rail for stairs which is not her baseline. Pt reports fatigue from being in bed with increased mobility and up to bathroom rather than pure wick use recommended. Pt with slight decrease in activity tolerance and gait from baseline who will benefit from acute therapy to maximize independence without anticipation of post acute rehab needs.        Recommendations for follow up therapy are one component of a multi-disciplinary discharge planning process, led by the attending physician.  Recommendations may be updated based on patient status, additional functional criteria and insurance authorization.  Follow Up Recommendations       Assistance Recommended at Discharge PRN  Patient can return home with the following       Equipment Recommendations None recommended by PT  Recommendations for Other Services       Functional Status Assessment Patient has had a recent decline in their functional status and demonstrates the ability to make significant improvements in function in a reasonable and predictable amount of time.     Precautions / Restrictions Precautions Precautions: Fall      Mobility  Bed Mobility Overal bed mobility: Modified Independent             General bed mobility comments: increased time with rail, HOB 10 degrees    Transfers Overall transfer level: Modified independent                 General transfer comment: pt able to stand from bed and toilet without assist     Ambulation/Gait Ambulation/Gait assistance: Supervision Gait Distance (Feet): 350 Feet Assistive device: None Gait Pattern/deviations: Step-through pattern, Decreased stride length   Gait velocity interpretation: >2.62 ft/sec, indicative of community ambulatory   General Gait Details: pt with altered gait due to leg length discrepancy, no need for AD and pt reports no interest in shoe lift  Stairs Stairs: Yes Stairs assistance: Modified independent (Device/Increase time) Stair Management: Step to pattern, Forwards, One rail Left Number of Stairs: 2 General stair comments: pt with reliance on rail to ascend stairs and reports she normally can even carry items up the stairs. Educated for benefit of grab bar or railing installation at home  Wheelchair Mobility    Modified Rankin (Stroke Patients Only)       Balance Overall balance assessment: Mild deficits observed, not formally tested                                           Pertinent Vitals/Pain Pain Assessment Pain Assessment: No/denies pain    Home Living Family/patient expects to be discharged to:: Private residence Living Arrangements: Spouse/significant other Available Help at Discharge: Family;Available 24 hours/day Type of Home: House Home Access: Stairs to enter Entrance Stairs-Rails: None Entrance Stairs-Number of Steps: 2 from garage   Home Layout: One level Home Equipment: Grab bars - tub/shower;Shower seat      Prior Function Prior Level of Function : Independent/Modified Independent  Hand Dominance        Extremity/Trunk Assessment   Upper Extremity Assessment Upper Extremity Assessment: Generalized weakness    Lower Extremity Assessment Lower Extremity Assessment: Generalized weakness (leg length discrepancy)    Cervical / Trunk Assessment Cervical / Trunk Assessment: Normal  Communication   Communication: No difficulties  Cognition  Arousal/Alertness: Awake/alert Behavior During Therapy: WFL for tasks assessed/performed Overall Cognitive Status: Within Functional Limits for tasks assessed                                          General Comments      Exercises     Assessment/Plan    PT Assessment Patient needs continued PT services  PT Problem List Decreased activity tolerance;Decreased balance;Decreased mobility       PT Treatment Interventions DME instruction;Therapeutic exercise;Gait training;Stair training;Functional mobility training;Therapeutic activities;Patient/family education    PT Goals (Current goals can be found in the Care Plan section)  Acute Rehab PT Goals Patient Stated Goal: return home and to gardening PT Goal Formulation: With patient Time For Goal Achievement: 11/29/22 Potential to Achieve Goals: Good    Frequency Min 1X/week     Co-evaluation               AM-PAC PT "6 Clicks" Mobility  Outcome Measure Help needed turning from your back to your side while in a flat bed without using bedrails?: None Help needed moving from lying on your back to sitting on the side of a flat bed without using bedrails?: A Little Help needed moving to and from a bed to a chair (including a wheelchair)?: A Little Help needed standing up from a chair using your arms (e.g., wheelchair or bedside chair)?: A Little Help needed to walk in hospital room?: A Little Help needed climbing 3-5 steps with a railing? : None 6 Click Score: 20    End of Session   Activity Tolerance: Patient tolerated treatment well Patient left: in chair;with call bell/phone within reach Nurse Communication: Mobility status PT Visit Diagnosis: Other abnormalities of gait and mobility (R26.89)    Time: 4259-5638 PT Time Calculation (min) (ACUTE ONLY): 22 min   Charges:   PT Evaluation $PT Eval Low Complexity: 1 Low          Verlaine Embry P, PT Acute Rehabilitation Services Office:  [phone]   Enedina Finner Ladrea Holladay 11/15/2022, 12:51 PM

## 2022-11-15 NOTE — Assessment & Plan Note (Signed)
Will continue to trend pt does not endorse any CP No sig ECg changes  Echo in am Cardiology is aware of the pt

## 2022-11-15 NOTE — Progress Notes (Signed)
Progress Note  Patient Name: Cindy Simpson Date of Encounter: 11/15/2022  Primary Cardiologist:   None   Subjective   Feels OK this AM.  No chest pain.  No SOB.  No palpitations.   Inpatient Medications    Scheduled Meds:  aspirin EC  81 mg Oral Daily   atorvastatin  20 mg Oral Daily   levothyroxine  100 mcg Oral Daily   Continuous Infusions:  lactated ringers 100 mL/hr at 11/15/22 0600   PRN Meds: acetaminophen **OR** acetaminophen, HYDROcodone-acetaminophen, ondansetron **OR** ondansetron (ZOFRAN) IV, mouth rinse   Vital Signs    Vitals:   11/15/22 0346 11/15/22 0400 11/15/22 0500 11/15/22 0600  BP: 109/62 107/66 107/62 111/61  Pulse: 68 77 71 66  Resp: 17 12 17 10   Temp: 98.2 F (36.8 C)     TempSrc: Oral     SpO2: 96% 98% 93% 96%  Weight:      Height:        Intake/Output Summary (Last 24 hours) at 11/15/2022 0733 Last data filed at 11/15/2022 0600 Gross per 24 hour  Intake 989.94 ml  Output 250 ml  Net 739.94 ml   Filed Weights   11/14/22 2229  Weight: 65.8 kg    Telemetry    NSR - Personally Reviewed  ECG    NA - Personally Reviewed  Physical Exam   GEN: No acute distress.   Neck: No  JVD Cardiac: RRR, soft apical systolic murmur, no diastolic murmurs, rubs, or gallops.  Respiratory: Clear  to auscultation bilaterally. GI: Soft, nontender, non-distended  MS: No  edema; No deformity. Neuro:  Nonfocal  Psych: Normal affect   Labs    Chemistry Recent Labs  Lab 11/14/22 1625 11/14/22 2210 11/15/22 0013  NA 131* 132* 132*  K 4.3 4.0 4.5  CL 99 98 99  CO2 22 23 23   GLUCOSE 105* 115* 111*  BUN 28* 23 21  CREATININE 1.20* 0.98 0.95  CALCIUM 8.6* 8.7* 8.8*  PROT 6.0*  --  5.6*  ALBUMIN 3.6  --  3.3*  AST 28  --  26  ALT 17  --  15  ALKPHOS 89  --  87  BILITOT 0.4  --  0.9  GFRNONAA 47* 59* >60  ANIONGAP 10 11 10      Hematology Recent Labs  Lab 11/14/22 1625 11/14/22 2210 11/15/22 0013  WBC 11.0* 10.6* 11.1*  RBC  3.45* 3.37*  3.38* 3.32*  HGB 10.6* 10.6* 10.3*  HCT 33.7* 31.8* 30.9*  MCV 97.7 94.4 93.1  MCH 30.7 31.5 31.0  MCHC 31.5 33.3 33.3  RDW 12.7 12.6 12.6  PLT 276 312 304    Cardiac EnzymesNo results for input(s): "TROPONINI" in the last 168 hours. No results for input(s): "TROPIPOC" in the last 168 hours.   BNPNo results for input(s): "BNP", "PROBNP" in the last 168 hours.   DDimer No results for input(s): "DDIMER" in the last 168 hours.   Radiology    CT Angio Chest PE W and/or Wo Contrast  Result Date: 11/14/2022 CLINICAL DATA:  Weakness EXAM: CT ANGIOGRAPHY CHEST WITH CONTRAST TECHNIQUE: Multidetector CT imaging of the chest was performed using the standard protocol during bolus administration of intravenous contrast. Multiplanar CT image reconstructions and MIPs were obtained to evaluate the vascular anatomy. RADIATION DOSE REDUCTION: This exam was performed according to the departmental dose-optimization program which includes automated exposure control, adjustment of the mA and/or kV according to patient size and/or use of iterative reconstruction  technique. CONTRAST:  75mL OMNIPAQUE IOHEXOL 350 MG/ML SOLN COMPARISON:  CT 07/06/2018, 03/28/2019 FINDINGS: Cardiovascular: Satisfactory opacification of the pulmonary arteries to the segmental level. No evidence of pulmonary embolism. Mild aortic atherosclerosis. No aneurysm. Mild cardiomegaly. No pericardial effusion. Mediastinum/Nodes: Midline trachea. No thyroid mass. No suspicious lymph nodes. Moderate hiatal hernia. Lungs/Pleura: No consolidation or effusion. Diffuse bilateral mosaic attenuation. Upper Abdomen: No acute finding. Increased size of a cystic lesion in the upper pole of the right kidney, now measuring 6 cm with thin linear areas of enhancement, possibly enhancing septa. Musculoskeletal: No acute or suspicious osseous abnormality. Review of the MIP images confirms the above findings. IMPRESSION: 1. Negative for acute pulmonary  embolus. 2. Diffuse bilateral mosaic attenuation of the lungs, could be secondary to small vessel disease or small airways disease. 3. Increased size of a cystic lesion in the upper pole of the right kidney, now measuring 6 cm with thin linear areas of enhancement, possibly enhancing septa. When the patient is clinically stable and able to follow directions and hold their breath (preferably as an outpatient) further evaluation with dedicated abdominal MRI should be considered. 4. Aortic atherosclerosis. Aortic Atherosclerosis (ICD10-I70.0). Electronically Signed   By: Jasmine Pang M.D.   On: 11/14/2022 19:40   DG Chest Port 1 View  Result Date: 11/14/2022 CLINICAL DATA:  Weakness. EXAM: PORTABLE CHEST 1 VIEW COMPARISON:  None Available. FINDINGS: The heart size and mediastinal contours are within normal limits. Both lungs are clear. The visualized skeletal structures are unremarkable. IMPRESSION: No active disease. Electronically Signed   By: Gerome Sam III M.D.   On: 11/14/2022 17:02    Cardiac Studies   Echo pending.    Patient Profile     77 y.o. female with a hx of stress induced cardiomyopathy in 2017, hyperlipidemia, hypothyroidism, hypertension, GERD and IBS who is being seen 11/14/2022 for the evaluation of ventricular tachycardia  Assessment & Plan    Possible arrhythmia:  I reviewed the rhythm strips from yesterday at the time of the consult.  No ventricular tachycardia.  Artifact noted. Echo pending.    Elevated troponin:   Pending the echo, I will likely plan an out patient perfusion study.       History of Takotsubo:  Most recent EF normal.  Cath 2017 with minimal coronary plaque.   Presnycope:   Hypotensive in the ED.  Not orthostatic later during the ED stay.  Hydrated .  Likely no further work up other than as above.   Dyslipidemia:  Continue Lipitor in the face of aortic atherosclerosis and very mild CAD years ago.    HTN:  Allow for some hypertension while she  recovers from acute events.    AKI:  Mild.  Follow creat with hydration.   Hyponatremia:  Per primary team.     For questions or updates, please contact [facility] Please consult [email or web address] for contact info under Cardiology/STEMI.   Signed, Rollene Rotunda, MD  11/15/2022, 7:33 AM

## 2022-11-15 NOTE — Progress Notes (Signed)
Nutrition Brief Note  New consult received for assessment of nutrition needs.   Visited pt at bedside, eating lunch at the time of assessment. Pt reports that she has a very good appetite at baseline (per pt it is "too good"). Weight is stable and she stays active by working in her garden at baseline. Mild muscle deficits identified on exam but likely more consistent with normal aging. Therapy recommended outpatient PT at discharge. Pt will likely be able to meet her nutrition needs through diet alone without the need for nutrition supplements.    Wt Readings from Last 15 Encounters:  11/14/22 65.8 kg  09/29/22 65.8 kg  08/26/22 63.5 kg  06/21/22 65.3 kg  01/12/22 64.9 kg  10/12/21 68 kg  12/30/20 68.8 kg  08/21/20 69.3 kg  05/09/20 68.9 kg  04/30/20 69.5 kg  01/28/20 68.5 kg  07/30/19 70.8 kg  04/04/19 70.3 kg  03/28/19 70.8 kg  01/24/19 70.8 kg   Body mass index is 28.33 kg/m. Patient BMI in desirable range for advanced age.   Current diet order is regular. Labs and medications reviewed.   No nutrition interventions warranted at this time. If nutrition issues arise, please consult RD.   Greig Castilla, RD, LDN Clinical Dietitian RD pager # available in AMION  After hours/weekend pager # available in [facility]

## 2022-11-15 NOTE — Evaluation (Signed)
Occupational Therapy Evaluation Patient Details Name: Cindy Simpson MRN: [id number] DOB: Apr 25, 1946 Today's Date: 11/15/2022   History of Present Illness 77 yo female admitted 4/28 with near syncope and run of Vtach with EMS. PMhx: HTN, HLD, IBS, insomnia   Clinical Impression   Pt reports independence at baseline with ADLs and functional mobility, lives with spouse who can assist at d/c. Pt very active, enjoys working in her garden. Pt needing set up -min A for ADLs, mod I for bed mobility, and min guard for transfers without AD. Pt reports feeling " a little weaker" than normal, but denies dizziness/lightheadedness during mobility. Pt presenting with impairments listed below, will follow acutely. Recommend HHOT at d/c pending progression.   BP supine 108/52 (67) BP seated 118/63 (80) BP standing 112/94 (101)      Recommendations for follow up therapy are one component of a multi-disciplinary discharge planning process, led by the attending physician.  Recommendations may be updated based on patient status, additional functional criteria and insurance authorization.   Assistance Recommended at Discharge Set up Supervision/Assistance  Patient can return home with the following A little help with walking and/or transfers;A little help with bathing/dressing/bathroom;Assist for transportation;Help with stairs or ramp for entrance    Functional Status Assessment  Patient has had a recent decline in their functional status and demonstrates the ability to make significant improvements in function in a reasonable and predictable amount of time.  Equipment Recommendations  None recommended by OT (pt has all needed DME)    Recommendations for Other Services PT consult     Precautions / Restrictions Precautions Precautions: Fall Restrictions Weight Bearing Restrictions: No      Mobility Bed Mobility Overal bed mobility: Modified Independent                   Transfers Overall transfer level: Needs assistance Equipment used: None Transfers: Sit to/from Stand Sit to Stand: Min guard                  Balance Overall balance assessment: Mild deficits observed, not formally tested                                         ADL either performed or assessed with clinical judgement   ADL Overall ADL's : Needs assistance/impaired Eating/Feeding: Set up;Sitting   Grooming: Set up;Sitting;Standing   Upper Body Bathing: Minimal assistance;Sitting   Lower Body Bathing: Minimal assistance;Sitting/lateral leans   Upper Body Dressing : Minimal assistance;Sitting   Lower Body Dressing: Minimal assistance;Sitting/lateral leans   Toilet Transfer: Ambulation;Regular Toilet;Min guard   Toileting- Clothing Manipulation and Hygiene: Min guard       Functional mobility during ADLs: Min guard       Vision   Vision Assessment?: No apparent visual deficits     Perception Perception Perception Tested?: No   Praxis Praxis Praxis tested?: Not tested    Pertinent Vitals/Pain Pain Assessment Pain Assessment: Faces Pain Score: 4  Faces Pain Scale: Hurts little more Pain Location: L hand from IV Pain Descriptors / Indicators: Discomfort Pain Intervention(s): Limited activity within patient's tolerance, Monitored during session, Repositioned     Hand Dominance Right   Extremity/Trunk Assessment Upper Extremity Assessment Upper Extremity Assessment: Generalized weakness   Lower Extremity Assessment Lower Extremity Assessment: Defer to PT evaluation   Cervical / Trunk Assessment Cervical / Trunk Assessment: Normal  Communication Communication Communication: No difficulties   Cognition Arousal/Alertness: Awake/alert Behavior During Therapy: WFL for tasks assessed/performed Overall Cognitive Status: Within Functional Limits for tasks assessed                                       General  Comments  VSS on RA, orthostatic vitals taken, see note    Exercises     Shoulder Instructions      Home Living Family/patient expects to be discharged to:: Private residence Living Arrangements: Spouse/significant other Available Help at Discharge: Family;Available 24 hours/day Type of Home: House Home Access: Stairs to enter [employer] of Steps: 2 from garage   Home Layout: One level     Bathroom Shower/Tub: Walk-in shower         Home Equipment: Grab bars - tub/shower;Shower seat          Prior Functioning/Environment Prior Level of Function : Independent/Modified Independent             Mobility Comments: no AD ADLs Comments: reports working in the yard and has been helping her sister at times        OT Problem List: Decreased strength;Decreased range of motion;Decreased activity tolerance;Impaired balance (sitting and/or standing)      OT Treatment/Interventions: Therapeutic exercise;Self-care/ADL training;Energy conservation;DME and/or AE instruction    OT Goals(Current goals can be found in the care plan section) Acute Rehab OT Goals Patient Stated Goal: none stated OT Goal Formulation: With patient Time For Goal Achievement: 11/29/22 Potential to Achieve Goals: Good ADL Goals Pt Will Perform Upper Body Dressing: Independently;sitting;standing Pt Will Perform Lower Body Dressing: Independently;sitting/lateral leans;sit to/from stand Pt Will Transfer to Toilet: Independently;ambulating;regular height toilet Pt Will Perform Tub/Shower Transfer: Tub transfer;Shower transfer;Independently;ambulating;shower seat  OT Frequency: Min 2X/week    Co-evaluation              AM-PAC OT "6 Clicks" Daily Activity     Outcome Measure Help from another person eating meals?: None Help from another person taking care of personal grooming?: A Little Help from another person toileting, which includes using toliet, bedpan, or urinal?: A Little Help  from another person bathing (including washing, rinsing, drying)?: A Little Help from another person to put on and taking off regular upper body clothing?: A Little Help from another person to put on and taking off regular lower body clothing?: A Little 6 Click Score: 19   End of Session Equipment Utilized During Treatment: Gait belt Nurse Communication: Mobility status  Activity Tolerance: Patient tolerated treatment well Patient left: with call bell/phone within reach;in chair  OT Visit Diagnosis: Unsteadiness on feet (R26.81);Other abnormalities of gait and mobility (R26.89);Muscle weakness (generalized) (M62.81)                Time: 3086-5784 OT Time Calculation (min): 21 min Charges:  OT General Charges $OT Visit: 1 Visit OT Evaluation $OT Eval Moderate Complexity: 1 Mod  Kristi Hyer K, OTD, OTR/L SecureChat Preferred Acute Rehab [phone]   Carver Fila Koonce 11/15/2022, 9:21 AM

## 2022-11-15 NOTE — Progress Notes (Signed)
30 days event monitor ordered , Dr Antoine Poche to see as new patient 12/23/22. Office staff will coordinate monitor application. Hypotension resolved, BP 124/49 - 145/57. Advised hospitalist to hold valsartan, reduce coreg 3.125mg  BID upon discharge. Advised the patient to check BP daily, bring logs to follow up appointment for further adjustment if needed. AVS updated.

## 2022-11-15 NOTE — Progress Notes (Signed)
  Echocardiogram 2D Echocardiogram has been performed.  Delcie Roch 11/15/2022, 10:43 AM

## 2022-11-15 NOTE — Hospital Course (Addendum)
77 y.o. female with medical history significant of HTN HLD CAD hypothyroidism, presented with presyncopal event upon standing. She was in the kitchen tried to stand up and became diaphoretic and weak and had to sit down. For EMS CBG was 75 she got a cup of juice and it went up to 143 in the way to the hospital patient had a run of V. tach on event monitor but then returned sinus She has history of palpitation and some sort of rhythm abnormality nd is on verpamil Husband says she has been working  outside for the past 3 days She used to have a glucometer to check her BG bc of frequent sugar crushes but never learned how to use it Patient was admitted for further management.  No evidence of V. tach, seen by cardiology worked with PT OT negative orthostatics. Echo showed EF 60 to 65%, no RWMA normal pulmonary artery systolic pressure aortic valve is tricuspid mitral valve is normal. Overall feeling much better.  Initially Dr. Ane Payment who plan for 4 weeks of cardiac monitoring as outpatient and outpatient follow-up already arranged, possible perfusion study as outpatient. Seen by PT OT and no further need, patient wants to go home, she is a primary caregiver for her husband at home who has dementia and is a primary caregiver

## 2022-11-16 LAB — CULTURE, BLOOD (ROUTINE X 2)

## 2022-11-17 LAB — CULTURE, BLOOD (ROUTINE X 2): Culture: NO GROWTH

## 2022-11-19 LAB — CULTURE, BLOOD (ROUTINE X 2): Culture: NO GROWTH

## 2022-11-24 ENCOUNTER — Ambulatory Visit (INDEPENDENT_AMBULATORY_CARE_PROVIDER_SITE_OTHER): Payer: Medicare Other | Admitting: Internal Medicine

## 2022-11-24 ENCOUNTER — Other Ambulatory Visit: Payer: Self-pay | Admitting: Home Health

## 2022-11-24 ENCOUNTER — Encounter: Payer: Self-pay | Admitting: Internal Medicine

## 2022-11-24 VITALS — BP 120/68 | HR 74 | Temp 98.1°F | Ht 60.0 in | Wt 141.0 lb

## 2022-11-24 DIAGNOSIS — R55 Syncope and collapse: Secondary | ICD-10-CM

## 2022-11-24 DIAGNOSIS — E785 Hyperlipidemia, unspecified: Secondary | ICD-10-CM | POA: Diagnosis not present

## 2022-11-24 DIAGNOSIS — I10 Essential (primary) hypertension: Secondary | ICD-10-CM

## 2022-11-24 DIAGNOSIS — I2510 Atherosclerotic heart disease of native coronary artery without angina pectoris: Secondary | ICD-10-CM | POA: Diagnosis not present

## 2022-11-24 DIAGNOSIS — N183 Chronic kidney disease, stage 3 unspecified: Secondary | ICD-10-CM

## 2022-11-24 DIAGNOSIS — R739 Hyperglycemia, unspecified: Secondary | ICD-10-CM | POA: Diagnosis not present

## 2022-11-24 DIAGNOSIS — I499 Cardiac arrhythmia, unspecified: Secondary | ICD-10-CM

## 2022-11-24 DIAGNOSIS — E871 Hypo-osmolality and hyponatremia: Secondary | ICD-10-CM | POA: Diagnosis not present

## 2022-11-24 MED ORDER — LORAZEPAM 0.5 MG PO TABS: 0.5000 mg | ORAL_TABLET | Freq: Four times a day (QID) | ORAL | 0 refills | Status: AC | PRN

## 2022-11-24 NOTE — Assessment & Plan Note (Signed)
Monitor A1c, CMET 

## 2022-11-24 NOTE — Assessment & Plan Note (Signed)
Mild. Will watch 

## 2022-11-24 NOTE — Assessment & Plan Note (Signed)
Mild On Lovastatin, ASA Chronic Pt is seeing her heart doctor - Dr Sharyn Lull Cont on Lovastatin

## 2022-11-24 NOTE — Progress Notes (Signed)
Subjective:  Patient ID: Cindy Simpson, female    DOB: July 16, 1946  Age: 77 y.o. MRN: [id number]  CC: No chief complaint on file.   HPI [name] presents for post - hospital stay: Per hx: "Admit date: 11/14/2022 Discharge date: 11/15/2022 Recommendations for Outpatient Follow-up:  Follow up with PCP in 1 weeks-call for appointment Please obtain BMP/CBC in one week   Discharge Dispo: home Discharge Condition: Stable Code Status:   Code Status: Full Code Diet recommendation:  Diet Order                  Diet regular Room service appropriate? Yes; Fluid consistency: Thin  Diet effective now                         Brief/Interim Summary:  77 y.o. female with medical history significant of HTN HLD CAD hypothyroidism, presented with presyncopal event upon standing. She was in the kitchen tried to stand up and became diaphoretic and weak and had to sit down. For EMS CBG was 75 she got a cup of juice and it went up to 143 in the way to the hospital patient had a run of V. tach on event monitor but then returned sinus She has history of palpitation and some sort of rhythm abnormality nd is on verpamil Husband says she has been working  outside for the past 3 days She used to have a glucometer to check her BG bc of frequent sugar crushes but never learned how to use it Patient was admitted for further management.   No evidence of V. tach, seen by cardiology worked with PT OT negative orthostatics. Echo showed EF 60 to 65%, no RWMA normal pulmonary artery systolic pressure aortic valve is tricuspid mitral valve is normal. Overall feeling much better.  Initially Dr. Ane Payment who plan for 4 weeks of cardiac monitoring as outpatient and outpatient follow-up already arranged, possible perfusion study as outpatient. 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 Bp meds held-continue to hold and discussed with PCP and cardiology as outpatient        Recent Labs    01/12/22 0927 09/29/22 1001 11/14/22 1625 11/14/22 2210 11/15/22 0013  BUN 22 15 28* 23 21  CREATININE 1.11 0.96 1.20* 0.98 0.95    Mild hyponatremia due to dehydration stable encourage oral intake Presyncope Episode of ? V. tach during transport: Seen by cardiology currently no VT noted , artifact noted, echo ordered by cardiology-unremarkable currently orthostatic vitals are negative did well with PT. outpatient cardiac monitoring is being arranged   Elevated troponin: 94>> 123> 85, follow-up echo unremarkable, continue aspirin, Lipitor> possible perfusion study as outpatient History of Takotsubo cardiomyopathy with normal EF most recently cardiac cath in 2017 with minimal coronary plaque Dyslipidemia continue statin Hypertension allow blood pressure to run high, holding meds.  Orthostatic vitals negative.  Patient to check blood pressure at  Subjective:  Patient ID: Cindy Simpson, female    DOB: July 16, 1946  Age: 77 y.o. MRN: [id number]  CC: No chief complaint on file.   HPI [name] presents for post - hospital stay: Per hx: "Admit date: 11/14/2022 Discharge date: 11/15/2022 Recommendations for Outpatient Follow-up:  Follow up with PCP in 1 weeks-call for appointment Please obtain BMP/CBC in one week   Discharge Dispo: home Discharge Condition: Stable Code Status:   Code Status: Full Code Diet recommendation:  Diet Order                  Diet regular Room service appropriate? Yes; Fluid consistency: Thin  Diet effective now                         Brief/Interim Summary:  77 y.o. female with medical history significant of HTN HLD CAD hypothyroidism, presented with presyncopal event upon standing. She was in the kitchen tried to stand up and became diaphoretic and weak and had to sit down. For EMS CBG was 75 she got a cup of juice and it went up to 143 in the way to the hospital patient had a run of V. tach on event monitor but then returned sinus She has history of palpitation and some sort of rhythm abnormality nd is on verpamil Husband says she has been working  outside for the past 3 days She used to have a glucometer to check her BG bc of frequent sugar crushes but never learned how to use it Patient was admitted for further management.   No evidence of V. tach, seen by cardiology worked with PT OT negative orthostatics. Echo showed EF 60 to 65%, no RWMA normal pulmonary artery systolic pressure aortic valve is tricuspid mitral valve is normal. Overall feeling much better.  Initially Dr. Ane Payment who plan for 4 weeks of cardiac monitoring as outpatient and outpatient follow-up already arranged, possible perfusion study as outpatient. 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 Bp meds held-continue to hold and discussed with PCP and cardiology as outpatient        Recent Labs    01/12/22 0927 09/29/22 1001 11/14/22 1625 11/14/22 2210 11/15/22 0013  BUN 22 15 28* 23 21  CREATININE 1.11 0.96 1.20* 0.98 0.95    Mild hyponatremia due to dehydration stable encourage oral intake Presyncope Episode of ? V. tach during transport: Seen by cardiology currently no VT noted , artifact noted, echo ordered by cardiology-unremarkable currently orthostatic vitals are negative did well with PT. outpatient cardiac monitoring is being arranged   Elevated troponin: 94>> 123> 85, follow-up echo unremarkable, continue aspirin, Lipitor> possible perfusion study as outpatient History of Takotsubo cardiomyopathy with normal EF most recently cardiac cath in 2017 with minimal coronary plaque Dyslipidemia continue statin Hypertension allow blood pressure to run high, holding meds.  Orthostatic vitals negative.  Patient to check blood pressure at  distress.    Appearance: She is well-developed. She is obese.  HENT:     Head: Normocephalic.     Right Ear: External ear normal.     Left Ear: External ear normal.     Nose: Nose normal.  Eyes:     General:        Right eye: No discharge.        Left eye: No discharge.     Conjunctiva/sclera: Conjunctivae normal.     Pupils: Pupils are equal, round, and reactive to light.  Neck:     Thyroid: No thyromegaly.     Vascular: No JVD.     Trachea: No tracheal deviation.  Cardiovascular:     Rate and Rhythm: Normal rate and regular rhythm.     Heart sounds: Normal heart sounds.  Pulmonary:     Effort: No respiratory distress.     Breath sounds: No stridor. No wheezing.  Abdominal:     General: Bowel sounds are normal. There is no distension.     Palpations: Abdomen is soft. There is no mass.     Tenderness: There is no abdominal tenderness. There is no guarding or rebound.  Musculoskeletal:        General: No tenderness.     Cervical back: Normal range of motion and neck supple. No rigidity.  Lymphadenopathy:     Cervical: No cervical adenopathy.  Skin:    Findings: No erythema or rash.  Neurological:     Cranial Nerves: No cranial nerve deficit.     Motor: No abnormal muscle tone.     Coordination: Coordination normal.      Deep Tendon Reflexes: Reflexes normal.  Psychiatric:        Behavior: Behavior normal.        Thought Content: Thought content normal.        Judgment: Judgment normal.     Lab Results  Component Value Date   WBC 11.1 (H) 11/15/2022   HGB 10.3 (L) 11/15/2022   HCT 30.9 (L) 11/15/2022   PLT 304 11/15/2022   GLUCOSE 111 (H) 11/15/2022   CHOL 160 10/12/2021   TRIG 93 10/12/2021   HDL 59 10/12/2021   LDLDIRECT 137.5 03/17/2009   LDLCALC 84 10/12/2021   ALT 15 11/15/2022   AST 26 11/15/2022   NA 132 (L) 11/15/2022   K 4.5 11/15/2022   CL 99 11/15/2022   CREATININE 0.95 11/15/2022   BUN 21 11/15/2022   CO2 23 11/15/2022   TSH 1.831 11/14/2022   INR 1.2 11/14/2022   HGBA1C 6.1 01/12/2022    ECHOCARDIOGRAM COMPLETE  Result Date: 11/15/2022    ECHOCARDIOGRAM REPORT   Patient Name:   TOMMA SPIVA Date of Exam: 11/15/2022 Medical Rec #:  [id number]       Height:       60.0 in Accession #:    [id number]      Weight:       145.1 lb Date of Birth:  [birth year]      BSA:          1.628 m Patient Age:    76 years        BP:           124/49 mmHg Patient Gender: F               HR:           76 bpm. Exam Location:  Inpatient Procedure: 2D Echo, Cardiac Doppler and Color Doppler  Subjective:  Patient ID: Cindy Simpson, female    DOB: July 16, 1946  Age: 77 y.o. MRN: [id number]  CC: No chief complaint on file.   HPI [name] presents for post - hospital stay: Per hx: "Admit date: 11/14/2022 Discharge date: 11/15/2022 Recommendations for Outpatient Follow-up:  Follow up with PCP in 1 weeks-call for appointment Please obtain BMP/CBC in one week   Discharge Dispo: home Discharge Condition: Stable Code Status:   Code Status: Full Code Diet recommendation:  Diet Order                  Diet regular Room service appropriate? Yes; Fluid consistency: Thin  Diet effective now                         Brief/Interim Summary:  77 y.o. female with medical history significant of HTN HLD CAD hypothyroidism, presented with presyncopal event upon standing. She was in the kitchen tried to stand up and became diaphoretic and weak and had to sit down. For EMS CBG was 75 she got a cup of juice and it went up to 143 in the way to the hospital patient had a run of V. tach on event monitor but then returned sinus She has history of palpitation and some sort of rhythm abnormality nd is on verpamil Husband says she has been working  outside for the past 3 days She used to have a glucometer to check her BG bc of frequent sugar crushes but never learned how to use it Patient was admitted for further management.   No evidence of V. tach, seen by cardiology worked with PT OT negative orthostatics. Echo showed EF 60 to 65%, no RWMA normal pulmonary artery systolic pressure aortic valve is tricuspid mitral valve is normal. Overall feeling much better.  Initially Dr. Ane Payment who plan for 4 weeks of cardiac monitoring as outpatient and outpatient follow-up already arranged, possible perfusion study as outpatient. 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 Bp meds held-continue to hold and discussed with PCP and cardiology as outpatient        Recent Labs    01/12/22 0927 09/29/22 1001 11/14/22 1625 11/14/22 2210 11/15/22 0013  BUN 22 15 28* 23 21  CREATININE 1.11 0.96 1.20* 0.98 0.95    Mild hyponatremia due to dehydration stable encourage oral intake Presyncope Episode of ? V. tach during transport: Seen by cardiology currently no VT noted , artifact noted, echo ordered by cardiology-unremarkable currently orthostatic vitals are negative did well with PT. outpatient cardiac monitoring is being arranged   Elevated troponin: 94>> 123> 85, follow-up echo unremarkable, continue aspirin, Lipitor> possible perfusion study as outpatient History of Takotsubo cardiomyopathy with normal EF most recently cardiac cath in 2017 with minimal coronary plaque Dyslipidemia continue statin Hypertension allow blood pressure to run high, holding meds.  Orthostatic vitals negative.  Patient to check blood pressure at  distress.    Appearance: She is well-developed. She is obese.  HENT:     Head: Normocephalic.     Right Ear: External ear normal.     Left Ear: External ear normal.     Nose: Nose normal.  Eyes:     General:        Right eye: No discharge.        Left eye: No discharge.     Conjunctiva/sclera: Conjunctivae normal.     Pupils: Pupils are equal, round, and reactive to light.  Neck:     Thyroid: No thyromegaly.     Vascular: No JVD.     Trachea: No tracheal deviation.  Cardiovascular:     Rate and Rhythm: Normal rate and regular rhythm.     Heart sounds: Normal heart sounds.  Pulmonary:     Effort: No respiratory distress.     Breath sounds: No stridor. No wheezing.  Abdominal:     General: Bowel sounds are normal. There is no distension.     Palpations: Abdomen is soft. There is no mass.     Tenderness: There is no abdominal tenderness. There is no guarding or rebound.  Musculoskeletal:        General: No tenderness.     Cervical back: Normal range of motion and neck supple. No rigidity.  Lymphadenopathy:     Cervical: No cervical adenopathy.  Skin:    Findings: No erythema or rash.  Neurological:     Cranial Nerves: No cranial nerve deficit.     Motor: No abnormal muscle tone.     Coordination: Coordination normal.      Deep Tendon Reflexes: Reflexes normal.  Psychiatric:        Behavior: Behavior normal.        Thought Content: Thought content normal.        Judgment: Judgment normal.     Lab Results  Component Value Date   WBC 11.1 (H) 11/15/2022   HGB 10.3 (L) 11/15/2022   HCT 30.9 (L) 11/15/2022   PLT 304 11/15/2022   GLUCOSE 111 (H) 11/15/2022   CHOL 160 10/12/2021   TRIG 93 10/12/2021   HDL 59 10/12/2021   LDLDIRECT 137.5 03/17/2009   LDLCALC 84 10/12/2021   ALT 15 11/15/2022   AST 26 11/15/2022   NA 132 (L) 11/15/2022   K 4.5 11/15/2022   CL 99 11/15/2022   CREATININE 0.95 11/15/2022   BUN 21 11/15/2022   CO2 23 11/15/2022   TSH 1.831 11/14/2022   INR 1.2 11/14/2022   HGBA1C 6.1 01/12/2022    ECHOCARDIOGRAM COMPLETE  Result Date: 11/15/2022    ECHOCARDIOGRAM REPORT   Patient Name:   TOMMA SPIVA Date of Exam: 11/15/2022 Medical Rec #:  [id number]       Height:       60.0 in Accession #:    [id number]      Weight:       145.1 lb Date of Birth:  [birth year]      BSA:          1.628 m Patient Age:    76 years        BP:           124/49 mmHg Patient Gender: F               HR:           76 bpm. Exam Location:  Inpatient Procedure: 2D Echo, Cardiac Doppler and Color Doppler  Subjective:  Patient ID: Cindy Simpson, female    DOB: July 16, 1946  Age: 77 y.o. MRN: [id number]  CC: No chief complaint on file.   HPI [name] presents for post - hospital stay: Per hx: "Admit date: 11/14/2022 Discharge date: 11/15/2022 Recommendations for Outpatient Follow-up:  Follow up with PCP in 1 weeks-call for appointment Please obtain BMP/CBC in one week   Discharge Dispo: home Discharge Condition: Stable Code Status:   Code Status: Full Code Diet recommendation:  Diet Order                  Diet regular Room service appropriate? Yes; Fluid consistency: Thin  Diet effective now                         Brief/Interim Summary:  77 y.o. female with medical history significant of HTN HLD CAD hypothyroidism, presented with presyncopal event upon standing. She was in the kitchen tried to stand up and became diaphoretic and weak and had to sit down. For EMS CBG was 75 she got a cup of juice and it went up to 143 in the way to the hospital patient had a run of V. tach on event monitor but then returned sinus She has history of palpitation and some sort of rhythm abnormality nd is on verpamil Husband says she has been working  outside for the past 3 days She used to have a glucometer to check her BG bc of frequent sugar crushes but never learned how to use it Patient was admitted for further management.   No evidence of V. tach, seen by cardiology worked with PT OT negative orthostatics. Echo showed EF 60 to 65%, no RWMA normal pulmonary artery systolic pressure aortic valve is tricuspid mitral valve is normal. Overall feeling much better.  Initially Dr. Ane Payment who plan for 4 weeks of cardiac monitoring as outpatient and outpatient follow-up already arranged, possible perfusion study as outpatient. 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 Bp meds held-continue to hold and discussed with PCP and cardiology as outpatient        Recent Labs    01/12/22 0927 09/29/22 1001 11/14/22 1625 11/14/22 2210 11/15/22 0013  BUN 22 15 28* 23 21  CREATININE 1.11 0.96 1.20* 0.98 0.95    Mild hyponatremia due to dehydration stable encourage oral intake Presyncope Episode of ? V. tach during transport: Seen by cardiology currently no VT noted , artifact noted, echo ordered by cardiology-unremarkable currently orthostatic vitals are negative did well with PT. outpatient cardiac monitoring is being arranged   Elevated troponin: 94>> 123> 85, follow-up echo unremarkable, continue aspirin, Lipitor> possible perfusion study as outpatient History of Takotsubo cardiomyopathy with normal EF most recently cardiac cath in 2017 with minimal coronary plaque Dyslipidemia continue statin Hypertension allow blood pressure to run high, holding meds.  Orthostatic vitals negative.  Patient to check blood pressure at

## 2022-11-24 NOTE — Assessment & Plan Note (Signed)
Cont on Lovastatin 

## 2022-11-24 NOTE — Assessment & Plan Note (Signed)
Monitor GFR Hydrate well 

## 2022-11-24 NOTE — Assessment & Plan Note (Signed)
Chronic On Coreg, Diovan 

## 2022-11-30 DIAGNOSIS — R55 Syncope and collapse: Secondary | ICD-10-CM

## 2022-11-30 DIAGNOSIS — I499 Cardiac arrhythmia, unspecified: Secondary | ICD-10-CM

## 2022-12-21 DIAGNOSIS — R002 Palpitations: Secondary | ICD-10-CM | POA: Insufficient documentation

## 2022-12-21 NOTE — Progress Notes (Signed)
Cardiology Office Note:   Date:  12/23/2022  ID:  Cindy Simpson, DOB [birth year], MRN [id number] PCP: Tresa Garter, MD  [facility] Providers Cardiologist:  None    History of Present Illness:   Cindy Simpson is a 77 y.o. female who we saw in the hospital for palpitations.  What was thought to be VTach was artifact.  Echo was unremarkable.   The plan was for an out patient perfusion study.   A 30 day event monitor was ordered.  She is currently wearing this.  She has not had any palpitations, presyncope or syncope.  She denies any chest pressure, neck or arm discomfort other than an episode 1 day a few days ago when she had some mid chest discomfort.  This was to lasting about a couple of minutes.  There was no radiation.  She said it was 7 out of 10.  Sharp.  There was no associated nausea vomiting or diaphoresis.  She has not really had this before.  She is able to push a lawnmower without bringing this on.     ROS: As stated in the HPI and negative for all other systems.  Studies Reviewed:    EKG: Sinus rhythm, rate 63, axis within normal limits, RSR prime V1 V2, no acute ST-T wave changes.   Risk Assessment/Calculations:              Physical Exam:   VS:  BP (!) 108/58 (BP Location: Left Arm, Patient Position: Sitting, Cuff Size: Normal)   Pulse 66   Ht 5\' 1"  (1.549 m)   Wt 143 lb 3.2 oz (65 kg)   SpO2 97%   BMI 27.06 kg/m    Wt Readings from Last 3 Encounters:  12/23/22 143 lb 3.2 oz (65 kg)  11/24/22 141 lb (64 kg)  11/14/22 145 lb 1 oz (65.8 kg)     GEN: Well nourished, well developed in no acute distress NECK: No JVD; No carotid bruits CARDIAC: RRR, no murmurs, rubs, gallops RESPIRATORY:  Clear to auscultation without rales, wheezing or rhonchi  ABDOMEN: Soft, non-tender, non-distended EXTREMITIES:  No edema; No deformity   ASSESSMENT AND PLAN:   Possible arrhythmia:   She is wearing a monitor.  In the hospital the ventricular tachycardia  reported was artifact.  I will review the results of the monitor when available   Elevated troponin:    Because of the discomfort and the mildly elevated troponin in the hospital I will check a PET study.  Of note she had normal coronaries in 2017   History of Takotsubo:   Her EF was mildly reduced in the past but normal on recent echo.    Presnycope: She was hypotensive with mild dehydration in the hospital.  She is wearing the monitor as above.  She has not had any further symptoms.  She has been hydrated.  No further workup is planned.   HTN: Her blood pressure is currently at target.  No change in therapy.   AKI:  Mild AKI.  I will check a basic metabolic profile because of the mildly elevated creatinine and low potassium last time she was in the hospital.      Hyponatremia:    As above.         Signed, Rollene Rotunda, MD

## 2022-12-23 ENCOUNTER — Ambulatory Visit: Payer: Medicare Other | Attending: Cardiology | Admitting: Cardiology

## 2022-12-23 ENCOUNTER — Encounter: Payer: Self-pay | Admitting: Cardiology

## 2022-12-23 VITALS — BP 108/58 | HR 66 | Ht 61.0 in | Wt 143.2 lb

## 2022-12-23 DIAGNOSIS — R002 Palpitations: Secondary | ICD-10-CM

## 2022-12-23 DIAGNOSIS — R7989 Other specified abnormal findings of blood chemistry: Secondary | ICD-10-CM | POA: Diagnosis not present

## 2022-12-23 LAB — BASIC METABOLIC PANEL
BUN/Creatinine Ratio: 22 (ref 12–28)
BUN: 24 mg/dL (ref 8–27)
CO2: 26 mmol/L (ref 20–29)
Calcium: 9.7 mg/dL (ref 8.7–10.3)
Chloride: 98 mmol/L (ref 96–106)
Creatinine, Ser: 1.07 mg/dL — ABNORMAL HIGH (ref 0.57–1.00)
Glucose: 89 mg/dL (ref 70–99)
Potassium: 4.7 mmol/L (ref 3.5–5.2)
Sodium: 135 mmol/L (ref 134–144)
eGFR: 53 mL/min/{1.73_m2} — ABNORMAL LOW (ref >59)

## 2022-12-23 NOTE — Patient Instructions (Signed)
Testing/Procedures:  How to Prepare for Your Cardiac PET/CT Stress Test:  1. Please take all medications before your test:     2. Nothing to eat or drink, except water, 3 hours prior to arrival time.   NO caffeine/decaffeinated products, or chocolate 12 hours prior to arrival.  3. NO perfume, cologne or lotion  4. Total time is 1 to 2 hours; you may want to bring reading material for the waiting time.  5. Please report to Radiology at the [facility] Main Entrance 30 minutes early for your test.  [address] [place], [place] [address]  Diabetic Preparation:  Hold oral medications. You may take NPH and Lantus insulin. Do not take Humalog or Humulin R (Regular Insulin) the day of your test. Check blood sugars prior to leaving the house. If able to eat breakfast prior to 3 hour fasting, you may take all medications, including your insulin, Do not worry if you miss your breakfast dose of insulin - start at your next meal.    In preparation for your appointment, medication and supplies will be purchased.  Appointment availability is limited, so if you need to cancel or reschedule, please call the Radiology Department at [phone]  24 hours in advance to avoid a cancellation fee of $100.00  What to Expect After you Arrive:  Once you arrive and check in for your appointment, you will be taken to a preparation room within the Radiology Department.  A technologist or Nurse will obtain your medical history, verify that you are correctly prepped for the exam, and explain the procedure.  Afterwards,  an IV will be started in your arm and electrodes will be placed on your skin for EKG monitoring during the stress portion of the exam. Then you will be escorted to the PET/CT scanner.  There, staff will get you positioned on the scanner and obtain a blood pressure and EKG.  During the exam, you will continue to be connected to the EKG and blood pressure machines.  A small,  safe amount of a radioactive tracer will be injected in your IV to obtain a series of pictures of your heart along with an injection of a stress agent.    After your Exam:  It is recommended that you eat a meal and drink a caffeinated beverage to counter act any effects of the stress agent.  Drink plenty of fluids for the remainder of the day and urinate frequently for the first couple of hours after the exam.  Your doctor will inform you of your test results within 7-10 business days.  For questions about your test or how to prepare for your test, please call: Rockwell Alexandria, Cardiac Imaging Nurse Navigator  Larey Brick, Cardiac Imaging Nurse Navigator Office: [phone]    Follow-Up: At [facility], you and your health needs are our priority.  As part of our continuing mission to provide you with exceptional heart care, we have created designated Provider Care Teams.  These Care Teams include your primary Cardiologist (physician) and Advanced Practice Providers (APPs -  Physician Assistants and Nurse Practitioners) who all work together to provide you with the care you need, when you need it.  We recommend signing up for the patient portal called "MyChart".  Sign up information is provided on this After Visit Summary.  MyChart is used to connect with patients for Virtual Visits (Telemedicine).  Patients are able to view lab/test results, encounter notes, upcoming appointments, etc.  Non-urgent  messages can be sent to your provider as well.   To learn more about what you can do with MyChart, go to [email or web address].     As needed

## 2022-12-27 ENCOUNTER — Encounter: Payer: Self-pay | Admitting: *Deleted

## 2022-12-30 ENCOUNTER — Telehealth: Payer: Self-pay | Admitting: Cardiology

## 2022-12-30 NOTE — Telephone Encounter (Signed)
Pt is requesting a callback to discuss some concerns she has about her results. Specifically her creatinine ser. Please advise

## 2022-12-30 NOTE — Telephone Encounter (Signed)
Discussed Labs with patient and her levels.  She wanted to know if she needed anything to help her creatine level.  Discussed no changes per MD at this time.  Function is better than a month ago.  Continue to hydrate as not hydrating enough at times.  She states understanding.

## 2023-01-03 ENCOUNTER — Ambulatory Visit: Payer: Medicare Other | Attending: Cardiology

## 2023-01-03 DIAGNOSIS — R55 Syncope and collapse: Secondary | ICD-10-CM

## 2023-01-03 DIAGNOSIS — I499 Cardiac arrhythmia, unspecified: Secondary | ICD-10-CM

## 2023-01-10 DIAGNOSIS — E782 Mixed hyperlipidemia: Secondary | ICD-10-CM | POA: Diagnosis not present

## 2023-01-10 DIAGNOSIS — R0789 Other chest pain: Secondary | ICD-10-CM | POA: Diagnosis not present

## 2023-01-10 DIAGNOSIS — I10 Essential (primary) hypertension: Secondary | ICD-10-CM | POA: Diagnosis not present

## 2023-01-10 DIAGNOSIS — I2510 Atherosclerotic heart disease of native coronary artery without angina pectoris: Secondary | ICD-10-CM | POA: Diagnosis not present

## 2023-01-13 ENCOUNTER — Ambulatory Visit: Payer: Medicare Other | Admitting: Podiatry

## 2023-01-13 DIAGNOSIS — E1149 Type 2 diabetes mellitus with other diabetic neurological complication: Secondary | ICD-10-CM | POA: Diagnosis not present

## 2023-01-13 DIAGNOSIS — B351 Tinea unguium: Secondary | ICD-10-CM

## 2023-01-13 DIAGNOSIS — M79674 Pain in right toe(s): Secondary | ICD-10-CM | POA: Diagnosis not present

## 2023-01-13 DIAGNOSIS — Q828 Other specified congenital malformations of skin: Secondary | ICD-10-CM | POA: Diagnosis not present

## 2023-01-13 DIAGNOSIS — M79675 Pain in left toe(s): Secondary | ICD-10-CM | POA: Diagnosis not present

## 2023-01-14 ENCOUNTER — Other Ambulatory Visit: Payer: Self-pay | Admitting: Internal Medicine

## 2023-01-16 ENCOUNTER — Encounter: Payer: Self-pay | Admitting: Internal Medicine

## 2023-01-16 NOTE — Progress Notes (Signed)
Subjective: Chief Complaint  Patient presents with   Nail Problem    Routine Foot Care- Nail trim     77 year old female presents the office today for evaluation of painful calluses as well as toenails.  She began some burning to the bottom of her feet and the balls of the foot.  She said the burning is associated with calluses but feels better after the trend.  No recent injuries.  No radiating pain.  Denies any open lesions.   Objective: AAO x3, NAD DP/PT pulses palpable bilaterally, CRT less than 3 seconds  Hyperkeratotic lesion submetatarsal 1, 3, 5 bilaterally.  No ongoing ulceration drainage or signs of infection.  There is no underlying ulceration drainage or any signs of infection. The nails are hypertrophic, dystrophic, discolored x 10 and are causing irritation/pain. No redness, swelling, drainage.  No open lesions or pre-ulcerative lesions.  Bunions present. Prominent metatarsal heads with atrophy of the fat pad.  No pain with calf compression, swelling, warmth, erythema  Assessment: Hyperkeratotic lesions/preulcerative calluses, symptomatic onychomycosis  Plan: -All treatment options discussed with the patient including all alternatives, risks, complications.  -Debrided the calluses x 6 without complications or bleeding.  It was tender to trim the calluses on the right foot she had pain with this.  Continue moisturizer, offloading. -Nails debrided x10 without complications or bleeding. -I do think the burning is coming more from pressure, loss of fat.  Offloading. -Discussed daily foot inspection  Return in about 3 months (around 04/15/2023).   Vivi Barrack DPM

## 2023-01-17 ENCOUNTER — Encounter: Payer: Self-pay | Admitting: *Deleted

## 2023-01-31 ENCOUNTER — Encounter: Payer: Self-pay | Admitting: Internal Medicine

## 2023-01-31 ENCOUNTER — Ambulatory Visit (INDEPENDENT_AMBULATORY_CARE_PROVIDER_SITE_OTHER): Payer: Medicare Other | Admitting: Internal Medicine

## 2023-01-31 VITALS — BP 120/68 | HR 69 | Temp 98.0°F | Ht 61.0 in | Wt 140.0 lb

## 2023-01-31 DIAGNOSIS — G8929 Other chronic pain: Secondary | ICD-10-CM

## 2023-01-31 DIAGNOSIS — E034 Atrophy of thyroid (acquired): Secondary | ICD-10-CM | POA: Diagnosis not present

## 2023-01-31 DIAGNOSIS — D485 Neoplasm of uncertain behavior of skin: Secondary | ICD-10-CM

## 2023-01-31 DIAGNOSIS — M791 Myalgia, unspecified site: Secondary | ICD-10-CM | POA: Diagnosis not present

## 2023-01-31 DIAGNOSIS — M79601 Pain in right arm: Secondary | ICD-10-CM | POA: Diagnosis not present

## 2023-01-31 DIAGNOSIS — M25512 Pain in left shoulder: Secondary | ICD-10-CM | POA: Diagnosis not present

## 2023-01-31 MED ORDER — METHYLPREDNISOLONE ACETATE 40 MG/ML IJ SUSP
40.0000 mg | Freq: Once | INTRAMUSCULAR | Status: AC
Start: 2023-01-31 — End: 2023-01-31
  Administered 2023-01-31: 40 mg via INTRAMUSCULAR

## 2023-01-31 MED ORDER — LIDOCAINE HCL 2 % IJ SOLN
20.0000 mL | Freq: Once | INTRAMUSCULAR | Status: AC
Start: 2023-01-31 — End: 2023-01-31
  Administered 2023-01-31: 400 mg

## 2023-01-31 NOTE — Assessment & Plan Note (Signed)
 Chronic On Levothroid - 

## 2023-01-31 NOTE — Assessment & Plan Note (Signed)
L shoulder pain since Sept 2023 after a COVID shot - deltoid muscle Offered trigger point inj - see procedure

## 2023-01-31 NOTE — Assessment & Plan Note (Signed)
Dry pigmented lesions on the back Skin bx suggested

## 2023-01-31 NOTE — Progress Notes (Signed)
Subjective:  Patient ID: Cindy Simpson, female    DOB: [birth year]  Age: 77 y.o. MRN: [id number]  CC: Follow-up (4 mnth f/u, needs referral for dermatology due to spots on back causing concern)   HPI DE LIBMAN presents for skin spots C/o L shoulder pain since Sept 2023 after a COVID shot  Outpatient Medications Prior to Visit  Medication Sig Dispense Refill   Aspirin 81 MG CAPS Take 1 tablet by mouth daily.     atorvastatin (LIPITOR) 20 MG tablet Take 1 tablet (20 mg total) by mouth daily. 90 tablet 3   bismuth subsalicylate (PEPTO BISMOL) 262 MG/15ML suspension Take 30 mLs by mouth every 6 (six) hours as needed for indigestion.     carvedilol (COREG) 6.25 MG tablet TAKE 1 TABLET BY MOUTH TWICE  DAILY WITH A MEAL. ANNUAL  APPOINTMENT DUE IN JULY MUST SEE PROVIDER FOR FUTURE REFILLS 200 tablet 2   cetirizine (ZYRTEC) 10 MG tablet Take 10 mg by mouth at bedtime.     Cholecalciferol 1000 UNITS tablet Take 1,000 Units by mouth daily after supper.      Continuous Blood Gluc Receiver (FREESTYLE LIBRE 2 READER) DEVI 1 Units by Does not apply route every 14 (fourteen) days. 1 each 0   Continuous Blood Gluc Sensor (FREESTYLE LIBRE 2 SENSOR) MISC 1 Units by Does not apply route every 14 (fourteen) days. 6 each 3   diclofenac sodium (VOLTAREN) 1 % GEL Apply 2 g topically 4 (four) times daily. (Patient taking differently: Apply 2 g topically daily as needed (for pain).) 100 g 3   FLUZONE HIGH-DOSE QUADRIVALENT 0.7 ML SUSY      glucose blood (ACCU-CHEK AVIVA) test strip Use to check blood sugars twice a day 100 each 5   Lancets (ACCU-CHEK SOFT TOUCH) lancets Use to check blood sugars twice a day 100 each 5   levothyroxine (SYNTHROID) 100 MCG tablet Take 100 mcg by mouth at bedtime.     LORazepam (ATIVAN) 0.5 MG tablet Take 1 tablet (0.5 mg total) by mouth every 6 (six) hours as needed for anxiety (anxiety or panic feeling). 30 tablet 0   pantoprazole (PROTONIX) 40 MG tablet TAKE 1 TABLET BY  MOUTH TWICE  DAILY 200 tablet 2   valsartan (DIOVAN) 80 MG tablet Take 80 mg by mouth daily.     carvedilol (COREG) 3.125 MG tablet Take 1 tablet (3.125 mg total) by mouth 2 (two) times daily. 30 tablet 1   No facility-administered medications prior to visit.    ROS: Review of Systems  Objective:  BP 120/68 (BP Location: Left Arm, Patient Position: Sitting, Cuff Size: Large)   Pulse 69   Temp 98 F (36.7 C) (Oral)   Ht 5\' 1"  (1.549 m)   Wt 140 lb (63.5 kg)   SpO2 97%   BMI 26.45 kg/m   BP Readings from Last 3 Encounters:  01/31/23 120/68  12/23/22 (!) 108/58  11/24/22 120/68    Wt Readings from Last 3 Encounters:  01/31/23 140 lb (63.5 kg)  12/23/22 143 lb 3.2 oz (65 kg)  11/24/22 141 lb (64 kg)    Physical Exam L deltoid w/pain Dry pigmented lesions on the back   Procedure Note :    Trigger Point Injection:   Indication : Focal tender area identifiable by the location without other identifiable neurologic or musculoskeletal finding or pathology.   Risks including unsuccessful procedure , bleeding, infection, bruising, skin atrophy and others were explained to the patient in  detail as well as the benefits. Informed consent was obtained and signed.   Tthe patient was placed in a comfortable position.   4 points of maximum tenderness over R deltoid muscle were marked and  the skin was prepped with Betadine and alcohol. 1 inch 25-gauge needle was used. The needle was advanced perpendicular to the skin. Each trigger point was injected with 1 mL of 2% lidocaine and 10 mg of Depo-Medrol in a usual fashion.  Band-Aids applied.   Tolerated well. Complications: one bruise/small hematoma. Good pain relief following the procedure.  Lab Results  Component Value Date   WBC 11.1 (H) 11/15/2022   HGB 10.3 (L) 11/15/2022   HCT 30.9 (L) 11/15/2022   PLT 304 11/15/2022   GLUCOSE 89 12/23/2022   CHOL 160 10/12/2021   TRIG 93 10/12/2021   HDL 59 10/12/2021   LDLDIRECT 137.5  03/17/2009   LDLCALC 84 10/12/2021   ALT 15 11/15/2022   AST 26 11/15/2022   NA 135 12/23/2022   K 4.7 12/23/2022   CL 98 12/23/2022   CREATININE 1.07 (H) 12/23/2022   BUN 24 12/23/2022   CO2 26 12/23/2022   TSH 1.831 11/14/2022   INR 1.2 11/14/2022   HGBA1C 6.1 01/12/2022    ECHOCARDIOGRAM COMPLETE  Result Date: 11/15/2022    ECHOCARDIOGRAM REPORT   Patient Name:   Cindy Simpson Date of Exam: 11/15/2022 Medical Rec #:  [id number]       Height:       60.0 in Accession #:    [id number]      Weight:       145.1 lb Date of Birth:  1946/04/05      BSA:          1.628 m Patient Age:    76 years        BP:           124/49 mmHg Patient Gender: F               HR:           76 bpm. Exam Location:  Inpatient Procedure: 2D Echo, Cardiac Doppler and Color Doppler Indications:    syncope  History:        Patient has prior history of Echocardiogram examinations, most                 recent 03/07/2015. Arrythmias:V-Tach; Risk Factors:Hypertension                 and Dyslipidemia.  Sonographer:    Delcie Roch RDCS Referring Phys: [id number] ANASTASSIA DOUTOVA IMPRESSIONS  1. Left ventricular ejection fraction, by estimation, is 60 to 65%. The left ventricle has normal function. The left ventricle has no regional wall motion abnormalities. Left ventricular diastolic parameters were normal.  2. Right ventricular systolic function is normal. The right ventricular size is normal. There is normal pulmonary artery systolic pressure.  3. The mitral valve is normal in structure. Trivial mitral valve regurgitation. No evidence of mitral stenosis.  4. The aortic valve is tricuspid. Aortic valve regurgitation is not visualized. Aortic valve sclerosis is present, with no evidence of aortic valve stenosis.  5. The inferior vena cava is normal in size with greater than 50% respiratory variability, suggesting right atrial pressure of 3 mmHg. FINDINGS  Left Ventricle: Left ventricular ejection fraction, by estimation, is 60 to  65%. The left ventricle has normal function. The left ventricle has no regional wall motion abnormalities. The left ventricular internal  Subjective:  Patient ID: Cindy Simpson, female    DOB: [birth year]  Age: 77 y.o. MRN: [id number]  CC: Follow-up (4 mnth f/u, needs referral for dermatology due to spots on back causing concern)   HPI DE LIBMAN presents for skin spots C/o L shoulder pain since Sept 2023 after a COVID shot  Outpatient Medications Prior to Visit  Medication Sig Dispense Refill   Aspirin 81 MG CAPS Take 1 tablet by mouth daily.     atorvastatin (LIPITOR) 20 MG tablet Take 1 tablet (20 mg total) by mouth daily. 90 tablet 3   bismuth subsalicylate (PEPTO BISMOL) 262 MG/15ML suspension Take 30 mLs by mouth every 6 (six) hours as needed for indigestion.     carvedilol (COREG) 6.25 MG tablet TAKE 1 TABLET BY MOUTH TWICE  DAILY WITH A MEAL. ANNUAL  APPOINTMENT DUE IN JULY MUST SEE PROVIDER FOR FUTURE REFILLS 200 tablet 2   cetirizine (ZYRTEC) 10 MG tablet Take 10 mg by mouth at bedtime.     Cholecalciferol 1000 UNITS tablet Take 1,000 Units by mouth daily after supper.      Continuous Blood Gluc Receiver (FREESTYLE LIBRE 2 READER) DEVI 1 Units by Does not apply route every 14 (fourteen) days. 1 each 0   Continuous Blood Gluc Sensor (FREESTYLE LIBRE 2 SENSOR) MISC 1 Units by Does not apply route every 14 (fourteen) days. 6 each 3   diclofenac sodium (VOLTAREN) 1 % GEL Apply 2 g topically 4 (four) times daily. (Patient taking differently: Apply 2 g topically daily as needed (for pain).) 100 g 3   FLUZONE HIGH-DOSE QUADRIVALENT 0.7 ML SUSY      glucose blood (ACCU-CHEK AVIVA) test strip Use to check blood sugars twice a day 100 each 5   Lancets (ACCU-CHEK SOFT TOUCH) lancets Use to check blood sugars twice a day 100 each 5   levothyroxine (SYNTHROID) 100 MCG tablet Take 100 mcg by mouth at bedtime.     LORazepam (ATIVAN) 0.5 MG tablet Take 1 tablet (0.5 mg total) by mouth every 6 (six) hours as needed for anxiety (anxiety or panic feeling). 30 tablet 0   pantoprazole (PROTONIX) 40 MG tablet TAKE 1 TABLET BY  MOUTH TWICE  DAILY 200 tablet 2   valsartan (DIOVAN) 80 MG tablet Take 80 mg by mouth daily.     carvedilol (COREG) 3.125 MG tablet Take 1 tablet (3.125 mg total) by mouth 2 (two) times daily. 30 tablet 1   No facility-administered medications prior to visit.    ROS: Review of Systems  Objective:  BP 120/68 (BP Location: Left Arm, Patient Position: Sitting, Cuff Size: Large)   Pulse 69   Temp 98 F (36.7 C) (Oral)   Ht 5\' 1"  (1.549 m)   Wt 140 lb (63.5 kg)   SpO2 97%   BMI 26.45 kg/m   BP Readings from Last 3 Encounters:  01/31/23 120/68  12/23/22 (!) 108/58  11/24/22 120/68    Wt Readings from Last 3 Encounters:  01/31/23 140 lb (63.5 kg)  12/23/22 143 lb 3.2 oz (65 kg)  11/24/22 141 lb (64 kg)    Physical Exam L deltoid w/pain Dry pigmented lesions on the back   Procedure Note :    Trigger Point Injection:   Indication : Focal tender area identifiable by the location without other identifiable neurologic or musculoskeletal finding or pathology.   Risks including unsuccessful procedure , bleeding, infection, bruising, skin atrophy and others were explained to the patient in  detail as well as the benefits. Informed consent was obtained and signed.   Tthe patient was placed in a comfortable position.   4 points of maximum tenderness over R deltoid muscle were marked and  the skin was prepped with Betadine and alcohol. 1 inch 25-gauge needle was used. The needle was advanced perpendicular to the skin. Each trigger point was injected with 1 mL of 2% lidocaine and 10 mg of Depo-Medrol in a usual fashion.  Band-Aids applied.   Tolerated well. Complications: one bruise/small hematoma. Good pain relief following the procedure.  Lab Results  Component Value Date   WBC 11.1 (H) 11/15/2022   HGB 10.3 (L) 11/15/2022   HCT 30.9 (L) 11/15/2022   PLT 304 11/15/2022   GLUCOSE 89 12/23/2022   CHOL 160 10/12/2021   TRIG 93 10/12/2021   HDL 59 10/12/2021   LDLDIRECT 137.5  03/17/2009   LDLCALC 84 10/12/2021   ALT 15 11/15/2022   AST 26 11/15/2022   NA 135 12/23/2022   K 4.7 12/23/2022   CL 98 12/23/2022   CREATININE 1.07 (H) 12/23/2022   BUN 24 12/23/2022   CO2 26 12/23/2022   TSH 1.831 11/14/2022   INR 1.2 11/14/2022   HGBA1C 6.1 01/12/2022    ECHOCARDIOGRAM COMPLETE  Result Date: 11/15/2022    ECHOCARDIOGRAM REPORT   Patient Name:   Cindy Simpson Date of Exam: 11/15/2022 Medical Rec #:  [id number]       Height:       60.0 in Accession #:    [id number]      Weight:       145.1 lb Date of Birth:  1946/04/05      BSA:          1.628 m Patient Age:    76 years        BP:           124/49 mmHg Patient Gender: F               HR:           76 bpm. Exam Location:  Inpatient Procedure: 2D Echo, Cardiac Doppler and Color Doppler Indications:    syncope  History:        Patient has prior history of Echocardiogram examinations, most                 recent 03/07/2015. Arrythmias:V-Tach; Risk Factors:Hypertension                 and Dyslipidemia.  Sonographer:    Delcie Roch RDCS Referring Phys: [id number] ANASTASSIA DOUTOVA IMPRESSIONS  1. Left ventricular ejection fraction, by estimation, is 60 to 65%. The left ventricle has normal function. The left ventricle has no regional wall motion abnormalities. Left ventricular diastolic parameters were normal.  2. Right ventricular systolic function is normal. The right ventricular size is normal. There is normal pulmonary artery systolic pressure.  3. The mitral valve is normal in structure. Trivial mitral valve regurgitation. No evidence of mitral stenosis.  4. The aortic valve is tricuspid. Aortic valve regurgitation is not visualized. Aortic valve sclerosis is present, with no evidence of aortic valve stenosis.  5. The inferior vena cava is normal in size with greater than 50% respiratory variability, suggesting right atrial pressure of 3 mmHg. FINDINGS  Left Ventricle: Left ventricular ejection fraction, by estimation, is 60 to  65%. The left ventricle has normal function. The left ventricle has no regional wall motion abnormalities. The left ventricular internal

## 2023-02-14 ENCOUNTER — Ambulatory Visit (INDEPENDENT_AMBULATORY_CARE_PROVIDER_SITE_OTHER): Payer: Medicare Other | Admitting: Internal Medicine

## 2023-02-14 VITALS — BP 118/76 | HR 59 | Temp 98.6°F | Ht 61.0 in | Wt 136.0 lb

## 2023-02-14 DIAGNOSIS — D485 Neoplasm of uncertain behavior of skin: Secondary | ICD-10-CM

## 2023-02-14 DIAGNOSIS — L820 Inflamed seborrheic keratosis: Secondary | ICD-10-CM | POA: Diagnosis not present

## 2023-02-14 NOTE — Progress Notes (Signed)
Subjective:  Patient ID: Cindy Simpson, female    DOB: 11-Jun-1946  Age: 77 y.o. MRN: [id number]  CC: Biopsy   HPI Cindy Simpson presents for skin bx  Outpatient Medications Prior to Visit  Medication Sig Dispense Refill   Aspirin 81 MG CAPS Take 1 tablet by mouth daily.     atorvastatin (LIPITOR) 20 MG tablet Take 1 tablet (20 mg total) by mouth daily. 90 tablet 3   bismuth subsalicylate (PEPTO BISMOL) 262 MG/15ML suspension Take 30 mLs by mouth every 6 (six) hours as needed for indigestion.     carvedilol (COREG) 6.25 MG tablet TAKE 1 TABLET BY MOUTH TWICE  DAILY WITH A MEAL. ANNUAL  APPOINTMENT DUE IN JULY MUST SEE PROVIDER FOR FUTURE REFILLS 200 tablet 2   cetirizine (ZYRTEC) 10 MG tablet Take 10 mg by mouth at bedtime.     Cholecalciferol 1000 UNITS tablet Take 1,000 Units by mouth daily after supper.      Continuous Blood Gluc Receiver (FREESTYLE LIBRE 2 READER) DEVI 1 Units by Does not apply route every 14 (fourteen) days. 1 each 0   Continuous Blood Gluc Sensor (FREESTYLE LIBRE 2 SENSOR) MISC 1 Units by Does not apply route every 14 (fourteen) days. 6 each 3   diclofenac sodium (VOLTAREN) 1 % GEL Apply 2 g topically 4 (four) times daily. (Patient taking differently: Apply 2 g topically daily as needed (for pain).) 100 g 3   FLUZONE HIGH-DOSE QUADRIVALENT 0.7 ML SUSY      glucose blood (ACCU-CHEK AVIVA) test strip Use to check blood sugars twice a day 100 each 5   Lancets (ACCU-CHEK SOFT TOUCH) lancets Use to check blood sugars twice a day 100 each 5   levothyroxine (SYNTHROID) 100 MCG tablet Take 100 mcg by mouth at bedtime.     LORazepam (ATIVAN) 0.5 MG tablet Take 1 tablet (0.5 mg total) by mouth every 6 (six) hours as needed for anxiety (anxiety or panic feeling). 30 tablet 0   pantoprazole (PROTONIX) 40 MG tablet TAKE 1 TABLET BY MOUTH TWICE  DAILY 200 tablet 2   valsartan (DIOVAN) 80 MG tablet Take 80 mg by mouth daily.     carvedilol (COREG) 3.125 MG tablet Take 1  tablet (3.125 mg total) by mouth 2 (two) times daily. 30 tablet 1   No facility-administered medications prior to visit.    ROS: Review of Systems  Objective:  BP 118/76 (BP Location: Left Arm, Patient Position: Sitting, Cuff Size: Normal)   Pulse (!) 59   Temp 98.6 F (37 C) (Oral)   Ht 5\' 1"  (1.549 m)   Wt 136 lb (61.7 kg)   SpO2 97%   BMI 25.70 kg/m   BP Readings from Last 3 Encounters:  02/14/23 118/76  01/31/23 120/68  12/23/22 (!) 108/58    Wt Readings from Last 3 Encounters:  02/14/23 136 lb (61.7 kg)  01/31/23 140 lb (63.5 kg)  12/23/22 143 lb 3.2 oz (65 kg)    Physical Exam   Procedure Note :     Procedure :  Skin biopsy   Indication:  Changing Simpson (s ),  Suspicious lesion(s)   Risks including unsuccessful procedure , bleeding, infection, bruising, scar, a need for another complete procedure and others were explained to the patient in detail as well as the benefits. Informed consent was obtained verbally.  The patient was placed in a decubitus position.  Lesion #1 on the L flank    measuring 11x9  mm   Skin over lesion #1  was prepped with Betadine and alcohol  and anesthetized with 1 cc of 2% lidocaine and epinephrine, using a 25-gauge 1 inch needle.  Shave biopsy with a sterile Dermablade was carried out in the usual fashion. Hyfrecator was used to destroy the rest of the lesion potentially left behind and for hemostasis. Band-Aid was applied with antibiotic ointment.    Lesion #2 on  L flank medial from #1   measuring   9x7 mm   Skin over lesion #2  was prepped with Betadine and alcohol  and anesthetized with 1 cc of 2% lidocaine and epinephrine, using a 25-gauge 1 inch needle.  Shave biopsy with a sterile Dermablade was carried out in the usual fashion. Hyfrecator was used to destroy the rest of the lesion potentially left behind and for hemostasis. Band-Aid was applied with antibiotic ointment.  Lesion #3 on  L posterior chest  measuring  11x6  mm   Skin over lesion #3  was prepped with Betadine and alcohol  and anesthetized with 1 cc of 2% lidocaine and epinephrine, using a 25-gauge 1 inch needle.  Shave biopsy with a sterile Dermablade was carried out in the usual fashion. Hyfrecator was used to destroy the rest of the lesion potentially left behind and for hemostasis. Band-Aid was applied with antibiotic ointment.   Lesion #4 on  L posterior chest  measuring  3x3 mm   Skin over lesion #3  was prepped with Betadine and alcohol  and anesthetized with 1 cc of 2% lidocaine and epinephrine, using a 25-gauge 1 inch needle.  It was a black head - removed.  Band-Aid was applied with antibiotic ointment.   Tolerated well. Complications none.  #1-2 sent to path lab #3-4 discarded    Postprocedure instructions :    A Band-Aid should be  changed once or twice daily. You can take a shower tomorrow.  Keep the wounds clean. You can wash them with liquid soap and water. Pat dry with gauze or a Kleenex tissue before applying antibiotic ointment and a Band-Aid.   You need to report immediately  if fever, chills or any signs of infection develop.    The biopsy results should be available in 1 -2 weeks.   Lab Results  Component Value Date   WBC 11.1 (H) 11/15/2022   HGB 10.3 (L) 11/15/2022   HCT 30.9 (L) 11/15/2022   PLT 304 11/15/2022   GLUCOSE 89 12/23/2022   CHOL 160 10/12/2021   TRIG 93 10/12/2021   HDL 59 10/12/2021   LDLDIRECT 137.5 03/17/2009   LDLCALC 84 10/12/2021   ALT 15 11/15/2022   AST 26 11/15/2022   NA 135 12/23/2022   K 4.7 12/23/2022   CL 98 12/23/2022   CREATININE 1.07 (H) 12/23/2022   BUN 24 12/23/2022   CO2 26 12/23/2022   TSH 1.831 11/14/2022   INR 1.2 11/14/2022   HGBA1C 6.1 01/12/2022    ECHOCARDIOGRAM COMPLETE  Result Date: 11/15/2022    ECHOCARDIOGRAM REPORT   Patient Name:   Cindy Simpson Date of Exam: 11/15/2022 Medical Rec #:  [id number]       Height:       60.0 in Accession #:    [id number]       Weight:       145.1 lb Date of Birth:  02/24/1946      BSA:          1.628 m Patient Age:  76 years        BP:           124/49 mmHg Patient Gender: F               HR:           76 bpm. Exam Location:  Inpatient Procedure: 2D Echo, Cardiac Doppler and Color Doppler Indications:    syncope  History:        Patient has prior history of Echocardiogram examinations, most                 recent 03/07/2015. Arrythmias:V-Tach; Risk Factors:Hypertension                 and Dyslipidemia.  Sonographer:    Delcie Roch RDCS Referring Phys: [id number] ANASTASSIA DOUTOVA IMPRESSIONS  1. Left ventricular ejection fraction, by estimation, is 60 to 65%. The left ventricle has normal function. The left ventricle has no regional wall motion abnormalities. Left ventricular diastolic parameters were normal.  2. Right ventricular systolic function is normal. The right ventricular size is normal. There is normal pulmonary artery systolic pressure.  3. The mitral valve is normal in structure. Trivial mitral valve regurgitation. No evidence of mitral stenosis.  4. The aortic valve is tricuspid. Aortic valve regurgitation is not visualized. Aortic valve sclerosis is present, with no evidence of aortic valve stenosis.  5. The inferior vena cava is normal in size with greater than 50% respiratory variability, suggesting right atrial pressure of 3 mmHg. FINDINGS  Left Ventricle: Left ventricular ejection fraction, by estimation, is 60 to 65%. The left ventricle has normal function. The left ventricle has no regional wall motion abnormalities. The left ventricular internal cavity size was normal in size. There is  no left ventricular hypertrophy. Left ventricular diastolic parameters were normal. Right Ventricle: The right ventricular size is normal. Right ventricular systolic function is normal. There is normal pulmonary artery systolic pressure. The tricuspid regurgitant velocity is 2.71 m/s, and with an assumed right atrial pressure of 3  mmHg,  the estimated right ventricular systolic pressure is 32.4 mmHg. Left Atrium: Left atrial size was normal in size. Right Atrium: Right atrial size was normal in size. Pericardium: There is no evidence of pericardial effusion. Mitral Valve: The mitral valve is normal in structure. Trivial mitral valve regurgitation. No evidence of mitral valve stenosis. Tricuspid Valve: The tricuspid valve is normal in structure. Tricuspid valve regurgitation is mild . No evidence of tricuspid stenosis. Aortic Valve: The aortic valve is tricuspid. Aortic valve regurgitation is not visualized. Aortic valve sclerosis is present, with no evidence of aortic valve stenosis. Pulmonic Valve: The pulmonic valve was normal in structure. Pulmonic valve regurgitation is mild. No evidence of pulmonic stenosis. Aorta: The aortic root is normal in size and structure. Venous: The inferior vena cava is normal in size with greater than 50% respiratory variability, suggesting right atrial pressure of 3 mmHg. IAS/Shunts: No atrial level shunt detected by color flow Doppler.  LEFT VENTRICLE PLAX 2D LVIDd:         4.30 cm   Diastology LVIDs:         2.70 cm   LV e' medial:    8.59 cm/s LV PW:         0.90 cm   LV E/e' medial:  10.5 LV IVS:        0.90 cm   LV e' lateral:   11.10 cm/s LVOT diam:     1.80 cm  LV E/e' lateral: 8.1 LV SV:         54 LV SV Index:   33 LVOT Area:     2.54 cm  RIGHT VENTRICLE             IVC RV Basal diam:  2.50 cm     IVC diam: 1.30 cm RV S prime:     24.10 cm/s TAPSE (M-mode): 2.6 cm LEFT ATRIUM             Index        RIGHT ATRIUM           Index LA diam:        3.50 cm 2.15 cm/m   RA Area:     12.00 cm LA Vol (A2C):   36.4 ml 22.35 ml/m  RA Volume:   25.20 ml  15.47 ml/m LA Vol (A4C):   42.2 ml 25.91 ml/m LA Biplane Vol: 42.3 ml 25.98 ml/m  AORTIC VALVE LVOT Vmax:   95.40 cm/s LVOT Vmean:  63.400 cm/s LVOT VTI:    0.214 m  AORTA Ao Root diam: 2.90 cm Ao Asc diam:  3.30 cm MITRAL VALVE                TRICUSPID  VALVE MV Area (PHT): 3.65 cm     TR Peak grad:   29.4 mmHg MV Decel Time: 208 msec     TR Vmax:        271.00 cm/s MV E velocity: 90.10 cm/s MV A velocity: 119.00 cm/s  SHUNTS MV E/A ratio:  0.76         Systemic VTI:  0.21 m                             Systemic Diam: 1.80 cm Olga Millers MD Electronically signed by Olga Millers MD Signature Date/Time: 11/15/2022/12:09:29 PM    Final    CT Angio Chest PE W and/or Wo Contrast  Result Date: 11/14/2022 CLINICAL DATA:  Weakness EXAM: CT ANGIOGRAPHY CHEST WITH CONTRAST TECHNIQUE: Multidetector CT imaging of the chest was performed using the standard protocol during bolus administration of intravenous contrast. Multiplanar CT image reconstructions and MIPs were obtained to evaluate the vascular anatomy. RADIATION DOSE REDUCTION: This exam was performed according to the departmental dose-optimization program which includes automated exposure control, adjustment of the mA and/or kV according to patient size and/or use of iterative reconstruction technique. CONTRAST:  75mL OMNIPAQUE IOHEXOL 350 MG/ML SOLN COMPARISON:  CT 07/06/2018, 03/28/2019 FINDINGS: Cardiovascular: Satisfactory opacification of the pulmonary arteries to the segmental level. No evidence of pulmonary embolism. Mild aortic atherosclerosis. No aneurysm. Mild cardiomegaly. No pericardial effusion. Mediastinum/Nodes: Midline trachea. No thyroid mass. No suspicious lymph nodes. Moderate hiatal hernia. Lungs/Pleura: No consolidation or effusion. Diffuse bilateral mosaic attenuation. Upper Abdomen: No acute finding. Increased size of a cystic lesion in the upper pole of the right kidney, now measuring 6 cm with thin linear areas of enhancement, possibly enhancing septa. Musculoskeletal: No acute or suspicious osseous abnormality. Review of the MIP images confirms the above findings. IMPRESSION: 1. Negative for acute pulmonary embolus. 2. Diffuse bilateral mosaic attenuation of the lungs, could be  secondary to small vessel disease or small airways disease. 3. Increased size of a cystic lesion in the upper pole of the right kidney, now measuring 6 cm with thin linear areas of enhancement, possibly enhancing septa. When the patient is clinically stable and able to follow  directions and hold their breath (preferably as an outpatient) further evaluation with dedicated abdominal MRI should be considered. 4. Aortic atherosclerosis. Aortic Atherosclerosis (ICD10-I70.0). Electronically Signed   By: Jasmine Pang M.D.   On: 11/14/2022 19:40   DG Chest Port 1 View  Result Date: 11/14/2022 CLINICAL DATA:  Weakness. EXAM: PORTABLE CHEST 1 VIEW COMPARISON:  None Available. FINDINGS: The heart size and mediastinal contours are within normal limits. Both lungs are clear. The visualized skeletal structures are unremarkable. IMPRESSION: No active disease. Electronically Signed   By: Gerome Sam III M.D.   On: 11/14/2022 17:02    Assessment & Plan:   Problem List Items Addressed This Visit     Neoplasm of uncertain behavior of skin - Primary    See procedure note      Relevant Orders   Surgical pathology   Surgical pathology      No orders of the defined types were placed in this encounter.     Follow-up: No follow-ups on file.  [name], MD

## 2023-02-15 ENCOUNTER — Encounter: Payer: Self-pay | Admitting: Internal Medicine

## 2023-02-15 ENCOUNTER — Ambulatory Visit: Payer: Medicare Other | Admitting: Internal Medicine

## 2023-02-15 NOTE — Assessment & Plan Note (Signed)
See procedure note.

## 2023-02-17 DIAGNOSIS — J300 Vasomotor rhinitis: Secondary | ICD-10-CM | POA: Diagnosis not present

## 2023-04-14 ENCOUNTER — Encounter: Payer: Self-pay | Admitting: Podiatry

## 2023-04-14 ENCOUNTER — Ambulatory Visit: Payer: Medicare Other | Admitting: Podiatry

## 2023-04-14 VITALS — BP 122/80 | HR 66 | Temp 97.2°F | Resp 16 | Ht 61.0 in | Wt 136.0 lb

## 2023-04-14 DIAGNOSIS — B351 Tinea unguium: Secondary | ICD-10-CM

## 2023-04-14 DIAGNOSIS — M79676 Pain in unspecified toe(s): Secondary | ICD-10-CM

## 2023-04-14 DIAGNOSIS — L84 Corns and callosities: Secondary | ICD-10-CM | POA: Diagnosis not present

## 2023-04-14 NOTE — Progress Notes (Signed)
Subjective: Chief Complaint  Patient presents with   Foot Pain    RM11: patient is here for RFC: Hyperkeratotic lesions/preulcerative calluses, symptomatic onychomycosis    77 year old female presents the office today for evaluation of painful calluses as well as toenails.  She began some burning to the bottom of her feet and the balls of the foot.  She said the burning is associated with calluses but feels better after they are trimmed. She has tried some offloading techniques with felt padding and shoe inserts and that this has helped manage the pain.  No recent injuries.  No radiating pain.  Denies any open lesions.   Objective: AAO x3, NAD DP/PT pulses palpable bilaterally, CRT less than 3 seconds  Hyperkeratotic lesion submetatarsal 1, 3, 5 bilaterally.  No ongoing ulceration drainage or signs of infection.  There is no underlying ulceration drainage or any signs of infection. The nails are hypertrophic, dystrophic, discolored x 10 and are causing irritation/pain. No redness, swelling, drainage.  No open lesions or pre-ulcerative lesions.  Bunions present. Prominent metatarsal heads with atrophy of the fat pad.  No pain with calf compression, swelling, warmth, erythema  Assessment: Hyperkeratotic lesions/preulcerative calluses, symptomatic onychomycosis  Plan: -All treatment options discussed with the patient including all alternatives, risks, complications.  -Debrided the calluses x 6, pinpoint bleeding to left plantar 5th met head callus, antibiotic ointment and bandaid applied.  It was tender to trim the calluses on the right foot she had pain with this.  Continue moisturizer, offloading. -Nails debrided x10 without complications or bleeding. -Recommend continued offloading of the metatarsal heads to limit discomfort with the callus. The patient reports relief with this in the past -Discussed daily foot inspection  Return in about 3 months (around 07/14/2023) for Nails and Callus  RFC with [facility].   Bronwen Betters, DPM

## 2023-04-25 NOTE — Addendum Note (Signed)
Addended by: Barbaraann Share on: 04/25/2023 12:17 PM   Modules accepted: Level of Service

## 2023-05-04 ENCOUNTER — Ambulatory Visit: Payer: Medicare Other | Admitting: Internal Medicine

## 2023-05-04 ENCOUNTER — Encounter: Payer: Self-pay | Admitting: Internal Medicine

## 2023-05-04 VITALS — BP 118/80 | HR 67 | Temp 97.7°F | Ht 61.0 in | Wt 145.0 lb

## 2023-05-04 DIAGNOSIS — M25512 Pain in left shoulder: Secondary | ICD-10-CM

## 2023-05-04 DIAGNOSIS — Z23 Encounter for immunization: Secondary | ICD-10-CM | POA: Diagnosis not present

## 2023-05-04 DIAGNOSIS — I2510 Atherosclerotic heart disease of native coronary artery without angina pectoris: Secondary | ICD-10-CM

## 2023-05-04 DIAGNOSIS — K219 Gastro-esophageal reflux disease without esophagitis: Secondary | ICD-10-CM | POA: Diagnosis not present

## 2023-05-04 DIAGNOSIS — R739 Hyperglycemia, unspecified: Secondary | ICD-10-CM

## 2023-05-04 DIAGNOSIS — G8929 Other chronic pain: Secondary | ICD-10-CM | POA: Diagnosis not present

## 2023-05-04 DIAGNOSIS — E034 Atrophy of thyroid (acquired): Secondary | ICD-10-CM | POA: Diagnosis not present

## 2023-05-04 DIAGNOSIS — N183 Chronic kidney disease, stage 3 unspecified: Secondary | ICD-10-CM | POA: Diagnosis not present

## 2023-05-04 LAB — COMPREHENSIVE METABOLIC PANEL
ALT: 18 U/L (ref 0–35)
AST: 22 U/L (ref 0–37)
Albumin: 4.2 g/dL (ref 3.5–5.2)
Alkaline Phosphatase: 106 U/L (ref 39–117)
BUN: 21 mg/dL (ref 6–23)
CO2: 29 meq/L (ref 19–32)
Calcium: 9.7 mg/dL (ref 8.4–10.5)
Chloride: 98 meq/L (ref 96–112)
Creatinine, Ser: 1.06 mg/dL (ref 0.40–1.20)
GFR: 50.56 mL/min — ABNORMAL LOW (ref >60.00)
Glucose, Bld: 95 mg/dL (ref 70–99)
Potassium: 5 meq/L (ref 3.5–5.1)
Sodium: 133 meq/L — ABNORMAL LOW (ref 135–145)
Total Bilirubin: 0.6 mg/dL (ref 0.2–1.2)
Total Protein: 7.1 g/dL (ref 6.0–8.3)

## 2023-05-04 LAB — CBC WITH DIFFERENTIAL/PLATELET
Basophils Absolute: 0.1 10*3/uL (ref 0.0–0.1)
Basophils Relative: 0.9 % (ref 0.0–3.0)
Eosinophils Absolute: 0.2 10*3/uL (ref 0.0–0.7)
Eosinophils Relative: 2.3 % (ref 0.0–5.0)
HCT: 37.3 % (ref 36.0–46.0)
Hemoglobin: 12.1 g/dL (ref 12.0–15.0)
Lymphocytes Relative: 25.4 % (ref 12.0–46.0)
Lymphs Abs: 1.9 10*3/uL (ref 0.7–4.0)
MCHC: 32.6 g/dL (ref 30.0–36.0)
MCV: 94.7 fL (ref 78.0–100.0)
Monocytes Absolute: 0.8 10*3/uL (ref 0.1–1.0)
Monocytes Relative: 10.2 % (ref 3.0–12.0)
Neutro Abs: 4.5 10*3/uL (ref 1.4–7.7)
Neutrophils Relative %: 61.2 % (ref 43.0–77.0)
Platelets: 348 10*3/uL (ref 150.0–400.0)
RBC: 3.94 Mil/uL (ref 3.87–5.11)
RDW: 13.5 % (ref 11.5–15.5)
WBC: 7.4 10*3/uL (ref 4.0–10.5)

## 2023-05-04 MED ORDER — METHYLPREDNISOLONE ACETATE 40 MG/ML IJ SUSP
40.0000 mg | Freq: Once | INTRAMUSCULAR | Status: AC
Start: 2023-05-04 — End: 2023-05-04
  Administered 2023-05-04: 40 mg via INTRAMUSCULAR

## 2023-05-04 NOTE — Addendum Note (Signed)
Addended by: Delsa Grana R on: 05/04/2023 01:15 PM   Modules accepted: Orders

## 2023-05-04 NOTE — Assessment & Plan Note (Signed)
I gave the patient Pepcid to take with water

## 2023-05-04 NOTE — Assessment & Plan Note (Signed)
Check A1c 

## 2023-05-04 NOTE — Patient Instructions (Signed)
Postprocedure instructions :    A Band-Aid should be left on for 12 hours. Injection therapy is not a cure itself. It is used in conjunction with other modalities. You can use nonsteroidal anti-inflammatories like ibuprofen , hot and cold compresses. Rest is recommended in the next 24 hours. You need to report immediately  if fever, chills or any signs of infection develop.

## 2023-05-04 NOTE — Assessment & Plan Note (Signed)
Monitor GFR Hydrate well

## 2023-05-04 NOTE — Assessment & Plan Note (Signed)
Chronic On Levothroid -

## 2023-05-04 NOTE — Progress Notes (Signed)
Subjective:  Patient ID: Cindy Simpson, female    DOB: [birth year]  Age: 77 y.o. MRN: [id number]  CC: Follow-up (3 MNTH F/U)   HPI Cindy Simpson presents for L shoulder pain x1 year after a COVID booster 1 year ago (L handed) F/u GERD, HTN  Outpatient Medications Prior to Visit  Medication Sig Dispense Refill   Aspirin 81 MG CAPS Take 1 tablet by mouth daily.     atorvastatin (LIPITOR) 20 MG tablet Take 1 tablet (20 mg total) by mouth daily. 90 tablet 3   bismuth subsalicylate (PEPTO BISMOL) 262 MG/15ML suspension Take 30 mLs by mouth every 6 (six) hours as needed for indigestion.     carvedilol (COREG) 6.25 MG tablet TAKE 1 TABLET BY MOUTH TWICE  DAILY WITH A MEAL. ANNUAL  APPOINTMENT DUE IN JULY MUST SEE PROVIDER FOR FUTURE REFILLS 200 tablet 2   cetirizine (ZYRTEC) 10 MG tablet Take 10 mg by mouth at bedtime.     Cholecalciferol 1000 UNITS tablet Take 1,000 Units by mouth daily after supper.      Continuous Blood Gluc Receiver (FREESTYLE LIBRE 2 READER) DEVI 1 Units by Does not apply route every 14 (fourteen) days. 1 each 0   Continuous Blood Gluc Sensor (FREESTYLE LIBRE 2 SENSOR) MISC 1 Units by Does not apply route every 14 (fourteen) days. 6 each 3   diclofenac sodium (VOLTAREN) 1 % GEL Apply 2 g topically 4 (four) times daily. (Patient taking differently: Apply 2 g topically daily as needed (for pain).) 100 g 3   FLUZONE HIGH-DOSE QUADRIVALENT 0.7 ML SUSY      glucose blood (ACCU-CHEK AVIVA) test strip Use to check blood sugars twice a day 100 each 5   Lancets (ACCU-CHEK SOFT TOUCH) lancets Use to check blood sugars twice a day 100 each 5   levothyroxine (SYNTHROID) 100 MCG tablet Take 100 mcg by mouth at bedtime.     LORazepam (ATIVAN) 0.5 MG tablet Take 1 tablet (0.5 mg total) by mouth every 6 (six) hours as needed for anxiety (anxiety or panic feeling). 30 tablet 0   pantoprazole (PROTONIX) 40 MG tablet TAKE 1 TABLET BY MOUTH TWICE  DAILY 200 tablet 2   valsartan (DIOVAN)  80 MG tablet Take 80 mg by mouth daily.     carvedilol (COREG) 3.125 MG tablet Take 1 tablet (3.125 mg total) by mouth 2 (two) times daily. 30 tablet 1   No facility-administered medications prior to visit.    ROS: Review of Systems  Constitutional:  Negative for activity change, appetite change, chills, fatigue and unexpected weight change.  HENT:  Negative for congestion, mouth sores and sinus pressure.   Eyes:  Negative for visual disturbance.  Respiratory:  Negative for cough and chest tightness.   Gastrointestinal:  Negative for abdominal pain and nausea.  Genitourinary:  Negative for difficulty urinating, frequency and vaginal pain.  Musculoskeletal:  Positive for arthralgias. Negative for back pain and gait problem.  Skin:  Negative for pallor and rash.  Neurological:  Negative for dizziness, tremors, weakness, numbness and headaches.  Psychiatric/Behavioral:  Negative for confusion and sleep disturbance.     Objective:  BP 118/80 (BP Location: Left Arm, Patient Position: Sitting, Cuff Size: Normal)   Pulse 67   Temp 97.7 F (36.5 C) (Oral)   Ht 5\' 1"  (1.549 m)   Wt 145 lb (65.8 kg)   SpO2 97%   BMI 27.40 kg/m   BP Readings from Last 3 Encounters:  05/04/23 118/80  Subjective:  Patient ID: Cindy Simpson, female    DOB: [birth year]  Age: 77 y.o. MRN: [id number]  CC: Follow-up (3 [facility] F/U)   HPI Cindy Simpson presents for L shoulder pain x1 year after a COVID booster 1 year ago (L handed) F/u GERD, HTN  Outpatient Medications Prior to Visit  Medication Sig Dispense Refill   Aspirin 81 MG CAPS Take 1 tablet by mouth daily.     atorvastatin (LIPITOR) 20 MG tablet Take 1 tablet (20 mg total) by mouth daily. 90 tablet 3   bismuth subsalicylate (PEPTO BISMOL) 262 MG/15ML suspension Take 30 mLs by mouth every 6 (six) hours as needed for indigestion.     carvedilol (COREG) 6.25 MG tablet TAKE 1 TABLET BY MOUTH TWICE  DAILY WITH A MEAL. ANNUAL  APPOINTMENT DUE IN JULY MUST SEE PROVIDER FOR FUTURE REFILLS 200 tablet 2   cetirizine (ZYRTEC) 10 MG tablet Take 10 mg by mouth at bedtime.     Cholecalciferol 1000 UNITS tablet Take 1,000 Units by mouth daily after supper.      Continuous Blood Gluc Receiver (FREESTYLE LIBRE 2 READER) DEVI 1 Units by Does not apply route every 14 (fourteen) days. 1 each 0   Continuous Blood Gluc Sensor (FREESTYLE LIBRE 2 SENSOR) MISC 1 Units by Does not apply route every 14 (fourteen) days. 6 each 3   diclofenac sodium (VOLTAREN) 1 % GEL Apply 2 g topically 4 (four) times daily. (Patient taking differently: Apply 2 g topically daily as needed (for pain).) 100 g 3   FLUZONE HIGH-DOSE QUADRIVALENT 0.7 ML SUSY      glucose blood (ACCU-CHEK AVIVA) test strip Use to check blood sugars twice a day 100 each 5   Lancets (ACCU-CHEK SOFT TOUCH) lancets Use to check blood sugars twice a day 100 each 5   levothyroxine (SYNTHROID) 100 MCG tablet Take 100 mcg by mouth at bedtime.     LORazepam (ATIVAN) 0.5 MG tablet Take 1 tablet (0.5 mg total) by mouth every 6 (six) hours as needed for anxiety (anxiety or panic feeling). 30 tablet 0   pantoprazole (PROTONIX) 40 MG tablet TAKE 1 TABLET BY MOUTH TWICE  DAILY 200 tablet 2   valsartan (DIOVAN)  80 MG tablet Take 80 mg by mouth daily.     carvedilol (COREG) 3.125 MG tablet Take 1 tablet (3.125 mg total) by mouth 2 (two) times daily. 30 tablet 1   No facility-administered medications prior to visit.    ROS: Review of Systems  Constitutional:  Negative for activity change, appetite change, chills, fatigue and unexpected weight change.  HENT:  Negative for congestion, mouth sores and sinus pressure.   Eyes:  Negative for visual disturbance.  Respiratory:  Negative for cough and chest tightness.   Gastrointestinal:  Negative for abdominal pain and nausea.  Genitourinary:  Negative for difficulty urinating, frequency and vaginal pain.  Musculoskeletal:  Positive for arthralgias. Negative for back pain and gait problem.  Skin:  Negative for pallor and rash.  Neurological:  Negative for dizziness, tremors, weakness, numbness and headaches.  Psychiatric/Behavioral:  Negative for confusion and sleep disturbance.     Objective:  BP 118/80 (BP Location: Left Arm, Patient Position: Sitting, Cuff Size: Normal)   Pulse 67   Temp 97.7 F (36.5 C) (Oral)   Ht 5\' 1"  (1.549 m)   Wt 145 lb (65.8 kg)   SpO2 97%   BMI 27.40 kg/m   BP Readings from Last 3 Encounters:  05/04/23 118/80  GLUCOSE 89 12/23/2022   CHOL 160 10/12/2021   TRIG 93 10/12/2021   HDL 59 10/12/2021   LDLDIRECT 137.5 03/17/2009   LDLCALC 84 10/12/2021   ALT 15 11/15/2022   AST 26 11/15/2022   NA 135 12/23/2022   K 4.7 12/23/2022   CL 98 12/23/2022   CREATININE 1.07 (H) 12/23/2022   BUN 24 12/23/2022   CO2 26 12/23/2022   TSH 1.831 11/14/2022   INR 1.2 11/14/2022   HGBA1C 6.1 01/12/2022    ECHOCARDIOGRAM COMPLETE  Result Date: 11/15/2022    ECHOCARDIOGRAM REPORT   Patient Name:   KYRRA PRADA Date of Exam: 11/15/2022 Medical Rec #:  [id number]       Height:       60.0 in Accession #:    [id number]      Weight:       145.1 lb Date of Birth:  28-Jan-1946      BSA:          1.628 m Patient Age:    76 years        BP:           124/49 mmHg Patient Gender: F               HR:           76 bpm. Exam Location:  Inpatient Procedure: 2D Echo, Cardiac Doppler and Color Doppler Indications:    syncope  History:        Patient has prior history of Echocardiogram examinations, most                 recent 03/07/2015. Arrythmias:V-Tach; Risk Factors:Hypertension                 and Dyslipidemia.  Sonographer:    Delcie Roch RDCS Referring Phys: [id number] ANASTASSIA DOUTOVA IMPRESSIONS  1. Left ventricular ejection fraction, by estimation, is 60 to 65%. The left ventricle has normal function. The left ventricle has no regional wall motion abnormalities. Left ventricular diastolic parameters were normal.  2. Right ventricular systolic function is normal. The right ventricular size is normal. There is normal pulmonary artery systolic pressure.  3. The mitral valve is normal in structure. Trivial mitral valve regurgitation. No evidence of mitral  stenosis.  4. The aortic valve is tricuspid. Aortic valve regurgitation is not visualized. Aortic valve sclerosis is present, with no evidence of aortic valve stenosis.  5. The inferior vena cava is normal in size with greater than 50% respiratory variability, suggesting right atrial pressure of 3 mmHg. FINDINGS  Left Ventricle: Left ventricular ejection fraction, by estimation, is 60 to 65%. The left ventricle has normal function. The left ventricle has no regional wall motion abnormalities. The left ventricular internal cavity size was normal in size. There is  no left ventricular hypertrophy. Left ventricular diastolic parameters were normal. Right Ventricle: The right ventricular size is normal. Right ventricular systolic function is normal. There is normal pulmonary artery systolic pressure. The tricuspid regurgitant velocity is 2.71 m/s, and with an assumed right atrial pressure of 3 mmHg,  the estimated right ventricular systolic pressure is 32.4 mmHg. Left Atrium: Left atrial size was normal in size. Right Atrium: Right atrial size was normal in size. Pericardium: There is no evidence of pericardial effusion. Mitral Valve: The mitral valve is normal in structure. Trivial mitral valve regurgitation. No evidence of mitral valve stenosis. Tricuspid Valve: The tricuspid valve is normal in structure. Tricuspid valve regurgitation is mild . No evidence  GLUCOSE 89 12/23/2022   CHOL 160 10/12/2021   TRIG 93 10/12/2021   HDL 59 10/12/2021   LDLDIRECT 137.5 03/17/2009   LDLCALC 84 10/12/2021   ALT 15 11/15/2022   AST 26 11/15/2022   NA 135 12/23/2022   K 4.7 12/23/2022   CL 98 12/23/2022   CREATININE 1.07 (H) 12/23/2022   BUN 24 12/23/2022   CO2 26 12/23/2022   TSH 1.831 11/14/2022   INR 1.2 11/14/2022   HGBA1C 6.1 01/12/2022    ECHOCARDIOGRAM COMPLETE  Result Date: 11/15/2022    ECHOCARDIOGRAM REPORT   Patient Name:   KYRRA PRADA Date of Exam: 11/15/2022 Medical Rec #:  [id number]       Height:       60.0 in Accession #:    [id number]      Weight:       145.1 lb Date of Birth:  28-Jan-1946      BSA:          1.628 m Patient Age:    76 years        BP:           124/49 mmHg Patient Gender: F               HR:           76 bpm. Exam Location:  Inpatient Procedure: 2D Echo, Cardiac Doppler and Color Doppler Indications:    syncope  History:        Patient has prior history of Echocardiogram examinations, most                 recent 03/07/2015. Arrythmias:V-Tach; Risk Factors:Hypertension                 and Dyslipidemia.  Sonographer:    Delcie Roch RDCS Referring Phys: [id number] ANASTASSIA DOUTOVA IMPRESSIONS  1. Left ventricular ejection fraction, by estimation, is 60 to 65%. The left ventricle has normal function. The left ventricle has no regional wall motion abnormalities. Left ventricular diastolic parameters were normal.  2. Right ventricular systolic function is normal. The right ventricular size is normal. There is normal pulmonary artery systolic pressure.  3. The mitral valve is normal in structure. Trivial mitral valve regurgitation. No evidence of mitral  stenosis.  4. The aortic valve is tricuspid. Aortic valve regurgitation is not visualized. Aortic valve sclerosis is present, with no evidence of aortic valve stenosis.  5. The inferior vena cava is normal in size with greater than 50% respiratory variability, suggesting right atrial pressure of 3 mmHg. FINDINGS  Left Ventricle: Left ventricular ejection fraction, by estimation, is 60 to 65%. The left ventricle has normal function. The left ventricle has no regional wall motion abnormalities. The left ventricular internal cavity size was normal in size. There is  no left ventricular hypertrophy. Left ventricular diastolic parameters were normal. Right Ventricle: The right ventricular size is normal. Right ventricular systolic function is normal. There is normal pulmonary artery systolic pressure. The tricuspid regurgitant velocity is 2.71 m/s, and with an assumed right atrial pressure of 3 mmHg,  the estimated right ventricular systolic pressure is 32.4 mmHg. Left Atrium: Left atrial size was normal in size. Right Atrium: Right atrial size was normal in size. Pericardium: There is no evidence of pericardial effusion. Mitral Valve: The mitral valve is normal in structure. Trivial mitral valve regurgitation. No evidence of mitral valve stenosis. Tricuspid Valve: The tricuspid valve is normal in structure. Tricuspid valve regurgitation is mild . No evidence  Subjective:  Patient ID: Cindy Simpson, female    DOB: [birth year]  Age: 77 y.o. MRN: [id number]  CC: Follow-up (3 MNTH F/U)   HPI Cindy Simpson presents for L shoulder pain x1 year after a COVID booster 1 year ago (L handed) F/u GERD, HTN  Outpatient Medications Prior to Visit  Medication Sig Dispense Refill   Aspirin 81 MG CAPS Take 1 tablet by mouth daily.     atorvastatin (LIPITOR) 20 MG tablet Take 1 tablet (20 mg total) by mouth daily. 90 tablet 3   bismuth subsalicylate (PEPTO BISMOL) 262 MG/15ML suspension Take 30 mLs by mouth every 6 (six) hours as needed for indigestion.     carvedilol (COREG) 6.25 MG tablet TAKE 1 TABLET BY MOUTH TWICE  DAILY WITH A MEAL. ANNUAL  APPOINTMENT DUE IN JULY MUST SEE PROVIDER FOR FUTURE REFILLS 200 tablet 2   cetirizine (ZYRTEC) 10 MG tablet Take 10 mg by mouth at bedtime.     Cholecalciferol 1000 UNITS tablet Take 1,000 Units by mouth daily after supper.      Continuous Blood Gluc Receiver (FREESTYLE LIBRE 2 READER) DEVI 1 Units by Does not apply route every 14 (fourteen) days. 1 each 0   Continuous Blood Gluc Sensor (FREESTYLE LIBRE 2 SENSOR) MISC 1 Units by Does not apply route every 14 (fourteen) days. 6 each 3   diclofenac sodium (VOLTAREN) 1 % GEL Apply 2 g topically 4 (four) times daily. (Patient taking differently: Apply 2 g topically daily as needed (for pain).) 100 g 3   FLUZONE HIGH-DOSE QUADRIVALENT 0.7 ML SUSY      glucose blood (ACCU-CHEK AVIVA) test strip Use to check blood sugars twice a day 100 each 5   Lancets (ACCU-CHEK SOFT TOUCH) lancets Use to check blood sugars twice a day 100 each 5   levothyroxine (SYNTHROID) 100 MCG tablet Take 100 mcg by mouth at bedtime.     LORazepam (ATIVAN) 0.5 MG tablet Take 1 tablet (0.5 mg total) by mouth every 6 (six) hours as needed for anxiety (anxiety or panic feeling). 30 tablet 0   pantoprazole (PROTONIX) 40 MG tablet TAKE 1 TABLET BY MOUTH TWICE  DAILY 200 tablet 2   valsartan (DIOVAN)  80 MG tablet Take 80 mg by mouth daily.     carvedilol (COREG) 3.125 MG tablet Take 1 tablet (3.125 mg total) by mouth 2 (two) times daily. 30 tablet 1   No facility-administered medications prior to visit.    ROS: Review of Systems  Constitutional:  Negative for activity change, appetite change, chills, fatigue and unexpected weight change.  HENT:  Negative for congestion, mouth sores and sinus pressure.   Eyes:  Negative for visual disturbance.  Respiratory:  Negative for cough and chest tightness.   Gastrointestinal:  Negative for abdominal pain and nausea.  Genitourinary:  Negative for difficulty urinating, frequency and vaginal pain.  Musculoskeletal:  Positive for arthralgias. Negative for back pain and gait problem.  Skin:  Negative for pallor and rash.  Neurological:  Negative for dizziness, tremors, weakness, numbness and headaches.  Psychiatric/Behavioral:  Negative for confusion and sleep disturbance.     Objective:  BP 118/80 (BP Location: Left Arm, Patient Position: Sitting, Cuff Size: Normal)   Pulse 67   Temp 97.7 F (36.5 C) (Oral)   Ht 5\' 1"  (1.549 m)   Wt 145 lb (65.8 kg)   SpO2 97%   BMI 27.40 kg/m   BP Readings from Last 3 Encounters:  05/04/23 118/80

## 2023-05-04 NOTE — Assessment & Plan Note (Addendum)
L shoulder w/pain - biceps  tendon L shoulder pain x1 year after a COVID booster 1 year ago Will inject

## 2023-05-05 ENCOUNTER — Encounter: Payer: Self-pay | Admitting: Internal Medicine

## 2023-05-05 DIAGNOSIS — Z1231 Encounter for screening mammogram for malignant neoplasm of breast: Secondary | ICD-10-CM | POA: Diagnosis not present

## 2023-05-05 LAB — HM MAMMOGRAPHY

## 2023-05-07 ENCOUNTER — Encounter: Payer: Self-pay | Admitting: Internal Medicine

## 2023-07-07 ENCOUNTER — Ambulatory Visit: Payer: Medicare Other | Admitting: Podiatry

## 2023-07-07 ENCOUNTER — Encounter: Payer: Self-pay | Admitting: Podiatry

## 2023-07-07 DIAGNOSIS — M79675 Pain in left toe(s): Secondary | ICD-10-CM

## 2023-07-07 DIAGNOSIS — E1149 Type 2 diabetes mellitus with other diabetic neurological complication: Secondary | ICD-10-CM

## 2023-07-07 DIAGNOSIS — B351 Tinea unguium: Secondary | ICD-10-CM

## 2023-07-07 DIAGNOSIS — M79674 Pain in right toe(s): Secondary | ICD-10-CM | POA: Diagnosis not present

## 2023-07-07 DIAGNOSIS — L84 Corns and callosities: Secondary | ICD-10-CM

## 2023-07-09 NOTE — Progress Notes (Signed)
Subjective: Chief Complaint  Patient presents with   RFC    RM#13 RFC/ CALLUSES    77 year old female presents the office today for evaluation of painful calluses as well as toenails.  No recent injuries.  No radiating pain.  Denies any open lesions.   Objective: AAO x3, NAD DP/PT pulses palpable bilaterally, CRT less than 3 seconds  Hyperkeratotic lesion submetatarsal 1, 3, 5 bilaterally.  No ongoing ulceration drainage or signs of infection.  There is no underlying ulceration drainage or any signs of infection. The nails are hypertrophic, dystrophic, discolored x 10 and are causing irritation/pain. No redness, swelling, drainage.  No open lesions or pre-ulcerative lesions.  Bunions present. Prominent metatarsal heads with atrophy of the fat pad.  There is no area pinpoint tenderness or palpable neuroma.  No edema. No pain with calf compression, swelling, warmth, erythema  Assessment: Hyperkeratotic lesions/preulcerative calluses, symptomatic onychomycosis  Plan: -All treatment options discussed with the patient including all alternatives, risks, complications.  -Debrided the calluses x 6 without complications or bleeding.  It was tender to trim the calluses on the right foot she had pain with this.  Continue moisturizer, offloading. -Nails debrided x10 without complications or bleeding. -Given the prominence of metatarsal heads continue with metatarsal support, offloading. -Discussed daily foot inspection  Return in about 3 months (around 10/05/2023).   Vivi Barrack DPM

## 2023-07-11 DIAGNOSIS — H26493 Other secondary cataract, bilateral: Secondary | ICD-10-CM | POA: Diagnosis not present

## 2023-07-11 DIAGNOSIS — H35033 Hypertensive retinopathy, bilateral: Secondary | ICD-10-CM | POA: Diagnosis not present

## 2023-07-11 DIAGNOSIS — Z961 Presence of intraocular lens: Secondary | ICD-10-CM | POA: Diagnosis not present

## 2023-07-11 DIAGNOSIS — H04123 Dry eye syndrome of bilateral lacrimal glands: Secondary | ICD-10-CM | POA: Diagnosis not present

## 2023-07-25 ENCOUNTER — Other Ambulatory Visit: Payer: Self-pay | Admitting: Internal Medicine

## 2023-08-04 ENCOUNTER — Encounter: Payer: Self-pay | Admitting: Internal Medicine

## 2023-08-04 ENCOUNTER — Ambulatory Visit: Payer: Medicare Other | Admitting: Internal Medicine

## 2023-08-04 VITALS — BP 122/76 | HR 65 | Temp 97.8°F | Ht 61.0 in | Wt 142.0 lb

## 2023-08-04 DIAGNOSIS — M25512 Pain in left shoulder: Secondary | ICD-10-CM

## 2023-08-04 DIAGNOSIS — G8929 Other chronic pain: Secondary | ICD-10-CM | POA: Diagnosis not present

## 2023-08-04 DIAGNOSIS — M545 Low back pain, unspecified: Secondary | ICD-10-CM

## 2023-08-04 DIAGNOSIS — N183 Chronic kidney disease, stage 3 unspecified: Secondary | ICD-10-CM | POA: Diagnosis not present

## 2023-08-04 DIAGNOSIS — I10 Essential (primary) hypertension: Secondary | ICD-10-CM | POA: Diagnosis not present

## 2023-08-04 NOTE — Assessment & Plan Note (Signed)
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

## 2023-08-04 NOTE — Assessment & Plan Note (Signed)
Chronic On Coreg, Diovan 

## 2023-08-04 NOTE — Assessment & Plan Note (Signed)
Monitor GFR Hydrate well 

## 2023-08-04 NOTE — Progress Notes (Signed)
Subjective:  Patient ID: Cindy Simpson, female    DOB: Jan 20, 1946  Age: 78 y.o. MRN: [id number]  CC: Medical Management of Chronic Issues (3 month follow up)   HPI Cindy Simpson presents for LBP, OA, HTN  Worsening stiffness in the L shoulder, L handed   Outpatient Medications Prior to Visit  Medication Sig Dispense Refill   Aspirin 81 MG CAPS Take 1 tablet by mouth daily.     atorvastatin (LIPITOR) 20 MG tablet Take 1 tablet (20 mg total) by mouth daily. 90 tablet 3   bismuth subsalicylate (PEPTO BISMOL) 262 MG/15ML suspension Take 30 mLs by mouth every 6 (six) hours as needed for indigestion.     carvedilol (COREG) 6.25 MG tablet TAKE 1 TABLET BY MOUTH TWICE  DAILY WITH A MEAL. ANNUAL  APPOINTMENT DUE IN JULY MUST SEE PROVIDER FOR FUTURE REFILLS 200 tablet 2   cetirizine (ZYRTEC) 10 MG tablet Take 10 mg by mouth at bedtime.     Cholecalciferol 1000 UNITS tablet Take 1,000 Units by mouth daily after supper.      Continuous Blood Gluc Receiver (FREESTYLE LIBRE 2 READER) DEVI 1 Units by Does not apply route every 14 (fourteen) days. 1 each 0   Continuous Blood Gluc Sensor (FREESTYLE LIBRE 2 SENSOR) MISC 1 Units by Does not apply route every 14 (fourteen) days. 6 each 3   diclofenac sodium (VOLTAREN) 1 % GEL Apply 2 g topically 4 (four) times daily. (Patient taking differently: Apply 2 g topically daily as needed (for pain).) 100 g 3   FLUZONE HIGH-DOSE QUADRIVALENT 0.7 ML SUSY      glucose blood (ACCU-CHEK AVIVA) test strip Use to check blood sugars twice a day 100 each 5   Lancets (ACCU-CHEK SOFT TOUCH) lancets Use to check blood sugars twice a day 100 each 5   levothyroxine (SYNTHROID) 100 MCG tablet Take 100 mcg by mouth at bedtime.     LORazepam (ATIVAN) 0.5 MG tablet Take 1 tablet (0.5 mg total) by mouth every 6 (six) hours as needed for anxiety (anxiety or panic feeling). 30 tablet 0   pantoprazole (PROTONIX) 40 MG tablet TAKE 1 TABLET BY MOUTH TWICE  DAILY 200 tablet 2    valsartan (DIOVAN) 80 MG tablet TAKE 1 TABLET BY MOUTH DAILY 100 tablet 2   carvedilol (COREG) 3.125 MG tablet Take 1 tablet (3.125 mg total) by mouth 2 (two) times daily. 30 tablet 1   No facility-administered medications prior to visit.    ROS: Review of Systems  Constitutional:  Negative for activity change, appetite change, chills, fatigue and unexpected weight change.  HENT:  Negative for congestion, mouth sores and sinus pressure.   Eyes:  Negative for visual disturbance.  Respiratory:  Negative for cough and chest tightness.   Gastrointestinal:  Negative for abdominal pain and nausea.  Genitourinary:  Negative for difficulty urinating, frequency and vaginal pain.  Musculoskeletal:  Positive for arthralgias and back pain. Negative for gait problem.  Skin:  Negative for pallor and rash.  Neurological:  Negative for dizziness, tremors, weakness, numbness and headaches.  Psychiatric/Behavioral:  Negative for confusion and sleep disturbance.     Objective:  BP 122/76 (BP Location: Right Arm, Patient Position: Sitting, Cuff Size: Normal)   Pulse 65   Temp 97.8 F (36.6 C) (Oral)   Ht 5\' 1"  (1.549 m)   Wt 142 lb (64.4 kg)   SpO2 95%   BMI 26.83 kg/m   BP Readings from Last 3 Encounters:  08/04/23 122/76  05/04/23 118/80  04/14/23 122/80    Wt Readings from Last 3 Encounters:  08/04/23 142 lb (64.4 kg)  05/04/23 145 lb (65.8 kg)  04/14/23 136 lb (61.7 kg)    Physical Exam Constitutional:      General: She is not in acute distress.    Appearance: She is well-developed.  HENT:     Head: Normocephalic.     Right Ear: External ear normal.     Left Ear: External ear normal.     Nose: Nose normal.  Eyes:     General:        Right eye: No discharge.        Left eye: No discharge.     Conjunctiva/sclera: Conjunctivae normal.     Pupils: Pupils are equal, round, and reactive to light.  Neck:     Thyroid: No thyromegaly.     Vascular: No JVD.     Trachea: No tracheal  deviation.  Cardiovascular:     Rate and Rhythm: Normal rate and regular rhythm.     Heart sounds: Normal heart sounds.  Pulmonary:     Effort: No respiratory distress.     Breath sounds: No stridor. No wheezing.  Abdominal:     General: Bowel sounds are normal. There is no distension.     Palpations: Abdomen is soft. There is no mass.     Tenderness: There is no abdominal tenderness. There is no guarding or rebound.  Musculoskeletal:        General: Tenderness present.     Cervical back: Normal range of motion and neck supple. No rigidity.  Lymphadenopathy:     Cervical: No cervical adenopathy.  Skin:    Findings: No erythema or rash.  Neurological:     Cranial Nerves: No cranial nerve deficit.     Motor: No abnormal muscle tone.     Coordination: Coordination normal.     Deep Tendon Reflexes: Reflexes normal.  Psychiatric:        Behavior: Behavior normal.        Thought Content: Thought content normal.        Judgment: Judgment normal.   L shoulder w/pain, decreased ROM LS w/pain   Lab Results  Component Value Date   WBC 7.4 05/04/2023   HGB 12.1 05/04/2023   HCT 37.3 05/04/2023   PLT 348.0 05/04/2023   GLUCOSE 95 05/04/2023   CHOL 160 10/12/2021   TRIG 93 10/12/2021   HDL 59 10/12/2021   LDLDIRECT 137.5 03/17/2009   LDLCALC 84 10/12/2021   ALT 18 05/04/2023   AST 22 05/04/2023   NA 133 (L) 05/04/2023   K 5.0 05/04/2023   CL 98 05/04/2023   CREATININE 1.06 05/04/2023   BUN 21 05/04/2023   CO2 29 05/04/2023   TSH 1.831 11/14/2022   INR 1.2 11/14/2022   HGBA1C 6.1 01/12/2022    ECHOCARDIOGRAM COMPLETE Result Date: 11/15/2022    ECHOCARDIOGRAM REPORT   Patient Name:   Cindy Simpson Date of Exam: 11/15/2022 Medical Rec #:  [id number]       Height:       60.0 in Accession #:    [id number]      Weight:       145.1 lb Date of Birth:  05-05-1946      BSA:          1.628 m Patient Age:    76 years        BP:  124/49 mmHg Patient Gender: F                HR:           76 bpm. Exam Location:  Inpatient Procedure: 2D Echo, Cardiac Doppler and Color Doppler Indications:    syncope  History:        Patient has prior history of Echocardiogram examinations, most                 recent 03/07/2015. Arrythmias:V-Tach; Risk Factors:Hypertension                 and Dyslipidemia.  Sonographer:    Delcie Roch RDCS Referring Phys: [id number] ANASTASSIA DOUTOVA IMPRESSIONS  1. Left ventricular ejection fraction, by estimation, is 60 to 65%. The left ventricle has normal function. The left ventricle has no regional wall motion abnormalities. Left ventricular diastolic parameters were normal.  2. Right ventricular systolic function is normal. The right ventricular size is normal. There is normal pulmonary artery systolic pressure.  3. The mitral valve is normal in structure. Trivial mitral valve regurgitation. No evidence of mitral stenosis.  4. The aortic valve is tricuspid. Aortic valve regurgitation is not visualized. Aortic valve sclerosis is present, with no evidence of aortic valve stenosis.  5. The inferior vena cava is normal in size with greater than 50% respiratory variability, suggesting right atrial pressure of 3 mmHg. FINDINGS  Left Ventricle: Left ventricular ejection fraction, by estimation, is 60 to 65%. The left ventricle has normal function. The left ventricle has no regional wall motion abnormalities. The left ventricular internal cavity size was normal in size. There is  no left ventricular hypertrophy. Left ventricular diastolic parameters were normal. Right Ventricle: The right ventricular size is normal. Right ventricular systolic function is normal. There is normal pulmonary artery systolic pressure. The tricuspid regurgitant velocity is 2.71 m/s, and with an assumed right atrial pressure of 3 mmHg,  the estimated right ventricular systolic pressure is 32.4 mmHg. Left Atrium: Left atrial size was normal in size. Right Atrium: Right atrial size was normal in  size. Pericardium: There is no evidence of pericardial effusion. Mitral Valve: The mitral valve is normal in structure. Trivial mitral valve regurgitation. No evidence of mitral valve stenosis. Tricuspid Valve: The tricuspid valve is normal in structure. Tricuspid valve regurgitation is mild . No evidence of tricuspid stenosis. Aortic Valve: The aortic valve is tricuspid. Aortic valve regurgitation is not visualized. Aortic valve sclerosis is present, with no evidence of aortic valve stenosis. Pulmonic Valve: The pulmonic valve was normal in structure. Pulmonic valve regurgitation is mild. No evidence of pulmonic stenosis. Aorta: The aortic root is normal in size and structure. Venous: The inferior vena cava is normal in size with greater than 50% respiratory variability, suggesting right atrial pressure of 3 mmHg. IAS/Shunts: No atrial level shunt detected by color flow Doppler.  LEFT VENTRICLE PLAX 2D LVIDd:         4.30 cm   Diastology LVIDs:         2.70 cm   LV e' medial:    8.59 cm/s LV PW:         0.90 cm   LV E/e' medial:  10.5 LV IVS:        0.90 cm   LV e' lateral:   11.10 cm/s LVOT diam:     1.80 cm   LV E/e' lateral: 8.1 LV SV:         54 LV SV Index:  33 LVOT Area:     2.54 cm  RIGHT VENTRICLE             IVC RV Basal diam:  2.50 cm     IVC diam: 1.30 cm RV S prime:     24.10 cm/s TAPSE (M-mode): 2.6 cm LEFT ATRIUM             Index        RIGHT ATRIUM           Index LA diam:        3.50 cm 2.15 cm/m   RA Area:     12.00 cm LA Vol (A2C):   36.4 ml 22.35 ml/m  RA Volume:   25.20 ml  15.47 ml/m LA Vol (A4C):   42.2 ml 25.91 ml/m LA Biplane Vol: 42.3 ml 25.98 ml/m  AORTIC VALVE LVOT Vmax:   95.40 cm/s LVOT Vmean:  63.400 cm/s LVOT VTI:    0.214 m  AORTA Ao Root diam: 2.90 cm Ao Asc diam:  3.30 cm MITRAL VALVE                TRICUSPID VALVE MV Area (PHT): 3.65 cm     TR Peak grad:   29.4 mmHg MV Decel Time: 208 msec     TR Vmax:        271.00 cm/s MV E velocity: 90.10 cm/s MV A velocity: 119.00  cm/s  SHUNTS MV E/A ratio:  0.76         Systemic VTI:  0.21 m                             Systemic Diam: 1.80 cm Olga Millers MD Electronically signed by Olga Millers MD Signature Date/Time: 11/15/2022/12:09:29 PM    Final    CT Angio Chest PE W and/or Wo Contrast Result Date: 11/14/2022 CLINICAL DATA:  Weakness EXAM: CT ANGIOGRAPHY CHEST WITH CONTRAST TECHNIQUE: Multidetector CT imaging of the chest was performed using the standard protocol during bolus administration of intravenous contrast. Multiplanar CT image reconstructions and MIPs were obtained to evaluate the vascular anatomy. RADIATION DOSE REDUCTION: This exam was performed according to the departmental dose-optimization program which includes automated exposure control, adjustment of the mA and/or kV according to patient size and/or use of iterative reconstruction technique. CONTRAST:  75mL OMNIPAQUE IOHEXOL 350 MG/ML SOLN COMPARISON:  CT 07/06/2018, 03/28/2019 FINDINGS: Cardiovascular: Satisfactory opacification of the pulmonary arteries to the segmental level. No evidence of pulmonary embolism. Mild aortic atherosclerosis. No aneurysm. Mild cardiomegaly. No pericardial effusion. Mediastinum/Nodes: Midline trachea. No thyroid mass. No suspicious lymph nodes. Moderate hiatal hernia. Lungs/Pleura: No consolidation or effusion. Diffuse bilateral mosaic attenuation. Upper Abdomen: No acute finding. Increased size of a cystic lesion in the upper pole of the right kidney, now measuring 6 cm with thin linear areas of enhancement, possibly enhancing septa. Musculoskeletal: No acute or suspicious osseous abnormality. Review of the MIP images confirms the above findings. IMPRESSION: 1. Negative for acute pulmonary embolus. 2. Diffuse bilateral mosaic attenuation of the lungs, could be secondary to small vessel disease or small airways disease. 3. Increased size of a cystic lesion in the upper pole of the right kidney, now measuring 6 cm with thin linear  areas of enhancement, possibly enhancing septa. When the patient is clinically stable and able to follow directions and hold their breath (preferably as an outpatient) further evaluation with dedicated abdominal MRI should be considered. 4. Aortic atherosclerosis.  Aortic Atherosclerosis (ICD10-I70.0). Electronically Signed   By: Jasmine Pang M.D.   On: 11/14/2022 19:40   DG Chest Port 1 View Result Date: 11/14/2022 CLINICAL DATA:  Weakness. EXAM: PORTABLE CHEST 1 VIEW COMPARISON:  None Available. FINDINGS: The heart size and mediastinal contours are within normal limits. Both lungs are clear. The visualized skeletal structures are unremarkable. IMPRESSION: No active disease. Electronically Signed   By: Gerome Sam III M.D.   On: 11/14/2022 17:02    Assessment & Plan:   Problem List Items Addressed This Visit     Low back pain   Chronic due to OA ROM exercises      Hypertension   Chronic On Coreg, Diovan      CRF (chronic renal failure), stage 3 (moderate) (HCC)   Monitor GFR. Hydrate well      Shoulder pain, left - Primary   Worsening stiffness in the L shoulder, L handed Ortho ref, steroid inj was offered - pt declined ROM exercises Blue-Emu cream was recommended to use 2-3 times a day         No orders of the defined types were placed in this encounter.     Follow-up: No follow-ups on file.  [name], MD

## 2023-08-04 NOTE — Assessment & Plan Note (Addendum)
Worsening stiffness in the L shoulder, L handed Ortho ref, steroid inj was offered - pt declined ROM exercises Blue-Emu cream was recommended to use 2-3 times a day

## 2023-08-29 ENCOUNTER — Ambulatory Visit (INDEPENDENT_AMBULATORY_CARE_PROVIDER_SITE_OTHER): Payer: Medicare Other

## 2023-08-29 VITALS — Ht 61.0 in | Wt 142.0 lb

## 2023-08-29 DIAGNOSIS — Z780 Asymptomatic menopausal state: Secondary | ICD-10-CM | POA: Diagnosis not present

## 2023-08-29 DIAGNOSIS — Z0000 Encounter for general adult medical examination without abnormal findings: Secondary | ICD-10-CM | POA: Diagnosis not present

## 2023-08-29 NOTE — Patient Instructions (Signed)
 Cindy Simpson , Thank you for taking time to come for your Medicare Wellness Visit. I appreciate your ongoing commitment to your health goals. Please review the following plan we discussed and let me know if I can assist you in the future.   Referrals/Orders/Follow-Ups/Clinician Recommendations: It was nice talking to you today.  YEach day, aim for 6 glasses of water, plenty of protein in your diet and try to get up and walk/ stretch every hour for 5-10 minutes at a time.  ou have an order for:   [x]   Bone Density     Please call for appointment:  [facility] [address] [place], [place] [address] [phone]    Make sure to wear two-piece clothing.  No lotions, powders, or deodorants the day of the appointment. Make sure to bring picture ID and insurance card.  Bring list of medications you are currently taking including any supplements.   Schedule your [place] screening mammogram through MyChart!   Log into your MyChart account.  Go to 'Visit' (or 'Appointments' if on mobile App) --> Schedule an Appointment  Under 'Select a Reason for Visit' choose the Mammogram Screening option.  Complete the pre-visit questions and select the time and place that best fits your schedule.    This is a list of the screening recommended for you and due dates:  Health Maintenance  Topic Date Due   Yearly kidney health urinalysis for diabetes  Never done   Hepatitis C Screening  Never done   COVID-19 Vaccine (6 - 2024-25 season) 03/20/2023   Medicare Annual Wellness Visit  08/27/2023   Yearly kidney function blood test for diabetes  05/03/2024   DTaP/Tdap/Td vaccine (3 - Td or Tdap) 01/27/2030   Pneumonia Vaccine  Completed   Flu Shot  Completed   DEXA scan (bone density measurement)  Completed   Zoster (Shingles) Vaccine  Completed   HPV Vaccine  Aged Out   Cologuard (Stool DNA test)  Discontinued    Advanced directives: (Copy Requested) Please bring a copy  of your health care power of attorney and living will to the office to be added to your chart at your convenience.  Next Medicare Annual Wellness Visit scheduled for next year: Yes

## 2023-08-29 NOTE — Progress Notes (Cosign Needed Addendum)
Subjective:   Cindy Simpson is a 78 y.o. female who presents for Medicare Annual (Subsequent) preventive examination.  Visit Complete: Virtual I connected with  [name] on 08/29/23 by a audio enabled telemedicine application and verified that I am speaking with the correct person using two identifiers.  Patient Location: Home  Provider Location: Home Office  I discussed the limitations of evaluation and management by telemedicine. The patient expressed understanding and agreed to proceed.  Vital Signs: Because this visit was a virtual/telehealth visit, some criteria may be missing or patient reported. Any vitals not documented were not able to be obtained and vitals that have been documented are patient reported.    Cardiac Risk Factors include: advanced age (>67men, >86 women);hypertension;Other (see comment), Risk factor comments: CAD, Asthma, AKI, CRF     Objective:    Today's Vitals   08/29/23 1339  Weight: 142 lb (64.4 kg)  Height: 5\' 1"  (1.549 m)   Body mass index is 26.83 kg/m.     08/29/2023    1:49 PM 08/26/2022    1:05 PM 08/25/2021    1:45 PM 08/21/2020    1:10 PM 03/28/2019   12:23 PM 07/05/2018    9:57 PM 04/05/2017    3:57 PM  Advanced Directives  Does Patient Have a Medical Advance Directive? No No No No No No No  Would patient like information on creating a medical advance directive? No - Patient declined No - Patient declined No - Patient declined No - Patient declined       Current Medications (verified) Outpatient Encounter Medications as of 08/29/2023  Medication Sig   Aspirin 81 MG CAPS Take 1 tablet by mouth daily.   atorvastatin (LIPITOR) 20 MG tablet Take 1 tablet (20 mg total) by mouth daily.   bismuth subsalicylate (PEPTO BISMOL) 262 MG/15ML suspension Take 30 mLs by mouth every 6 (six) hours as needed for indigestion.   carvedilol (COREG) 6.25 MG tablet TAKE 1 TABLET BY MOUTH TWICE  DAILY WITH A MEAL. ANNUAL  APPOINTMENT DUE IN JULY MUST  SEE PROVIDER FOR FUTURE REFILLS   cetirizine (ZYRTEC) 10 MG tablet Take 10 mg by mouth at bedtime.   Cholecalciferol 1000 UNITS tablet Take 1,000 Units by mouth daily after supper.    Continuous Blood Gluc Receiver (FREESTYLE LIBRE 2 READER) DEVI 1 Units by Does not apply route every 14 (fourteen) days.   Continuous Blood Gluc Sensor (FREESTYLE LIBRE 2 SENSOR) MISC 1 Units by Does not apply route every 14 (fourteen) days.   diclofenac sodium (VOLTAREN) 1 % GEL Apply 2 g topically 4 (four) times daily. (Patient taking differently: Apply 2 g topically daily as needed (for pain).)   FLUZONE HIGH-DOSE QUADRIVALENT 0.7 ML SUSY    glucose blood (ACCU-CHEK AVIVA) test strip Use to check blood sugars twice a day   Lancets (ACCU-CHEK SOFT TOUCH) lancets Use to check blood sugars twice a day   levothyroxine (SYNTHROID) 100 MCG tablet Take 100 mcg by mouth at bedtime.   LORazepam (ATIVAN) 0.5 MG tablet Take 1 tablet (0.5 mg total) by mouth every 6 (six) hours as needed for anxiety (anxiety or panic feeling).   pantoprazole (PROTONIX) 40 MG tablet TAKE 1 TABLET BY MOUTH TWICE  DAILY   valsartan (DIOVAN) 80 MG tablet TAKE 1 TABLET BY MOUTH DAILY   carvedilol (COREG) 3.125 MG tablet Take 1 tablet (3.125 mg total) by mouth 2 (two) times daily.   No facility-administered encounter medications on file as of  08/29/2023.    Allergies (verified) Lisinopril, Vinegar [acetic acid], and Metoclopramide hcl   History: Past Medical History:  Diagnosis Date   Allergic rhinitis    Anemia    Asthmatic bronchitis 2009   allergies; post nasal drip but not allergies   GERD (gastroesophageal reflux disease)    History of hiatal hernia    Hyperlipidemia    Hypertension    Hypothyroidism    IBS (irritable bowel syndrome)    Insomnia    Irregular heart beat Not sure; long time ago   Periodic irregular heart rate; take verapamil.   Status post dilation of esophageal narrowing    Torticollis 2006   Estimate date;  rec'd botox and resolved   Past Surgical History:  Procedure Laterality Date   ABDOMINAL HYSTERECTOMY     with ovaries removed also.   CARDIAC CATHETERIZATION N/A 09/13/2015   Procedure: Left Heart Cath and Coronary Angiography;  Surgeon: Rinaldo Cloud, MD;  Location: [facility] INVASIVE CV LAB;  Service: Cardiovascular;  Laterality: N/A;   CATARACT EXTRACTION Bilateral 2014   approx 2 years ago   CATARACT EXTRACTION     CLEFT PALATE REPAIR     child   COLONOSCOPY     ESOPHAGOGASTRODUODENOSCOPY (EGD) WITH PROPOFOL N/A 12/30/2015   Procedure: ESOPHAGOGASTRODUODENOSCOPY (EGD) WITH PROPOFOL;  Surgeon: Sherrilyn Rist, MD;  Location: [facility] ENDOSCOPY;  Service: Gastroenterology;  Laterality: N/A;   HIATAL HERNIA REPAIR N/A 03/09/2016   Procedure: LAPAROSCOPIC REPAIR OF HIATAL HERNIA  NISSEN FUNDOPLICATION UPPER ENDOSCOPY;  Surgeon: Avel Peace, MD;  Location: [facility] ORS;  Service: General;  Laterality: N/A;   NISSEN FUNDOPLICATION N/A 03/09/2016   Procedure: NISSEN FUNDOPLICATION;  Surgeon: Avel Peace, MD;  Location: [facility] ORS;  Service: General;  Laterality: N/A;   TUBAL LIGATION Bilateral 1985   UPPER GI ENDOSCOPY  03/09/2016   Procedure: UPPER GI ENDOSCOPY;  Surgeon: Avel Peace, MD;  Location: [facility] ORS;  Service: General;;   Family History  Problem Relation Age of Onset   Hypertension Mother    Heart disease Father    Stroke Father    Stroke Brother        brain aneurism   Colon polyps Sister    Other Sister        c diff-deceased   Stomach cancer Neg Hx    Rectal cancer Neg Hx    Esophageal cancer Neg Hx    Colon cancer Neg Hx    Social History   Socioeconomic History   Marital status: Married    Spouse name: Alex   Number of children: 0   Years of education: Not on file   Highest education level: Not on file  Occupational History   Occupation: Retired  Tobacco Use   Smoking status: Never   Smokeless tobacco: Never  Vaping Use   Vaping status: Never Used  Substance and  Sexual Activity   Alcohol use: Yes    Alcohol/week: 1.0 - 2.0 standard drink of alcohol    Types: 1 - 2 Shots of liquor per week    Comment: Perhaps every couple of weeks; not very frequently   Drug use: No   Sexual activity: Never  Other Topics Concern   Not on file  Social History Narrative   Lives with husband   Social Drivers of Health   Financial Resource Strain: Low Risk  (08/29/2023)   Overall Financial Resource Strain (CARDIA)    Difficulty of Paying Living Expenses: Not very hard  Food Insecurity: No Food  Insecurity (08/29/2023)   Hunger Vital Sign    Worried About Running Out of Food in the Last Year: Never true    Ran Out of Food in the Last Year: Never true  Transportation Needs: No Transportation Needs (08/29/2023)   PRAPARE - Administrator, Civil Service (Medical): No    Lack of Transportation (Non-Medical): No  Physical Activity: Insufficiently Active (08/29/2023)   Exercise Vital Sign    Days of Exercise per Week: 7 days    Minutes of Exercise per Session: 10 min  Stress: No Stress Concern Present (08/29/2023)   [employer] of Occupational Health - Occupational Stress Questionnaire    Feeling of Stress : Not at all  Social Connections: Socially Isolated (08/29/2023)   Social Connection and Isolation Panel [NHANES]    Frequency of Communication with Friends and Family: Once a week    Frequency of Social Gatherings with Friends and Family: Once a week    Attends Religious Services: Never    Database administrator or Organizations: No    Attends Engineer, structural: Never    Marital Status: Married    Tobacco Counseling Counseling given: Not Answered   Clinical Intake:  Pre-visit preparation completed: Yes  Pain : No/denies pain     BMI - recorded: 26.83 Nutritional Status: BMI of 19-24  Normal Nutritional Risks: Nausea/ vomitting/ diarrhea Diabetes: No  How often do you need to have someone help you when you read  instructions, pamphlets, or other written materials from your doctor or pharmacy?: 1 - Never  Interpreter Needed?: No  Information entered by :: Elim Economou, RMA   Activities of Daily Living    08/29/2023    1:42 PM  In your present state of health, do you have any difficulty performing the following activities:  Hearing? 0  Vision? 0  Difficulty concentrating or making decisions? 0  Walking or climbing stairs? 0  Dressing or bathing? 0  Doing errands, shopping? 0  Preparing Food and eating ? N  Using the Toilet? N  In the past six months, have you accidently leaked urine? Y  Do you have problems with loss of bowel control? N  Managing your Medications? N  Managing your Finances? N  Housekeeping or managing your Housekeeping? N    Patient Care Team: [name], [name], MD as PCP - General Danis, Andreas Blower, MD as Consulting Physician (Gastroenterology) Rinaldo Cloud, MD as Consulting Physician (Cardiology) Shon Millet, MD as Consulting Physician (Ophthalmology)  Indicate any recent Medical Services you may have received from other than [facility] providers in the past year (date may be approximate).     Assessment:   This is a routine wellness examination for Evvie.  Hearing/Vision screen Hearing Screening - Comments:: Denies hearing difficulties   Vision Screening - Comments:: Denies vision issues.    Goals Addressed             This Visit's Progress    My healthcare goal for 2024 is to maintain my current health status by continuing to eat healthy, independent, stay physically active and socially active.   On track     Depression Screen    08/29/2023    1:52 PM 08/04/2023   10:04 AM 11/24/2022    3:14 PM 09/29/2022    9:21 AM 08/26/2022    1:11 PM 06/21/2022    9:58 AM 01/12/2022    9:06 AM  PHQ 2/9 Scores  PHQ - 2 Score 0 0  0 0 0 0 0  PHQ- 9 Score 1  2 2  3      Fall Risk    08/29/2023    1:49 PM 08/04/2023   10:04 AM 11/24/2022    3:13 PM 09/29/2022     9:21 AM 08/26/2022    1:06 PM  Fall Risk   Falls in the past year? 0 0 0 1 0  Number falls in past yr: 0 0 0 1 0  Injury with Fall? 0 0 0 0 0  Risk for fall due to : No Fall Risks No Fall Risks No Fall Risks History of fall(s) No Fall Risks  Follow up Falls prevention discussed;Falls evaluation completed Falls evaluation completed Falls evaluation completed Falls evaluation completed Falls prevention discussed    MEDICARE RISK AT HOME: Medicare Risk at Home Any stairs in or around the home?: No Home free of loose throw rugs in walkways, pet beds, electrical cords, etc?: Yes Adequate lighting in your home to reduce risk of falls?: Yes Life alert?: No Use of a cane, walker or w/c?: No Grab bars in the bathroom?: Yes Shower chair or bench in shower?: Yes Elevated toilet seat or a handicapped toilet?: No  TIMED UP AND GO:  Was the test performed?  No    Cognitive Function:        08/29/2023    1:43 PM 08/26/2022    1:10 PM  6CIT Screen  What Year? 0 points 0 points  What month? 0 points 0 points  What time? 0 points 0 points  Count back from 20 0 points 0 points  Months in reverse 0 points 0 points  Repeat phrase 0 points 0 points  Total Score 0 points 0 points    Immunizations Immunization History  Administered Date(s) Administered   Fluad Quad(high Dose 65+) 04/25/2021, 03/25/2022   Fluad Trivalent(High Dose 65+) 05/04/2023   Influenza Split 04/03/2012   Influenza Whole 03/24/2010, 04/19/2011   Influenza, High Dose Seasonal PF 03/30/2016, 03/25/2017, 04/10/2018, 03/15/2019   Influenza-Unspecified 03/19/2013, 04/25/2020   Moderna Covid-19 Vaccine Bivalent Booster 61yrs & up 05/06/2022   PFIZER(Purple Top)SARS-COV-2 Vaccination 07/28/2019, 08/18/2019, 04/01/2020   Pfizer Covid-19 Vaccine Bivalent Booster 32yrs & up 04/11/2021   Pneumococcal Conjugate-13 08/14/2013   Pneumococcal Polysaccharide-23 04/16/2011   Td 09/15/2009   Tdap 01/28/2020   Zoster  Recombinant(Shingrix) 05/09/2018, 07/07/2018    TDAP status: Up to date  Flu Vaccine status: Up to date  Pneumococcal vaccine status: Up to date  Covid-19 vaccine status: Completed vaccines  Qualifies for Shingles Vaccine? Yes   Zostavax completed Yes   Shingrix Completed?: Yes  Screening Tests Health Maintenance  Topic Date Due   Diabetic kidney evaluation - Urine ACR  Never done   Hepatitis C Screening  Never done   COVID-19 Vaccine (6 - 2024-25 season) 03/20/2023   Diabetic kidney evaluation - eGFR measurement  05/03/2024   Medicare Annual Wellness (AWV)  08/28/2024   DTaP/Tdap/Td (3 - Td or Tdap) 01/27/2030   Pneumonia Vaccine 79+ Years old  Completed   INFLUENZA VACCINE  Completed   DEXA SCAN  Completed   Zoster Vaccines- Shingrix  Completed   HPV VACCINES  Aged Out   Fecal DNA (Cologuard)  Discontinued    Health Maintenance  Health Maintenance Due  Topic Date Due   Diabetic kidney evaluation - Urine ACR  Never done   Hepatitis C Screening  Never done   COVID-19 Vaccine (6 - 2024-25 season) 03/20/2023    Colorectal  cancer screening: No longer required.   Mammogram status: Completed 05/05/2023. Repeat every year  Bone Density status: Ordered 08/29/23. Pt provided with contact info and advised to call to schedule appt.  Lung Cancer Screening: (Low Dose CT Chest recommended if Age 79-80 years, 20 pack-year currently smoking OR have quit w/in 15years.) does not qualify.   Lung Cancer Screening Referral: n/a  Additional Screening:  Hepatitis C Screening: does qualify;   Vision Screening: Recommended annual ophthalmology exams for early detection of glaucoma and other disorders of the eye. Is the patient up to date with their annual eye exam?  Yes  Who is the provider or what is the name of the office in which the patient attends annual eye exams? [facility] If pt is not established with a provider, would they like to be referred to a provider to establish care? No  .   Dental Screening: Recommended annual dental exams for proper oral hygiene   Community Resource Referral / Chronic Care Management: CRR required this visit?  No   CCM required this visit?  No     Plan:     I have personally reviewed and noted the following in the patient's chart:   Medical and social history Use of alcohol, tobacco or illicit drugs  Current medications and supplements including opioid prescriptions. Patient is not currently taking opioid prescriptions. Functional ability and status Nutritional status Physical activity Advanced directives List of other physicians Hospitalizations, surgeries, and ER visits in previous 12 months Vitals Screenings to include cognitive, depression, and falls Referrals and appointments  In addition, I have reviewed and discussed with patient certain preventive protocols, quality metrics, and best practice recommendations. A written personalized care plan for preventive services as well as general preventive health recommendations were provided to patient.     Jawad Wiacek L Sitlali Koerner, CMA   08/29/2023   After Visit Summary: (MyChart) Due to this being a telephonic visit, the after visit summary with patients personalized plan was offered to patient via MyChart   Nurse Notes: Patient is due for a diabetic kidney evaluation.  She has never had a Hep C screening.  Patient is also due for a DEXA.  She had no other concerns to address today.   Medical screening examination/treatment/procedure(s) were performed by non-physician practitioner and as supervising physician I was immediately available for consultation/collaboration.  I agree with above. Jacinta Shoe, MD

## 2023-09-06 ENCOUNTER — Encounter: Payer: Self-pay | Admitting: Internal Medicine

## 2023-09-10 ENCOUNTER — Ambulatory Visit
Admission: RE | Admit: 2023-09-10 | Discharge: 2023-09-10 | Disposition: A | Discharge status: Home / Self Care | Payer: Medicare Other | Source: Ambulatory Visit | Attending: Internal Medicine | Admitting: Internal Medicine

## 2023-09-10 DIAGNOSIS — M8588 Other specified disorders of bone density and structure, other site: Secondary | ICD-10-CM | POA: Diagnosis not present

## 2023-09-10 DIAGNOSIS — Z780 Asymptomatic menopausal state: Secondary | ICD-10-CM

## 2023-09-12 ENCOUNTER — Encounter: Payer: Self-pay | Admitting: Internal Medicine

## 2023-09-26 ENCOUNTER — Other Ambulatory Visit: Payer: Self-pay | Admitting: Internal Medicine

## 2023-10-03 ENCOUNTER — Encounter: Payer: Self-pay | Admitting: Internal Medicine

## 2023-10-06 ENCOUNTER — Encounter: Payer: Self-pay | Admitting: Podiatry

## 2023-10-06 ENCOUNTER — Ambulatory Visit: Payer: Medicare Other | Admitting: Podiatry

## 2023-10-06 DIAGNOSIS — B351 Tinea unguium: Secondary | ICD-10-CM

## 2023-10-06 DIAGNOSIS — M79674 Pain in right toe(s): Secondary | ICD-10-CM | POA: Diagnosis not present

## 2023-10-06 DIAGNOSIS — M79675 Pain in left toe(s): Secondary | ICD-10-CM | POA: Diagnosis not present

## 2023-10-06 DIAGNOSIS — E1149 Type 2 diabetes mellitus with other diabetic neurological complication: Secondary | ICD-10-CM | POA: Diagnosis not present

## 2023-10-06 DIAGNOSIS — Q828 Other specified congenital malformations of skin: Secondary | ICD-10-CM | POA: Diagnosis not present

## 2023-10-06 NOTE — Progress Notes (Signed)
 Subjective: Chief Complaint  Patient presents with   RFC    RM#12 RFC patient states has no concern just nail trim.    78 year old female presents the office today for evaluation of painful calluses as well as toenails.  No recent injuries.  No radiating pain.  Denies any open lesions.  [name], [name], MD Last seen 08/04/2023   Objective: AAO x3, NAD DP/PT pulses palpable bilaterally, CRT less than 3 seconds  Hyperkeratotic lesion submetatarsal 1, 3, 5 bilaterally.  No ongoing ulceration drainage or signs of infection.  There is no underlying ulceration drainage or any signs of infection. The nails are hypertrophic, dystrophic, discolored x 10 and are causing irritation/pain. No redness, swelling, drainage.  No open lesions or pre-ulcerative lesions.  Bunions present. Prominent metatarsal heads with atrophy of the fat pad.  No pain with calf compression, swelling, warmth, erythema  Assessment: Hyperkeratotic lesions/preulcerative calluses, symptomatic onychomycosis  Plan: -All treatment options discussed with the patient including all alternatives, risks, complications.  -Debrided the calluses x 6 without complications or bleeding.  Continue moisturizer, offloading. -Nails debrided x10 without complications or bleeding. -Given the prominence of metatarsal heads continue with metatarsal support, offloading. -Discussed daily foot inspection  Return in about 3 months (around 01/06/2024).  Vivi Barrack DPM

## 2023-11-02 ENCOUNTER — Ambulatory Visit: Payer: Medicare Other | Admitting: Internal Medicine

## 2023-11-14 ENCOUNTER — Encounter: Payer: Self-pay | Admitting: Internal Medicine

## 2023-11-14 ENCOUNTER — Ambulatory Visit (INDEPENDENT_AMBULATORY_CARE_PROVIDER_SITE_OTHER): Admitting: Internal Medicine

## 2023-11-14 VITALS — BP 118/82 | HR 59 | Temp 97.9°F | Ht 61.0 in | Wt 139.4 lb

## 2023-11-14 DIAGNOSIS — M25512 Pain in left shoulder: Secondary | ICD-10-CM

## 2023-11-14 DIAGNOSIS — I2510 Atherosclerotic heart disease of native coronary artery without angina pectoris: Secondary | ICD-10-CM | POA: Diagnosis not present

## 2023-11-14 DIAGNOSIS — G8929 Other chronic pain: Secondary | ICD-10-CM | POA: Diagnosis not present

## 2023-11-14 DIAGNOSIS — N183 Chronic kidney disease, stage 3 unspecified: Secondary | ICD-10-CM | POA: Diagnosis not present

## 2023-11-14 DIAGNOSIS — E034 Atrophy of thyroid (acquired): Secondary | ICD-10-CM | POA: Diagnosis not present

## 2023-11-14 DIAGNOSIS — E785 Hyperlipidemia, unspecified: Secondary | ICD-10-CM

## 2023-11-14 NOTE — Assessment & Plan Note (Signed)
 Chronic On Levothroid -

## 2023-11-14 NOTE — Assessment & Plan Note (Signed)
 L shoulder pain - better (L handed)

## 2023-11-14 NOTE — Assessment & Plan Note (Signed)
 Mild Lovastatin  d/c, ASA Chronic Pt is seeing her heart doctor - Dr Glena Landau Cont on Atorvastatin 

## 2023-11-14 NOTE — Assessment & Plan Note (Signed)
 Cont on Lovastatin

## 2023-11-14 NOTE — Progress Notes (Signed)
 Subjective:  Patient ID: Cindy Simpson, female    DOB: [birth year]  Age: 78 y.o. MRN: [id number]  CC: Medical Management of Chronic Issues (3 Month Follow Up. Notes left shoulder pain and stiffness has gotten better)   HPI Pattricia L Elbaum presents for L shoulder pain - better (L handed). Pt cuts her grass F/u CRF, HTN, dyslipidemia  Outpatient Medications Prior to Visit  Medication Sig Dispense Refill   Aspirin  81 MG CAPS Take 1 tablet by mouth daily.     atorvastatin  (LIPITOR) 20 MG tablet Take 1 tablet (20 mg total) by mouth daily. 90 tablet 3   bismuth subsalicylate (PEPTO BISMOL) 262 MG/15ML suspension Take 30 mLs by mouth every 6 (six) hours as needed for indigestion.     carvedilol  (COREG ) 6.25 MG tablet TAKE 1 TABLET BY MOUTH TWICE  DAILY WITH A MEAL 200 tablet 2   cetirizine (ZYRTEC) 10 MG tablet Take 10 mg by mouth at bedtime.     Cholecalciferol  1000 UNITS tablet Take 2,000 Units by mouth daily after supper. Informed to start 2000 units daily     Continuous Blood Gluc Receiver (FREESTYLE LIBRE 2 READER) DEVI 1 Units by Does not apply route every 14 (fourteen) days. 1 each 0   Continuous Blood Gluc Sensor (FREESTYLE LIBRE 2 SENSOR) MISC 1 Units by Does not apply route every 14 (fourteen) days. 6 each 3   diclofenac  sodium (VOLTAREN ) 1 % GEL Apply 2 g topically 4 (four) times daily. (Patient taking differently: Apply 2 g topically daily as needed (for pain).) 100 g 3   FLUZONE HIGH-DOSE QUADRIVALENT 0.7 ML SUSY      glucose blood (ACCU-CHEK AVIVA) test strip Use to check blood sugars twice a day 100 each 5   Lancets (ACCU-CHEK SOFT TOUCH) lancets Use to check blood sugars twice a day 100 each 5   levothyroxine  (SYNTHROID ) 100 MCG tablet Take 100 mcg by mouth at bedtime.     LORazepam  (ATIVAN ) 0.5 MG tablet Take 1 tablet (0.5 mg total) by mouth every 6 (six) hours as needed for anxiety (anxiety or panic feeling). 30 tablet 0   pantoprazole  (PROTONIX ) 40 MG tablet TAKE 1 TABLET BY  MOUTH TWICE  DAILY 200 tablet 2   valsartan  (DIOVAN ) 80 MG tablet TAKE 1 TABLET BY MOUTH DAILY 100 tablet 2   carvedilol  (COREG ) 3.125 MG tablet Take 1 tablet (3.125 mg total) by mouth 2 (two) times daily. 30 tablet 1   No facility-administered medications prior to visit.    ROS: Review of Systems  Constitutional:  Negative for activity change, appetite change, chills, fatigue and unexpected weight change.  HENT:  Negative for congestion, mouth sores and sinus pressure.   Eyes:  Negative for visual disturbance.  Respiratory:  Negative for cough and chest tightness.   Gastrointestinal:  Negative for abdominal pain and nausea.  Genitourinary:  Negative for difficulty urinating, frequency and vaginal pain.  Musculoskeletal:  Positive for arthralgias. Negative for back pain and gait problem.  Skin:  Negative for pallor and rash.  Neurological:  Negative for dizziness, tremors, weakness, numbness and headaches.  Psychiatric/Behavioral:  Negative for confusion, sleep disturbance and suicidal ideas.     Objective:  BP 118/82   Pulse (!) 59   Temp 97.9 F (36.6 C)   Ht 5\' 1"  (1.549 m)   Wt 139 lb 6.4 oz (63.2 kg)   SpO2 97%   BMI 26.34 kg/m   BP Readings from Last 3 Encounters:  11/14/23 118/82  08/04/23 122/76  05/04/23 118/80    Wt Readings from Last 3 Encounters:  11/14/23 139 lb 6.4 oz (63.2 kg)  08/29/23 142 lb (64.4 kg)  08/04/23 142 lb (64.4 kg)    Physical Exam Constitutional:      General: She is not in acute distress.    Appearance: She is well-developed. She is obese.  HENT:     Head: Normocephalic.     Right Ear: External ear normal.     Left Ear: External ear normal.     Nose: Nose normal.  Eyes:     General:        Right eye: No discharge.        Left eye: No discharge.     Conjunctiva/sclera: Conjunctivae normal.     Pupils: Pupils are equal, round, and reactive to light.  Neck:     Thyroid : No thyromegaly.     Vascular: No JVD.     Trachea: No  tracheal deviation.  Cardiovascular:     Rate and Rhythm: Normal rate and regular rhythm.     Heart sounds: Normal heart sounds.  Pulmonary:     Effort: No respiratory distress.     Breath sounds: No stridor. No wheezing.  Abdominal:     General: Bowel sounds are normal. There is no distension.     Palpations: Abdomen is soft. There is no mass.     Tenderness: There is no abdominal tenderness. There is no guarding or rebound.  Musculoskeletal:        General: No tenderness.     Cervical back: Normal range of motion and neck supple. No rigidity.  Lymphadenopathy:     Cervical: No cervical adenopathy.  Skin:    Findings: No erythema or rash.  Neurological:     Cranial Nerves: No cranial nerve deficit.     Motor: No abnormal muscle tone.     Coordination: Coordination normal.     Deep Tendon Reflexes: Reflexes normal.  Psychiatric:        Behavior: Behavior normal.        Thought Content: Thought content normal.        Judgment: Judgment normal.   L shoulder w/less pain  Lab Results  Component Value Date   WBC 7.4 05/04/2023   HGB 12.1 05/04/2023   HCT 37.3 05/04/2023   PLT 348.0 05/04/2023   GLUCOSE 95 05/04/2023   CHOL 160 10/12/2021   TRIG 93 10/12/2021   HDL 59 10/12/2021   LDLDIRECT 137.5 03/17/2009   LDLCALC 84 10/12/2021   ALT 18 05/04/2023   AST 22 05/04/2023   NA 133 (L) 05/04/2023   K 5.0 05/04/2023   CL 98 05/04/2023   CREATININE 1.06 05/04/2023   BUN 21 05/04/2023   CO2 29 05/04/2023   TSH 1.831 11/14/2022   INR 1.2 11/14/2022   HGBA1C 6.1 01/12/2022    DG Bone Density Result Date: 09/12/2023 EXAM: DUAL X-RAY ABSORPTIOMETRY (DXA) FOR BONE MINERAL DENSITY IMPRESSION: Referring Physician:  Genia Kettering Your patient completed a bone mineral density test using GE Lunar iDXA system (analysis version: 16). Technologist: BEC PATIENT: Name: Breault, Latrese L Patient ID: [id number] Birth Date: 01-28-1946 Height: 59.5 in. Sex: Female Measured: 09/10/2023  Weight: 139.0 lbs. Indications: Advanced Age, Bilateral Oophorectomy (65.51), Diabetic non insulin, Estrogen Deficient, Height Loss (781.91), Hypothyroid, Hysterectomy, Levothyroxine , Pantoprazole , Postmenopausal Fractures: NONE Treatments: Vitamin D  (E933.5) ASSESSMENT: The BMD measured at Femur Neck Right is 0.747 g/cm2 with a T-score of -2.1. This patient  is considered osteopenic/low bone mass according to [facility]) criteria. L2 was excluded due to degenerative changes. The quality of the exam is good. Site Region Measured Date Measured Age YA BMD Significant CHANGE T-score DualFemur Neck Right 09/10/2023    78.1         -2.1    0.747 g/cm2 AP Spine  L1-L4 (L2) 09/10/2023    [age]         -1.2    1.022 g/cm2 DualFemur Total Mean 09/10/2023    78.1         -1.8    0.779 g/cm2 World Health Organization [facility]) criteria for post-menopausal, Caucasian Women: Normal       T-score at or above -1 SD Osteopenia   T-score between -1 and -2.5 SD Osteoporosis T-score at or below -2.5 SD RECOMMENDATION: 1. All patients should optimize calcium  and vitamin D  intake. 2. Consider FDA-approved medical therapies in postmenopausal women and men aged 36 years and older, based on the following: a. A hip or vertebral (clinical or morphometric) fracture. b. T-score = -2.5 at the femoral neck or spine after appropriate evaluation to exclude secondary causes. c. Low bone mass (T-score between -1.0 and -2.5 at the femoral neck or spine) and a 10-year probability of a hip fracture = 3% or a 10-year probability of a major osteoporosis-related fracture = 20% based on the [place] -adapted WHO algorithm. d. Clinician judgment and/or patient preferences may indicate treatment for people with 10-year fracture probabilities above or below these levels. FOLLOW-UP: Patients with diagnosis of osteoporosis or at high risk for fracture should have regular bone mineral density tests. Patients eligible for Medicare are allowed routine  testing every 2 years. The testing frequency can be increased to one year for patients who have rapidly progressing disease, are receiving or discontinuing medical therapy to restore bone mass, or have additional risk factors. I have reviewed this study and agree with the findings. [facility] FRAX* 10-year Probability of Fracture Based on femoral neck BMD: DualFemur (Right) Major Osteoporotic Fracture: 15.4% Hip Fracture:                4.5% Population:                  [place]  (Caucasian) Risk Factors:                None *FRAX is a Armed forces logistics/support/administrative officer of the [employer] for Metabolic Bone Disease, a World Science writer (WHO) [employer]. ASSESSMENT: The probability of a major osteoporotic fracture is 15.4% within the next ten years. The probability of a hip fracture is 4.5% within the next ten years. Electronically Signed   By: Alinda Apley M.D.   On: 09/12/2023 07:49    Assessment & Plan:   Problem List Items Addressed This Visit     Hypothyroidism - Primary   Chronic On Levothroid      Relevant Orders   CBC with Differential/Platelet   Comprehensive metabolic panel with GFR   TSH   Dyslipidemia   Cont on Lovastatin       Relevant Orders   Lipid panel   CAD (coronary artery disease)   Mild Lovastatin  d/c, ASA Chronic Pt is seeing her heart doctor - Dr Glena Landau Cont on Atorvastatin       Relevant Orders   CBC with Differential/Platelet   Comprehensive metabolic panel with GFR   TSH   CRF (chronic renal failure), stage 3 (moderate) (HCC)   Monitor  GFR. Hydrate well      Relevant Orders   CBC with Differential/Platelet   Comprehensive metabolic panel with GFR   TSH   Shoulder pain, left    L shoulder pain - better (L handed)         No orders of the defined types were placed in this encounter.     Follow-up: Return in about 3 months (around 02/13/2024) for a follow-up visit.  Anitra Barn, MD

## 2023-11-14 NOTE — Assessment & Plan Note (Signed)
Monitor GFR Hydrate well 

## 2023-11-15 LAB — COMPREHENSIVE METABOLIC PANEL WITH GFR
ALT: 15 U/L (ref 0–35)
AST: 20 U/L (ref 0–37)
Albumin: 4.1 g/dL (ref 3.5–5.2)
Alkaline Phosphatase: 93 U/L (ref 39–117)
BUN: 19 mg/dL (ref 6–23)
CO2: 30 meq/L (ref 19–32)
Calcium: 9.5 mg/dL (ref 8.4–10.5)
Chloride: 100 meq/L (ref 96–112)
Creatinine, Ser: 0.89 mg/dL (ref 0.40–1.20)
GFR: 62.13 mL/min (ref >60.00)
Glucose, Bld: 91 mg/dL (ref 70–99)
Potassium: 4.8 meq/L (ref 3.5–5.1)
Sodium: 134 meq/L — ABNORMAL LOW (ref 135–145)
Total Bilirubin: 0.6 mg/dL (ref 0.2–1.2)
Total Protein: 6.8 g/dL (ref 6.0–8.3)

## 2023-11-15 LAB — LIPID PANEL
Cholesterol: 152 mg/dL (ref 0–200)
HDL: 56.1 mg/dL (ref >39.00)
LDL Cholesterol: 78 mg/dL (ref 0–99)
NonHDL: 95.92
Total CHOL/HDL Ratio: 3
Triglycerides: 89 mg/dL (ref 0.0–149.0)
VLDL: 17.8 mg/dL (ref 0.0–40.0)

## 2023-11-15 LAB — CBC WITH DIFFERENTIAL/PLATELET
Basophils Absolute: 0.1 10*3/uL (ref 0.0–0.1)
Basophils Relative: 1 % (ref 0.0–3.0)
Eosinophils Absolute: 0.3 10*3/uL (ref 0.0–0.7)
Eosinophils Relative: 3.8 % (ref 0.0–5.0)
HCT: 34.3 % — ABNORMAL LOW (ref 36.0–46.0)
Hemoglobin: 11.8 g/dL — ABNORMAL LOW (ref 12.0–15.0)
Lymphocytes Relative: 27.4 % (ref 12.0–46.0)
Lymphs Abs: 2 10*3/uL (ref 0.7–4.0)
MCHC: 34.4 g/dL (ref 30.0–36.0)
MCV: 91.9 fl (ref 78.0–100.0)
Monocytes Absolute: 0.8 10*3/uL (ref 0.1–1.0)
Monocytes Relative: 11.3 % (ref 3.0–12.0)
Neutro Abs: 4.2 10*3/uL (ref 1.4–7.7)
Neutrophils Relative %: 56.5 % (ref 43.0–77.0)
Platelets: 335 10*3/uL (ref 150.0–400.0)
RBC: 3.73 Mil/uL — ABNORMAL LOW (ref 3.87–5.11)
RDW: 14.2 % (ref 11.5–15.5)
WBC: 7.4 10*3/uL (ref 4.0–10.5)

## 2023-11-15 LAB — TSH: TSH: 2.62 u[IU]/mL (ref 0.35–5.50)

## 2023-11-17 ENCOUNTER — Encounter: Payer: Self-pay | Admitting: Internal Medicine

## 2023-12-03 ENCOUNTER — Encounter: Payer: Self-pay | Admitting: Internal Medicine

## 2023-12-13 ENCOUNTER — Other Ambulatory Visit: Payer: Self-pay | Admitting: Internal Medicine

## 2024-01-06 ENCOUNTER — Ambulatory Visit: Admitting: Podiatry

## 2024-02-07 ENCOUNTER — Ambulatory Visit: Admitting: Podiatry

## 2024-02-07 ENCOUNTER — Encounter: Payer: Self-pay | Admitting: Podiatry

## 2024-02-07 DIAGNOSIS — M79674 Pain in right toe(s): Secondary | ICD-10-CM

## 2024-02-07 DIAGNOSIS — E1149 Type 2 diabetes mellitus with other diabetic neurological complication: Secondary | ICD-10-CM | POA: Diagnosis not present

## 2024-02-07 DIAGNOSIS — B351 Tinea unguium: Secondary | ICD-10-CM

## 2024-02-07 DIAGNOSIS — M79675 Pain in left toe(s): Secondary | ICD-10-CM

## 2024-02-07 DIAGNOSIS — Q828 Other specified congenital malformations of skin: Secondary | ICD-10-CM | POA: Diagnosis not present

## 2024-02-08 NOTE — Progress Notes (Signed)
 Subjective: Chief Complaint  Patient presents with   RFC    Rm11 Routine Foot Care/ Patient says that she is doing well    78 year old female presents the office today for evaluation of painful calluses as well as toenails.  No recent injuries.  No radiating pain.  Denies any open lesions.  [name], Karlynn GAILS, MD Last seen 11/14/2023   Objective: AAO x3, NAD DP/PT pulses palpable bilaterally, CRT less than 3 seconds  Hyperkeratotic lesion submetatarsal 1, 3, 5 bilaterally.  No ongoing ulceration drainage or signs of infection.  There is no underlying ulceration drainage or any signs of infection. The nails are hypertrophic, dystrophic, discolored x 10 and are causing irritation/pain. No redness, swelling, drainage.  No open lesions or pre-ulcerative lesions.  Bunions present. Prominent metatarsal heads with atrophy of the fat pad.  No pain with calf compression, swelling, warmth, erythema  Assessment: Hyperkeratotic lesions/preulcerative calluses, symptomatic onychomycosis  Plan: -All treatment options discussed with the patient including all alternatives, risks, complications.  -Debrided the calluses x 6 without complications or bleeding.  Continue moisturizer, offloading. -Nails debrided x10 without complications or bleeding. -Given the prominence of metatarsal heads continue with metatarsal support, offloading. -Discussed daily foot inspection  Return in about 2 months (around 04/08/2024).   Donnice JONELLE Fees DPM

## 2024-02-15 ENCOUNTER — Encounter: Payer: Self-pay | Admitting: Internal Medicine

## 2024-02-15 ENCOUNTER — Ambulatory Visit (INDEPENDENT_AMBULATORY_CARE_PROVIDER_SITE_OTHER): Admitting: Internal Medicine

## 2024-02-15 VITALS — BP 110/70 | HR 60 | Temp 98.5°F | Ht 61.0 in | Wt 139.2 lb

## 2024-02-15 DIAGNOSIS — I10 Essential (primary) hypertension: Secondary | ICD-10-CM

## 2024-02-15 DIAGNOSIS — G8929 Other chronic pain: Secondary | ICD-10-CM | POA: Diagnosis not present

## 2024-02-15 DIAGNOSIS — R739 Hyperglycemia, unspecified: Secondary | ICD-10-CM

## 2024-02-15 DIAGNOSIS — M545 Low back pain, unspecified: Secondary | ICD-10-CM

## 2024-02-15 DIAGNOSIS — E034 Atrophy of thyroid (acquired): Secondary | ICD-10-CM | POA: Diagnosis not present

## 2024-02-15 DIAGNOSIS — N183 Chronic kidney disease, stage 3 unspecified: Secondary | ICD-10-CM

## 2024-02-15 LAB — COMPREHENSIVE METABOLIC PANEL WITH GFR
ALT: 15 U/L (ref 0–35)
AST: 20 U/L (ref 0–37)
Albumin: 4.2 g/dL (ref 3.5–5.2)
Alkaline Phosphatase: 99 U/L (ref 39–117)
BUN: 18 mg/dL (ref 6–23)
CO2: 28 meq/L (ref 19–32)
Calcium: 9.3 mg/dL (ref 8.4–10.5)
Chloride: 95 meq/L — ABNORMAL LOW (ref 96–112)
Creatinine, Ser: 0.98 mg/dL (ref 0.40–1.20)
GFR: 55.25 mL/min — ABNORMAL LOW (ref >60.00)
Glucose, Bld: 91 mg/dL (ref 70–99)
Potassium: 4.6 meq/L (ref 3.5–5.1)
Sodium: 129 meq/L — ABNORMAL LOW (ref 135–145)
Total Bilirubin: 0.7 mg/dL (ref 0.2–1.2)
Total Protein: 6.8 g/dL (ref 6.0–8.3)

## 2024-02-15 LAB — MICROALBUMIN / CREATININE URINE RATIO
Creatinine,U: 39.4 mg/dL
Microalb Creat Ratio: UNDETERMINED mg/g (ref 0.0–30.0)
Microalb, Ur: <0.7 mg/dL

## 2024-02-15 NOTE — Assessment & Plan Note (Signed)
 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

## 2024-02-15 NOTE — Assessment & Plan Note (Signed)
Chronic On Coreg, Diovan 

## 2024-02-15 NOTE — Assessment & Plan Note (Signed)
Monitor GFR Hydrate well 

## 2024-02-15 NOTE — Progress Notes (Signed)
 Subjective:  Patient ID: Cindy Simpson, female    DOB: [birth year]  Age: 78 y.o. MRN: [id number]  CC: Medical Management of Chronic Issues (Pt here for 3 month f/u, Hypothyroidism, Hypertension, Chronic pain.)   HPI Dyneisha Simpson Gaumer presents for  3 month f/u, Hypothyroidism, Hypertension, Chronic pain.  Outpatient Medications Prior to Visit  Medication Sig Dispense Refill   Aspirin  81 MG CAPS Take 1 tablet by mouth daily.     atorvastatin  (LIPITOR) 20 MG tablet TAKE 1 TABLET BY MOUTH EVERY DAY 90 tablet 3   bismuth subsalicylate (PEPTO BISMOL) 262 MG/15ML suspension Take 30 mLs by mouth every 6 (six) hours as needed for indigestion.     carvedilol  (COREG ) 6.25 MG tablet TAKE 1 TABLET BY MOUTH TWICE  DAILY WITH A MEAL 200 tablet 2   cetirizine (ZYRTEC) 10 MG tablet Take 10 mg by mouth at bedtime.     Cholecalciferol  1000 UNITS tablet Take 2,000 Units by mouth daily after supper. Informed to start 2000 units daily     Continuous Blood Gluc Receiver (FREESTYLE LIBRE 2 READER) DEVI 1 Units by Does not apply route every 14 (fourteen) days. 1 each 0   Continuous Blood Gluc Sensor (FREESTYLE LIBRE 2 SENSOR) MISC 1 Units by Does not apply route every 14 (fourteen) days. 6 each 3   diclofenac  sodium (VOLTAREN ) 1 % GEL Apply 2 g topically 4 (four) times daily. (Patient taking differently: Apply 2 g topically daily as needed (for pain).) 100 g 3   FLUZONE HIGH-DOSE QUADRIVALENT 0.7 ML SUSY      glucose blood (ACCU-CHEK AVIVA) test strip Use to check blood sugars twice a day 100 each 5   ipratropium (ATROVENT) 0.03 % nasal spray Place 2 sprays into both nostrils 2 (two) times daily as needed.     Lancets (ACCU-CHEK SOFT TOUCH) lancets Use to check blood sugars twice a day 100 each 5   levothyroxine  (SYNTHROID ) 100 MCG tablet Take 100 mcg by mouth at bedtime.     LORazepam  (ATIVAN ) 0.5 MG tablet Take 1 tablet (0.5 mg total) by mouth every 6 (six) hours as needed for anxiety (anxiety or panic feeling).  30 tablet 0   pantoprazole  (PROTONIX ) 40 MG tablet TAKE 1 TABLET BY MOUTH TWICE  DAILY 200 tablet 2   valsartan  (DIOVAN ) 80 MG tablet TAKE 1 TABLET BY MOUTH DAILY 100 tablet 2   carvedilol  (COREG ) 3.125 MG tablet Take 1 tablet (3.125 mg total) by mouth 2 (two) times daily. 30 tablet 1   No facility-administered medications prior to visit.    ROS: Review of Systems  Constitutional:  Negative for activity change, appetite change, chills, fatigue and unexpected weight change.  HENT:  Negative for congestion, mouth sores and sinus pressure.   Eyes:  Negative for visual disturbance.  Respiratory:  Negative for cough and chest tightness.   Gastrointestinal:  Negative for abdominal pain and nausea.  Genitourinary:  Negative for difficulty urinating, frequency and vaginal pain.  Musculoskeletal:  Positive for arthralgias. Negative for back pain and gait problem.  Skin:  Negative for pallor and rash.  Neurological:  Negative for dizziness, tremors, weakness, numbness and headaches.  Psychiatric/Behavioral:  Negative for confusion and sleep disturbance.     Objective:  BP 110/70 (BP Location: Left Arm, Patient Position: Sitting, Cuff Size: Normal)   Pulse 60   Temp 98.5 F (36.9 C) (Oral)   Ht 5' 1 (1.549 m)   Wt 139 lb 4 oz (63.2 kg)  SpO2 97%   BMI 26.31 kg/m   BP Readings from Last 3 Encounters:  02/15/24 110/70  11/14/23 118/82  08/04/23 122/76    Wt Readings from Last 3 Encounters:  02/15/24 139 lb 4 oz (63.2 kg)  11/14/23 139 lb 6.4 oz (63.2 kg)  08/29/23 142 lb (64.4 kg)    Physical Exam Constitutional:      General: She is not in acute distress.    Appearance: She is well-developed. She is obese.  HENT:     Head: Normocephalic.     Right Ear: External ear normal.     Left Ear: External ear normal.     Nose: Nose normal.  Eyes:     General:        Right eye: No discharge.        Left eye: No discharge.     Conjunctiva/sclera: Conjunctivae normal.     Pupils:  Pupils are equal, round, and reactive to light.  Neck:     Thyroid : No thyromegaly.     Vascular: No JVD.     Trachea: No tracheal deviation.  Cardiovascular:     Rate and Rhythm: Normal rate and regular rhythm.     Heart sounds: Normal heart sounds.  Pulmonary:     Effort: No respiratory distress.     Breath sounds: No stridor. No wheezing.  Abdominal:     General: Bowel sounds are normal. There is no distension.     Palpations: Abdomen is soft. There is no mass.     Tenderness: There is no abdominal tenderness. There is no guarding or rebound.  Musculoskeletal:        General: Tenderness present.     Cervical back: Normal range of motion and neck supple. No rigidity.     Right lower leg: No edema.     Left lower leg: No edema.  Lymphadenopathy:     Cervical: No cervical adenopathy.  Skin:    Findings: No erythema or rash.  Neurological:     Cranial Nerves: No cranial nerve deficit.     Motor: No abnormal muscle tone.     Coordination: Coordination normal.     Deep Tendon Reflexes: Reflexes normal.  Psychiatric:        Behavior: Behavior normal.        Thought Content: Thought content normal.        Judgment: Judgment normal.   Simpson shoulder - mild pain w/ROM  Lab Results  Component Value Date   WBC 7.4 11/15/2023   HGB 11.8 (Simpson) 11/15/2023   HCT 34.3 (Simpson) 11/15/2023   PLT 335.0 11/15/2023   GLUCOSE 91 11/15/2023   CHOL 152 11/15/2023   TRIG 89.0 11/15/2023   HDL 56.10 11/15/2023   LDLDIRECT 137.5 03/17/2009   LDLCALC 78 11/15/2023   ALT 15 11/15/2023   AST 20 11/15/2023   NA 134 (Simpson) 11/15/2023   K 4.8 11/15/2023   CL 100 11/15/2023   CREATININE 0.89 11/15/2023   BUN 19 11/15/2023   CO2 30 11/15/2023   TSH 2.62 11/15/2023   INR 1.2 11/14/2022   HGBA1C 6.1 01/12/2022    DG Bone Density Result Date: 09/12/2023 EXAM: DUAL X-RAY ABSORPTIOMETRY (DXA) FOR BONE MINERAL DENSITY IMPRESSION: Referring Physician:  KARLYNN LULLA NOEL Your patient completed a bone  mineral density test using GE Lunar iDXA system (analysis version: 16). Technologist: BEC PATIENT: Name: Cindy Simpson Patient ID: [id number] Birth Date: 11/20/1945 Height: 59.5 in. Sex: Female Measured: 09/10/2023 Weight: 139.0 lbs.  Indications: Advanced Age, Bilateral Oophorectomy (65.51), Diabetic non insulin, Estrogen Deficient, Height Loss (781.91), Hypothyroid, Hysterectomy, Levothyroxine , Pantoprazole , Postmenopausal Fractures: NONE Treatments: Vitamin D  (E933.5) ASSESSMENT: The BMD measured at Femur Neck Right is 0.747 g/cm2 with a T-score of -2.1. This patient is considered osteopenic/low bone mass according to World Health Organization [facility]) criteria. L2 was excluded due to degenerative changes. The quality of the exam is good. Site Region Measured Date Measured Age YA BMD Significant CHANGE T-score DualFemur Neck Right 09/10/2023    78.1         -2.1    0.747 g/cm2 AP Spine  L1-L4 (L2) 09/10/2023    78.1         -1.2    1.022 g/cm2 DualFemur Total Mean 09/10/2023    78.1         -1.8    0.779 g/cm2 World Health Organization [facility]) criteria for post-menopausal, Caucasian Women: Normal       T-score at or above -1 SD Osteopenia   T-score between -1 and -2.5 SD Osteoporosis T-score at or below -2.5 SD RECOMMENDATION: 1. All patients should optimize calcium  and vitamin D  intake. 2. Consider FDA-approved medical therapies in postmenopausal women and men aged 69 years and older, based on the following: a. A hip or vertebral (clinical or morphometric) fracture. b. T-score = -2.5 at the femoral neck or spine after appropriate evaluation to exclude secondary causes. c. Low bone mass (T-score between -1.0 and -2.5 at the femoral neck or spine) and a 10-year probability of a hip fracture = 3% or a 10-year probability of a major osteoporosis-related fracture = 20% based on the [place] -adapted WHO algorithm. d. Clinician judgment and/or patient preferences may indicate treatment for people with 10-year fracture  probabilities above or below these levels. FOLLOW-UP: Patients with diagnosis of osteoporosis or at high risk for fracture should have regular bone mineral density tests. Patients eligible for Medicare are allowed routine testing every 2 years. The testing frequency can be increased to one year for patients who have rapidly progressing disease, are receiving or discontinuing medical therapy to restore bone mass, or have additional risk factors. I have reviewed this study and agree with the findings. [facility] FRAX* 10-year Probability of Fracture Based on femoral neck BMD: DualFemur (Right) Major Osteoporotic Fracture: 15.4% Hip Fracture:                4.5% Population:                  [place]  (Caucasian) Risk Factors:                None *FRAX is a Armed forces logistics/support/administrative officer of the [employer] for Metabolic Bone Disease, a World Science writer (WHO) [employer]. ASSESSMENT: The probability of a major osteoporotic fracture is 15.4% within the next ten years. The probability of a hip fracture is 4.5% within the next ten years. Electronically Signed   By: Rosaline Collet M.D.   On: 09/12/2023 07:49    Assessment & Plan:   Problem List Items Addressed This Visit     CRF (chronic renal failure), stage 3 (moderate) (HCC)   Monitor GFR. Hydrate well      Hyperglycemia   Check A1c      Relevant Orders   Microalbumin / creatinine urine ratio   Hypertension   Chronic On Coreg , Diovan       Relevant Orders   Comprehensive metabolic panel with GFR   Microalbumin / creatinine  urine ratio   Hypothyroidism - Primary   Chronic On Levothroid      Low back pain   Chronic due to OA ROM exercises         No orders of the defined types were placed in this encounter.     Follow-up: Return in about 3 months (around 05/17/2024) for a follow-up visit.  Marolyn Noel, MD

## 2024-02-15 NOTE — Assessment & Plan Note (Signed)
 Check A1c.

## 2024-02-15 NOTE — Assessment & Plan Note (Signed)
 Chronic On Levothroid -

## 2024-02-16 ENCOUNTER — Ambulatory Visit: Payer: Self-pay | Admitting: Internal Medicine

## 2024-04-09 ENCOUNTER — Other Ambulatory Visit: Payer: Self-pay | Admitting: Internal Medicine

## 2024-04-09 ENCOUNTER — Ambulatory Visit

## 2024-04-09 ENCOUNTER — Ambulatory Visit: Admitting: Podiatry

## 2024-04-09 DIAGNOSIS — B351 Tinea unguium: Secondary | ICD-10-CM | POA: Diagnosis not present

## 2024-04-09 DIAGNOSIS — M79675 Pain in left toe(s): Secondary | ICD-10-CM | POA: Diagnosis not present

## 2024-04-09 DIAGNOSIS — M79674 Pain in right toe(s): Secondary | ICD-10-CM

## 2024-04-09 DIAGNOSIS — L84 Corns and callosities: Secondary | ICD-10-CM | POA: Diagnosis not present

## 2024-04-09 DIAGNOSIS — E1149 Type 2 diabetes mellitus with other diabetic neurological complication: Secondary | ICD-10-CM

## 2024-04-09 DIAGNOSIS — I10 Essential (primary) hypertension: Secondary | ICD-10-CM

## 2024-04-09 NOTE — Progress Notes (Signed)
 Subjective: Chief Complaint  Patient presents with   Nail Problem    Pt stated that she is here to have her nails and calluses trimmed     78 year old female presents the office today for evaluation of painful calluses as well as toenails.  No recent injuries.  No radiating pain.  Denies any open lesions.  [name], Karlynn GAILS, MD Last seen 11/14/2023   Objective: AAO x3, NAD DP/PT pulses palpable bilaterally, CRT less than 3 seconds  Hyperkeratotic lesion submetatarsal 1, 3, 5 bilaterally.  No ongoing ulceration drainage or signs of infection.  There is no underlying ulceration drainage or any signs of infection. The nails are hypertrophic, dystrophic, discolored x 10 and are causing irritation/pain. No redness, swelling, drainage.  No open lesions or pre-ulcerative lesions.  Bunions present. Prominent metatarsal heads with atrophy of the fat pad and thinning of skin.  No pain with calf compression, swelling, warmth, erythema  Assessment: Hyperkeratotic lesions/preulcerative calluses, symptomatic onychomycosis  Plan: -All treatment options discussed with the patient including all alternatives, risks, complications.  -Debrided the calluses x 6 with mild bleeding at right sub 1st MTP. Abx ointment and bandaid applied. May need to apply abx ointment and bandaid one more time.  Continue moisturizer, offloading. -Nails debrided x10 with minor bleeding at distal 3rd digit. Abx ointment and bandaid applied. May need to apply abx ointment and bandaid one more time.  -Given the prominence of metatarsal heads continue with metatarsal support, offloading. -Discussed daily foot inspection

## 2024-05-02 ENCOUNTER — Encounter (HOSPITAL_BASED_OUTPATIENT_CLINIC_OR_DEPARTMENT_OTHER): Payer: Self-pay | Admitting: *Deleted

## 2024-05-02 ENCOUNTER — Other Ambulatory Visit: Payer: Self-pay

## 2024-05-02 ENCOUNTER — Emergency Department (HOSPITAL_BASED_OUTPATIENT_CLINIC_OR_DEPARTMENT_OTHER)
Admission: EM | Admit: 2024-05-02 | Discharge: 2024-05-02 | Disposition: A | Discharge status: Home / Self Care | Attending: Emergency Medicine | Admitting: Emergency Medicine

## 2024-05-02 DIAGNOSIS — Y92019 Unspecified place in single-family (private) house as the place of occurrence of the external cause: Secondary | ICD-10-CM | POA: Insufficient documentation

## 2024-05-02 DIAGNOSIS — W228XXA Striking against or struck by other objects, initial encounter: Secondary | ICD-10-CM | POA: Diagnosis not present

## 2024-05-02 DIAGNOSIS — Z79899 Other long term (current) drug therapy: Secondary | ICD-10-CM | POA: Diagnosis not present

## 2024-05-02 DIAGNOSIS — Z7982 Long term (current) use of aspirin: Secondary | ICD-10-CM | POA: Insufficient documentation

## 2024-05-02 DIAGNOSIS — S0501XA Injury of conjunctiva and corneal abrasion without foreign body, right eye, initial encounter: Secondary | ICD-10-CM | POA: Insufficient documentation

## 2024-05-02 DIAGNOSIS — R609 Edema, unspecified: Secondary | ICD-10-CM | POA: Diagnosis not present

## 2024-05-02 DIAGNOSIS — S0591XA Unspecified injury of right eye and orbit, initial encounter: Secondary | ICD-10-CM | POA: Diagnosis present

## 2024-05-02 MED ORDER — TETRACAINE HCL 0.5 % OP SOLN
2.0000 [drp] | Freq: Once | OPHTHALMIC | Status: AC
  Administered 2024-05-02: 2 [drp] via OPHTHALMIC
  Filled 2024-05-02: qty 4

## 2024-05-02 MED ORDER — ERYTHROMYCIN 5 MG/GM OP OINT: TOPICAL_OINTMENT | OPHTHALMIC | 0 refills | Status: AC

## 2024-05-02 MED ORDER — FLUORESCEIN SODIUM 1 MG OP STRP
1.0000 | ORAL_STRIP | Freq: Once | OPHTHALMIC | Status: AC
  Administered 2024-05-02: 1 via OPHTHALMIC
  Filled 2024-05-02: qty 1

## 2024-05-02 MED ORDER — ERYTHROMYCIN 5 MG/GM OP OINT: TOPICAL_OINTMENT | OPHTHALMIC | 0 refills | Status: DC

## 2024-05-02 NOTE — ED Provider Notes (Signed)
 [place] [facility]ERGENCY DEPARTMENT AT [facility] Provider Note   CSN: [id number] Arrival date & time: 05/02/24  1816     Patient presents with: Eye Pain   Cindy Simpson is a 78 y.o. female.  Patient presents to the emergency department with concerns of right eye pain.  She reports that she ran into a tree branch that had pine needles on it and had some scratch that was sustained earlier this afternoon.  She states that she tried to irrigate the eye out without significant improvement in pain.  Denies any significant vision loss but states that she has difficulty opening the eye due to pain.  States that the left eye is at baseline.   Eye Pain       Prior to Admission medications   Medication Sig Start Date End Date Taking? Authorizing Provider  Aspirin  81 MG CAPS Take 1 tablet by mouth daily.    [provider]  atorvastatin  (LIPITOR) 20 MG tablet TAKE 1 TABLET BY MOUTH EVERY DAY 12/13/23   [name], Aleksei V, MD  bismuth subsalicylate (PEPTO BISMOL) 262 MG/15ML suspension Take 30 mLs by mouth every 6 (six) hours as needed for indigestion.    [provider]  carvedilol  (COREG ) 6.25 MG tablet TAKE 1 TABLET BY MOUTH TWICE  DAILY WITH A MEAL 09/26/23   [name], Aleksei V, MD  cetirizine (ZYRTEC) 10 MG tablet Take 10 mg by mouth at bedtime.    [provider]  Cholecalciferol  1000 UNITS tablet Take 2,000 Units by mouth daily after supper. Informed to start 2000 units daily 10/13/10   [name], Aleksei V, MD  Continuous Blood Gluc Receiver (FREESTYLE LIBRE 2 READER) DEVI 1 Units by Does not apply route every 14 (fourteen) days. 10/12/21   [name], Aleksei V, MD  Continuous Blood Gluc Sensor (FREESTYLE LIBRE 2 SENSOR) MISC 1 Units by Does not apply route every 14 (fourteen) days. 10/12/21   [name], Aleksei V, MD  diclofenac  sodium (VOLTAREN ) 1 % GEL Apply 2 g topically 4 (four) times daily. Patient taking differently: Apply 2 g topically daily as  needed (for pain). 01/24/19   [name], Aleksei V, MD  erythromycin ophthalmic ointment Place a 1/2 inch ribbon of ointment into the lower eyelid of the right eye four times daily. 05/02/24   Marykathryn Carboni A, PA-C  FLUZONE HIGH-DOSE QUADRIVALENT 0.7 ML SUSY  03/25/22   [provider]  glucose blood (ACCU-CHEK AVIVA) test strip Use to check blood sugars twice a day 10/25/16   [name], Aleksei V, MD  ipratropium (ATROVENT) 0.03 % nasal spray Place 2 sprays into both nostrils 2 (two) times daily as needed. 02/01/24   [provider]  Lancets (ACCU-CHEK SOFT TOUCH) lancets Use to check blood sugars twice a day 10/25/16   [name], Aleksei V, MD  levothyroxine  (SYNTHROID ) 100 MCG tablet Take 100 mcg by mouth at bedtime. 10/03/19   [provider]  LORazepam  (ATIVAN ) 0.5 MG tablet Take 1 tablet (0.5 mg total) by mouth every 6 (six) hours as needed for anxiety (anxiety or panic feeling). 11/24/22   [name], Aleksei V, MD  pantoprazole  (PROTONIX ) 40 MG tablet TAKE 1 TABLET BY MOUTH TWICE  DAILY 09/26/23   [name], Aleksei V, MD  valsartan  (DIOVAN ) 80 MG tablet TAKE 1 TABLET BY MOUTH DAILY 04/11/24   [name], Aleksei V, MD    Allergies: Lisinopril , Vinegar [acetic acid], and Metoclopramide hcl    Review of Systems  Eyes:  Positive for pain.  All other systems  reviewed and are negative.   Updated Vital Signs BP (!) 155/65 (BP Location: Right Arm)   Pulse 64   Temp 97.8 F (36.6 C) (Oral)   Resp 14   SpO2 96%   Physical Exam Vitals and nursing note reviewed.  Constitutional:      General: She is not in acute distress.    Appearance: She is well-developed.  HENT:     Head: Normocephalic and atraumatic.  Eyes:     General: No scleral icterus.       Right eye: Discharge present. No foreign body or hordeolum.        Left eye: No foreign body, discharge or hordeolum.     Intraocular pressure: Right eye pressure is 18 mmHg. Measurements were taken using a handheld  tonometer.    Extraocular Movements: Extraocular movements intact.     Right eye: Normal extraocular motion and no nystagmus.     Left eye: Normal extraocular motion and no nystagmus.     Conjunctiva/sclera:     Right eye: Right conjunctiva is injected. No chemosis, exudate or hemorrhage.    Pupils: Pupils are equal, round, and reactive to light.      Comments: Irregular circular shaped corneal abrasion to the right eye just superior to the right pupil without obvious signs of penetrating injury. No evidence of globe rupture and normal IOP of 18 in the right eye.  Skin:    General: Skin is warm and dry.  Neurological:     Mental Status: She is alert.  Psychiatric:        Mood and Affect: Mood normal.     (all labs ordered are listed, but only abnormal results are displayed) Labs Reviewed - No data to display  EKG: None  Radiology: No results found.   Procedures   Medications Ordered in the ED  tetracaine (PONTOCAINE) 0.5 % ophthalmic solution 2 drop (2 drops Right Eye Given 05/02/24 1830)  fluorescein ophthalmic strip 1 strip (1 strip Right Eye Given 05/02/24 1830)                                    Medical Decision Making Risk Prescription drug management.   This patient presents to the ED for concern of eye pain.  Differential diagnosis includes corneal abrasion, foreign body in the eye, corneal laceration, globe rupture   Medicines ordered and prescription drug management:  I ordered medication including fluorescein, tetracaine for ophthalmic examination Reevaluation of the patient after these medicines showed that the patient improved due to anesthetic effect I have reviewed the patients home medicines and have made adjustments as needed   Problem List / ED Course:  Patient presented to the emergency department today with concerns of an eye injury.  Reports that she was struck against a tree branch and has pain in the right eye.  States pain typically  worsens when try to open the eye but denies any significant vision loss or disturbance.  She does not wear contact lenses.  She reports that she tried to reach out to her ophthalmologist today was unable to get a hold of them. On guided exam with fluorescein staining, appears the patient has a corneal abrasion present just superior to the right pupil.  Measuring somewhere around 1mm in circumference. Given this finding, will start patient on a course of antibiotic ointment, erythromycin, per patient preference.  Advise close follow-up with her ophthalmologist within  the next 2 days for further assessment to ensure no complications from wound healing of the corneal abrasion that she has sustained.  Advised use of cold compress of dressing over the area to help address some of the pain that she may be endorsing.  Otherwise encouraged Tylenol  ibuprofen  use.  She is otherwise stable at this time for outpatient follow-up and discharged home.   Social Determinants of Health:  None  Final diagnoses:  Abrasion of right cornea, initial encounter    ED Discharge Orders          Ordered    erythromycin ophthalmic ointment  Status:  Discontinued        05/02/24 1851    erythromycin ophthalmic ointment        05/02/24 1852               Cecily Legrand DELENA DEVONNA 05/02/24 JACKEY Randol Simmonds, MD 05/03/24 1136

## 2024-05-02 NOTE — Discharge Instructions (Addendum)
 You were seen in the emergency department for eye pain.  On our exam, you have sustained a corneal abrasion to the right eye.  I have started you on an antibiotic ointment to apply 5 times daily for the next 3 days.  You should follow-up closely with your ophthalmologist for further evaluation within the next 1 to 2 days.  For any concerns of significantly worsening symptoms, return to the emergency department.

## 2024-05-02 NOTE — ED Notes (Signed)
 ED Provider at bedside.

## 2024-05-02 NOTE — ED Triage Notes (Signed)
 Pt to ED with right eye pain after a tree branch hit her. Eye was irrigated at home without relief to the pain.

## 2024-05-03 ENCOUNTER — Encounter: Payer: Self-pay | Admitting: Internal Medicine

## 2024-05-03 ENCOUNTER — Ambulatory Visit

## 2024-05-03 DIAGNOSIS — Z23 Encounter for immunization: Secondary | ICD-10-CM | POA: Diagnosis not present

## 2024-05-03 NOTE — Progress Notes (Cosign Needed Addendum)
 Pt received there flu shot today with no complications   Medical screening examination/treatment/procedure(s) were performed by non-physician practitioner and as supervising physician I was immediately available for consultation/collaboration.  I agree with above. Karlynn Noel, MD

## 2024-05-10 DIAGNOSIS — Z1231 Encounter for screening mammogram for malignant neoplasm of breast: Secondary | ICD-10-CM | POA: Diagnosis not present

## 2024-05-10 LAB — HM MAMMOGRAPHY

## 2024-05-17 ENCOUNTER — Ambulatory Visit: Admitting: Internal Medicine

## 2024-05-17 ENCOUNTER — Encounter: Payer: Self-pay | Admitting: Internal Medicine

## 2024-05-17 VITALS — BP 98/62 | HR 66 | Temp 98.1°F | Ht 61.0 in | Wt 142.4 lb

## 2024-05-17 DIAGNOSIS — K0510 Chronic gingivitis, plaque induced: Secondary | ICD-10-CM | POA: Diagnosis not present

## 2024-05-17 DIAGNOSIS — S00502A Unspecified superficial injury of oral cavity, initial encounter: Secondary | ICD-10-CM

## 2024-05-17 DIAGNOSIS — J3089 Other allergic rhinitis: Secondary | ICD-10-CM

## 2024-05-17 DIAGNOSIS — E034 Atrophy of thyroid (acquired): Secondary | ICD-10-CM | POA: Diagnosis not present

## 2024-05-17 DIAGNOSIS — I10 Essential (primary) hypertension: Secondary | ICD-10-CM

## 2024-05-17 DIAGNOSIS — N183 Chronic kidney disease, stage 3 unspecified: Secondary | ICD-10-CM | POA: Diagnosis not present

## 2024-05-17 DIAGNOSIS — R739 Hyperglycemia, unspecified: Secondary | ICD-10-CM

## 2024-05-17 LAB — COMPREHENSIVE METABOLIC PANEL WITH GFR
ALT: 14 U/L (ref 0–35)
AST: 21 U/L (ref 0–37)
Albumin: 4.3 g/dL (ref 3.5–5.2)
Alkaline Phosphatase: 120 U/L — ABNORMAL HIGH (ref 39–117)
BUN: 24 mg/dL — ABNORMAL HIGH (ref 6–23)
CO2: 27 meq/L (ref 19–32)
Calcium: 9.5 mg/dL (ref 8.4–10.5)
Chloride: 98 meq/L (ref 96–112)
Creatinine, Ser: 1.04 mg/dL (ref 0.40–1.20)
GFR: 51.35 mL/min — ABNORMAL LOW (ref >60.00)
Glucose, Bld: 91 mg/dL (ref 70–99)
Potassium: 4.6 meq/L (ref 3.5–5.1)
Sodium: 134 meq/L — ABNORMAL LOW (ref 135–145)
Total Bilirubin: 0.7 mg/dL (ref 0.2–1.2)
Total Protein: 7.4 g/dL (ref 6.0–8.3)

## 2024-05-17 LAB — CBC WITH DIFFERENTIAL/PLATELET
Basophils Absolute: 0.1 K/uL (ref 0.0–0.1)
Basophils Relative: 0.8 % (ref 0.0–3.0)
Eosinophils Absolute: 0.2 K/uL (ref 0.0–0.7)
Eosinophils Relative: 2 % (ref 0.0–5.0)
HCT: 35 % — ABNORMAL LOW (ref 36.0–46.0)
Hemoglobin: 11.8 g/dL — ABNORMAL LOW (ref 12.0–15.0)
Lymphocytes Relative: 22 % (ref 12.0–46.0)
Lymphs Abs: 1.7 K/uL (ref 0.7–4.0)
MCHC: 33.6 g/dL (ref 30.0–36.0)
MCV: 93.1 fl (ref 78.0–100.0)
Monocytes Absolute: 0.8 K/uL (ref 0.1–1.0)
Monocytes Relative: 10.5 % (ref 3.0–12.0)
Neutro Abs: 5 K/uL (ref 1.4–7.7)
Neutrophils Relative %: 64.7 % (ref 43.0–77.0)
Platelets: 368 K/uL (ref 150.0–400.0)
RBC: 3.76 Mil/uL — ABNORMAL LOW (ref 3.87–5.11)
RDW: 13.7 % (ref 11.5–15.5)
WBC: 7.8 K/uL (ref 4.0–10.5)

## 2024-05-17 LAB — HEMOGLOBIN A1C: Hgb A1c MFr Bld: 6 % (ref 4.6–6.5)

## 2024-05-17 LAB — TSH: TSH: 2.82 u[IU]/mL (ref 0.35–5.50)

## 2024-05-17 MED ORDER — AMOXICILLIN 500 MG PO CAPS: 500.0000 mg | ORAL_CAPSULE | Freq: Three times a day (TID) | ORAL | 1 refills | Status: AC

## 2024-05-17 NOTE — Assessment & Plan Note (Signed)
 Chronic On Levothroid -

## 2024-05-17 NOTE — Assessment & Plan Note (Signed)
 Check A1c.

## 2024-05-17 NOTE — Progress Notes (Signed)
 Subjective:  Patient ID: Cindy Simpson, female    DOB: 1945-09-11  Age: 78 y.o. MRN: [id number]  CC: Medical Management of Chronic Issues (3 Month follow up. Recent tooth extraction on right side. Wants to make sure it is not infected)   HPI Cindy Simpson presents for 3 Month follow up. Recent tooth extraction on right side. Wants to make sure it is not infected F/u on dyslipidemia, runny nose, HTN, GERD  Outpatient Medications Prior to Visit  Medication Sig Dispense Refill   Aspirin  81 MG CAPS Take 1 tablet by mouth daily.     atorvastatin  (LIPITOR) 20 MG tablet TAKE 1 TABLET BY MOUTH EVERY DAY 90 tablet 3   bismuth subsalicylate (PEPTO BISMOL) 262 MG/15ML suspension Take 30 mLs by mouth every 6 (six) hours as needed for indigestion.     carvedilol  (COREG ) 6.25 MG tablet TAKE 1 TABLET BY MOUTH TWICE  DAILY WITH A MEAL 200 tablet 2   cetirizine (ZYRTEC) 10 MG tablet Take 10 mg by mouth at bedtime.     Cholecalciferol  1000 UNITS tablet Take 2,000 Units by mouth daily after supper. Informed to start 2000 units daily     Continuous Blood Gluc Receiver (FREESTYLE LIBRE 2 READER) DEVI 1 Units by Does not apply route every 14 (fourteen) days. 1 each 0   Continuous Blood Gluc Sensor (FREESTYLE LIBRE 2 SENSOR) MISC 1 Units by Does not apply route every 14 (fourteen) days. 6 each 3   diclofenac  sodium (VOLTAREN ) 1 % GEL Apply 2 g topically 4 (four) times daily. (Patient taking differently: Apply 2 g topically daily as needed (for pain).) 100 g 3   erythromycin ophthalmic ointment Place a 1/2 inch ribbon of ointment into the lower eyelid of the right eye four times daily. 3.5 g 0   FLUZONE HIGH-DOSE QUADRIVALENT 0.7 ML SUSY      glucose blood (ACCU-CHEK AVIVA) test strip Use to check blood sugars twice a day 100 each 5   ipratropium (ATROVENT) 0.03 % nasal spray Place 2 sprays into both nostrils 2 (two) times daily as needed.     Lancets (ACCU-CHEK SOFT TOUCH) lancets Use to check blood sugars  twice a day 100 each 5   levothyroxine  (SYNTHROID ) 100 MCG tablet Take 100 mcg by mouth at bedtime.     LORazepam  (ATIVAN ) 0.5 MG tablet Take 1 tablet (0.5 mg total) by mouth every 6 (six) hours as needed for anxiety (anxiety or panic feeling). 30 tablet 0   pantoprazole  (PROTONIX ) 40 MG tablet TAKE 1 TABLET BY MOUTH TWICE  DAILY 200 tablet 2   valsartan  (DIOVAN ) 80 MG tablet TAKE 1 TABLET BY MOUTH DAILY 100 tablet 2   No facility-administered medications prior to visit.    ROS: Review of Systems  Constitutional:  Positive for fatigue. Negative for activity change, appetite change, chills and unexpected weight change.  HENT:  Positive for dental problem. Negative for congestion, mouth sores and sinus pressure.   Eyes:  Negative for visual disturbance.  Respiratory:  Negative for cough and chest tightness.   Gastrointestinal:  Negative for abdominal pain and nausea.  Genitourinary:  Negative for difficulty urinating, frequency and vaginal pain.  Musculoskeletal:  Negative for back pain and gait problem.  Skin:  Negative for pallor and rash.  Neurological:  Negative for dizziness, tremors, weakness, numbness and headaches.  Psychiatric/Behavioral:  Negative for confusion and sleep disturbance.     Objective:  BP 98/62   Pulse 66   Temp 98.1  F (36.7 C)   Ht 5' 1 (1.549 m)   Wt 142 lb 6.4 oz (64.6 kg)   SpO2 99%   BMI 26.91 kg/m   BP Readings from Last 3 Encounters:  05/17/24 98/62  05/02/24 (!) 155/65  02/15/24 110/70    Wt Readings from Last 3 Encounters:  05/17/24 142 lb 6.4 oz (64.6 kg)  02/15/24 139 lb 4 oz (63.2 kg)  11/14/23 139 lb 6.4 oz (63.2 kg)    Physical Exam Constitutional:      General: She is not in acute distress.    Appearance: She is well-developed. She is not toxic-appearing.  HENT:     Head: Normocephalic.     Right Ear: External ear normal.     Left Ear: External ear normal.     Nose: Nose normal.  Eyes:     General:        Right eye: No  discharge.        Left eye: No discharge.     Conjunctiva/sclera: Conjunctivae normal.     Pupils: Pupils are equal, round, and reactive to light.  Neck:     Thyroid : No thyromegaly.     Vascular: No JVD.     Trachea: No tracheal deviation.  Cardiovascular:     Rate and Rhythm: Normal rate and regular rhythm.     Heart sounds: Normal heart sounds.  Pulmonary:     Effort: No respiratory distress.     Breath sounds: No stridor. No wheezing.  Abdominal:     General: Bowel sounds are normal. There is no distension.     Palpations: Abdomen is soft. There is no mass.     Tenderness: There is no abdominal tenderness. There is no guarding or rebound.  Musculoskeletal:        General: No tenderness.     Cervical back: Normal range of motion and neck supple. No rigidity.  Lymphadenopathy:     Cervical: No cervical adenopathy.  Skin:    Findings: No erythema or rash.  Neurological:     Cranial Nerves: No cranial nerve deficit.     Motor: No abnormal muscle tone.     Coordination: Coordination normal.     Deep Tendon Reflexes: Reflexes normal.  Psychiatric:        Behavior: Behavior normal.        Thought Content: Thought content normal.        Judgment: Judgment normal.   Swelling under R jaw, tender  Lab Results  Component Value Date   WBC 7.4 11/15/2023   HGB 11.8 (L) 11/15/2023   HCT 34.3 (L) 11/15/2023   PLT 335.0 11/15/2023   GLUCOSE 91 02/15/2024   CHOL 152 11/15/2023   TRIG 89.0 11/15/2023   HDL 56.10 11/15/2023   LDLDIRECT 137.5 03/17/2009   LDLCALC 78 11/15/2023   ALT 15 02/15/2024   AST 20 02/15/2024   NA 129 (L) 02/15/2024   K 4.6 02/15/2024   CL 95 (L) 02/15/2024   CREATININE 0.98 02/15/2024   BUN 18 02/15/2024   CO2 28 02/15/2024   TSH 2.62 11/15/2023   INR 1.2 11/14/2022   HGBA1C 6.1 01/12/2022   MICROALBUR <0.7 02/15/2024    No results found.  Assessment & Plan:   Problem List Items Addressed This Visit     Allergic rhinitis   On Atrovent  nasal spray      CRF (chronic renal failure), stage 3 (moderate) (HCC)   Monitor GFR. Hydrate well  Relevant Orders   CBC with Differential/Platelet   Comprehensive metabolic panel with GFR   Hemoglobin A1c   TSH   Hyperglycemia   Check A1c      Relevant Orders   Hemoglobin A1c   Hypertension   Low BP. No sx's      Hypothyroidism   Chronic On Levothroid      Relevant Orders   TSH   Superficial injury of gum with infection - Primary   S/p R lower molar extraction - new pain, swelling Start Amoxicillin       Relevant Orders   CBC with Differential/Platelet      Meds ordered this encounter  Medications   amoxicillin  (AMOXIL ) 500 MG capsule    Sig: Take 1 capsule (500 mg total) by mouth 3 (three) times daily.    Dispense:  21 capsule    Refill:  1      Follow-up: Return in about 3 months (around 08/17/2024) for a follow-up visit.  Marolyn Noel, MD

## 2024-05-17 NOTE — Assessment & Plan Note (Signed)
 Monitor GFR Hydrate well

## 2024-05-17 NOTE — Assessment & Plan Note (Signed)
 S/p R lower molar extraction - new pain, swelling Start Amoxicillin 

## 2024-05-17 NOTE — Assessment & Plan Note (Signed)
 Low BP. No sx's

## 2024-05-17 NOTE — Assessment & Plan Note (Signed)
 On Atrovent nasal spray

## 2024-05-19 ENCOUNTER — Ambulatory Visit: Payer: Self-pay | Admitting: Internal Medicine

## 2024-05-21 ENCOUNTER — Encounter: Payer: Self-pay | Admitting: Internal Medicine

## 2024-05-21 DIAGNOSIS — E034 Atrophy of thyroid (acquired): Secondary | ICD-10-CM

## 2024-05-21 DIAGNOSIS — E785 Hyperlipidemia, unspecified: Secondary | ICD-10-CM

## 2024-06-17 ENCOUNTER — Other Ambulatory Visit: Payer: Self-pay | Admitting: Internal Medicine

## 2024-07-04 ENCOUNTER — Encounter: Payer: Self-pay | Admitting: Internal Medicine

## 2024-07-05 ENCOUNTER — Other Ambulatory Visit: Payer: Self-pay | Admitting: Internal Medicine

## 2024-07-05 DIAGNOSIS — M25519 Pain in unspecified shoulder: Secondary | ICD-10-CM

## 2024-07-09 ENCOUNTER — Ambulatory Visit: Admitting: Podiatry

## 2024-07-09 ENCOUNTER — Encounter: Payer: Self-pay | Admitting: Podiatry

## 2024-07-09 DIAGNOSIS — B351 Tinea unguium: Secondary | ICD-10-CM

## 2024-07-09 DIAGNOSIS — M79674 Pain in right toe(s): Secondary | ICD-10-CM | POA: Diagnosis not present

## 2024-07-09 DIAGNOSIS — Q828 Other specified congenital malformations of skin: Secondary | ICD-10-CM

## 2024-07-09 DIAGNOSIS — M79675 Pain in left toe(s): Secondary | ICD-10-CM

## 2024-07-09 DIAGNOSIS — E1149 Type 2 diabetes mellitus with other diabetic neurological complication: Secondary | ICD-10-CM | POA: Diagnosis not present

## 2024-07-09 NOTE — Progress Notes (Signed)
 Subjective: Chief Complaint  Patient presents with   Callouses    Bilateral foot calluses. Non diabetic. 0 pain. Debridement to treat.    78 year old female presents the office today for evaluation of painful calluses as well as toenails.  She said the nails and calluses are causing discomfort that becoming thick and long.  She did injure her left big toe yesterday but not bad enough where she needs an x-ray she reports.  [name], Karlynn GAILS, MD Last seen 11/14/2023   Objective: AAO x3, NAD DP/PT pulses palpable bilaterally, CRT less than 3 seconds  Hyperkeratotic lesion submetatarsal 1, 3, 5 bilaterally.  No ongoing ulceration drainage or signs of infection.  There is no underlying ulceration drainage or any signs of infection. The nails are hypertrophic, dystrophic, discolored x 10 and are causing irritation/pain. No redness, swelling, drainage.  No open lesions or pre-ulcerative lesions.  Bunions present. Prominent metatarsal heads with atrophy of the fat pad.  Mild tenderness palpation on the base of the hallux but no bruising present there is no open lesions.  No deformity present. No pain with calf compression, swelling, warmth, erythema  Assessment: Hyperkeratotic lesions/preulcerative calluses, symptomatic onychomycosis; likely contusion left hallux  Plan: -All treatment options discussed with the patient including all alternatives, risks, complications.  -Debrided the calluses x 6 without complications or bleeding.  Continue moisturizer, offloading. -Nails debrided x10 without complications or bleeding. -Given the prominence of metatarsal heads continue with metatarsal support, offloading. -Offered x-rays for the left side.  If no improvement in next couple weeks let me know or sooner if there is any changes or worsening. -Discussed daily foot inspection  Return in about 9 weeks (around 09/10/2024).   Cindy Simpson Fees DPM

## 2024-07-23 NOTE — Progress Notes (Signed)
 "               [facility] [address] [place] [address] Phone: [phone]   Assessment and Plan:     1. Chronic left shoulder pain (Primary) 2. Tendinopathy of left shoulder -Chronic with exacerbation, initial visit - Left shoulder pain for 3 years, worsening since fall last month.  Most consistent with left rotator cuff tendinopathy without large degree tear or fracture - X-ray obtained in clinic.  My interpretation: No acute fracture or dislocation.  Mild glenohumeral spurring, moderate AC joint degenerative changes - If possible, recommend avoiding p.o. NSAIDs or prednisone  with past medical history hypertension, CAD, CKD - Start HEP for rotator cuff - Patient elected for subacromial CSI.  Tolerated well per note below.  CSI may temporarily increase blood pressure and patient with past medical history of hypertension  Procedure: Subacromial Injection Side: Left  Risks explained and consent was given verbally. The site was cleaned with alcohol prep. A steroid injection was performed from posterior approach using 2mL of 1% lidocaine  without epinephrine and 1mL of kenalog  40mg /ml. This was well tolerated.  Needle was removed, hemostasis achieved, and post injection instructions were explained.   Pt was advised to call or return to clinic if these symptoms worsen or fail to improve as anticipated.   3. Chronic left-sided thoracic back pain -Chronic with exacerbation, initial visit - Intermittent mid to left thoracolumbar pain most consistent with exaggerated thoracic kyphosis, likely degenerative changes - Start HEP for back - Recommend sitting to rest when pain flares - Use Tylenol  500 to 1000 mg tablets 2-3 times a day for day-to-day pain relief  15 additional minutes spent for educating Therapeutic Home Exercise Program.  This included exercises focusing on stretching, strengthening, with focus on eccentric aspects.   Long term goals  include an improvement in range of motion, strength, endurance as well as avoiding reinjury. Patient's frequency would include in 1-2 times a day, 3-5 times a week for a duration of 6-12 weeks. Proper technique shown and discussed handout in great detail with ATC.  All questions were discussed and answered.     Pertinent previous records reviewed include none   Follow Up: 4 weeks for reevaluation.  Could consider physical therapy referral versus thoracic x-ray   Subjective:   I, Cindy Simpson, am serving as a neurosurgeon for Doctor Morene Mace  Chief Complaint: left shoulder pain   HPI:   07/24/2024 Patient is a 79 year old female with left shoulder pain. Patient states pain started 3 years ago. She fell a couple of weeks ago. Decreased ROM. Tylenol  and ibu for the pain. Pain radiates down to the elbow. She isnt able to sleep through the night. No numbness or tingling.    Relevant Historical Information: Hypertension, CAD, GERD, CKD 3  Additional pertinent review of systems negative.  Current Medications[1]   Objective:     Vitals:   07/24/24 1018  Pulse: 88  SpO2: 95%  Weight: 143 lb (64.9 kg)  Height: 5' 1 (1.549 m)      Body mass index is 27.02 kg/m.    Physical Exam:    Gen: Appears well, nad, nontoxic and pleasant Neuro:sensation intact, strength is 5/5, muscle tone wnl Skin: no suspicious lesion or defmority Psych: A&O, appropriate mood and affect  Left shoulder:  No deformity, swelling or muscle wasting No scapular winging FF 160, abd 160, int 10, ext 80 NTTP over the Clover, clavicle,  ac, coracoid, biceps groove, humerus, deltoid, trapezius, cervical spine Positive neer, hawkins, empty can, obriens, crossarm, Negative subscap liftoff, speeds Neg ant drawer, sulcus sign, apprehension Negative Spurling's test bilat FROM of neck   NTTP thoracolumbar paraspinal, thoracolumbar spinous processes.  Mild left thoracic discomfort with back extension.  No pain with  flexion  Electronically signed by:  [facility] 11:27 AM 07/24/2024     [1]  Current Outpatient Medications:    amoxicillin  (AMOXIL ) 500 MG capsule, Take 1 capsule (500 mg total) by mouth 3 (three) times daily. (Patient not taking: Reported on 07/09/2024), Disp: 21 capsule, Rfl: 1   Aspirin  81 MG CAPS, Take 1 tablet by mouth daily., Disp: , Rfl:    atorvastatin  (LIPITOR) 20 MG tablet, TAKE 1 TABLET BY MOUTH EVERY DAY, Disp: 90 tablet, Rfl: 3   bismuth subsalicylate (PEPTO BISMOL) 262 MG/15ML suspension, Take 30 mLs by mouth every 6 (six) hours as needed for indigestion., Disp: , Rfl:    carvedilol  (COREG ) 6.25 MG tablet, TAKE 1 TABLET BY MOUTH TWICE  DAILY WITH A MEAL, Disp: 200 tablet, Rfl: 2   cetirizine (ZYRTEC) 10 MG tablet, Take 10 mg by mouth at bedtime., Disp: , Rfl:    Cholecalciferol  1000 UNITS tablet, Take 2,000 Units by mouth daily after supper. Informed to start 2000 units daily, Disp: , Rfl:    Continuous Blood Gluc Receiver (FREESTYLE LIBRE 2 READER) DEVI, 1 Units by Does not apply route every 14 (fourteen) days., Disp: 1 each, Rfl: 0   Continuous Blood Gluc Sensor (FREESTYLE LIBRE 2 SENSOR) MISC, 1 Units by Does not apply route every 14 (fourteen) days., Disp: 6 each, Rfl: 3   diclofenac  sodium (VOLTAREN ) 1 % GEL, Apply 2 g topically 4 (four) times daily. (Patient taking differently: Apply 2 g topically daily as needed (for pain).), Disp: 100 g, Rfl: 3   erythromycin  ophthalmic ointment, Place a 1/2 inch ribbon of ointment into the lower eyelid of the right eye four times daily., Disp: 3.5 g, Rfl: 0   FLUZONE HIGH-DOSE QUADRIVALENT 0.7 ML SUSY, , Disp: , Rfl:    glucose blood (ACCU-CHEK AVIVA) test strip, Use to check blood sugars twice a day, Disp: 100 each, Rfl: 5   ipratropium (ATROVENT) 0.03 % nasal spray, Place 2 sprays into both nostrils 2 (two) times daily as needed., Disp: , Rfl:    Lancets (ACCU-CHEK SOFT TOUCH) lancets, Use to check  blood sugars twice a day, Disp: 100 each, Rfl: 5   levothyroxine  (SYNTHROID ) 100 MCG tablet, Take 100 mcg by mouth at bedtime., Disp: , Rfl:    LORazepam  (ATIVAN ) 0.5 MG tablet, Take 1 tablet (0.5 mg total) by mouth every 6 (six) hours as needed for anxiety (anxiety or panic feeling)., Disp: 30 tablet, Rfl: 0   pantoprazole  (PROTONIX ) 40 MG tablet, TAKE 1 TABLET BY MOUTH TWICE  DAILY, Disp: 200 tablet, Rfl: 2   valsartan  (DIOVAN ) 80 MG tablet, TAKE 1 TABLET BY MOUTH DAILY, Disp: 100 tablet, Rfl: 2  "

## 2024-07-24 ENCOUNTER — Ambulatory Visit

## 2024-07-24 ENCOUNTER — Ambulatory Visit: Admitting: Sports Medicine

## 2024-07-24 VITALS — HR 88 | Ht 61.0 in | Wt 143.0 lb

## 2024-07-24 DIAGNOSIS — M25512 Pain in left shoulder: Secondary | ICD-10-CM | POA: Diagnosis not present

## 2024-07-24 DIAGNOSIS — M546 Pain in thoracic spine: Secondary | ICD-10-CM | POA: Diagnosis not present

## 2024-07-24 DIAGNOSIS — M67912 Unspecified disorder of synovium and tendon, left shoulder: Secondary | ICD-10-CM | POA: Diagnosis not present

## 2024-07-24 DIAGNOSIS — G8929 Other chronic pain: Secondary | ICD-10-CM

## 2024-07-24 NOTE — Patient Instructions (Addendum)
 Shoulder HEP   Heating pads over areas of pain   4 week follow up

## 2024-08-01 ENCOUNTER — Ambulatory Visit: Payer: Self-pay | Admitting: Sports Medicine

## 2024-08-20 ENCOUNTER — Ambulatory Visit: Admitting: Internal Medicine

## 2024-08-20 ENCOUNTER — Telehealth: Payer: Self-pay

## 2024-08-21 ENCOUNTER — Ambulatory Visit: Admitting: Sports Medicine

## 2024-08-21 ENCOUNTER — Ambulatory Visit: Payer: Self-pay | Admitting: Sports Medicine

## 2024-08-21 ENCOUNTER — Ambulatory Visit (INDEPENDENT_AMBULATORY_CARE_PROVIDER_SITE_OTHER)

## 2024-08-21 VITALS — BP 122/84 | HR 84 | Ht 61.0 in | Wt 143.0 lb

## 2024-08-21 DIAGNOSIS — G8929 Other chronic pain: Secondary | ICD-10-CM

## 2024-08-21 DIAGNOSIS — M546 Pain in thoracic spine: Secondary | ICD-10-CM

## 2024-08-21 DIAGNOSIS — M67912 Unspecified disorder of synovium and tendon, left shoulder: Secondary | ICD-10-CM

## 2024-08-21 MED ORDER — MELOXICAM 15 MG PO TABS: 15.0000 mg | ORAL_TABLET | Freq: Every day | ORAL | 0 refills | Status: AC

## 2024-08-21 NOTE — Patient Instructions (Addendum)
 Xrays on the way out   - Start meloxicam  15 mg daily x3 weeks. May use remaining NSAID as needed once daily for pain control.  Do not to use additional over-the-counter NSAIDs (ibuprofen , naproxen, Advil , Aleve, etc.) while taking prescription NSAIDs.  May use Tylenol  [phone] mg 2 to 3 times a day for breakthrough pain.   PT referral   6 week follow up

## 2024-08-21 NOTE — Progress Notes (Unsigned)
 "               Odis Mace D.[facility] [address] [place] [address] Phone: [phone]   Assessment and Plan:     ***    Pertinent previous records reviewed include ***   Follow Up: ***     Subjective:    Chief Complaint: Left shoulder pain   HPI:   07/24/2024 Patient is a 79 year old female with left shoulder pain. Patient states pain started 3 years ago. She fell a couple of weeks ago. Decreased ROM. Tylenol  and ibu for the pain. Pain radiates down to the elbow. She isnt able to sleep through the night. No numbness or tingling.     08/21/24 Today patient states the injection helped ***   Relevant Historical Information: Hypertension, CAD, GERD, CKD 3   Additional pertinent review of systems negative.  Current Medications[1]   Objective:     There were no vitals filed for this visit.    There is no height or weight on file to calculate BMI.    Physical Exam:    ***   Electronically signed by:  Odis Mace D.[facility] 10:04 AM 08/21/24    [1]  Current Outpatient Medications:    amoxicillin  (AMOXIL ) 500 MG capsule, Take 1 capsule (500 mg total) by mouth 3 (three) times daily. (Patient not taking: Reported on 07/09/2024), Disp: 21 capsule, Rfl: 1   Aspirin  81 MG CAPS, Take 1 tablet by mouth daily., Disp: , Rfl:    atorvastatin  (LIPITOR) 20 MG tablet, TAKE 1 TABLET BY MOUTH EVERY DAY, Disp: 90 tablet, Rfl: 3   bismuth subsalicylate (PEPTO BISMOL) 262 MG/15ML suspension, Take 30 mLs by mouth every 6 (six) hours as needed for indigestion., Disp: , Rfl:    carvedilol  (COREG ) 6.25 MG tablet, TAKE 1 TABLET BY MOUTH TWICE  DAILY WITH A MEAL, Disp: 200 tablet, Rfl: 2   cetirizine (ZYRTEC) 10 MG tablet, Take 10 mg by mouth at bedtime., Disp: , Rfl:    Cholecalciferol  1000 UNITS tablet, Take 2,000 Units by mouth daily after supper. Informed to start 2000 units daily, Disp: , Rfl:    Continuous Blood Gluc  Receiver (FREESTYLE LIBRE 2 READER) DEVI, 1 Units by Does not apply route every 14 (fourteen) days., Disp: 1 each, Rfl: 0   Continuous Blood Gluc Sensor (FREESTYLE LIBRE 2 SENSOR) MISC, 1 Units by Does not apply route every 14 (fourteen) days., Disp: 6 each, Rfl: 3   diclofenac  sodium (VOLTAREN ) 1 % GEL, Apply 2 g topically 4 (four) times daily. (Patient taking differently: Apply 2 g topically daily as needed (for pain).), Disp: 100 g, Rfl: 3   erythromycin  ophthalmic ointment, Place a 1/2 inch ribbon of ointment into the lower eyelid of the right eye four times daily., Disp: 3.5 g, Rfl: 0   FLUZONE HIGH-DOSE QUADRIVALENT 0.7 ML SUSY, , Disp: , Rfl:    glucose blood (ACCU-CHEK AVIVA) test strip, Use to check blood sugars twice a day, Disp: 100 each, Rfl: 5   ipratropium (ATROVENT) 0.03 % nasal spray, Place 2 sprays into both nostrils 2 (two) times daily as needed., Disp: , Rfl:    Lancets (ACCU-CHEK SOFT TOUCH) lancets, Use to check blood sugars twice a day, Disp: 100 each, Rfl: 5   levothyroxine  (SYNTHROID ) 100 MCG tablet, Take 100 mcg by mouth at bedtime., Disp: , Rfl:    LORazepam  (ATIVAN ) 0.5 MG tablet, Take 1 tablet (0.5  mg total) by mouth every 6 (six) hours as needed for anxiety (anxiety or panic feeling)., Disp: 30 tablet, Rfl: 0   pantoprazole  (PROTONIX ) 40 MG tablet, TAKE 1 TABLET BY MOUTH TWICE  DAILY, Disp: 200 tablet, Rfl: 2   valsartan  (DIOVAN ) 80 MG tablet, TAKE 1 TABLET BY MOUTH DAILY, Disp: 100 tablet, Rfl: 2  "

## 2024-09-03 ENCOUNTER — Ambulatory Visit: Payer: Medicare Other

## 2024-09-03 ENCOUNTER — Ambulatory Visit

## 2024-09-05 ENCOUNTER — Ambulatory Visit

## 2024-09-10 ENCOUNTER — Ambulatory Visit: Admitting: Podiatry

## 2024-09-20 ENCOUNTER — Ambulatory Visit: Admitting: Internal Medicine

## 2024-10-02 ENCOUNTER — Ambulatory Visit: Admitting: Sports Medicine
